# Patient Record
Sex: Male | Born: 1965 | Race: White | Hispanic: Yes | Marital: Married | Smoking: Former smoker
Health system: Southern US, Community
[De-identification: ages and names within clinical notes are randomized; demographics above are authoritative.]

## PROBLEM LIST (undated history)

## (undated) DIAGNOSIS — I2510 Atherosclerotic heart disease of native coronary artery without angina pectoris: Secondary | ICD-10-CM

## (undated) DIAGNOSIS — F3181 Bipolar II disorder: Secondary | ICD-10-CM

## (undated) DIAGNOSIS — F32A Depression, unspecified: Secondary | ICD-10-CM

## (undated) DIAGNOSIS — K760 Fatty (change of) liver, not elsewhere classified: Secondary | ICD-10-CM

## (undated) DIAGNOSIS — J45909 Unspecified asthma, uncomplicated: Secondary | ICD-10-CM

## (undated) DIAGNOSIS — I252 Old myocardial infarction: Secondary | ICD-10-CM

## (undated) DIAGNOSIS — I213 ST elevation (STEMI) myocardial infarction of unspecified site: Secondary | ICD-10-CM

## (undated) DIAGNOSIS — F419 Anxiety disorder, unspecified: Secondary | ICD-10-CM

## (undated) DIAGNOSIS — E119 Type 2 diabetes mellitus without complications: Secondary | ICD-10-CM

## (undated) HISTORY — PX: APPENDECTOMY: SHX54

## (undated) HISTORY — PX: NASAL SINUS SURGERY: SHX719

## (undated) HISTORY — DX: Fatty (change of) liver, not elsewhere classified: K76.0

## (undated) HISTORY — DX: ST elevation (STEMI) myocardial infarction of unspecified site: I21.3

## (undated) HISTORY — DX: Type 2 diabetes mellitus without complications: E11.9

## (undated) HISTORY — DX: Bipolar II disorder: F31.81

## (undated) HISTORY — PX: CORONARY ANGIOPLASTY: SHX604

## (undated) HISTORY — DX: Anxiety disorder, unspecified: F41.9

## (undated) HISTORY — DX: Old myocardial infarction: I25.2

## (undated) HISTORY — PX: FINGER SURGERY: SHX640

## (undated) HISTORY — DX: Atherosclerotic heart disease of native coronary artery without angina pectoris: I25.10

## (undated) HISTORY — DX: Unspecified asthma, uncomplicated: J45.909

---

## 2004-10-17 ENCOUNTER — Ambulatory Visit: Payer: Self-pay | Admitting: Family Medicine

## 2005-06-10 ENCOUNTER — Emergency Department: Payer: Self-pay | Admitting: Emergency Medicine

## 2005-06-16 ENCOUNTER — Ambulatory Visit: Payer: Self-pay

## 2005-08-20 ENCOUNTER — Other Ambulatory Visit: Payer: Self-pay

## 2005-08-20 ENCOUNTER — Emergency Department: Payer: Self-pay | Admitting: General Practice

## 2006-03-07 ENCOUNTER — Ambulatory Visit: Payer: Self-pay | Admitting: Gastroenterology

## 2007-08-06 ENCOUNTER — Other Ambulatory Visit: Payer: Self-pay

## 2007-08-06 ENCOUNTER — Observation Stay: Payer: Self-pay | Admitting: Internal Medicine

## 2007-10-17 ENCOUNTER — Ambulatory Visit: Payer: Self-pay | Admitting: Otolaryngology

## 2008-02-13 ENCOUNTER — Ambulatory Visit: Payer: Self-pay | Admitting: Family Medicine

## 2008-05-28 ENCOUNTER — Emergency Department: Payer: Self-pay | Admitting: Emergency Medicine

## 2008-06-18 DIAGNOSIS — J309 Allergic rhinitis, unspecified: Secondary | ICD-10-CM | POA: Insufficient documentation

## 2009-01-29 ENCOUNTER — Ambulatory Visit: Payer: Self-pay | Admitting: Family Medicine

## 2013-03-14 LAB — LIPID PANEL
Cholesterol: 248 mg/dL — AB (ref 0–200)
HDL: 38 mg/dL (ref 35–70)
LDL Cholesterol: 160 mg/dL
Triglycerides: 249 mg/dL — AB (ref 40–160)

## 2013-03-14 LAB — BASIC METABOLIC PANEL
BUN: 10 mg/dL (ref 4–21)
Creatinine: 0.9 mg/dL (ref 0.6–1.3)
Glucose: 110 mg/dL
Potassium: 4.7 mmol/L (ref 3.4–5.3)
Sodium: 143 mmol/L (ref 137–147)

## 2013-03-14 LAB — CBC AND DIFFERENTIAL
HCT: 49 % (ref 41–53)
Hemoglobin: 16.8 g/dL (ref 13.5–17.5)
Platelets: 269 10*3/uL (ref 150–399)
WBC: 9.1 10^3/mL

## 2013-03-14 LAB — HEPATIC FUNCTION PANEL
ALT: 14 U/L (ref 10–40)
AST: 14 U/L (ref 14–40)

## 2013-03-14 LAB — TSH: TSH: 1.85 u[IU]/mL (ref 0.41–5.90)

## 2013-06-22 DIAGNOSIS — I213 ST elevation (STEMI) myocardial infarction of unspecified site: Secondary | ICD-10-CM | POA: Insufficient documentation

## 2013-06-22 HISTORY — DX: ST elevation (STEMI) myocardial infarction of unspecified site: I21.3

## 2013-06-24 HISTORY — PX: CARDIAC CATHETERIZATION: SHX172

## 2013-07-07 ENCOUNTER — Ambulatory Visit (INDEPENDENT_AMBULATORY_CARE_PROVIDER_SITE_OTHER): Payer: BC Managed Care – PPO | Admitting: Cardiovascular Disease

## 2013-07-07 ENCOUNTER — Encounter (INDEPENDENT_AMBULATORY_CARE_PROVIDER_SITE_OTHER): Payer: Self-pay

## 2013-07-07 ENCOUNTER — Encounter: Payer: Self-pay | Admitting: Cardiovascular Disease

## 2013-07-07 VITALS — BP 115/72 | HR 66 | Ht 68.0 in | Wt 182.5 lb

## 2013-07-07 DIAGNOSIS — I10 Essential (primary) hypertension: Secondary | ICD-10-CM

## 2013-07-07 DIAGNOSIS — I2510 Atherosclerotic heart disease of native coronary artery without angina pectoris: Secondary | ICD-10-CM | POA: Insufficient documentation

## 2013-07-07 NOTE — Assessment & Plan Note (Signed)
Vincent Hall presents for second opinion regarding his coronary artery disease. He has had 2 cardiac catheterizations at [facility]. He has a rare metal stent and his proximal LAD. An attempt to stent his anomalous left complex artery was aborted when it appeared that they could not successfully stent the circumflex artery without jailing the right coronary artery.  It was recommended that he have coronary artery bypass grafting. He presents here for further evaluation and to seek a second opinion. Unfortunately, he did not bring his angiograms so we have no way to look at the films.  In my opinion I think he's doing very good hands. He's had several cardiologist and surgeon evaluate his situation. I think it we group in now with a second opinion would only serve to delay things. I've recommended he proceed with coronary artery bypass grafting. I'll be happy to see him following his surgical procedure.  He does have high cholesterol and will need followup for that.  He also continues to smoke some. I've strongly recommended that he stop smoking

## 2013-07-07 NOTE — Patient Instructions (Signed)
Your physician recommends that you schedule a follow-up appointment in: 2-3 months   Your physician recommends that you continue on your current medications as directed. Please refer to the Current Medication list given to you today.   

## 2013-07-07 NOTE — Progress Notes (Signed)
Perdido. Date of Birth  1966/03/02       [facility] Office [address], [place], [place]  [address]   [place], [place]  [address] [place]   Fax  [phone]     Fax [phone]  Problem List: 1. Coronary artery disease-status post Anterior  wall myocardial infarction  , status post bare-metal stenting of the LAD ,  CABG was recommneded .   2. hyperlipidemia 3. 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 MI on 3/15.   He originally thought that he had GERD.  The pain worsened and EMS took him to [facility].   He was found to have a critically tight LAD and moderate disease of the LCx.   The docors at [facility] had recommended CABG.       He has had some angina since his discharge.    Two days ago, he had recurrent angina, and was taken back to [facility].  Enzymes are negative.  ECG was normal.  He was release yesterday ( July 06, 2013).    He has appointments to see cardiology and also to the see CV surgeons in 2 weeks.    He has not been active.  He does walk on occasion.    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     Current Outpatient Prescriptions on File Prior to Visit  Medication Sig Dispense Refill  . albuterol (PROVENTIL HFA;VENTOLIN HFA) 108 (90 BASE) MCG/ACT inhaler Inhale 2 puffs into the lungs every 6 (six) hours as needed for wheezing or shortness of breath.      Marland Kitchen aspirin 81 MG tablet Take 81 mg by mouth daily.      Marland Kitchen atorvastatin (LIPITOR) 80 MG tablet Take 80 mg by mouth daily.      Marland Kitchen lisinopril (PRINIVIL,ZESTRIL) 2.5 MG tablet Take 2.5 mg by mouth daily.      . metoprolol succinate (TOPROL-XL) 25 MG 24 hr tablet Take 25 mg by mouth daily.      . nitroGLYCERIN 2.5 MG CR capsule Take 2.5 mg by mouth as needed.       Marland Kitchen omeprazole (PRILOSEC) 20  MG capsule Take 20 mg by mouth 2 (two) times daily before a meal.       . prasugrel (EFFIENT) 10 MG TABS tablet Take 10 mg by mouth daily.       No current facility-administered medications on file prior to visit.    No Known Allergies  Past Medical History  Diagnosis Date  . Coronary artery disease   . History of MI (myocardial infarction)   . GERD (gastroesophageal reflux disease)   . Hyperlipidemia     Past Surgical History  Procedure Laterality Date  . Nasal sinus surgery    . Appendectomy    . Cardiac catheterization  06/24/2013  . Coronary angioplasty      stent placement to LAD  . Finger surgery      History  Smoking status  . Current Every Day Smoker -- 0.25 packs/day for 30 years  . Types: Cigarettes  Smokeless tobacco  . Not on file    History  Alcohol Use No    Family History  Problem Relation Age of Onset  . Hyperlipidemia  Mother   . Heart attack Father   . Hyperlipidemia Father     Reviw of Systems:  Reviewed in the HPI.  All other systems are negative.  Physical Exam: Blood pressure 115/72, pulse 66, height 5\' 8"  (1.727 m), weight 182 lb 8 oz (82.781 kg). Wt Readings from Last 3 Encounters:  07/07/13 182 lb 8 oz (82.781 kg)     General: Well developed, well nourished, in no acute distress.  Head: Normocephalic, atraumatic, sclera non-icteric, mucus membranes are moist,   Neck: Supple. Carotids are 2 + without bruits. No JVD   Lungs: Clear   Heart: RR. Normal  s1S2  Abdomen: Soft, non-tender, non-distended with normal bowel sounds.  Msk:  Strength and tone are normal   Extremities: No clubbing or cyanosis. No edema.  Distal pedal pulses are 2+ and equal    Neuro: CN II - XII intact.  Alert and oriented X 3.   Psych:  Normal   ECG: 07/07/2013: Normal sinus rhythm at 66. His T-wave inversions in the inferior and lateral leads.  Assessment / Plan:

## 2013-07-31 HISTORY — PX: CORONARY ARTERY BYPASS GRAFT: SHX141

## 2013-08-22 ENCOUNTER — Ambulatory Visit: Payer: Self-pay | Admitting: Family Medicine

## 2013-09-11 ENCOUNTER — Ambulatory Visit: Payer: Self-pay | Admitting: Gastroenterology

## 2013-09-16 ENCOUNTER — Ambulatory Visit: Payer: BC Managed Care – PPO | Admitting: Cardiovascular Disease

## 2013-09-16 LAB — BASIC METABOLIC PANEL
BUN: 13 mg/dL (ref 4–21)
Creatinine: 0.8 mg/dL (ref 0.6–1.3)
Glucose: 102 mg/dL
Potassium: 4.8 mmol/L (ref 3.4–5.3)
Sodium: 140 mmol/L (ref 137–147)

## 2013-09-16 LAB — HEPATIC FUNCTION PANEL
ALT: 27 U/L (ref 10–40)
AST: 19 U/L (ref 14–40)

## 2013-09-16 LAB — CBC AND DIFFERENTIAL
HCT: 43 % (ref 41–53)
Hemoglobin: 14 g/dL (ref 13.5–17.5)
Platelets: 275 10*3/uL (ref 150–399)
WBC: 9.2 10^3/mL

## 2013-09-16 LAB — LIPID PANEL
Cholesterol: 180 mg/dL (ref 0–200)
HDL: 49 mg/dL (ref 35–70)
LDL Cholesterol: 93 mg/dL
Triglycerides: 191 mg/dL — AB (ref 40–160)

## 2013-09-29 ENCOUNTER — Ambulatory Visit: Payer: Self-pay | Admitting: Urgent Care

## 2014-02-06 ENCOUNTER — Ambulatory Visit: Payer: Self-pay | Admitting: Unknown Physician Specialty

## 2014-02-06 ENCOUNTER — Ambulatory Visit: Payer: Self-pay | Admitting: Urgent Care

## 2014-02-06 LAB — HEPATIC FUNCTION PANEL A (ARMC)
Albumin: 4.1 g/dL (ref 3.4–5.0)
Alkaline Phosphatase: 68 U/L
Bilirubin, Direct: <0.1 mg/dL (ref 0.0–0.2)
Bilirubin,Total: 0.4 mg/dL (ref 0.2–1.0)
SGOT(AST): 19 U/L (ref 15–37)
SGPT (ALT): 42 U/L
Total Protein: 7.2 g/dL (ref 6.4–8.2)

## 2014-02-17 ENCOUNTER — Ambulatory Visit: Payer: Self-pay | Admitting: Gastroenterology

## 2014-02-18 ENCOUNTER — Ambulatory Visit: Payer: Self-pay | Admitting: Family Medicine

## 2014-02-19 ENCOUNTER — Ambulatory Visit: Payer: Self-pay | Admitting: Family Medicine

## 2014-07-02 LAB — LIPID PANEL
Cholesterol: 178 mg/dL (ref 0–200)
HDL: 45 mg/dL (ref 35–70)
LDL Cholesterol: 84 mg/dL
Triglycerides: 244 mg/dL — AB (ref 40–160)

## 2014-07-02 LAB — BASIC METABOLIC PANEL
BUN: 12 mg/dL (ref 4–21)
Creatinine: 0.9 mg/dL (ref 0.6–1.3)
Glucose: 121 mg/dL
Potassium: 4.2 mmol/L (ref 3.4–5.3)
Sodium: 141 mmol/L (ref 137–147)

## 2014-07-02 LAB — CBC AND DIFFERENTIAL
HCT: 44 % (ref 41–53)
Hemoglobin: 14.9 g/dL (ref 13.5–17.5)
Platelets: 203 10*3/uL (ref 150–399)
WBC: 7.9 10^3/mL

## 2014-07-02 LAB — TSH: TSH: 2.31 u[IU]/mL (ref 0.41–5.90)

## 2014-07-02 LAB — HEPATIC FUNCTION PANEL
ALT: 27 U/L (ref 10–40)
AST: 16 U/L (ref 14–40)

## 2014-07-02 LAB — PSA: PSA: 0.2

## 2014-08-18 ENCOUNTER — Ambulatory Visit: Payer: BLUE CROSS/BLUE SHIELD | Attending: Family Medicine

## 2014-09-18 ENCOUNTER — Ambulatory Visit (INDEPENDENT_AMBULATORY_CARE_PROVIDER_SITE_OTHER): Payer: Commercial Managed Care - HMO | Admitting: Urgent Care

## 2014-09-18 ENCOUNTER — Other Ambulatory Visit: Payer: Self-pay | Admitting: Urgent Care

## 2014-09-18 ENCOUNTER — Encounter: Payer: Self-pay | Admitting: Urgent Care

## 2014-09-18 ENCOUNTER — Telehealth: Payer: Self-pay | Admitting: Family Medicine

## 2014-09-18 VITALS — BP 146/70 | HR 85 | Temp 98.1°F | Ht 67.0 in | Wt 197.4 lb

## 2014-09-18 DIAGNOSIS — K224 Dyskinesia of esophagus: Secondary | ICD-10-CM | POA: Insufficient documentation

## 2014-09-18 DIAGNOSIS — Z1211 Encounter for screening for malignant neoplasm of colon: Secondary | ICD-10-CM | POA: Diagnosis not present

## 2014-09-18 DIAGNOSIS — K5904 Chronic idiopathic constipation: Secondary | ICD-10-CM | POA: Insufficient documentation

## 2014-09-18 DIAGNOSIS — K5900 Constipation, unspecified: Secondary | ICD-10-CM | POA: Diagnosis not present

## 2014-09-18 DIAGNOSIS — K5909 Other constipation: Secondary | ICD-10-CM

## 2014-09-18 DIAGNOSIS — K219 Gastro-esophageal reflux disease without esophagitis: Secondary | ICD-10-CM | POA: Insufficient documentation

## 2014-09-18 MED ORDER — OXYCODONE-ACETAMINOPHEN 5-325 MG PO TABS: ORAL_TABLET | ORAL | Status: DC

## 2014-09-18 MED ORDER — DEXILANT 60 MG PO CPDR: 60.0000 mg | DELAYED_RELEASE_CAPSULE | Freq: Every day | ORAL | Status: DC

## 2014-09-18 MED ORDER — NA SULFATE-K SULFATE-MG SULF 17.5-3.13-1.6 GM/177ML PO SOLN: 1.0000 | ORAL | Status: DC

## 2014-09-18 MED ORDER — ALPRAZOLAM 0.5 MG PO TABS: ORAL_TABLET | ORAL | Status: DC

## 2014-09-18 NOTE — Progress Notes (Signed)
Gastroenterology Initial Patient Visit  Referring Provider:     Birdie Sons, MD Primary Care Physician:  [name], MD Primary Gastroenterologist:  Dr. Allen Norris          HPI:   Vincent Hall is a 49 y.o. y/o male established patient with GERD & esophageal spasms.  He has stopped taking his baclofen area he can tell me exactly how long ago he stopped. He is continue to take Decadron 60 mg daily. He is taking occasional oxycodone for pain. He says his swallowing is much better. However when he eats he is having bloating. Denies any severe abdominal pain. Denies any nausea, vomiting, or diarrhea. He is busy had negative gallbladder workup including ultrasound and HIDA scan. He has tried Linzess under 45 mg daily for his constipation and this does seem to help. He's never had a colonoscopy.  Past Medical History  Diagnosis Date  . Coronary artery disease   . History of MI (myocardial infarction)   . GERD (gastroesophageal reflux disease)   . Hyperlipidemia   . Dysphagia   . Epigastric pain   . Insomnia   . Arthralgia   . Fatty liver   . Diffuse esophageal spasm   . Asthma   . Coronary artery disease   . Myocardial infarction   . Anxiety     Past Surgical History  Procedure Laterality Date  . Nasal sinus surgery    . Appendectomy    . Cardiac catheterization  06/24/2013  . Coronary angioplasty      stent placement to LAD  . Finger surgery    . Coronary artery bypass graft  07/31/2013    x 3 vessels    Prior to Admission medications   Medication Sig Start Date End Date Taking? Authorizing Provider  albuterol (PROVENTIL HFA;VENTOLIN HFA) 108 (90 BASE) MCG/ACT inhaler Inhale 2 puffs into the lungs every 6 (six) hours as needed for wheezing or shortness of breath.   Yes Historical Provider, MD  aspirin 81 MG tablet Take 81 mg by mouth daily.   Yes Historical Provider, MD  CRESTOR 20 MG tablet Take 1 tablet by mouth daily. 09/06/14  Yes Historical Provider, MD  DEXILANT 60  MG capsule Take 1 capsule (60 mg total) by mouth daily. 09/18/14  Yes Andria Meuse, NP  lisinopril (PRINIVIL,ZESTRIL) 2.5 MG tablet Take 2.5 mg by mouth daily.   Yes Historical Provider, MD  metoprolol succinate (TOPROL-XL) 25 MG 24 hr tablet Take 25 mg by mouth daily.   Yes Historical Provider, MD  nitroGLYCERIN 2.5 MG CR capsule Take 2.5 mg by mouth as needed.    Yes Historical Provider, MD  oxyCODONE-acetaminophen (ROXICET) 5-325 MG per tablet 1/2 - 1 tablet twice daily as needed 09/18/14  Yes Birdie Sons, MD  Na Sulfate-K Sulfate-Mg Sulf (SUPREP BOWEL PREP) SOLN Take 1 kit by mouth as directed. 09/18/14   Andria Meuse, NP    Family History  Problem Relation Age of Onset  . Hyperlipidemia Mother   . Heart attack Father   . Hyperlipidemia Father      History   Social History Narrative   History  Substance Use Topics  . Smoking status: Current Every Day Smoker -- 0.25 packs/day for 30 years    Types: Cigarettes  . Smokeless tobacco: Not on file  . Alcohol Use: No    Allergies as of 09/18/2014 - Review Complete 09/18/2014  Allergen Reaction Noted  . Singulair [montelukast sodium]  09/18/2014  Review of Systems:    All systems reviewed and negative except where noted in HPI.   Physical Exam:  BP 146/70 mmHg  Pulse 85  Temp(Src) 98.1 F (36.7 C) (Oral)  Ht '5\' 7"'  (1.702 m)  Wt 197 lb 6.4 oz (89.54 kg)  BMI 30.91 kg/m2 No LMP for male patient. General:   Alert,  Well-developed, well-nourished, pleasant and cooperative in NAD Head:  Normocephalic and atraumatic. Eyes:  Sclera clear, no icterus.   Conjunctiva pink. Ears:  Normal auditory acuity. Nose:  No deformity, discharge, or lesions. Mouth:  No deformity or lesions,oropharynx pink & moist. Neck:  Supple; no masses or thyromegaly. Lungs:  Respirations even and unlabored.  Clear throughout to auscultation.   No wheezes, crackles, or rhonchi. No acute distress. Heart:  Regular rate and rhythm; no murmurs,  clicks, rubs, or gallops. Abdomen:  Normal bowel sounds.  No bruits.  Soft, non-tender and non-distended without masses, hepatosplenomegaly or hernias noted.  No guarding or rebound tenderness.  Negative Carnett sign.   Rectal:  Deferred.  Msk:  Symmetrical without gross deformities.  Good, equal movement & strength bilaterally. Pulses:  Normal pulses noted. Extremities:  No clubbing or edema.  No cyanosis. Neurologic:  Alert and oriented x3;  grossly normal neurologically. Skin:  Intact without significant lesions or rashes.  No jaundice. Lymph Nodes:  No significant cervical adenopathy. Psych:  Alert and cooperative. Normal mood and affect.  Imaging Studies: No results found.

## 2014-09-18 NOTE — Telephone Encounter (Signed)
Please refill.

## 2014-09-18 NOTE — Assessment & Plan Note (Signed)
Continue Dexilant 60 mg daily 

## 2014-09-18 NOTE — Telephone Encounter (Signed)
Rx printed

## 2014-09-18 NOTE — Assessment & Plan Note (Addendum)
I have discussed risks & benefits which include, but are not limited to, bleeding, infection, perforation & drug reaction.  The patient agrees with this plan & written consent will be obtained. Begin probiotic of choice VSL#3 twice daily or ALIGN 4mg  daily

## 2014-09-18 NOTE — Telephone Encounter (Signed)
Pt request the refill for Alprazolam .5 mg be sent to [facility] and would like to pick up the RX for Oxycodone 5-325 mg. Thanks TNP

## 2014-09-18 NOTE — Assessment & Plan Note (Signed)
Resume Amitiza 180mcg daily

## 2014-09-18 NOTE — Assessment & Plan Note (Signed)
Resume baclofen as directed

## 2014-09-18 NOTE — Patient Instructions (Signed)
Colonoscopy with Dr Allen Norris Resume Baclofen as directed Begin probiotic of choice VSL#3 twice daily or ALIGN 4mg  daily Continue Dexilant 60mg  daily Esophageal Spasm Esophageal spasm is an uncoordinated contraction of the muscles of the esophagus (the tube which carries food from your mouth to your stomach). Normally, the muscles of the esophagus alternate between contraction and relaxation starting from the top of the esophagus and working down to the bottom. This moves the food from the mouth to the stomach. In esophageal spasm, all the muscles contract at once. This causes pain and fails to move the food along. As a result, you may have trouble swallowing.  Women are more likely than men to have esophageal spasm. The cause of the spasms is not known. Sometimes eating hot or cold foods triggers the condition and this may be due to an overly sensitive esophagus. This is not an infectious disease and cannot be passed to others. SYMPTOMS  Symptoms of esophageal spasm may include: chest pain, burning or pain with swallowing, and difficulty swallowing.  DIAGNOSIS  Esophageal spasm can be diagnosed by a test called manometry (pressure studies of the esophagus). In this test, a special tube is inserted down the esophagus. The tube measures the muscle activity of the esophagus. Abnormal contractions mixed with normal movement helps confirm the diagnosis.  A person with a hypersensitive esophagus may be diagnosed by inflating a long balloon in the person's esophagus. If this causes the same symptoms, preventive methods may work. PREVENTION  Avoid hot or cold foods if that seems to be a trigger. PROGNOSIS  This condition does not go away, nor is treatment entirely satisfactory. Patients need to be careful of what they eat. They need to continue on medication if a useful one is found. Fortunately, the condition does not get progressively worse as time passes. Esophageal spasm does not usually lead to more serious  problems but sometimes the pain can be disabling. If a person becomes afraid to eat they may become malnourished and lose weight.  TREATMENT   A procedure in which instruments of increasing size are inserted through the esophagus to enlarge (dilate) it are used.  Medications that decrease acid-production of the stomach may be used such as proton-pump inhibitors or H2-blockers.  Medications of several types can be used to relax the muscles of the esophagus.  An individual with a hypersensitive esophagus sometimes improves with low doses of medications normally used for depression.  No treatment for esophageal spasm is effective for everyone. Often several approaches will be tried before one works. In many cases, the symptoms will improve, but will not go away completely.  For severe cases, relief is obtained two-thirds of the time by cutting the muscles along the entire length of the esophagus. This is a major surgical procedure.  Your symptoms are usually the best guide to how well the treatment for esophageal spasm works. SIDE EFFECTS OF TREATMENTS  Nitrates can cause headaches and low blood pressure.  Calcium channel blockers can cause:  Feeling sick to your stomach (nausea).  Constipation and other side effects.  Antidepressants can cause side effects that depend on the medication used. HOME CARE INSTRUCTIONS   Let your caregiver know if problems are getting worse, or if you get food stuck in your esophagus for longer than 1 hour or as directed and are unable to swallow liquid.  Take medications as directed and with permission of your caregiver. Ask about what to do if a medication seems to get stuck in  your esophagus. Only take over-the-counter or prescription medicines for pain, discomfort, or fever as directed by your caregiver.  Soft and liquid foods pass more easily than solid pieces. SEEK IMMEDIATE MEDICAL CARE IF:   You develop severe chest pain, especially if the pain  is crushing or pressure-like and spreads to the arms, back, neck, or jaw, or if you have sweating, nausea, or shortness of breath. THIS COULD BE AN EMERGENCY. Do not wait to see if the pain will go away. Get medical help at once. Call 911 or 0 (operator). DO NOT drive yourself to the hospital.  Your chest pain gets worse and does not go away with rest.  You have an attack of chest pain lasting longer than usual despite rest and treatment with the medications your physician has prescribed.  You wake from sleep with chest pain or shortness of breath.  You feel dizzy or faint.  You have chest pain, not typical of your usual pain, caused by your esophagus for which you originally saw your caregiver. MAKE SURE YOU:   Understand these instructions.  Will watch your condition.  Will get help right away if you are not doing well or get worse. Document Released: 06/17/2002 Document Revised: 06/19/2011 Document Reviewed: 06/20/2013 [facility] Patient Information 2015 [place], [place]. This information is not intended to replace advice given to you by your health care provider. Make sure you discuss any questions you have with your health care provider.

## 2014-09-18 NOTE — Progress Notes (Deleted)
Subjective:     Patient ID: Vincent Hall, male   DOB: December 13, 1965, 49 y.o.   MRN: [id number]  HPI   Review of Systems     Objective:   Physical Exam     Assessment:     ***    Plan:     ***

## 2014-09-22 NOTE — Discharge Instructions (Signed)

## 2014-09-23 ENCOUNTER — Ambulatory Visit: Payer: Commercial Managed Care - HMO | Admitting: Anesthesiology

## 2014-09-23 ENCOUNTER — Ambulatory Visit
Admission: RE | Admit: 2014-09-23 | Discharge: 2014-09-23 | Disposition: A | Discharge status: Home / Self Care | Payer: Commercial Managed Care - HMO | Source: Ambulatory Visit | Attending: Gastroenterology | Admitting: Gastroenterology

## 2014-09-23 ENCOUNTER — Encounter
Admission: RE | Disposition: A | Discharge status: Home / Self Care | Payer: Self-pay | Source: Ambulatory Visit | Attending: Gastroenterology

## 2014-09-23 ENCOUNTER — Other Ambulatory Visit: Payer: Self-pay | Admitting: Gastroenterology

## 2014-09-23 DIAGNOSIS — E785 Hyperlipidemia, unspecified: Secondary | ICD-10-CM | POA: Diagnosis not present

## 2014-09-23 DIAGNOSIS — Z8489 Family history of other specified conditions: Secondary | ICD-10-CM | POA: Insufficient documentation

## 2014-09-23 DIAGNOSIS — K760 Fatty (change of) liver, not elsewhere classified: Secondary | ICD-10-CM | POA: Insufficient documentation

## 2014-09-23 DIAGNOSIS — Z79899 Other long term (current) drug therapy: Secondary | ICD-10-CM | POA: Diagnosis not present

## 2014-09-23 DIAGNOSIS — K640 First degree hemorrhoids: Secondary | ICD-10-CM | POA: Diagnosis not present

## 2014-09-23 DIAGNOSIS — J45909 Unspecified asthma, uncomplicated: Secondary | ICD-10-CM | POA: Insufficient documentation

## 2014-09-23 DIAGNOSIS — Z888 Allergy status to other drugs, medicaments and biological substances status: Secondary | ICD-10-CM | POA: Insufficient documentation

## 2014-09-23 DIAGNOSIS — Z8249 Family history of ischemic heart disease and other diseases of the circulatory system: Secondary | ICD-10-CM | POA: Diagnosis not present

## 2014-09-23 DIAGNOSIS — Z87891 Personal history of nicotine dependence: Secondary | ICD-10-CM | POA: Diagnosis not present

## 2014-09-23 DIAGNOSIS — I2510 Atherosclerotic heart disease of native coronary artery without angina pectoris: Secondary | ICD-10-CM | POA: Diagnosis not present

## 2014-09-23 DIAGNOSIS — D123 Benign neoplasm of transverse colon: Secondary | ICD-10-CM | POA: Diagnosis not present

## 2014-09-23 DIAGNOSIS — K219 Gastro-esophageal reflux disease without esophagitis: Secondary | ICD-10-CM | POA: Diagnosis not present

## 2014-09-23 DIAGNOSIS — I252 Old myocardial infarction: Secondary | ICD-10-CM | POA: Insufficient documentation

## 2014-09-23 DIAGNOSIS — F419 Anxiety disorder, unspecified: Secondary | ICD-10-CM | POA: Insufficient documentation

## 2014-09-23 DIAGNOSIS — Z1211 Encounter for screening for malignant neoplasm of colon: Secondary | ICD-10-CM | POA: Diagnosis not present

## 2014-09-23 DIAGNOSIS — D125 Benign neoplasm of sigmoid colon: Secondary | ICD-10-CM | POA: Diagnosis not present

## 2014-09-23 HISTORY — PX: COLONOSCOPY WITH PROPOFOL: SHX5780

## 2014-09-23 HISTORY — PX: POLYPECTOMY: SHX5525

## 2014-09-23 SURGERY — COLONOSCOPY WITH PROPOFOL
Anesthesia: Monitor Anesthesia Care | Wound class: Contaminated

## 2014-09-23 MED ORDER — LACTATED RINGERS IV SOLN
INTRAVENOUS | Status: DC
  Administered 2014-09-23 (×2): via INTRAVENOUS

## 2014-09-23 MED ORDER — PROMETHAZINE HCL 25 MG/ML IJ SOLN: 6.2500 mg | INTRAMUSCULAR | Status: DC | PRN

## 2014-09-23 MED ORDER — LIDOCAINE HCL (CARDIAC) 20 MG/ML IV SOLN
INTRAVENOUS | Status: DC | PRN
  Administered 2014-09-23: 30 mg via INTRAVENOUS

## 2014-09-23 MED ORDER — SODIUM CHLORIDE 0.9 % IV SOLN
INTRAVENOUS | Status: DC
Start: 2014-09-23 — End: 2014-09-23

## 2014-09-23 MED ORDER — ONDANSETRON HCL 4 MG PO TABS
4.0000 mg | ORAL_TABLET | Freq: Once | ORAL | Status: AC
  Administered 2014-09-23: 4 mg via ORAL

## 2014-09-23 MED ORDER — PROPOFOL 10 MG/ML IV BOLUS
INTRAVENOUS | Status: DC | PRN
  Administered 2014-09-23 (×9): 20 mg via INTRAVENOUS

## 2014-09-23 MED ORDER — STERILE WATER FOR IRRIGATION IR SOLN
Status: DC | PRN
  Administered 2014-09-23: 08:00:00

## 2014-09-23 SURGICAL SUPPLY — 28 items

## 2014-09-23 NOTE — Transfer of Care (Signed)
Immediate Anesthesia Transfer of Care Note  Patient: Vincent Hall  Procedure(s) Performed: Procedure(s): COLONOSCOPY WITH PROPOFOL (N/A) POLYPECTOMY  Patient Location: PACU  Anesthesia Type: MAC  Level of Consciousness: awake, alert  and patient cooperative  Airway and Oxygen Therapy: Patient Spontanous Breathing and Patient connected to supplemental oxygen  Post-op Assessment: Post-op Vital signs reviewed, Patient's Cardiovascular Status Stable, Respiratory Function Stable, Patent Airway and No signs of Nausea or vomiting  Post-op Vital Signs: Reviewed and stable  Complications: No apparent anesthesia complications

## 2014-09-23 NOTE — Anesthesia Postprocedure Evaluation (Signed)
  Anesthesia Post-op Note  Patient: Vincent Hall, Vincent Hall  Procedure(s) Performed: Procedure(s): COLONOSCOPY WITH PROPOFOL (N/A) POLYPECTOMY  Anesthesia type:MAC  Patient location: PACU  Post pain: Pain level controlled  Post assessment: Post-op Vital signs reviewed, Patient's Cardiovascular Status Stable, Respiratory Function Stable, Patent Airway and No signs of Nausea or vomiting  Post vital signs: Reviewed and stable  Last Vitals:  Filed Vitals:   09/23/14 0915  BP: 123/83  Pulse: 60  Temp:   Resp:     Level of consciousness: awake, alert  and patient cooperative  Complications: No apparent anesthesia complications

## 2014-09-23 NOTE — H&P (Signed)
[facility]  [address], [place], [place] [address] Phone: [phone] Fax : [phone]  Primary Care Physician:  [name], MD Primary Gastroenterologist:  Dr. Allen Norris  Pre-Procedure History & Physical: HPI:  Vincent Hall is a 49 y.o. male is here for a screening colonoscopy.   Past Medical History  Diagnosis Date  . Coronary artery disease   . History of MI (myocardial infarction)   . GERD (gastroesophageal reflux disease)   . Hyperlipidemia   . Dysphagia   . Epigastric pain   . Insomnia   . Arthralgia   . Fatty liver   . Diffuse esophageal spasm   . Asthma   . Coronary artery disease   . Myocardial infarction   . Anxiety     Past Surgical History  Procedure Laterality Date  . Nasal sinus surgery    . Appendectomy    . Cardiac catheterization  06/24/2013  . Coronary angioplasty      stent placement to LAD  . Finger surgery    . Coronary artery bypass graft  07/31/2013    x 3 vessels    Prior to Admission medications   Medication Sig Start Date End Date Taking? Authorizing Provider  albuterol (PROVENTIL HFA;VENTOLIN HFA) 108 (90 BASE) MCG/ACT inhaler Inhale 2 puffs into the lungs every 6 (six) hours as needed for wheezing or shortness of breath.   Yes Historical Provider, MD  ALPRAZolam Duanne Moron) 0.5 MG tablet Take 0.5 mg by mouth as needed for anxiety.   Yes Historical Provider, MD  CRESTOR 20 MG tablet Take 1 tablet by mouth daily. 09/06/14  Yes Historical Provider, MD  DEXILANT 60 MG capsule Take 1 capsule (60 mg total) by mouth daily. 09/18/14  Yes Andria Meuse, NP  metoprolol succinate (TOPROL-XL) 25 MG 24 hr tablet Take 25 mg by mouth daily.   Yes Historical Provider, MD  Na Sulfate-K Sulfate-Mg Sulf (SUPREP BOWEL PREP) SOLN Take 1 kit by mouth as directed. 09/18/14  Yes Andria Meuse, NP  oxyCODONE-acetaminophen (ROXICET) 5-325 MG per tablet 1/2 - 1 tablet twice daily as needed 09/18/14  Yes Birdie Sons, MD  Probiotic Product  (SOLUBLE FIBER/PROBIOTICS PO) Take by mouth once.   Yes Historical Provider, MD  ranitidine (ZANTAC) 150 MG capsule Take 150 mg by mouth as needed for heartburn.   Yes Historical Provider, MD  aspirin 81 MG tablet Take 81 mg by mouth daily.    Historical Provider, MD  lisinopril (PRINIVIL,ZESTRIL) 2.5 MG tablet Take 2.5 mg by mouth daily.    Historical Provider, MD  nitroGLYCERIN 2.5 MG CR capsule Take 2.5 mg by mouth as needed.     Historical Provider, MD    Allergies as of 09/18/2014 - Review Complete 09/18/2014  Allergen Reaction Noted  . Singulair [montelukast sodium]  09/18/2014    Family History  Problem Relation Age of Onset  . Hyperlipidemia Mother   . Heart attack Father   . Hyperlipidemia Father     History   Social History  . Marital Status: Married    Spouse Name: N/A  . Number of Children: N/A  . Years of Education: N/A   Occupational History  . Not on file.   Social History Main Topics  . Smoking status: Former Smoker -- 0.25 packs/day for 30 years    Types: Cigarettes    Quit date: 08/08/2013  . Smokeless tobacco: Not on file  . Alcohol Use: No  . Drug Use: No  . Sexual Activity:  Not on file   Other Topics Concern  . Not on file   Social History Narrative    Review of Systems: See HPI, otherwise negative ROS  Physical Exam: BP 124/69 mmHg  Pulse 83  Temp(Src) 97.7 F (36.5 C) (Temporal)  Resp 16  Ht '5\' 7"'  (1.702 m)  Wt 193 lb (87.544 kg)  BMI 30.22 kg/m2  SpO2 97% General:   Alert,  pleasant and cooperative in NAD Head:  Normocephalic and atraumatic. Neck:  Supple; no masses or thyromegaly. Lungs:  Clear throughout to auscultation.    Heart:  Regular rate and rhythm. Abdomen:  Soft, nontender and nondistended. Normal bowel sounds, without guarding, and without rebound.   Neurologic:  Alert and  oriented x4;  grossly normal neurologically.  Impression/Plan: Vincent Hall is now here to undergo a screening colonoscopy.  Risks,  benefits, and alternatives regarding colonoscopy have been reviewed with the patient.  Questions have been answered.  All parties agreeable.

## 2014-09-23 NOTE — Anesthesia Preprocedure Evaluation (Signed)
Anesthesia Evaluation    Airway Mallampati: II  TM Distance: >3 FB Neck ROM: Full    Dental no notable dental hx.    Pulmonary asthma , former smoker,  No inhaler use recently breath sounds clear to auscultation  Pulmonary exam normal       Cardiovascular + CAD and + Past MI Normal cardiovascular examRhythm:Regular Rate:Normal  Mets 4+. No recent chest pain   Neuro/Psych Anxiety    GI/Hepatic GERD-  Controlled,  Endo/Other    Renal/GU      Musculoskeletal   Abdominal   Peds  Hematology   Anesthesia Other Findings   Reproductive/Obstetrics                             Anesthesia Physical Anesthesia Plan  ASA: II  Anesthesia Plan: MAC   Post-op Pain Management:    Induction: Intravenous  Airway Management Planned: Natural Airway  Additional Equipment:   Intra-op Plan:   Post-operative Plan: Extubation in OR  Informed Consent: I have reviewed the patients History and Physical, chart, labs and discussed the procedure including the risks, benefits and alternatives for the proposed anesthesia with the patient or authorized representative who has indicated his/her understanding and acceptance.   Dental advisory given  Plan Discussed with: CRNA  Anesthesia Plan Comments:         Anesthesia Quick Evaluation

## 2014-09-23 NOTE — Op Note (Signed)
[facility] Gastroenterology Patient Name: Vincent Hall Procedure Date: 09/23/2014 8:18 AM MRN: [id number] Account #: [id number] Date of Birth: 04/30/1965 Admit Type: Outpatient Age: 49 Room: [facility] OR ROOM 01 Gender: Male Note Status: Finalized Procedure:         Colonoscopy Indications:       Screening for colorectal malignant neoplasm Providers:         Lucilla Lame, MD Medicines:         Propofol per Anesthesia Complications:     No immediate complications. Procedure:         Pre-Anesthesia Assessment:                    - Prior to the procedure, a History and Physical was                     performed, and patient medications and allergies were                     reviewed. The patient's tolerance of previous anesthesia                     was also reviewed. The risks and benefits of the procedure                     and the sedation options and risks were discussed with the                     patient. All questions were answered, and informed consent                     was obtained. Prior Anticoagulants: The patient has taken                     no previous anticoagulant or antiplatelet agents. ASA                     Grade Assessment: II - A patient with mild systemic                     disease. After reviewing the risks and benefits, the                     patient was deemed in satisfactory condition to undergo                     the procedure.                    After obtaining informed consent, the colonoscope was                     passed under direct vision. Throughout the procedure, the                     patient's blood pressure, pulse, and oxygen saturations                     were monitored continuously. The Olympus CF H180AL                     colonoscope (S#: [id number]) was introduced through the anus                     and advanced to the the cecum, identified by  appendiceal                     orifice and ileocecal valve. The  colonoscopy was performed                     without difficulty. The patient tolerated the procedure                     well. The quality of the bowel preparation was excellent. Findings:      The perianal and digital rectal examinations were normal.      Three sessile polyps were found in the sigmoid colon. The polyps were 2       to 3 mm in size. These polyps were removed with a cold biopsy forceps.       Resection and retrieval were complete.      A 6 mm polyp was found in the transverse colon. The polyp was sessile.       The polyp was removed with a cold snare. Resection and retrieval were       complete.      Non-bleeding internal hemorrhoids were found during retroflexion. The       hemorrhoids were Grade I (internal hemorrhoids that do not prolapse). Impression:        - Three 2 to 3 mm polyps in the sigmoid colon. Resected                     and retrieved.                    - One 6 mm polyp in the transverse colon. Resected and                     retrieved.                    - Non-bleeding internal hemorrhoids. Recommendation:    - Await pathology results.                    - Repeat colonoscopy in 5 years if polyp adenoma and 10                     years if hyperplastic Procedure Code(s): --- Professional ---                    [phone], Colonoscopy, flexible; with removal of tumor(s),                     polyp(s), or other lesion(s) by snare technique                    [id number], 31, Colonoscopy, flexible; with biopsy, single or                     multiple Diagnosis Code(s): --- Professional ---                    Z12.11, Encounter for screening for malignant neoplasm of                     colon                    D12.5, Benign neoplasm of sigmoid colon  D12.3, Benign neoplasm of transverse colon CPT copyright 2014 American Medical Association. All rights reserved. The codes documented in this report are preliminary and upon coder review may  be revised  to meet current compliance requirements. Lucilla Lame, MD 09/23/2014 8:46:07 AM This report has been signed electronically. Number of Addenda: 0 Note Initiated On: 09/23/2014 8:18 AM Scope Withdrawal Time: 0 hours 6 minutes 52 seconds  Total Procedure Duration: 0 hours 16 minutes 18 seconds       [facility]

## 2014-09-24 ENCOUNTER — Encounter: Payer: Self-pay | Admitting: Gastroenterology

## 2014-10-06 ENCOUNTER — Encounter: Payer: Self-pay | Admitting: Gastroenterology

## 2014-10-20 ENCOUNTER — Telehealth: Payer: Self-pay | Admitting: Family Medicine

## 2014-10-20 NOTE — Telephone Encounter (Signed)
Pt called for his wife.  He wants her to re-establish care with [place].  He wants her to see you for a physical.  She was in in 2012 and seen Dr. Loletha Grayer.  Thanks, TP   His call back is [phone].

## 2014-10-28 ENCOUNTER — Ambulatory Visit (INDEPENDENT_AMBULATORY_CARE_PROVIDER_SITE_OTHER): Payer: Commercial Managed Care - HMO | Admitting: Family Medicine

## 2014-10-28 ENCOUNTER — Encounter: Payer: Self-pay | Admitting: *Deleted

## 2014-10-28 ENCOUNTER — Encounter: Payer: Self-pay | Admitting: Family Medicine

## 2014-10-28 VITALS — BP 104/64 | HR 60 | Temp 98.3°F | Resp 16 | Wt 197.0 lb

## 2014-10-28 DIAGNOSIS — I252 Old myocardial infarction: Secondary | ICD-10-CM | POA: Insufficient documentation

## 2014-10-28 DIAGNOSIS — F410 Panic disorder [episodic paroxysmal anxiety] without agoraphobia: Secondary | ICD-10-CM | POA: Diagnosis not present

## 2014-10-28 DIAGNOSIS — S29012A Strain of muscle and tendon of back wall of thorax, initial encounter: Secondary | ICD-10-CM | POA: Insufficient documentation

## 2014-10-28 DIAGNOSIS — J309 Allergic rhinitis, unspecified: Secondary | ICD-10-CM

## 2014-10-28 DIAGNOSIS — G4700 Insomnia, unspecified: Secondary | ICD-10-CM

## 2014-10-28 DIAGNOSIS — F419 Anxiety disorder, unspecified: Secondary | ICD-10-CM | POA: Insufficient documentation

## 2014-10-28 DIAGNOSIS — E782 Mixed hyperlipidemia: Secondary | ICD-10-CM | POA: Insufficient documentation

## 2014-10-28 DIAGNOSIS — K219 Gastro-esophageal reflux disease without esophagitis: Secondary | ICD-10-CM | POA: Diagnosis not present

## 2014-10-28 DIAGNOSIS — M79606 Pain in leg, unspecified: Secondary | ICD-10-CM | POA: Insufficient documentation

## 2014-10-28 DIAGNOSIS — R5383 Other fatigue: Secondary | ICD-10-CM | POA: Insufficient documentation

## 2014-10-28 DIAGNOSIS — R0789 Other chest pain: Secondary | ICD-10-CM | POA: Insufficient documentation

## 2014-10-28 DIAGNOSIS — Z792 Long term (current) use of antibiotics: Secondary | ICD-10-CM | POA: Insufficient documentation

## 2014-10-28 DIAGNOSIS — M542 Cervicalgia: Secondary | ICD-10-CM | POA: Insufficient documentation

## 2014-10-28 DIAGNOSIS — R1310 Dysphagia, unspecified: Secondary | ICD-10-CM | POA: Insufficient documentation

## 2014-10-28 DIAGNOSIS — Z87891 Personal history of nicotine dependence: Secondary | ICD-10-CM | POA: Insufficient documentation

## 2014-10-28 DIAGNOSIS — M2550 Pain in unspecified joint: Secondary | ICD-10-CM | POA: Insufficient documentation

## 2014-10-28 MED ORDER — ALPRAZOLAM 0.5 MG PO TABS
0.5000 mg | ORAL_TABLET | Freq: Two times a day (BID) | ORAL | Status: DC | PRN
Start: 2014-10-28 — End: 2015-01-24

## 2014-10-28 MED ORDER — SERTRALINE HCL 50 MG PO TABS: ORAL_TABLET | ORAL | Status: DC

## 2014-10-28 NOTE — Progress Notes (Signed)
Patient: Vincent Hall Male    DOB: 12/24/1965   49 y.o.   MRN: [id number] Visit Date: 10/28/2014  Today's Provider: [name], MD   Chief Complaint  Patient presents with  . Anxiety   Subjective:    Anxiety The problem has been gradually worsening. Symptoms include confusion, depressed mood, dizziness, dry mouth, excessive worry, hyperventilation, insomnia, irritability, malaise, muscle tension, nausea, nervous/anxious behavior, palpitations, panic, restlessness, shortness of breath and suicidal ideas. Patient reports no chest pain, decreased concentration or feeling of choking. Symptoms occur constantly (sometimes occurs up to 3 times daily). The severity of symptoms is severe. The quality of sleep is poor.   Treatments tried: Xanax and  Ambien. The treatment provided mild (Xanax has helped with sleeping) relief. Compliance with prior treatments has been good.   States he was in [place] a few weeks ago and had severe panic attack. Had to go to ER. Has been very anxious and irritable lately.Has been very fatigued during the day, and is not sleeping well at night. Sometimes takes a xanax when he can't sleep.  He had been on sertraline for depression, but stopped a month or so ago since depression was better.     Allergies  Allergen Reactions  . Singulair [Montelukast Sodium] Hives   Previous Medications   ALBUTEROL (PROVENTIL HFA;VENTOLIN HFA) 108 (90 BASE) MCG/ACT INHALER    Inhale 2 puffs into the lungs every 6 (six) hours as needed for wheezing or shortness of breath.   ALPRAZOLAM (XANAX) 0.5 MG TABLET    Take 0.5 mg by mouth as needed for anxiety.   ASPIRIN 81 MG TABLET    Take 81 mg by mouth daily.   AZELASTINE (OPTIVAR) 0.05 % OPHTHALMIC SOLUTION    Apply to eye.   CRESTOR 20 MG TABLET    Take 1 tablet by mouth daily.   DEXILANT 60 MG CAPSULE    Take 1 capsule (60 mg total) by mouth daily.   LISINOPRIL (PRINIVIL,ZESTRIL) 2.5 MG TABLET    Take 2.5 mg by mouth daily.    METOPROLOL SUCCINATE (TOPROL-XL) 25 MG 24 HR TABLET    Take 25 mg by mouth daily.   NITROGLYCERIN (NITROSTAT) 0.4 MG SL TABLET    Place under the tongue.   OXYCODONE-ACETAMINOPHEN (ROXICET) 5-325 MG PER TABLET    1/2 - 1 tablet twice daily as needed   PROBIOTIC PRODUCT (SOLUBLE FIBER/PROBIOTICS PO)    Take by mouth once.   RANITIDINE (ZANTAC) 150 MG CAPSULE    Take 150 mg by mouth as needed for heartburn.   SERTRALINE (ZOLOFT) 50 MG TABLET    Take by mouth.   TIZANIDINE (ZANAFLEX) 4 MG TABLET    Take by mouth.   ZOLPIDEM (AMBIEN) 5 MG TABLET    Take by mouth.    Review of Systems  Constitutional: Positive for irritability.  Respiratory: Positive for shortness of breath.   Cardiovascular: Positive for palpitations. Negative for chest pain.  Gastrointestinal: Positive for nausea.  Neurological: Positive for dizziness.  Psychiatric/Behavioral: Positive for suicidal ideas and confusion. Negative for decreased concentration. The patient is nervous/anxious and has insomnia.     History  Substance Use Topics  . Smoking status: Former Smoker -- 0.25 packs/day for 30 years    Types: Cigarettes    Quit date: 08/08/2013  . Smokeless tobacco: Not on file  . Alcohol Use: No   Objective:   BP 104/64 mmHg  Pulse 60  Temp(Src) 98.3 F (36.8 C) (  Oral)  Resp 16  Wt 197 lb (89.359 kg)  SpO2 98%  Physical Exam   General Appearance:    Alert, cooperative, no distress  Eyes:    PERRL, conjunctiva/corneas clear, EOM's intact       Lungs:     Clear to auscultation bilaterally, respirations unlabored  Heart:    Regular rate and rhythm  Neurologic:   Awake, alert, oriented x 3. No apparent focal neurological           defect.           Assessment & Plan:      1. Panic attacks Has been much worse since stopping sertraline a few months since depression was better. Counseled patient that this is an effective antianxiety medications that should control panic attacks if at higher doses.  Restart sertraline and titrate as below. May take alprazolam prn while titration sertraline.  - sertraline (ZOLOFT) 50 MG tablet; 1/2 tablet a day for 6 days, then 1  tablet a day for 6 days, then 1 1/2 tablet daily for 6 days, then 2 daily.  Dispense: 30 tablet; Refill: 1 - ALPRAZolam (XANAX) 0.5 MG tablet; Take 1 tablet (0.5 mg total) by mouth 2 (two) times daily as needed for anxiety.  Dispense: 60 tablet; Refill: 1  2. Insomnia Has stopped [place] which he did not find to be effective. OK to take an alprazolam hs.  - sertraline (ZOLOFT) 50 MG tablet; 1/2 tablet a day for 6 days, then 1  tablet a day for 6 days, then 1 1/2 tablet daily for 6 days, then 2 daily.  Dispense: 30 tablet; Refill: 1 - ALPRAZolam (XANAX) 0.5 MG tablet; Take 1 tablet (0.5 mg total) by mouth 2 (two) times daily as needed for anxiety.  Dispense: 60 tablet; Refill: 1  3. Allergies He states allergies have been much worse and not controlled with OTC medications. He was previously seen at [facility] and requests referral for re-evaluation.   Return in about 4 weeks (around 11/25/2014).       [name], MD  [facility]

## 2014-11-18 ENCOUNTER — Other Ambulatory Visit: Payer: Self-pay | Admitting: Family Medicine

## 2014-11-18 ENCOUNTER — Ambulatory Visit (INDEPENDENT_AMBULATORY_CARE_PROVIDER_SITE_OTHER): Payer: Commercial Managed Care - HMO | Admitting: Family Medicine

## 2014-11-18 ENCOUNTER — Encounter: Payer: Self-pay | Admitting: Family Medicine

## 2014-11-18 ENCOUNTER — Ambulatory Visit: Payer: Commercial Managed Care - HMO | Admitting: Family Medicine

## 2014-11-18 VITALS — BP 110/60 | HR 59 | Temp 98.4°F | Resp 16 | Wt 198.0 lb

## 2014-11-18 DIAGNOSIS — F410 Panic disorder [episodic paroxysmal anxiety] without agoraphobia: Secondary | ICD-10-CM

## 2014-11-18 DIAGNOSIS — F419 Anxiety disorder, unspecified: Secondary | ICD-10-CM | POA: Diagnosis not present

## 2014-11-18 DIAGNOSIS — G4700 Insomnia, unspecified: Secondary | ICD-10-CM

## 2014-11-18 MED ORDER — SERTRALINE HCL 50 MG PO TABS: ORAL_TABLET | ORAL | Status: DC

## 2014-11-18 NOTE — Progress Notes (Signed)
Patient: Vincent Hall Male    DOB: 1965/10/07   49 y.o.   MRN: [id number] Visit Date: 11/18/2014  Today's Provider: [name], MD   Chief Complaint  Patient presents with  . Panic Attack    follow up  . Insomnia    follow up   Subjective:    HPI  Panic attack Follow up: Patient was last seen 3 weeks ago. Management at that visit includes restarting Sertraline and advising patient to titrate up. Patient was also advised that he could take Alprazolam as needed while titrating Sertraline. Today patient comes in reporting that the Panic attacks have improved. Patient is currently taking Sertraline 50mg  1 tablet daily. Patient states he has not had any episodes of  panic attacks since the last visit. Patient reports good compliance with treatment, good tolerance and good symptom control.    Insomnia Follow up: Last office visit was 3 weeks ago. During that visit patient was advised that he could take Alprazolam at night to help with sleeping. Patient reports good compliance with treatment, good tolerance and good symptom control. Patient states he has been taking 1 pill of Alprazolam 30 minutes before bedtime. Patient states his sleep has improved and he has been able to sleep 7-8 hours per night.     Allergies  Allergen Reactions  . Singulair [Montelukast Sodium] Hives   Previous Medications   ALBUTEROL (PROVENTIL HFA;VENTOLIN HFA) 108 (90 BASE) MCG/ACT INHALER    Inhale 2 puffs into the lungs every 6 (six) hours as needed for wheezing or shortness of breath.   ALPRAZOLAM (XANAX) 0.5 MG TABLET    Take 1 tablet (0.5 mg total) by mouth 2 (two) times daily as needed for anxiety.   ASPIRIN 81 MG TABLET    Take 81 mg by mouth daily.   AZELASTINE (OPTIVAR) 0.05 % OPHTHALMIC SOLUTION    Apply to eye.   CRESTOR 20 MG TABLET    Take 1 tablet by mouth daily.   DEXILANT 60 MG CAPSULE    Take 1 capsule (60 mg total) by mouth daily.   METOPROLOL SUCCINATE (TOPROL-XL) 25 MG 24 HR  TABLET    Take 25 mg by mouth daily.   NITROGLYCERIN (NITROSTAT) 0.4 MG SL TABLET    Place under the tongue.   OXYCODONE-ACETAMINOPHEN (ROXICET) 5-325 MG PER TABLET    1/2 - 1 tablet twice daily as needed   PROBIOTIC PRODUCT (SOLUBLE FIBER/PROBIOTICS PO)    Take by mouth once.   RANITIDINE (ZANTAC) 150 MG CAPSULE    Take 150 mg by mouth as needed for heartburn.   SERTRALINE (ZOLOFT) 50 MG TABLET    1/2 tablet a day for 6 days, then 1  tablet a day for 6 days, then 1 1/2 tablet daily for 6 days, then 2 daily.   TIZANIDINE (ZANAFLEX) 4 MG TABLET    Take by mouth.    Review of Systems  Constitutional: Negative for fever, chills and appetite change.  Respiratory: Negative for chest tightness, shortness of breath and wheezing.   Cardiovascular: Negative for chest pain and palpitations.  Gastrointestinal: Negative for nausea, vomiting and abdominal pain.    Social History  Substance Use Topics  . Smoking status: Former Smoker -- 0.25 packs/day for 30 years    Types: Cigarettes    Quit date: 08/08/2013  . Smokeless tobacco: Not on file  . Alcohol Use: No   Objective:   BP 110/60 mmHg  Pulse 59  Temp(Src) 98.4  F (36.9 C) (Oral)  Resp 16  Wt 198 lb (89.812 kg)  SpO2 96%  Physical Exam  General appearance: alert, well developed, well nourished, cooperative and in no distress Head: Normocephalic, without obvious abnormality, atraumatic Lungs: Respirations even and unlabored Extremities: No gross deformities Skin: Skin color, texture, turgor normal. No rashes seen  Psych: Appropriate mood and affect. Neurologic: Mental status: Alert, oriented to person, place, and time, thought content appropriate.     Assessment & Plan:     1. Anxiety Much better since starting sertraline. Will continue 50mg  for now and is to call if it seems to lose effectiveness.   2. Insomnia Much better, currently taking 0.5mg  alprazolam and advised he should try taking 1/2 tablet or none at all at times,  since his anxiety is much improved.   3. Panic attacks Resolved since starting sertraline. Call if anymore panic attacks. May take alprazolam prn if he does have one.   Return in about 4 months (around 03/20/2015).      [name], MD  [facility]

## 2014-11-19 ENCOUNTER — Other Ambulatory Visit: Payer: Self-pay | Admitting: Family Medicine

## 2014-11-19 DIAGNOSIS — G4700 Insomnia, unspecified: Secondary | ICD-10-CM

## 2014-11-19 DIAGNOSIS — F410 Panic disorder [episodic paroxysmal anxiety] without agoraphobia: Secondary | ICD-10-CM

## 2014-11-19 MED ORDER — SERTRALINE HCL 50 MG PO TABS: ORAL_TABLET | ORAL | Status: DC

## 2014-12-23 ENCOUNTER — Other Ambulatory Visit: Payer: Self-pay | Admitting: Family Medicine

## 2014-12-23 MED ORDER — METOPROLOL SUCCINATE ER 25 MG PO TB24: 12.5000 mg | ORAL_TABLET | Freq: Every day | ORAL | Status: DC

## 2014-12-23 NOTE — Telephone Encounter (Signed)
Pt contacted office for refill request on the following medications:  metoprolol succinate (TOPROL-XL) 25 MG 24 hr tablet.  [name]  [phone]

## 2014-12-23 NOTE — Telephone Encounter (Signed)
Refill request for Metoprolol 25 mg 1/2 tab qd Last filled by MD on- 10/18/2013 #30 x0 refills Last Appt: 11/18/2014 Next Appt: 03/16/2015 Please advise refill?

## 2015-01-24 ENCOUNTER — Other Ambulatory Visit: Payer: Self-pay | Admitting: Family Medicine

## 2015-01-26 ENCOUNTER — Other Ambulatory Visit: Payer: Self-pay | Admitting: Family Medicine

## 2015-01-27 NOTE — Telephone Encounter (Signed)
Rx called in to pharmacy. 

## 2015-01-27 NOTE — Telephone Encounter (Signed)
Please call in alprazolam.  

## 2015-02-22 ENCOUNTER — Ambulatory Visit
Admission: RE | Admit: 2015-02-22 | Discharge: 2015-02-22 | Disposition: A | Discharge status: Home / Self Care | Payer: Commercial Managed Care - HMO | Source: Ambulatory Visit | Attending: Family Medicine | Admitting: Family Medicine

## 2015-02-22 ENCOUNTER — Encounter: Payer: Self-pay | Admitting: Family Medicine

## 2015-02-22 ENCOUNTER — Ambulatory Visit (INDEPENDENT_AMBULATORY_CARE_PROVIDER_SITE_OTHER): Payer: Commercial Managed Care - HMO | Admitting: Family Medicine

## 2015-02-22 VITALS — BP 110/64 | HR 56 | Temp 98.0°F | Resp 16 | Wt 203.0 lb

## 2015-02-22 DIAGNOSIS — M5136 Other intervertebral disc degeneration, lumbar region: Secondary | ICD-10-CM | POA: Diagnosis not present

## 2015-02-22 DIAGNOSIS — M545 Low back pain, unspecified: Secondary | ICD-10-CM

## 2015-02-22 DIAGNOSIS — R072 Precordial pain: Secondary | ICD-10-CM | POA: Diagnosis not present

## 2015-02-22 MED ORDER — OXYCODONE-ACETAMINOPHEN 5-325 MG PO TABS: ORAL_TABLET | ORAL | Status: DC
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
## 2015-02-23 ENCOUNTER — Ambulatory Visit: Payer: Commercial Managed Care - HMO | Admitting: Family Medicine

## 2015-02-25 ENCOUNTER — Other Ambulatory Visit: Payer: Self-pay | Admitting: Family Medicine

## 2015-02-26 NOTE — Telephone Encounter (Signed)
Please call in alprazolam.  

## 2015-02-26 NOTE — Telephone Encounter (Signed)
Done. Prescription called into pharmacy.  

## 2015-03-01 ENCOUNTER — Telehealth: Payer: Self-pay | Admitting: *Deleted

## 2015-03-01 MED ORDER — NAPROXEN 500 MG PO TABS: 500.0000 mg | ORAL_TABLET | Freq: Two times a day (BID) | ORAL | Status: DC

## 2015-03-01 NOTE — Telephone Encounter (Signed)
-----   Message from Birdie Sons, MD sent at 02/23/2015  4:04 PM EST ----- Chest Xray is normal. Xray of spine shows moderate amount or arthritis. Recommend naprosyn 500mg  one tablet twice a day for 14 days, #38, rf x 0.

## 2015-03-01 NOTE — Telephone Encounter (Signed)
Patient notified of results. Patient expressed understanding. Rx sent to pharmacy.  

## 2015-03-16 ENCOUNTER — Ambulatory Visit: Payer: Commercial Managed Care - HMO | Admitting: Family Medicine

## 2015-03-28 ENCOUNTER — Other Ambulatory Visit: Payer: Self-pay | Admitting: Gastroenterology

## 2015-03-29 ENCOUNTER — Other Ambulatory Visit: Payer: Self-pay | Admitting: Family Medicine

## 2015-03-29 DIAGNOSIS — M545 Low back pain, unspecified: Secondary | ICD-10-CM

## 2015-03-29 MED ORDER — OXYCODONE-ACETAMINOPHEN 5-325 MG PO TABS: ORAL_TABLET | ORAL | Status: DC

## 2015-03-29 NOTE — Telephone Encounter (Signed)
Pt contacted office for refill request on the following medications:  oxyCODONE-acetaminophen (ROXICET) 5-325 MG tablet.  [id number]  Pt is leaving to go out of town tomorrow and will need to pick this up today/MJ

## 2015-05-26 ENCOUNTER — Other Ambulatory Visit: Payer: Self-pay | Admitting: Family Medicine

## 2015-05-26 DIAGNOSIS — M545 Low back pain, unspecified: Secondary | ICD-10-CM

## 2015-05-26 DIAGNOSIS — F410 Panic disorder [episodic paroxysmal anxiety] without agoraphobia: Secondary | ICD-10-CM

## 2015-05-26 DIAGNOSIS — G4700 Insomnia, unspecified: Secondary | ICD-10-CM

## 2015-05-26 MED ORDER — SERTRALINE HCL 50 MG PO TABS: ORAL_TABLET | ORAL | Status: DC

## 2015-05-26 MED ORDER — OXYCODONE-ACETAMINOPHEN 5-325 MG PO TABS: ORAL_TABLET | ORAL | Status: DC

## 2015-05-26 NOTE — Telephone Encounter (Signed)
Pt contacted office for refill request on the following medications: 1. oxyCODONE-acetaminophen (ROXICET) 5-325 MG tablet  2. sertraline (ZOLOFT) 50 MG tablet  Pt stated he can pick both RXs up since the pain medication has to be printed anyway. Pt also wanted to let Dr. Caryn Section know that he has an appt with the new Cardiologist 06/03/15. Thanks TNP

## 2015-07-02 ENCOUNTER — Ambulatory Visit (INDEPENDENT_AMBULATORY_CARE_PROVIDER_SITE_OTHER): Payer: Commercial Managed Care - HMO | Admitting: Family Medicine

## 2015-07-02 ENCOUNTER — Encounter: Payer: Self-pay | Admitting: Family Medicine

## 2015-07-02 VITALS — BP 104/62 | HR 72 | Temp 97.6°F | Resp 16 | Wt 188.0 lb

## 2015-07-02 DIAGNOSIS — R208 Other disturbances of skin sensation: Secondary | ICD-10-CM

## 2015-07-02 DIAGNOSIS — R200 Anesthesia of skin: Secondary | ICD-10-CM

## 2015-07-02 DIAGNOSIS — E782 Mixed hyperlipidemia: Secondary | ICD-10-CM

## 2015-07-02 DIAGNOSIS — R079 Chest pain, unspecified: Secondary | ICD-10-CM

## 2015-07-02 DIAGNOSIS — M545 Low back pain, unspecified: Secondary | ICD-10-CM

## 2015-07-02 DIAGNOSIS — R0789 Other chest pain: Secondary | ICD-10-CM

## 2015-07-02 MED ORDER — OXYCODONE-ACETAMINOPHEN 5-325 MG PO TABS: ORAL_TABLET | ORAL | Status: DC

## 2015-07-02 MED ORDER — CRESTOR 20 MG PO TABS: 20.0000 mg | ORAL_TABLET | Freq: Every day | ORAL | Status: DC

## 2015-07-02 MED ORDER — ROSUVASTATIN CALCIUM 20 MG PO TABS: 20.0000 mg | ORAL_TABLET | Freq: Every day | ORAL | Status: DC

## 2015-07-02 NOTE — Patient Instructions (Signed)
Go to the [facility] on [address] for [employer] 
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
## 2015-07-05 ENCOUNTER — Encounter: Payer: Self-pay | Admitting: Family Medicine

## 2015-07-05 ENCOUNTER — Ambulatory Visit
Admission: RE | Admit: 2015-07-05 | Discharge: 2015-07-05 | Disposition: A | Discharge status: Home / Self Care | Payer: Commercial Managed Care - HMO | Source: Ambulatory Visit | Attending: Family Medicine | Admitting: Family Medicine

## 2015-07-05 ENCOUNTER — Ambulatory Visit (INDEPENDENT_AMBULATORY_CARE_PROVIDER_SITE_OTHER): Payer: Commercial Managed Care - HMO | Admitting: Family Medicine

## 2015-07-05 VITALS — BP 110/88 | HR 62 | Temp 97.7°F | Resp 14 | Wt 187.0 lb

## 2015-07-05 DIAGNOSIS — R739 Hyperglycemia, unspecified: Secondary | ICD-10-CM

## 2015-07-05 DIAGNOSIS — R3589 Other polyuria: Secondary | ICD-10-CM

## 2015-07-05 DIAGNOSIS — E119 Type 2 diabetes mellitus without complications: Secondary | ICD-10-CM | POA: Diagnosis not present

## 2015-07-05 DIAGNOSIS — R079 Chest pain, unspecified: Secondary | ICD-10-CM | POA: Diagnosis not present

## 2015-07-05 DIAGNOSIS — R0789 Other chest pain: Secondary | ICD-10-CM

## 2015-07-05 DIAGNOSIS — R358 Other polyuria: Secondary | ICD-10-CM | POA: Diagnosis not present

## 2015-07-05 DIAGNOSIS — Z951 Presence of aortocoronary bypass graft: Secondary | ICD-10-CM | POA: Insufficient documentation

## 2015-07-05 DIAGNOSIS — R7309 Other abnormal glucose: Secondary | ICD-10-CM

## 2015-07-05 DIAGNOSIS — R631 Polydipsia: Secondary | ICD-10-CM

## 2015-07-05 LAB — POCT UA - MICROALBUMIN: Microalbumin Ur, POC: NEGATIVE mg/L

## 2015-07-05 LAB — GLUCOSE, POCT (MANUAL RESULT ENTRY): POC Glucose: 466 mg/dl — AB (ref 70–99)

## 2015-07-05 LAB — POCT GLYCOSYLATED HEMOGLOBIN (HGB A1C): Hemoglobin A1C: >14

## 2015-07-05 MED ORDER — INSULIN GLARGINE 300 UNIT/ML ~~LOC~~ SOPN: 16.0000 [IU] | PEN_INJECTOR | Freq: Every day | SUBCUTANEOUS | Status: DC

## 2015-07-05 NOTE — Patient Instructions (Signed)
Diabetes Mellitus and Food It is important for you to manage your blood sugar (glucose) level. Your blood glucose level can be greatly affected by what you eat. Eating healthier foods in the appropriate amounts throughout the day at about the same time each day will help you control your blood glucose level. It can also help slow or prevent worsening of your diabetes mellitus. Healthy eating may even help you improve the level of your blood pressure and reach or maintain a healthy weight.  General recommendations for healthful eating and cooking habits include:  Eating meals and snacks regularly. Avoid going long periods of time without eating to lose weight.  Eating a diet that consists mainly of plant-based foods, such as fruits, vegetables, nuts, legumes, and whole grains.  Using low-heat cooking methods, such as baking, instead of high-heat cooking methods, such as deep frying. Work with your dietitian to make sure you understand how to use the Nutrition Facts information on food labels. HOW CAN FOOD AFFECT ME? Carbohydrates Carbohydrates affect your blood glucose level more than any other type of food. Your dietitian will help you determine how many carbohydrates to eat at each meal and teach you how to count carbohydrates. Counting carbohydrates is important to keep your blood glucose at a healthy level, especially if you are using insulin or taking certain medicines for diabetes mellitus. Alcohol Alcohol can cause sudden decreases in blood glucose (hypoglycemia), especially if you use insulin or take certain medicines for diabetes mellitus. Hypoglycemia can be a life-threatening condition. Symptoms of hypoglycemia (sleepiness, dizziness, and disorientation) are similar to symptoms of having too much alcohol.  If your health care provider has given you approval to drink alcohol, do so in moderation and use the following guidelines:  Women should not have more than one drink per day, and men  should not have more than two drinks per day. One drink is equal to:  12 oz of beer.  5 oz of wine.  1 oz of hard liquor.  Do not drink on an empty stomach.  Keep yourself hydrated. Have water, diet soda, or unsweetened iced tea.  Regular soda, juice, and other mixers might contain a lot of carbohydrates and should be counted. WHAT FOODS ARE NOT RECOMMENDED? As you make food choices, it is important to remember that all foods are not the same. Some foods have fewer nutrients per serving than other foods, even though they might have the same number of calories or carbohydrates. It is difficult to get your body what it needs when you eat foods with fewer nutrients. Examples of foods that you should avoid that are high in calories and carbohydrates but low in nutrients include:  Trans fats (most processed foods list trans fats on the Nutrition Facts label).  Regular soda.  Juice.  Candy.  Sweets, such as cake, pie, doughnuts, and cookies.  Fried foods. WHAT FOODS CAN I EAT? Eat nutrient-rich foods, which will nourish your body and keep you healthy. The food you should eat also will depend on several factors, including:  The calories you need.  The medicines you take.  Your weight.  Your blood glucose level.  Your blood pressure level.  Your cholesterol level. You should eat a variety of foods, including:  Protein.  Lean cuts of meat.  Proteins low in saturated fats, such as fish, egg whites, and beans. Avoid processed meats.  Fruits and vegetables.  Fruits and vegetables that may help control blood glucose levels, such as apples, mangoes, and   yams.  Dairy products.  Choose fat-free or low-fat dairy products, such as milk, yogurt, and cheese.  Grains, bread, pasta, and rice.  Choose whole grain products, such as multigrain bread, whole oats, and brown rice. These foods may help control blood pressure.  Fats.  Foods containing healthful fats, such as nuts,  avocado, olive oil, canola oil, and fish. DOES EVERYONE WITH DIABETES MELLITUS HAVE THE SAME MEAL PLAN? Because every person with diabetes mellitus is different, there is not one meal plan that works for everyone. It is very important that you meet with a dietitian who will help you create a meal plan that is just right for you.   This information is not intended to replace advice given to you by your health care provider. Make sure you discuss any questions you have with your health care provider.   Document Released: 12/22/2004 Document Revised: 04/17/2014 Document Reviewed: 02/21/2013 Elsevier Interactive Patient Education 2016 Elsevier Inc.  

## 2015-07-05 NOTE — Progress Notes (Signed)
Patient ID: Vincent Hall, Hall   DOB: December 11, 1965, 50 y.o.   MRN: [id number]   Patient: Vincent Hall    DOB: [birth year]   50 y.o.   MRN: [id number] Visit Date: 07/05/2015  Today's Provider: Vernie Murders, PA   Chief Complaint  Patient presents with  . Hyperglycemia   Subjective:    Hyperglycemia This is a new problem. The current episode started yesterday. The problem has been unchanged. Associated symptoms include fatigue. Associated symptoms comments: Polydipsia, polyuria and dizziness. He has tried drinking for the symptoms. The treatment provided no relief.   Patient states she was felling really dizzy last night along with increased thir and urination. Patient checked blood sugar and the glucometer read HI. Patient waited a while and checked again and reading with 396. Patient checked FBS this morning and it was 398. As the morning went on he checked blood sugar again and reading was 425.   Past Medical History  Diagnosis Date  . Coronary artery disease   . History of MI (myocardial infarction)   . Fatty liver   . Asthma    Patient Active Problem List   Diagnosis Date Noted  . Anxiety 10/28/2014  . Arthralgia 10/28/2014  . Chest wall pain 10/28/2014  . Dysphagia 10/28/2014  . Fatigue 10/28/2014  . History of MI (myocardial infarction) 10/28/2014  . History of tobacco use 10/28/2014  . Hyperlipidemia, mixed 10/28/2014  . Insomnia 10/28/2014  . Chronic constipation 09/18/2014  . Esophageal reflux 09/18/2014  . Esophageal spasm 09/18/2014  . Coronary artery disease 07/07/2013  . Allergic rhinitis 06/18/2008   Past Surgical History  Procedure Laterality Date  . Nasal sinus surgery    . Appendectomy    . Cardiac catheterization  06/24/2013  . Coronary angioplasty      stent placement to LAD  . Finger surgery    . Coronary artery bypass graft  07/31/2013    x 3 vessels  . Colonoscopy with propofol N/A 09/23/2014    Procedure: COLONOSCOPY WITH PROPOFOL;   Surgeon: Lucilla Lame, MD;  Location: [facility];  Service: Endoscopy;  Laterality: N/A;  . Polypectomy  09/23/2014    Procedure: POLYPECTOMY;  Surgeon: Lucilla Lame, MD;  Location: [facility];  Service: Endoscopy;;   Family History  Problem Relation Age of Onset  . Hyperlipidemia Mother   . Heart attack Father   . Hyperlipidemia Father   . Diabetes Father   . Heart disease Father    Previous Medications   ALBUTEROL (PROVENTIL HFA;VENTOLIN HFA) 108 (90 BASE) MCG/ACT INHALER    Inhale 2 puffs into the lungs every 6 (six) hours as needed for wheezing or shortness of breath.   ALPRAZOLAM (XANAX) 0.5 MG TABLET    TAKE 1 TABLET BY MOUTH TWICE DAILY AS NEEDED FOR ANXIETY   ASPIRIN 81 MG TABLET    Take 81 mg by mouth daily.   AZELASTINE (OPTIVAR) 0.05 % OPHTHALMIC SOLUTION    Apply to eye.   DEXILANT 60 MG CAPSULE    TAKE ONE CAPSULE BY MOUTH EVERY DAY   NITROGLYCERIN (NITROSTAT) 0.4 MG SL TABLET    Place under the tongue.   OXYCODONE-ACETAMINOPHEN (ROXICET) 5-325 MG TABLET    1/2 - 1 tablet twice daily as needed   PROBIOTIC PRODUCT (SOLUBLE FIBER/PROBIOTICS PO)    Take by mouth once.   RANITIDINE (ZANTAC) 150 MG CAPSULE    Take 150 mg by mouth as needed for heartburn.   ROSUVASTATIN (CRESTOR) 20 MG TABLET  Take 1 tablet (20 mg total) by mouth daily.   SERTRALINE (ZOLOFT) 50 MG TABLET    1 tablet daily   Allergies  Allergen Reactions  . Singulair [Montelukast Sodium] Hives    Review of Systems  Constitutional: Positive for fatigue.  HENT: Negative.   Eyes: Negative.   Respiratory: Negative.   Cardiovascular: Negative.   Gastrointestinal: Negative.   Endocrine: Positive for polydipsia and polyuria.  Musculoskeletal: Negative.   Skin: Negative.   Allergic/Immunologic: Negative.   Neurological: Positive for dizziness.  Hematological: Negative.   Psychiatric/Behavioral: Negative.     Social History  Substance Use Topics  . Smoking status: Former Smoker -- 0.25  packs/day for 30 years    Types: Cigarettes    Quit date: 08/08/2013  . Smokeless tobacco: Not on file  . Alcohol Use: No   Objective:   BP 110/88 mmHg  Pulse 62  Temp(Src) 97.7 F (36.5 C) (Oral)  Resp 14  Wt 187 lb (84.823 kg)  Physical Exam  Constitutional: He is oriented to person, place, and time. He appears well-developed and well-nourished.  HENT:  Head: Normocephalic.  Right Ear: External ear normal.  Left Ear: External ear normal.  Nose: Nose normal.  Mouth/Throat: Oropharynx is clear and moist.  Eyes: Conjunctivae and EOM are normal.  Neck: Normal range of motion. Neck supple.  Cardiovascular: Normal rate, regular rhythm and normal heart sounds.   Pulmonary/Chest: Breath sounds normal.  Abdominal: Soft.  Musculoskeletal: Normal range of motion.  Neurological: He is alert and oriented to person, place, and time.  Normal sensation in feet to test with nylon string.  Skin: No rash noted.  Psychiatric: His behavior is normal. Thought content normal. His mood appears anxious.      Assessment & Plan:     1. Polydipsia Onset over the past several days with blurring of vision and fatigue. Diagnosed with new onset diabetes. Family history positive for father having Type 2 DM and treated with insulin. - POCT HgB A1C - POCT Glucose (CBG) - POCT UA - Microalbumin  2. Polyuria 4+ sugar on dipstick. No ketones. Microalbumin negative. - POCT HgB A1C - POCT Glucose (CBG) - POCT UA - Microalbumin  3. Elevated blood sugar Very high at home the past couple days. Was 425 at home today. Hgb A1C >14%. Will get routine labs and start insulin. Normal sensation in feet to test with nylon string.  - POCT HgB A1C - POCT Glucose (CBG) - POCT UA - Microalbumin  4. New onset type 2 diabetes mellitus ([place]) Will start Toujeo 16 units daily and schedule referral for diabetic education with diet planning at [place]. Check FBS qd and if any symptoms of hypoglycemia. Recheck in  3 days. - CBC with Differential/Platelet - Comprehensive metabolic panel - Lipid panel - Ambulatory referral to diabetic education

## 2015-07-06 ENCOUNTER — Telehealth: Payer: Self-pay

## 2015-07-06 LAB — COMPREHENSIVE METABOLIC PANEL
ALT: 32 IU/L (ref 0–44)
AST: 20 IU/L (ref 0–40)
Albumin/Globulin Ratio: 2 (ref 1.2–2.2)
Albumin: 4.7 g/dL (ref 3.5–5.5)
Alkaline Phosphatase: 107 IU/L (ref 39–117)
BUN/Creatinine Ratio: 10 (ref 9–20)
BUN: 10 mg/dL (ref 6–24)
Bilirubin Total: 0.5 mg/dL (ref 0.0–1.2)
CO2: 24 mmol/L (ref 18–29)
Calcium: 10 mg/dL (ref 8.7–10.2)
Chloride: 89 mmol/L — ABNORMAL LOW (ref 96–106)
Creatinine, Ser: 0.97 mg/dL (ref 0.76–1.27)
GFR calc Af Amer: 104 mL/min/{1.73_m2} (ref >59)
GFR calc non Af Amer: 90 mL/min/{1.73_m2} (ref >59)
Globulin, Total: 2.3 g/dL (ref 1.5–4.5)
Glucose: 412 mg/dL — ABNORMAL HIGH (ref 65–99)
Potassium: 4.4 mmol/L (ref 3.5–5.2)
Sodium: 132 mmol/L — ABNORMAL LOW (ref 134–144)
Total Protein: 7 g/dL (ref 6.0–8.5)

## 2015-07-06 LAB — CBC WITH DIFFERENTIAL/PLATELET
Basophils Absolute: 0 10*3/uL (ref 0.0–0.2)
Basos: 0 %
EOS (ABSOLUTE): 0.2 10*3/uL (ref 0.0–0.4)
Eos: 3 %
Hematocrit: 46.1 % (ref 37.5–51.0)
Hemoglobin: 15.7 g/dL (ref 12.6–17.7)
Immature Grans (Abs): 0 10*3/uL (ref 0.0–0.1)
Immature Granulocytes: 0 %
Lymphocytes Absolute: 3.4 10*3/uL — ABNORMAL HIGH (ref 0.7–3.1)
Lymphs: 40 %
MCH: 28.4 pg (ref 26.6–33.0)
MCHC: 34.1 g/dL (ref 31.5–35.7)
MCV: 83 fL (ref 79–97)
Monocytes Absolute: 0.6 10*3/uL (ref 0.1–0.9)
Monocytes: 7 %
Neutrophils Absolute: 4.3 10*3/uL (ref 1.4–7.0)
Neutrophils: 50 %
Platelets: 222 10*3/uL (ref 150–379)
RBC: 5.53 x10E6/uL (ref 4.14–5.80)
RDW: 12.6 % (ref 12.3–15.4)
WBC: 8.6 10*3/uL (ref 3.4–10.8)

## 2015-07-06 LAB — LIPID PANEL
Chol/HDL Ratio: 3.7 ratio units (ref 0.0–5.0)
Cholesterol, Total: 187 mg/dL (ref 100–199)
HDL: 50 mg/dL (ref >39)
LDL Calculated: 90 mg/dL (ref 0–99)
Triglycerides: 233 mg/dL — ABNORMAL HIGH (ref 0–149)
VLDL Cholesterol Cal: 47 mg/dL — ABNORMAL HIGH (ref 5–40)

## 2015-07-06 NOTE — Telephone Encounter (Signed)
Informed pt as below. Emily Drozdowski, CMA  

## 2015-07-06 NOTE — Telephone Encounter (Signed)
-----   Message from Birdie Sons, MD sent at 07/06/2015  8:09 AM EDT ----- Odette Horns of sternum shows surgical changes, but is otherwise normal. Is likely bruised and may take several months to heal.

## 2015-07-08 ENCOUNTER — Ambulatory Visit (INDEPENDENT_AMBULATORY_CARE_PROVIDER_SITE_OTHER): Payer: Commercial Managed Care - HMO | Admitting: Family Medicine

## 2015-07-08 ENCOUNTER — Encounter: Payer: Self-pay | Admitting: Family Medicine

## 2015-07-08 VITALS — BP 118/76 | HR 68 | Temp 97.7°F | Resp 16 | Wt 187.0 lb

## 2015-07-08 DIAGNOSIS — F419 Anxiety disorder, unspecified: Secondary | ICD-10-CM

## 2015-07-08 DIAGNOSIS — J309 Allergic rhinitis, unspecified: Secondary | ICD-10-CM

## 2015-07-08 DIAGNOSIS — I2510 Atherosclerotic heart disease of native coronary artery without angina pectoris: Secondary | ICD-10-CM | POA: Diagnosis not present

## 2015-07-08 DIAGNOSIS — E119 Type 2 diabetes mellitus without complications: Secondary | ICD-10-CM | POA: Diagnosis not present

## 2015-07-08 LAB — GLUCOSE, POCT (MANUAL RESULT ENTRY): POC Glucose: 305 mg/dl — AB (ref 70–99)

## 2015-07-08 MED ORDER — AZELASTINE HCL 0.1 % NA SOLN: 2.0000 | Freq: Two times a day (BID) | NASAL | Status: DC

## 2015-07-08 MED ORDER — METFORMIN HCL ER 500 MG PO TB24: ORAL_TABLET | ORAL | Status: DC

## 2015-07-08 MED ORDER — AZELASTINE HCL 0.05 % OP SOLN: OPHTHALMIC | Status: DC

## 2015-07-08 MED ORDER — ALPRAZOLAM 0.5 MG PO TABS: 0.5000 mg | ORAL_TABLET | Freq: Two times a day (BID) | ORAL | Status: DC | PRN

## 2015-07-08 MED ORDER — GLUCOSE BLOOD VI DISK: 100.0000 | DISK | Freq: Every day | Status: DC

## 2015-07-08 NOTE — Progress Notes (Signed)
Patient: Vincent Hall    DOB: 1965/12/16   50 y.o.   MRN: [id number] Visit Date: 07/08/2015  Today's Provider: [name], MD   Chief Complaint  Patient presents with  . Diabetes   Subjective:    HPI    Diabetes Mellitus Type II, Initial Visit: Patient here for an initial evaluation of Type 2 diabetes mellitus.  Current symptoms/problems include increase appetite, nausea, polydipsia, polyuria, visual disturbances, weight loss and dizziness and have been worsening.   The patient was initially diagnosed with Type 2 diabetes mellitus based on the following criteria:  A1C was >14 at [place] on 07/05/2015. Pt saw Simona Huh and was put on Toujeo 16 units qd, was referred to lifestyle center, and was advised to check FBS QD and FU in 3 days. His sugars have ranged from low 300s to low 400s the last 3 days. He gave himself additional 10 units Toujeo on the 27th, and additional 6 units on the 28th and 29th. He remains very fatigued, thirsty, and urinating frequently.   CMP Latest Ref Rng 07/05/2015 07/02/2014 02/06/2014  Glucose 65 - 99 mg/dL 412(H) - -  BUN 6 - 24 mg/dL 10 12 -  Creatinine 0.76 - 1.27 mg/dL 0.97 0.9 -  Sodium 134 - 144 mmol/L 132(L) 141 -  Potassium 3.5 - 5.2 mmol/L 4.4 4.2 -  Chloride 96 - 106 mmol/L 89(L) - -  CO2 18 - 29 mmol/L 24 - -  Calcium 8.7 - 10.2 mg/dL 10.0 - -  Total Protein 6.0 - 8.5 g/dL 7.0 - 7.2  Total Bilirubin 0.0 - 1.2 mg/dL 0.5 - 0.4  Alkaline Phos 39 - 117 IU/L 107 - 68  AST 0 - 40 IU/L 20 16 19   ALT 0 - 44 IU/L 32 27 42    He also states his allergies have been terrible. He tried Flonase which caused nosebleed, and OTC antihistamines and Singulair which didn't work very well. He does use Optivar for eyes which he does well with.  He also request refills for alprazolam.    Allergies  Allergen Reactions  . Singulair [Montelukast Sodium] Hives   Previous Medications   ALBUTEROL (PROVENTIL HFA;VENTOLIN HFA) 108 (90 BASE) MCG/ACT  INHALER    Inhale 2 puffs into the lungs every 6 (six) hours as needed for wheezing or shortness of breath.   ALPRAZOLAM (XANAX) 0.5 MG TABLET    TAKE 1 TABLET BY MOUTH TWICE DAILY AS NEEDED FOR ANXIETY   ASPIRIN 81 MG TABLET    Take 81 mg by mouth daily.   AZELASTINE (OPTIVAR) 0.05 % OPHTHALMIC SOLUTION    Apply to eye.   DEXILANT 60 MG CAPSULE    TAKE ONE CAPSULE BY MOUTH EVERY DAY   INSULIN GLARGINE (TOUJEO SOLOSTAR) 300 UNIT/ML SOPN    Inject 16 Units into the skin daily.   NITROGLYCERIN (NITROSTAT) 0.4 MG SL TABLET    Place under the tongue.   OXYCODONE-ACETAMINOPHEN (ROXICET) 5-325 MG TABLET    1/2 - 1 tablet twice daily as needed   PROBIOTIC PRODUCT (SOLUBLE FIBER/PROBIOTICS PO)    Take by mouth once.   RANITIDINE (ZANTAC) 150 MG CAPSULE    Take 150 mg by mouth as needed for heartburn.   ROSUVASTATIN (CRESTOR) 20 MG TABLET    Take 1 tablet (20 mg total) by mouth daily.   SERTRALINE (ZOLOFT) 50 MG TABLET    1 tablet daily    Review of Systems  Constitutional: Positive for appetite  change and unexpected weight change. Negative for chills, diaphoresis and activity change.  Endocrine: Positive for polydipsia, polyphagia and polyuria.  Neurological: Positive for dizziness.    Social History  Substance Use Topics  . Smoking status: Former Smoker -- 0.25 packs/day for 30 years    Types: Cigarettes    Quit date: 08/08/2013  . Smokeless tobacco: Not on file  . Alcohol Use: No   Objective:   BP 118/76 mmHg  Pulse 68  Temp(Src) 97.7 F (36.5 C) (Oral)  Resp 16  Wt 187 lb (84.823 kg)  Physical Exam   General Appearance:    Alert, cooperative, no distress  Eyes:    PERRL, conjunctiva/corneas clear, EOM's intact       Lungs:     Clear to auscultation bilaterally, respirations unlabored  Heart:    Regular rate and rhythm  Neurologic:   Awake, alert, oriented x 3. No apparent focal neurological           defect.           Assessment & Plan:     1. Type 2 diabetes mellitus  without complication, unspecified long term insulin use status (HCC) Continue 16 units Tujeo for the time being. Add metformin and titrate up to 1000 per day. Follow up in 3 weeks. Call if any hypoglycemia or if sugars stay above 400.  - POCT glucose (manual entry) - metFORMIN (GLUCOPHAGE XR) 500 MG 24 hr tablet; One tablet with supper every evening for 7 days, then increase 2 tablets every evening  Dispense: 60 tablet; Refill: 1 - Glucose Blood (BAYER BREEZE 2 TEST) DISK; 100 each by In Vitro route at bedtime.  Dispense: 100 each; Refill: 5  2. Allergic rhinitis, unspecified allergic rhinitis type Add Astelin nasal spray since Flonase caused nosebleeds.  - azelastine (OPTIVAR) 0.05 % ophthalmic solution; drop affect eye twice a day  Dispense: 6 mL; Refill: 3 - azelastine (ASTELIN) 0.1 % nasal spray; Place 2 sprays into both nostrils 2 (two) times daily.  Dispense: 30 mL; Refill: 6  3. Anxiety Refill same dose alprazolam. - ALPRAZolam (XANAX) 0.5 MG tablet; Take 1 tablet (0.5 mg total) by mouth 2 (two) times daily as needed. for anxiety  Dispense: 60 tablet; Refill: 3  4. Coronary artery disease involving native coronary artery of native heart without angina pectoris Asymptomatic. Compliant with medication.  Continue aggressive risk factor modification.         [name], MD  [facility]

## 2015-07-13 ENCOUNTER — Other Ambulatory Visit: Payer: Self-pay | Admitting: Family Medicine

## 2015-07-13 MED ORDER — ONETOUCH ULTRA SYSTEM W/DEVICE KIT: 1.0000 | PACK | Freq: Once | Status: DC

## 2015-07-13 MED ORDER — GLUCOSE BLOOD VI STRP: ORAL_STRIP | Status: DC

## 2015-07-14 ENCOUNTER — Telehealth: Payer: Self-pay | Admitting: Family Medicine

## 2015-07-14 NOTE — Telephone Encounter (Signed)
Pharmacy needs rx for One Touch Meter, strips and lancets.    The "breeze" is not covered by his insurance.

## 2015-07-19 ENCOUNTER — Ambulatory Visit: Payer: Commercial Managed Care - HMO | Admitting: Dietician

## 2015-07-29 ENCOUNTER — Ambulatory Visit (INDEPENDENT_AMBULATORY_CARE_PROVIDER_SITE_OTHER): Payer: Commercial Managed Care - HMO | Admitting: Family Medicine

## 2015-07-29 ENCOUNTER — Encounter: Payer: Self-pay | Admitting: Family Medicine

## 2015-07-29 VITALS — BP 122/78 | HR 68 | Temp 98.0°F | Resp 16 | Wt 189.0 lb

## 2015-07-29 DIAGNOSIS — E119 Type 2 diabetes mellitus without complications: Secondary | ICD-10-CM | POA: Diagnosis not present

## 2015-07-29 MED ORDER — METFORMIN HCL ER 500 MG PO TB24: 1000.0000 mg | ORAL_TABLET | Freq: Two times a day (BID) | ORAL | Status: DC

## 2015-07-29 NOTE — Progress Notes (Signed)
Patient ID: Vincent Hall, male   DOB: [birth year], 50 y.o.   MRN: [id number]       Patient: Vincent Hall Male    DOB: 1965-05-16   50 y.o.   MRN: [id number] Visit Date: 07/29/2015  Today's Provider: [name], MD   Chief Complaint  Patient presents with  . Diabetes   Subjective:    HPI  Diabetes Mellitus Type II, Follow-up:   Lab Results  Component Value Date   HGBA1C >14.0 07/05/2015    Last seen for diabetes 3 weeks ago.  Management since then includes added metformin and tirate up to 100 mg daily. He reports good compliance with treatment. He is not having side effects.  Current symptoms include none and have been unchanged. Home blood sugar records: Usually 160-180. But today it was 126   Episodes of hypoglycemia? no   Current Insulin Regimen: 16 units Tujeo daily Most Recent Eye Exam: 2 years ago Weight trend: stable Current exercise: none  Pertinent Labs:    Component Value Date/Time   CHOL 187 07/05/2015 1150   CHOL 178 07/02/2014   TRIG 233* 07/05/2015 1150   HDL 50 07/05/2015 1150   HDL 45 07/02/2014   LDLCALC 90 07/05/2015 1150   LDLCALC 84 07/02/2014   CREATININE 0.97 07/05/2015 1150   CREATININE 0.9 07/02/2014    Wt Readings from Last 3 Encounters:  07/29/15 189 lb (85.73 kg)  07/08/15 187 lb (84.823 kg)  07/05/15 187 lb (84.823 kg)    ------------------------------------------------------------------------  Pt reports that he is feeling better since his sugar has come down. He had a sinus cold last week but says he is also feeling better from that. He would like more samples of Tujeo as he will be out of it and needles.     Allergies  Allergen Reactions  . Singulair [Montelukast Sodium] Hives   Previous Medications   ALBUTEROL (PROVENTIL HFA;VENTOLIN HFA) 108 (90 BASE) MCG/ACT INHALER    Inhale 2 puffs into the lungs every 6 (six) hours as needed for wheezing or shortness of breath.   ALPRAZOLAM (XANAX) 0.5 MG TABLET    Take  1 tablet (0.5 mg total) by mouth 2 (two) times daily as needed. for anxiety   ASPIRIN 81 MG TABLET    Take 81 mg by mouth daily.   AZELASTINE (ASTELIN) 0.1 % NASAL SPRAY    Place 2 sprays into both nostrils 2 (two) times daily.   AZELASTINE (OPTIVAR) 0.05 % OPHTHALMIC SOLUTION    drop affect eye twice a day   BLOOD GLUCOSE MONITORING SUPPL (ONE TOUCH ULTRA SYSTEM KIT) W/DEVICE KIT    1 kit by Does not apply route once.   DEXILANT 60 MG CAPSULE    TAKE ONE CAPSULE BY MOUTH EVERY DAY   GLUCOSE BLOOD (ONE TOUCH ULTRA TEST) TEST STRIP    Check blood sugar 3 times daily E11.65 (uncontrolled insulin dependent diabetes)   INSULIN GLARGINE (TOUJEO SOLOSTAR) 300 UNIT/ML SOPN    Inject 16 Units into the skin daily.   METFORMIN (GLUCOPHAGE XR) 500 MG 24 HR TABLET    One tablet with supper every evening for 7 days, then increase 2 tablets every evening   NITROGLYCERIN (NITROSTAT) 0.4 MG SL TABLET    Place under the tongue.   OXYCODONE-ACETAMINOPHEN (ROXICET) 5-325 MG TABLET    1/2 - 1 tablet twice daily as needed   PROBIOTIC PRODUCT (SOLUBLE FIBER/PROBIOTICS PO)    Take by mouth once.   RANITIDINE (ZANTAC) 150 MG CAPSULE  Take 150 mg by mouth as needed for heartburn.   ROSUVASTATIN (CRESTOR) 20 MG TABLET    Take 1 tablet (20 mg total) by mouth daily.   SERTRALINE (ZOLOFT) 50 MG TABLET    1 tablet daily    Review of Systems  Constitutional: Positive for chills.  HENT: Positive for congestion, rhinorrhea, sinus pressure and sneezing.   Eyes: Positive for itching.  Respiratory: Negative.   Cardiovascular: Negative.   Endocrine: Negative.   Genitourinary: Negative.   Musculoskeletal: Negative.   Skin: Negative.   Allergic/Immunologic: Negative.   Neurological: Negative.   Hematological: Negative.   Psychiatric/Behavioral: Negative.     Social History  Substance Use Topics  . Smoking status: Former Smoker -- 0.25 packs/day for 30 years    Types: Cigarettes    Quit date: 08/08/2013  . Smokeless  tobacco: Not on file  . Alcohol Use: No   Objective:   BP 122/78 mmHg  Pulse 68  Temp(Src) 98 F (36.7 C) (Oral)  Resp 16  Wt 189 lb (85.73 kg)  SpO2 98%  Physical Exam  General appearance: alert, well developed, well nourished, cooperative and in no distress Head: Normocephalic, without obvious abnormality, atraumatic Lungs: Respirations even and unlabored Extremities: No gross deformities Skin: Skin color, texture, turgor normal. No rashes seen  Psych: Appropriate mood and affect. Neurologic: Mental status: Alert, oriented to person, place, and time, thought content appropriate.     Assessment & Plan:     1. Type 2 diabetes mellitus without complication, unspecified long term insulin use status (HCC) Feeling much better. Will increase metformin and wean Tujeo if fastings get under 120.  - metFORMIN (GLUCOPHAGE XR) 500 MG 24 hr tablet; Take 2 tablets (1,000 mg total) by mouth 2 (two) times daily before a meal.  Dispense: 120 tablet; Refill: 1  Patient Instructions   Increase metformin to 2 tablets TWICE daily   If your fasting sugar is less than 120, reduce dose of Tujeo by 2 units from the day before.    Take 10 units of Novolog insulin ONLY if your blood sugar is >400   Follow up 1 month       [name], MD  [facility]

## 2015-07-29 NOTE — Patient Instructions (Signed)
   Increase metformin to 2 tablets TWICE daily   If your fasting sugar is less than 120, reduce dose of Tujeo by 2 units from the day before.    Take 10 units of Novolog insulin ONLY if your blood sugar is >400

## 2015-08-23 ENCOUNTER — Other Ambulatory Visit: Payer: Self-pay | Admitting: Family Medicine

## 2015-08-23 ENCOUNTER — Telehealth: Payer: Self-pay | Admitting: *Deleted

## 2015-08-23 DIAGNOSIS — M545 Low back pain, unspecified: Secondary | ICD-10-CM

## 2015-08-23 MED ORDER — OXYCODONE-ACETAMINOPHEN 5-325 MG PO TABS: ORAL_TABLET | ORAL | Status: DC

## 2015-08-23 NOTE — Telephone Encounter (Signed)
Pt needs refill on his oxyCODONE-acetaminophen (ROXICET) 5-325 MG tablet Taking 07/02/15 -- Birdie Sons, MD 1/2 - 1 tablet twice daily as needed  He also asked if he could get a sample of the Insulin Glargine (TOUJEO SOLOSTAR) 300 UNIT/ML SOPN   His call back [phone]  Thanks Con Memos

## 2015-08-23 NOTE — Telephone Encounter (Signed)
He also asked if he could get a sample of the Insulin Glargine (TOUJEO SOLOSTAR) 300 UNIT/ML SOPN   His call back [phone]

## 2015-08-30 ENCOUNTER — Encounter: Payer: Self-pay | Admitting: Family Medicine

## 2015-08-30 ENCOUNTER — Ambulatory Visit (INDEPENDENT_AMBULATORY_CARE_PROVIDER_SITE_OTHER): Payer: Commercial Managed Care - HMO | Admitting: Family Medicine

## 2015-08-30 VITALS — BP 118/64 | HR 65 | Temp 98.3°F | Resp 16 | Ht 68.0 in | Wt 196.0 lb

## 2015-08-30 DIAGNOSIS — G4700 Insomnia, unspecified: Secondary | ICD-10-CM | POA: Diagnosis not present

## 2015-08-30 DIAGNOSIS — M549 Dorsalgia, unspecified: Secondary | ICD-10-CM

## 2015-08-30 DIAGNOSIS — I2510 Atherosclerotic heart disease of native coronary artery without angina pectoris: Secondary | ICD-10-CM

## 2015-08-30 DIAGNOSIS — E119 Type 2 diabetes mellitus without complications: Secondary | ICD-10-CM | POA: Diagnosis not present

## 2015-08-30 DIAGNOSIS — F410 Panic disorder [episodic paroxysmal anxiety] without agoraphobia: Secondary | ICD-10-CM | POA: Diagnosis not present

## 2015-08-30 DIAGNOSIS — F419 Anxiety disorder, unspecified: Secondary | ICD-10-CM | POA: Diagnosis not present

## 2015-08-30 DIAGNOSIS — M546 Pain in thoracic spine: Secondary | ICD-10-CM | POA: Diagnosis not present

## 2015-08-30 LAB — POCT GLYCOSYLATED HEMOGLOBIN (HGB A1C)
Est. average glucose Bld gHb Est-mCnc: 263
Hemoglobin A1C: 10.8

## 2015-08-30 MED ORDER — OXYCODONE-ACETAMINOPHEN 5-325 MG PO TABS: ORAL_TABLET | ORAL | Status: DC

## 2015-08-30 MED ORDER — SERTRALINE HCL 50 MG PO TABS: 75.0000 mg | ORAL_TABLET | Freq: Every day | ORAL | Status: DC

## 2015-08-30 NOTE — Progress Notes (Signed)
Patient: Vincent Hall Male    DOB: January 08, 1966   50 y.o.   MRN: [id number] Visit Date: 08/30/2015  Today's Provider: [name], MD   Chief Complaint  Patient presents with  . Follow-up  . Diabetes   Subjective:    HPI   Diabetes Mellitus Type II, Follow-up:   Lab Results  Component Value Date   HGBA1C >14.0 07/05/2015   Last seen for diabetes 1 months ago. Management since then includes; Increase metformin to 2 tablets TWICE daily. If fasting sugar is less than 120, reduce dose of Tujeo by 2 units from the day before. Take 10 units of Novolog insulin ONLY if your blood sugar is >400 He reports good compliance with treatment. He is not having side effects. none Current symptoms include none and have been unchanged. Home blood sugar records: fasting range: 115-120  Int the mid 100s at other times of the day.   Episodes of hypoglycemia? no   Current Insulin Regimen: Toujeo Most Recent Eye Exam: due-2 years ago Weight trend: stable Prior visit with dietician: no Current diet: well balanced Current exercise: walking  ----------------------------------------------------------------------  Having pain in his left upper back over shoulder blace and shoulder every day. Is persistent. Taking oxycodone twice a day every day.    Allergies  Allergen Reactions  . Singulair [Montelukast Sodium] Hives   Previous Medications   ALBUTEROL (PROVENTIL HFA;VENTOLIN HFA) 108 (90 BASE) MCG/ACT INHALER    Inhale 2 puffs into the lungs every 6 (six) hours as needed for wheezing or shortness of breath.   ALPRAZOLAM (XANAX) 0.5 MG TABLET    Take 1 tablet (0.5 mg total) by mouth 2 (two) times daily as needed. for anxiety   ASPIRIN 81 MG TABLET    Take 81 mg by mouth daily.   AZELASTINE (ASTELIN) 0.1 % NASAL SPRAY    Place 2 sprays into both nostrils 2 (two) times daily.   AZELASTINE (OPTIVAR) 0.05 % OPHTHALMIC SOLUTION    drop affect eye twice a day   BLOOD GLUCOSE MONITORING  SUPPL (ONE TOUCH ULTRA SYSTEM KIT) W/DEVICE KIT    1 kit by Does not apply route once.   DEXILANT 60 MG CAPSULE    TAKE ONE CAPSULE BY MOUTH EVERY DAY   GLUCOSE BLOOD (ONE TOUCH ULTRA TEST) TEST STRIP    Check blood sugar 3 times daily E11.65 (uncontrolled insulin dependent diabetes)   INSULIN GLARGINE (TOUJEO SOLOSTAR) 300 UNIT/ML SOPN    Inject 16 Units into the skin daily.   METFORMIN (GLUCOPHAGE XR) 500 MG 24 HR TABLET    Take 2 tablets (1,000 mg total) by mouth 2 (two) times daily before a meal.   NITROGLYCERIN (NITROSTAT) 0.4 MG SL TABLET    Place under the tongue.   OXYCODONE-ACETAMINOPHEN (ROXICET) 5-325 MG TABLET    1/2 - 1 tablet twice daily as needed   PROBIOTIC PRODUCT (SOLUBLE FIBER/PROBIOTICS PO)    Take by mouth once.   RANITIDINE (ZANTAC) 150 MG CAPSULE    Take 150 mg by mouth as needed for heartburn.   ROSUVASTATIN (CRESTOR) 20 MG TABLET    Take 1 tablet (20 mg total) by mouth daily.   SERTRALINE (ZOLOFT) 50 MG TABLET    1 tablet daily    Review of Systems  Constitutional: Negative for fever, chills and appetite change.  Respiratory: Negative for chest tightness, shortness of breath and wheezing.   Cardiovascular: Negative for chest pain and palpitations.  Gastrointestinal: Negative for nausea,  vomiting and abdominal pain.    Social History  Substance Use Topics  . Smoking status: Former Smoker -- 0.25 packs/day for 30 years    Types: Cigarettes    Quit date: 08/08/2013  . Smokeless tobacco: Not on file  . Alcohol Use: No   Objective:   BP 118/64 mmHg  Pulse 65  Temp(Src) 98.3 F (36.8 C) (Oral)  Resp 16  Ht _0  (1.727 m)  Wt 196 lb (88.905 kg)  BMI 29.81 kg/m2  SpO2 98%  Physical Exam   General Appearance:    Alert, cooperative, no distress  Eyes:    PERRL, conjunctiva/corneas clear, EOM's intact       Lungs:     Clear to auscultation bilaterally, respirations unlabored  Heart:    Regular rate and rhythm  Neurologic:   Awake, alert, oriented x 3. No  apparent focal neurological           defect.       Results for orders placed or performed in visit on 08/30/15  POCT glycosylated hemoglobin (Hb A1C)  Result Value Ref Range   Hemoglobin A1C 10.8    Est. average glucose Bld gHb Est-mCnc 263        Assessment & Plan:     1. Type 2 diabetes mellitus without complication, unspecified long term insulin use status (HCC) Improving. Continue current medications and diet.  - POCT glycosylated hemoglobin (Hb A1C)  2. Anxiety Doing better since increasing sertralinet to 1 1/2 tablets daily.  - sertraline (ZOLOFT) 50 MG tablet; Take 1.5 tablets (75 mg total) by mouth daily.  Dispense: 45 tablet; Refill: 5  3. Coronary artery disease involving native coronary artery of native heart without angina pectoris Asymptomatic. Compliant with medication.  Continue aggressive risk factor modification.    4. Upper back pain on left side Requiring 2 oxycodone every day to continue normal daily activities. He would like to see if something else can be done so he does not become dependent on pain pills. Will refer orthopedics for additional evaluation  - AMB referral to orthopedics - oxyCODONE-acetaminophen (ROXICET) 5-325 MG tablet; One tablet one to two times a day as needed  Dispense: 60 tablet; Refill: 0  5. Panic attacks Controlled.  - sertraline (ZOLOFT) 50 MG tablet; Take 1.5 tablets (75 mg total) by mouth daily.  Dispense: 45 tablet; Refill: 5  Return in about 3 months (around 11/30/2015).       [name], MD  [facility]

## 2015-09-08 ENCOUNTER — Other Ambulatory Visit: Payer: Self-pay | Admitting: Orthopedic Surgery

## 2015-09-08 DIAGNOSIS — M546 Pain in thoracic spine: Secondary | ICD-10-CM

## 2015-09-14 ENCOUNTER — Telehealth: Payer: Self-pay | Admitting: Family Medicine

## 2015-09-14 NOTE — Telephone Encounter (Signed)
Please review. Thanks!  

## 2015-09-14 NOTE — Telephone Encounter (Signed)
Vincent Hall from [facility] sent message that pt was a no show for appointment

## 2015-09-14 NOTE — Telephone Encounter (Signed)
Advise patient to reschedule appointment with [facility] for diabetes education for diet, exercise and sugar control.

## 2015-09-15 ENCOUNTER — Ambulatory Visit: Payer: 59

## 2015-09-22 ENCOUNTER — Ambulatory Visit: Payer: 59 | Attending: Orthopedic Surgery

## 2015-10-21 ENCOUNTER — Other Ambulatory Visit: Payer: Self-pay | Admitting: Family Medicine

## 2015-10-21 DIAGNOSIS — M549 Dorsalgia, unspecified: Secondary | ICD-10-CM

## 2015-10-21 NOTE — Telephone Encounter (Signed)
Pt contacted office for refill request on the following medications: 1. oxyCODONE-acetaminophen (ROXICET) 5-325 MG tablet  2. zolpidem (AMBIEN) 5 MG tablet to be sent to [facility]. Pt stated that Dr. Caryn Section wrote the Ambien awhile back and he takes as needed.  Oxycodone: Last written: 08/30/15  Last OV: 08/30/15 Please advise. Thanks TNP

## 2015-10-22 MED ORDER — ZOLPIDEM TARTRATE 5 MG PO TABS: 5.0000 mg | ORAL_TABLET | Freq: Every evening | ORAL | Status: DC | PRN

## 2015-10-22 MED ORDER — OXYCODONE-ACETAMINOPHEN 5-325 MG PO TABS: ORAL_TABLET | ORAL | Status: DC

## 2015-10-22 NOTE — Telephone Encounter (Signed)
Rx called in.   Thanks,   -[name]  

## 2015-10-22 NOTE — Telephone Encounter (Signed)
Patient is also requesting refill on Zolpidem Tartrate (Ambien) 5mg . This medication is not listed in his medication list.

## 2015-10-22 NOTE — Telephone Encounter (Signed)
Please call in zolpidem  

## 2015-10-26 ENCOUNTER — Other Ambulatory Visit: Payer: Self-pay | Admitting: Family Medicine

## 2015-10-26 DIAGNOSIS — M549 Dorsalgia, unspecified: Secondary | ICD-10-CM

## 2015-10-26 MED ORDER — OXYCODONE-ACETAMINOPHEN 5-325 MG PO TABS: ORAL_TABLET | ORAL | Status: DC

## 2015-10-26 NOTE — Telephone Encounter (Signed)
Pt is out of oxyCODONE-acetaminophen (ROXICET) 5-325 MG tablet and need refill.  I see where it was processed however do not see the RX up front. .  Please call when pt can pick up RX.

## 2015-11-16 ENCOUNTER — Other Ambulatory Visit: Payer: Self-pay | Admitting: Family Medicine

## 2015-11-16 DIAGNOSIS — E119 Type 2 diabetes mellitus without complications: Secondary | ICD-10-CM

## 2015-11-16 DIAGNOSIS — M549 Dorsalgia, unspecified: Secondary | ICD-10-CM

## 2015-11-16 MED ORDER — OXYCODONE-ACETAMINOPHEN 5-325 MG PO TABS: ORAL_TABLET | ORAL | 0 refills | Status: DC

## 2015-11-16 NOTE — Telephone Encounter (Signed)
Pt contacted office for refill request on the following medications: oxyCODONE-acetaminophen (ROXICET) 5-325 MG tablet  Pt stated he would be out of the medication in about 4 days. Last written: 10/26/15 Last OV: 08/30/15 Please advise. Thanks TNP

## 2015-11-30 ENCOUNTER — Ambulatory Visit: Payer: Commercial Managed Care - HMO | Admitting: Family Medicine

## 2016-01-06 ENCOUNTER — Other Ambulatory Visit: Payer: Self-pay | Admitting: Family Medicine

## 2016-01-06 DIAGNOSIS — M549 Dorsalgia, unspecified: Secondary | ICD-10-CM

## 2016-01-06 NOTE — Telephone Encounter (Signed)
Pt needs refill on his oxycodone.  Please call when ready to pick up  [phone]  Thanks, teri

## 2016-01-08 MED ORDER — OXYCODONE-ACETAMINOPHEN 5-325 MG PO TABS: ORAL_TABLET | ORAL | 0 refills | Status: DC

## 2016-01-27 ENCOUNTER — Other Ambulatory Visit: Payer: Self-pay | Admitting: Family Medicine

## 2016-01-27 DIAGNOSIS — F419 Anxiety disorder, unspecified: Secondary | ICD-10-CM

## 2016-01-27 NOTE — Telephone Encounter (Signed)
Please call in alprazolam.  

## 2016-01-27 NOTE — Telephone Encounter (Signed)
Rx called in to pharmacy. 

## 2016-02-15 ENCOUNTER — Other Ambulatory Visit: Payer: Self-pay | Admitting: Family Medicine

## 2016-02-15 NOTE — Telephone Encounter (Signed)
Refill request. KW 

## 2016-02-15 NOTE — Telephone Encounter (Signed)
Patient is overdue for office visit to follow up meds and diabetes. Needs to schedule before refill can be approved.

## 2016-02-15 NOTE — Telephone Encounter (Signed)
Pt contacted office for refill request on the following medications: ° °oxyCODONE-acetaminophen (ROXICET) 5-325 MG tablet ° °CB#[phone]/MW °

## 2016-02-15 NOTE — Telephone Encounter (Signed)
Patient has been advised. KW 

## 2016-02-16 ENCOUNTER — Ambulatory Visit (INDEPENDENT_AMBULATORY_CARE_PROVIDER_SITE_OTHER): Payer: 59 | Admitting: Family Medicine

## 2016-02-16 ENCOUNTER — Encounter: Payer: Self-pay | Admitting: Family Medicine

## 2016-02-16 VITALS — BP 126/80 | HR 65 | Temp 98.0°F | Resp 16 | Wt 205.0 lb

## 2016-02-16 DIAGNOSIS — F410 Panic disorder [episodic paroxysmal anxiety] without agoraphobia: Secondary | ICD-10-CM

## 2016-02-16 DIAGNOSIS — Z23 Encounter for immunization: Secondary | ICD-10-CM | POA: Diagnosis not present

## 2016-02-16 DIAGNOSIS — I2510 Atherosclerotic heart disease of native coronary artery without angina pectoris: Secondary | ICD-10-CM

## 2016-02-16 DIAGNOSIS — F32A Depression, unspecified: Secondary | ICD-10-CM

## 2016-02-16 DIAGNOSIS — E1165 Type 2 diabetes mellitus with hyperglycemia: Secondary | ICD-10-CM

## 2016-02-16 DIAGNOSIS — F329 Major depressive disorder, single episode, unspecified: Secondary | ICD-10-CM | POA: Diagnosis not present

## 2016-02-16 DIAGNOSIS — M549 Dorsalgia, unspecified: Secondary | ICD-10-CM

## 2016-02-16 DIAGNOSIS — E119 Type 2 diabetes mellitus without complications: Secondary | ICD-10-CM | POA: Diagnosis not present

## 2016-02-16 DIAGNOSIS — F419 Anxiety disorder, unspecified: Secondary | ICD-10-CM

## 2016-02-16 DIAGNOSIS — IMO0001 Reserved for inherently not codable concepts without codable children: Secondary | ICD-10-CM

## 2016-02-16 LAB — POCT GLYCOSYLATED HEMOGLOBIN (HGB A1C)
Est. average glucose Bld gHb Est-mCnc: 220
Hemoglobin A1C: 9.3

## 2016-02-16 MED ORDER — METFORMIN HCL ER 500 MG PO TB24: 500.0000 mg | ORAL_TABLET | Freq: Every day | ORAL | 1 refills | Status: DC

## 2016-02-16 MED ORDER — DAPAGLIFLOZIN PROPANEDIOL 5 MG PO TABS: 5.0000 mg | ORAL_TABLET | Freq: Every day | ORAL | 0 refills | Status: DC

## 2016-02-16 MED ORDER — INSULIN GLARGINE 300 UNIT/ML ~~LOC~~ SOPN: 16.0000 [IU] | PEN_INJECTOR | Freq: Every day | SUBCUTANEOUS | 0 refills | Status: DC

## 2016-02-16 MED ORDER — SERTRALINE HCL 100 MG PO TABS: 100.0000 mg | ORAL_TABLET | Freq: Every day | ORAL | 5 refills | Status: DC

## 2016-02-16 MED ORDER — OXYCODONE-ACETAMINOPHEN 5-325 MG PO TABS: 1.0000 | ORAL_TABLET | Freq: Three times a day (TID) | ORAL | 0 refills | Status: DC | PRN

## 2016-02-16 NOTE — Patient Instructions (Signed)
Dapagliflozin tablets What is this medicine? DAPAGLIFLOZIN (DAP a gli FLOE zin) helps to treat type 2 diabetes. It helps to control blood sugar. Treatment is combined with diet and exercise. This medicine may be used for other purposes; ask your health care provider or pharmacist if you have questions. What should I tell my health care provider before I take this medicine? They need to know if you have any of these conditions: -bladder cancer -dehydration -diabetic ketoacidosis -diet low in salt -eating less due to illness, surgery, dieting, or any other reason -having surgery -high cholesterol -history of pancreatitis or pancreas problems -history of yeast infection of the penis or vagina -if you often drink alcohol -infections in the bladder, kidneys, or urinary tract -kidney disease -low blood pressure -on hemodialysis -problems urinating -type 1 diabetes -uncircumcised male -an unusual or allergic reaction to dapagliflozin, other medicines, foods, dyes, or preservatives -pregnant or trying to get pregnant -breast-feeding How should I use this medicine? Take this medicine by mouth with a glass of water. Follow the directions on the prescription label. You can take it with or without food. If it upsets your stomach, take it with food. Take this medicine in the morning. Take your dose at the same time each day. Do not take more often than directed. Do not stop taking except on your doctor's advice. A special MedGuide will be given to you by the pharmacist with each prescription and refill. Be sure to read this information carefully each time. Talk to your pediatrician regarding the use of this medicine in children. Special care may be needed. Overdosage: If you think you have taken too much of this medicine contact a poison control center or emergency room at once. NOTE: This medicine is only for you. Do not share this medicine with others. What if I miss a dose? If you miss a dose,  take it as soon as you can. If it is almost time for your next dose, take only that dose. Do not take double or extra doses. What may interact with this medicine? Do not take this medicine with any of the following medications: -gatifloxacinThis medicine may also interact with the following medications: -alcohol -certain medicines for blood pressure, heart disease -diuretics -insulin -nateglinide -pioglitazone -quinolone antibiotics like ciprofloxacin, levofloxacin, ofloxacin -repaglinide -some herbal dietary supplements -steroid medicines like prednisone or cortisone -sulfonylureas like glimepiride, glipizide, glyburide -thyroid medicine This list may not describe all possible interactions. Give your health care provider a list of all the medicines, herbs, non-prescription drugs, or dietary supplements you use. Also tell them if you smoke, drink alcohol, or use illegal drugs. Some items may interact with your medicine. What should I watch for while using this medicine? Visit your doctor or health care professional for regular checks on your progress. This medicine can cause a serious condition in which there is too much acid in the blood. If you develop nausea, vomiting, stomach pain, unusual tiredness, or breathing problems, stop taking this medicine and call your doctor right away. If possible, use a ketone dipstick to check for ketones in your urine. A test called the HbA1C (A1C) will be monitored. This is a simple blood test. It measures your blood sugar control over the last 2 to 3 months. You will receive this test every 3 to 6 months. Learn how to check your blood sugar. Learn the symptoms of low and high blood sugar and how to manage them. Always carry a quick-source of sugar with you in case you   have symptoms of low blood sugar. Examples include hard sugar candy or glucose tablets. Make sure others know that you can choke if you eat or drink when you develop serious symptoms of low  blood sugar, such as seizures or unconsciousness. They must get medical help at once. Tell your doctor or health care professional if you have high blood sugar. You might need to change the dose of your medicine. If you are sick or exercising more than usual, you might need to change the dose of your medicine. Do not skip meals. Ask your doctor or health care professional if you should avoid alcohol. Many nonprescription cough and cold products contain sugar or alcohol. These can affect blood sugar. Wear a medical ID bracelet or chain, and carry a card that describes your disease and details of your medicine and dosage times. What side effects may I notice from receiving this medicine? Side effects that you should report to your doctor or health care professional as soon as possible: -allergic reactions like skin rash, itching or hives, swelling of the face, lips, or tongue -breathing problems -dizziness -feeling faint or lightheaded, falls -muscle weakness -nausea, vomiting, unusual stomach upset or pain -signs and symptoms of low blood sugar such as feeling anxious, confusion, dizziness, increased hunger, unusually weak or tired, sweating, shakiness, cold, irritable, headache, blurred vision, fast heartbeat, loss of consciousness -signs and symptoms of a urinary tract infection, such as fever, chills, a burning feeling when urinating, blood in the urine, back pain -trouble passing urine or change in the amount of urine, including an urgent need to urinate more often, in larger amounts, or at night -penile discharge, itching, or pain in men -unusual tiredness -vaginal discharge, itching, or odor in women Side effects that usually do not require medical attention (Report these to your doctor or health care professional if they continue or are bothersome.): -constipation -mild increase in urination -stuffy or runny nose -sore throat -thirsty This list may not describe all possible side  effects. Call your doctor for medical advice about side effects. You may report side effects to FDA at 1-800-FDA-1088. Where should I keep my medicine? Keep out of the reach of children. Store at room temperature between 15 and 30 degrees C (59 and 86 degrees F). Throw away any unused medicine after the expiration date. NOTE: This sheet is a summary. It may not cover all possible information. If you have questions about this medicine, talk to your doctor, pharmacist, or health care provider.    2016, Elsevier/Gold Standard. (2014-03-17 11:14:42)  

## 2016-02-16 NOTE — Progress Notes (Signed)
Patient: Vincent Hall Male    DOB: 08-07-1965   50 y.o.   MRN: [id number] Visit Date: 02/16/2016  Today's Provider: [name], MD   Chief Complaint  Patient presents with  . Diabetes    follow up  . Panic Attack    follow up  . Anxiety    follow up   Subjective:    HPI  Diabetes Mellitus Type II, Follow-up:   Lab Results  Component Value Date   HGBA1C 10.8 08/30/2015   HGBA1C >14.0 07/05/2015    Last seen for diabetes 6 months ago.  Management since then includes no changes. He reports poor compliance with treatment. Patient has been forgetting to take medications and has been out of Toujeo for 2 months.  He states he often only takes one metformin daily because the second one upsets his stomach.   Home blood sugar records: as high as 400 yesterday after eating  Episodes of hypoglycemia? no   Current Insulin Regimen: none Most Recent Eye Exam: >1 year ago Weight trend: increasing steadily Prior visit with dietician: no Current diet: well balanced Current exercise: walking  Pertinent Labs:    Component Value Date/Time   CHOL 187 07/05/2015 1150   TRIG 233 (H) 07/05/2015 1150   HDL 50 07/05/2015 1150   LDLCALC 90 07/05/2015 1150   CREATININE 0.97 07/05/2015 1150    Wt Readings from Last 3 Encounters:  08/30/15 196 lb (88.9 kg)  07/29/15 189 lb (85.7 kg)  07/08/15 187 lb (84.8 kg)    ------------------------------------------------------------------------ Follow up Panic Attacks: Patient was last seen for this problem 6 months ago and no changes were made. Patient was to continue Zoloft. He is not having panic attacks but having a lot of trouble with his mood as below.    Follow up Anxiety: Patient was last seen for this problem 6 months ago and no changes were made. Patient comes in today reporting good compliance with treatment, good tolerance and poor symptom control. Patient feels that his anxiety has worsened since the last visit. He  states he feels stressed out constantly and often depressed. He has had episodes of road rage that have him concerned. States he has a very short fuse, and cannot sleep well at night. Cannot identify any particular triggers. Admits to feeling desire to hurt others or himself when he gets upset, but not at this time. He would like to see a psychiatrist for further evaluation.  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 He often has to take a third oxycodone during the day due to severe pain.     Allergies  Allergen Reactions  . Singulair [Montelukast Sodium] Hives     Current Outpatient Prescriptions:  .  albuterol (PROVENTIL HFA;VENTOLIN HFA) 108 (90 BASE) MCG/ACT inhaler, Inhale 2 puffs into the lungs every 6 (six) hours as needed for wheezing or shortness of breath., Disp: , Rfl:  .  ALPRAZolam (XANAX) 0.5 MG tablet, TAKE 1 TABLET BY MOUTH TWICE DAILY AS NEEDED FOR ANXIETY, Disp: 60 tablet, Rfl: 5 .  aspirin 81 MG tablet, Take 81 mg by mouth daily., Disp: , Rfl:  .  azelastine (ASTELIN) 0.1 % nasal spray, Place 2 sprays into both nostrils 2 (two) times daily., Disp: 30 mL, Rfl: 6 .  azelastine (OPTIVAR) 0.05 % ophthalmic solution, drop affect eye twice a day, Disp: 6 mL, Rfl: 3 .  Blood Glucose Monitoring Suppl ([place]  KIT) w/Device KIT, 1 kit by Does not apply route once., Disp: 1 each, Rfl: 0 .  DEXILANT 60 MG capsule, TAKE ONE CAPSULE BY MOUTH EVERY DAY, Disp: 30 capsule, Rfl: 11 .  glucose blood (ONE TOUCH ULTRA TEST) test strip, Check blood sugar 3 times daily E11.65 (uncontrolled insulin dependent diabetes), Disp: 100 each, Rfl: 12 .  Insulin Glargine (TOUJEO SOLOSTAR) 300 UNIT/ML SOPN, Inject 16 Units into the skin daily., Disp: 1 pen, Rfl: o .  nitroGLYCERIN (NITROSTAT) 0.4 MG SL tablet, Place under the tongue., Disp: , Rfl:  .  oxyCODONE-acetaminophen (ROXICET) 5-325 MG tablet, One tablet one to two times a day as needed, Disp: 60 tablet, Rfl:  0 .  Probiotic Product (SOLUBLE FIBER/PROBIOTICS PO), Take by mouth once., Disp: , Rfl:  .  ranitidine (ZANTAC) 150 MG capsule, Take 150 mg by mouth as needed for heartburn., Disp: , Rfl:  .  rosuvastatin (CRESTOR) 20 MG tablet, Take 1 tablet (20 mg total) by mouth daily., Disp: 30 tablet, Rfl: 12 .  sertraline (ZOLOFT) 50 MG tablet, Take 1.5 tablets (75 mg total) by mouth daily., Disp: 45 tablet, Rfl: 5 .  zolpidem (AMBIEN) 5 MG tablet, Take 1 tablet (5 mg total) by mouth at bedtime as needed for sleep., Disp: 20 tablet, Rfl: 1  Review of Systems  Constitutional: Negative for appetite change, chills and fever.  Respiratory: Negative for chest tightness, shortness of breath and wheezing.   Cardiovascular: Negative for chest pain and palpitations.  Gastrointestinal: Negative for abdominal pain, nausea and vomiting.  Psychiatric/Behavioral: Positive for agitation, confusion, decreased concentration and sleep disturbance. Negative for hallucinations and suicidal ideas. The patient is nervous/anxious.     Social History  Substance Use Topics  . Smoking status: Former Smoker    Packs/day: 0.25    Years: 30.00    Types: Cigarettes    Quit date: 08/08/2013  . Smokeless tobacco: Never Used  . Alcohol use No   Objective:   BP 126/80 (BP Location: Left Arm, Patient Position: Sitting, Cuff Size: Large)   Pulse 65   Temp 98 F (36.7 C) (Oral)   Resp 16   Wt 205 lb (93 kg)   SpO2 100% Comment: room air  BMI 31.17 kg/m   Physical Exam  General Appearance:    Alert, cooperative, no distress, obese  Eyes:    PERRL, conjunctiva/corneas clear, EOM's intact       Lungs:     Clear to auscultation bilaterally, respirations unlabored  Heart:    Regular rate and rhythm  Neurologic:   Awake, alert, oriented x 3. No apparent focal neurological           defect.        Results for orders placed or performed in visit on 02/16/16  POCT HgB A1C  Result Value Ref Range   Hemoglobin A1C 9.3    Est.  average glucose Bld gHb Est-mCnc 220        Assessment & Plan:     1. Uncontrolled type 2 diabetes mellitus without complication, without long-term current use of insulin ([place]) Get back on Toujeo and given 2 sample pens a day. He only tolerates one metformin per day. Will add Farxga 12m, given 3 weeks samples. To follow up in 3 weeks to check BP and electrolytes.   - POCT HgB A1C - Insulin Glargine (TOUJEO SOLOSTAR) 300 UNIT/ML SOPN; Inject 16 Units into the skin daily.  Dispense: 2 pen; Refill: 0 - dapagliflozin propanediol (FARXIGA) 5  MG TABS tablet; Take 5 mg by mouth daily.  Dispense: 21 tablet; Refill: 0 - metFORMIN (GLUCOPHAGE-XR) 500 MG 24 hr tablet; Take 1 tablet (500 mg total) by mouth daily with breakfast.  Dispense: 1 tablet; Refill: 1  2. Coronary artery disease involving native coronary artery of native heart without angina pectoris Asymptomatic. Compliant with medication.  Continue aggressive risk factor modification.  He is having significant worsening of pain at CABG site. Counseled on risk of dependence on pain medication and only take third oxycodone when pain is severe or unbearable.   3. Anxiety Worsening with no clear triggers. He has recently gone up to 2 x 63m sertraline a day and will refill with 1021mtablets.  - Ambulatory referral to Psychiatry - sertraline (ZOLOFT) 100 MG tablet; Take 1 tablet (100 mg total) by mouth daily.  Dispense: 30 tablet; Refill: 5  4. Panic attacks Sertraline effective, but still with a lot of stress.  - Ambulatory referral to Psychiatry - sertraline (ZOLOFT) 100 MG tablet; Take 1 tablet (100 mg total) by mouth daily.  Dispense: 30 tablet; Refill: 5  5. Upper back pain on left side  - oxyCODONE-acetaminophen (ROXICET) 5-325 MG tablet; Take 1 tablet by mouth every 8 (eight) hours as needed for severe pain.  Dispense: 90 tablet; Refill: 0  6. Depression, unspecified depression type  - Ambulatory referral to Psychiatry  7. Need for  influenza vaccination  - Flu Vaccine QUAD 36+ mos PF IM (Fluarix & Fluzone Quad PF)  8. Type 2 diabetes mellitus without complication, unspecified long term insulin use status (HCC)  - metFORMIN (GLUCOPHAGE-XR) 500 MG 24 hr tablet; Take 1 tablet (500 mg total) by mouth daily with breakfast.  Dispense: 1 tablet; Refill: 1  Return in about 3 weeks (around 03/08/2016).       DoLelon HuhMD  [facility]

## 2016-03-08 ENCOUNTER — Telehealth: Payer: Self-pay | Admitting: Family Medicine

## 2016-03-08 ENCOUNTER — Ambulatory Visit (INDEPENDENT_AMBULATORY_CARE_PROVIDER_SITE_OTHER): Payer: 59 | Admitting: Family Medicine

## 2016-03-08 ENCOUNTER — Encounter: Payer: Self-pay | Admitting: Family Medicine

## 2016-03-08 VITALS — BP 120/80 | HR 79 | Temp 98.3°F | Resp 16 | Wt 199.0 lb

## 2016-03-08 DIAGNOSIS — J301 Allergic rhinitis due to pollen: Secondary | ICD-10-CM

## 2016-03-08 DIAGNOSIS — IMO0001 Reserved for inherently not codable concepts without codable children: Secondary | ICD-10-CM

## 2016-03-08 DIAGNOSIS — E1165 Type 2 diabetes mellitus with hyperglycemia: Secondary | ICD-10-CM

## 2016-03-08 DIAGNOSIS — L03211 Cellulitis of face: Secondary | ICD-10-CM | POA: Diagnosis not present

## 2016-03-08 MED ORDER — CEPHALEXIN 500 MG PO CAPS: 500.0000 mg | ORAL_CAPSULE | Freq: Four times a day (QID) | ORAL | 0 refills | Status: AC

## 2016-03-08 NOTE — Telephone Encounter (Signed)
Please review., KW 

## 2016-03-08 NOTE — Progress Notes (Signed)
Patient: Vincent Hall Male    DOB: Mar 02, 1966   50 y.o.   MRN: [id number] Visit Date: 03/09/2016  Today's Provider: [name], MD   Chief Complaint  Patient presents with  . Follow-up  . Diabetes  . Anxiety   Subjective:    HPI  Anxiety From 02/16/2016- referred to Psychiatry. He is feeling better, although is still waiting to here from office for appointment.     Diabetes Mellitus Type II, Follow-up:   Lab Results  Component Value Date   HGBA1C 9.3 02/16/2016   HGBA1C 10.8 08/30/2015   HGBA1C >14.0 07/05/2015    Last seen for diabetes 3 weeks ago.  Management since then includes; Get back on Toujeo and given 2 sample pens a day. He only tolerates one metformin per day. Will add Farxga 33m, given 3 weeks samples. To follow up in 3 weeks to check BP and electrolytes.  He reports good compliance with treatment. He is not having side effects. none Current symptoms include none and have been unchanged. Home blood sugar records: fasting range: 211  Episodes of hypoglycemia? no   Current Insulin Regimen: 20 units qd Most Recent Eye Exam: due Weight trend: stable Prior visit with dietician: no Current diet: well balanced Current exercise: walking  Pertinent Labs:    Component Value Date/Time   CHOL 187 07/05/2015 1150   TRIG 233 (H) 07/05/2015 1150   HDL 50 07/05/2015 1150   LDLCALC 90 07/05/2015 1150   CREATININE 0.97 07/05/2015 1150    Wt Readings from Last 3 Encounters:  03/08/16 199 lb (90.3 kg)  02/16/16 205 lb (93 kg)  08/30/15 196 lb (88.9 kg)    ------------------------------------------------------------------    Allergies  Allergen Reactions  . Singulair [Montelukast Sodium] Hives     Current Outpatient Prescriptions:  .  albuterol (PROVENTIL HFA;VENTOLIN HFA) 108 (90 BASE) MCG/ACT inhaler, Inhale 2 puffs into the lungs every 6 (six) hours as needed for wheezing or shortness of breath., Disp: , Rfl:  .  ALPRAZolam (XANAX)  0.5 MG tablet, TAKE 1 TABLET BY MOUTH TWICE DAILY AS NEEDED FOR ANXIETY, Disp: 60 tablet, Rfl: 5 .  aspirin 81 MG tablet, Take 81 mg by mouth daily., Disp: , Rfl:  .  azelastine (ASTELIN) 0.1 % nasal spray, Place 2 sprays into both nostrils 2 (two) times daily., Disp: 30 mL, Rfl: 6 .  azelastine (OPTIVAR) 0.05 % ophthalmic solution, drop affect eye twice a day, Disp: 6 mL, Rfl: 3 .  Blood Glucose Monitoring Suppl (ONE TOUCH ULTRA SYSTEM KIT) w/Device KIT, 1 kit by Does not apply route once., Disp: 1 each, Rfl: 0 .  dapagliflozin propanediol (FARXIGA) 5 MG TABS tablet, Take 5 mg by mouth daily., Disp: 21 tablet, Rfl: 0 .  DEXILANT 60 MG capsule, TAKE ONE CAPSULE BY MOUTH EVERY DAY, Disp: 30 capsule, Rfl: 11 .  glucose blood (ONE TOUCH ULTRA TEST) test strip, Check blood sugar 3 times daily E11.65 (uncontrolled insulin dependent diabetes), Disp: 100 each, Rfl: 12 .  Insulin Glargine (TOUJEO SOLOSTAR) 300 UNIT/ML SOPN, Inject 16 Units into the skin daily., Disp: 1 pen, Rfl: 0 .  metFORMIN (GLUCOPHAGE-XR) 500 MG 24 hr tablet, Take 1 tablet (500 mg total) by mouth daily with breakfast., Disp: 1 tablet, Rfl: 1 .  nitroGLYCERIN (NITROSTAT) 0.4 MG SL tablet, Place under the tongue., Disp: , Rfl:  .  oxyCODONE-acetaminophen (ROXICET) 5-325 MG tablet, Take 1 tablet by mouth every 8 (eight) hours as needed for  severe pain., Disp: 90 tablet, Rfl: 0 .  Probiotic Product (SOLUBLE FIBER/PROBIOTICS PO), Take by mouth once., Disp: , Rfl:  .  ranitidine (ZANTAC) 150 MG capsule, Take 150 mg by mouth as needed for heartburn., Disp: , Rfl:  .  rosuvastatin (CRESTOR) 20 MG tablet, Take 1 tablet (20 mg total) by mouth daily., Disp: 30 tablet, Rfl: 12 .  sertraline (ZOLOFT) 100 MG tablet, Take 1 tablet (100 mg total) by mouth daily., Disp: 30 tablet, Rfl: 5 .  cephALEXin (KEFLEX) 500 MG capsule, Take 1 capsule (500 mg total) by mouth 4 (four) times daily., Disp: 28 capsule, Rfl: 0 .  zolpidem (AMBIEN) 5 MG tablet, Take 1  tablet (5 mg total) by mouth at bedtime as needed for sleep., Disp: 20 tablet, Rfl: 1  Review of Systems  Constitutional: Negative for appetite change, chills and fever.  Respiratory: Negative for chest tightness, shortness of breath and wheezing.   Cardiovascular: Negative for chest pain and palpitations.  Gastrointestinal: Negative for abdominal pain, nausea and vomiting.    Social History  Substance Use Topics  . Smoking status: Former Smoker    Packs/day: 0.25    Years: 30.00    Types: Cigarettes    Quit date: 08/08/2013  . Smokeless tobacco: Never Used  . Alcohol use No   Objective:   BP 120/80 (BP Location: Right Arm, Patient Position: Sitting, Cuff Size: Large)   Pulse 79   Temp 98.3 F (36.8 C) (Oral)   Resp 16   Wt 199 lb (90.3 kg)   SpO2 97%   BMI 30.26 kg/m   Physical Exam   General Appearance:    Alert, cooperative, no distress  Eyes:    PERRL, conjunctiva/corneas clear, EOM's intact       Lungs:     Clear to auscultation bilaterally, respirations unlabored  Heart:    Regular rate and rhythm  Neurologic:   Awake, alert, oriented x 3. No apparent focal neurological           defect.   Skin:   Red tender slightly swollen left mid cheek.        Assessment & Plan:     1. Cellulitis of face  - cephALEXin (KEFLEX) 500 MG capsule; Take 1 capsule (500 mg total) by mouth 4 (four) times daily.  Dispense: 28 capsule; Refill: 0  2. Uncontrolled type 2 diabetes mellitus without complication, without long-term current use of insulin ([place]) Doing well with Jardiance.  - Renal function panel       [name], MD  [facility]

## 2016-03-08 NOTE — Telephone Encounter (Signed)
Pt would like referral to see Dr Donneta Romberg for allergies

## 2016-03-11 LAB — RENAL FUNCTION PANEL
Albumin: 4.5 g/dL (ref 3.5–5.5)
BUN/Creatinine Ratio: 11 (ref 9–20)
BUN: 12 mg/dL (ref 6–24)
CO2: 26 mmol/L (ref 18–29)
Calcium: 9.5 mg/dL (ref 8.7–10.2)
Chloride: 98 mmol/L (ref 96–106)
Creatinine, Ser: 1.07 mg/dL (ref 0.76–1.27)
GFR calc Af Amer: 92 mL/min/{1.73_m2} (ref >59)
GFR calc non Af Amer: 79 mL/min/{1.73_m2} (ref >59)
Glucose: 212 mg/dL — ABNORMAL HIGH (ref 65–99)
Phosphorus: 3 mg/dL (ref 2.5–4.5)
Potassium: 4.7 mmol/L (ref 3.5–5.2)
Sodium: 140 mmol/L (ref 134–144)

## 2016-03-13 ENCOUNTER — Telehealth: Payer: Self-pay | Admitting: *Deleted

## 2016-03-13 MED ORDER — DAPAGLIFLOZIN PRO-METFORMIN ER 10-500 MG PO TB24: 1.0000 | ORAL_TABLET | Freq: Every day | ORAL | 3 refills | Status: DC

## 2016-03-13 NOTE — Telephone Encounter (Signed)
Patient was notified of results. Expressed understanding. rx sent to pharmacy.

## 2016-03-13 NOTE — Telephone Encounter (Signed)
-----   Message from Birdie Sons, MD sent at 03/13/2016  8:17 AM EST ----- Correction. We were going to have him change to XigduoXR 10/500 one tablet daily, #30, rf x 3. This will replace metformin and [place].

## 2016-03-22 ENCOUNTER — Other Ambulatory Visit: Payer: Self-pay | Admitting: Family Medicine

## 2016-03-22 DIAGNOSIS — M549 Dorsalgia, unspecified: Secondary | ICD-10-CM

## 2016-03-22 NOTE — Telephone Encounter (Signed)
Please review. Thanks!  

## 2016-03-22 NOTE — Telephone Encounter (Signed)
Pt contacted office for refill request on the following medications: oxyCODONE-acetaminophen (ROXICET) 5-325 MG tablet Last Rx: 02/16/16 Last OV: 03/08/16 Please advise. Thanks TNP

## 2016-03-23 MED ORDER — OXYCODONE-ACETAMINOPHEN 5-325 MG PO TABS: 1.0000 | ORAL_TABLET | Freq: Three times a day (TID) | ORAL | 0 refills | Status: DC | PRN

## 2016-04-01 ENCOUNTER — Other Ambulatory Visit: Payer: Self-pay | Admitting: Gastroenterology

## 2016-04-04 ENCOUNTER — Other Ambulatory Visit: Payer: Self-pay

## 2016-04-04 MED ORDER — DEXLANSOPRAZOLE 60 MG PO CPDR: 1.0000 | DELAYED_RELEASE_CAPSULE | Freq: Every day | ORAL | 12 refills | Status: DC

## 2016-04-04 NOTE — Telephone Encounter (Signed)
Patient requesting refill on Dexilant-aa

## 2016-04-05 ENCOUNTER — Other Ambulatory Visit: Payer: Self-pay | Admitting: Family Medicine

## 2016-04-05 MED ORDER — CANAGLIFLOZIN-METFORMIN HCL 150-500 MG PO TABS: 1.0000 | ORAL_TABLET | Freq: Two times a day (BID) | ORAL | 3 refills | Status: DC

## 2016-04-05 NOTE — Progress Notes (Signed)
Change xigduo to 3 month trial of invokamet per formulary.

## 2016-05-09 ENCOUNTER — Other Ambulatory Visit: Payer: Self-pay | Admitting: *Deleted

## 2016-05-09 DIAGNOSIS — M549 Dorsalgia, unspecified: Secondary | ICD-10-CM

## 2016-05-09 MED ORDER — OXYCODONE-ACETAMINOPHEN 5-325 MG PO TABS: 1.0000 | ORAL_TABLET | Freq: Three times a day (TID) | ORAL | 0 refills | Status: DC | PRN

## 2016-06-06 LAB — HM DIABETES EYE EXAM

## 2016-06-08 ENCOUNTER — Other Ambulatory Visit: Payer: Self-pay | Admitting: Family Medicine

## 2016-06-08 DIAGNOSIS — F419 Anxiety disorder, unspecified: Secondary | ICD-10-CM

## 2016-06-08 DIAGNOSIS — M549 Dorsalgia, unspecified: Secondary | ICD-10-CM

## 2016-06-08 MED ORDER — ALPRAZOLAM 0.5 MG PO TABS: 0.5000 mg | ORAL_TABLET | Freq: Two times a day (BID) | ORAL | 5 refills | Status: DC | PRN

## 2016-06-08 MED ORDER — OXYCODONE-ACETAMINOPHEN 5-325 MG PO TABS: 1.0000 | ORAL_TABLET | Freq: Three times a day (TID) | ORAL | 0 refills | Status: DC | PRN

## 2016-06-08 NOTE — Telephone Encounter (Signed)
Pt needs refill on his   zolpidem (AMBIEN) 5 MG tablet (Expired)   10/22/15 11/21/15 Birdie Sons, MD    Take 1 tablet (5 mg total) by mouth at       oxyCODONE-acetaminophen (ROXICET) 5-325 MG tablet  Thank sTeri

## 2016-06-08 NOTE — Telephone Encounter (Signed)
OK to call in both. Thanks.

## 2016-06-08 NOTE — Telephone Encounter (Signed)
Please review. Thanks!  

## 2016-06-08 NOTE — Telephone Encounter (Signed)
What would like me to do about the Zolpidem? ED

## 2016-06-08 NOTE — Telephone Encounter (Signed)
Done  ED 

## 2016-06-08 NOTE — Telephone Encounter (Signed)
Please call in alprazolam.  

## 2016-06-13 ENCOUNTER — Encounter: Payer: Self-pay | Admitting: Family Medicine

## 2016-06-13 ENCOUNTER — Ambulatory Visit (INDEPENDENT_AMBULATORY_CARE_PROVIDER_SITE_OTHER): Payer: 59 | Admitting: Family Medicine

## 2016-06-13 ENCOUNTER — Other Ambulatory Visit: Payer: Self-pay | Admitting: Family Medicine

## 2016-06-13 VITALS — BP 122/78 | HR 71 | Temp 98.0°F | Resp 16 | Wt 194.0 lb

## 2016-06-13 DIAGNOSIS — E1165 Type 2 diabetes mellitus with hyperglycemia: Secondary | ICD-10-CM

## 2016-06-13 DIAGNOSIS — E782 Mixed hyperlipidemia: Secondary | ICD-10-CM

## 2016-06-13 DIAGNOSIS — M2550 Pain in unspecified joint: Secondary | ICD-10-CM | POA: Diagnosis not present

## 2016-06-13 DIAGNOSIS — I2510 Atherosclerotic heart disease of native coronary artery without angina pectoris: Secondary | ICD-10-CM | POA: Diagnosis not present

## 2016-06-13 DIAGNOSIS — IMO0001 Reserved for inherently not codable concepts without codable children: Secondary | ICD-10-CM

## 2016-06-13 LAB — POCT GLYCOSYLATED HEMOGLOBIN (HGB A1C)
Est. average glucose Bld gHb Est-mCnc: 263
Hemoglobin A1C: 10.8

## 2016-06-13 MED ORDER — ALBUTEROL SULFATE HFA 108 (90 BASE) MCG/ACT IN AERS: 2.0000 | INHALATION_SPRAY | Freq: Four times a day (QID) | RESPIRATORY_TRACT | 3 refills | Status: DC | PRN

## 2016-06-13 MED ORDER — GLIPIZIDE ER 5 MG PO TB24: 5.0000 mg | ORAL_TABLET | Freq: Every day | ORAL | 3 refills | Status: DC

## 2016-06-13 NOTE — Progress Notes (Signed)
Patient: Vincent Hall Male    DOB: [birth year]   51 y.o.   MRN: [id number] Visit Date: 06/13/2016  Today's Provider: [name], MD   Chief Complaint  Patient presents with  . Diabetes    follow up  . Anxiety    follow up   Subjective:    HPI  Diabetes Mellitus Type II, Follow-up:   Lab Results  Component Value Date   HGBA1C 9.3 02/16/2016   HGBA1C 10.8 08/30/2015   HGBA1C >14.0 07/05/2015    Last seen for diabetes 3 months ago.  Management since then includes starting Xigduoxr 10/510m daily and stopping metformin which was upsetting his stomach. Has also stopped basal insulin.  He reports good compliance with treatment. He is not having side effects.  Current symptoms include visual disturbances and have been stable. Home blood sugar records: fasting range: 150's  Episodes of hypoglycemia? no   Current Insulin Regimen: Tujeou 16 units daily Most Recent Eye Exam: 05/2016 Weight trend: decreasing steadily Prior visit with dietician: no Current diet: well balanced Current exercise: walking  Pertinent Labs:    Component Value Date/Time   CHOL 187 07/05/2015 1150   TRIG 233 (H) 07/05/2015 1150   HDL 50 07/05/2015 1150   LDLCALC 90 07/05/2015 1150   CREATININE 1.07 03/10/2016 1446    Wt Readings from Last 3 Encounters:  03/08/16 199 lb (90.3 kg)  02/16/16 205 lb (93 kg)  08/30/15 196 lb (88.9 kg)    ------------------------------------------------------------------------ Follow up Anxiety:  Patient was last seen for this problem 3 months ago and no changes were made. During that visit Zoloft was refilled and patient was referred to Psychiatry. Patient comes in today reporting good compliance with treatment, good tolerance and fair symptom control. Patient states he has not been seen by the Psychiatrist since the last office visit.   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 No adverse effects from medication.      Allergies  Allergen Reactions  . Singulair [Montelukast Sodium] Hives     Current Outpatient Prescriptions:  .  albuterol (PROVENTIL HFA;VENTOLIN HFA) 108 (90 BASE) MCG/ACT inhaler, Inhale 2 puffs into the lungs every 6 (six) hours as needed for wheezing or shortness of breath., Disp: , Rfl:  .  ALPRAZolam (XANAX) 0.5 MG tablet, Take 1 tablet (0.5 mg total) by mouth 2 (two) times daily as needed. for anxiety, Disp: 60 tablet, Rfl: 5 .  aspirin 81 MG tablet, Take 81 mg by mouth daily., Disp: , Rfl:  .  azelastine (ASTELIN) 0.1 % nasal spray, Place 2 sprays into both nostrils 2 (two) times daily., Disp: 30 mL, Rfl: 6 .  azelastine (OPTIVAR) 0.05 % ophthalmic solution, drop affect eye twice a day, Disp: 6 mL, Rfl: 3 .  Blood Glucose Monitoring Suppl (ONE TOUCH ULTRA SYSTEM KIT) w/Device KIT, 1 kit by Does not apply route once., Disp: 1 each, Rfl: 0 .  Canagliflozin-Metformin HCl 150-500 MG TABS, Take 1 tablet by mouth 2 (two) times daily., Disp: 60 tablet, Rfl: 3 .  dexlansoprazole (DEXILANT) 60 MG capsule, Take 1 capsule (60 mg total) by mouth daily., Disp: 30 capsule, Rfl: 12 .  glucose blood (ONE TOUCH ULTRA TEST) test strip, Check blood sugar 3 times daily E11.65 (uncontrolled insulin dependent diabetes), Disp: 100 each, Rfl: 12 .  nitroGLYCERIN (NITROSTAT) 0.4 MG SL tablet, Place under the tongue., Disp: ,  Rfl:  .  oxyCODONE-acetaminophen (ROXICET) 5-325 MG tablet, Take 1 tablet by mouth every 8 (eight) hours as needed for severe pain., Disp: 90 tablet, Rfl: 0 .  Probiotic Product (SOLUBLE FIBER/PROBIOTICS PO), Take by mouth once., Disp: , Rfl:  .  ranitidine (ZANTAC) 150 MG capsule, Take 150 mg by mouth as needed for heartburn., Disp: , Rfl:  .  rosuvastatin (CRESTOR) 20 MG tablet, Take 1 tablet (20 mg total) by mouth daily., Disp: 30 tablet, Rfl: 12 .  sertraline (ZOLOFT) 100 MG tablet, Take 1 tablet (100 mg total) by mouth  daily., Disp: 30 tablet, Rfl: 5 .  zolpidem (AMBIEN) 5 MG tablet, TAKE 1 TABLET BY MOUTH EVERY NIGHT AT BEDTIME AS NEEDED FOR SLEEP, Disp: 20 tablet, Rfl: 0  Review of Systems  Constitutional: Negative for appetite change, chills and fever.  Eyes: Positive for visual disturbance.  Respiratory: Negative for chest tightness, shortness of breath and wheezing.   Cardiovascular: Negative for chest pain and palpitations.  Gastrointestinal: Negative for abdominal pain, nausea and vomiting.    Social History  Substance Use Topics  . Smoking status: Former Smoker    Packs/day: 0.25    Years: 30.00    Types: Cigarettes    Quit date: 08/08/2013  . Smokeless tobacco: Never Used  . Alcohol use No   Objective:   BP 122/78 (BP Location: Left Arm, Patient Position: Sitting, Cuff Size: Large)   Pulse 71   Temp 98 F (36.7 C) (Oral)   Resp 16   Wt 194 lb (88 kg)   SpO2 97% Comment: room air  BMI 29.50 kg/m     Physical Exam   General Appearance:    Alert, cooperative, no distress  Eyes:    PERRL, conjunctiva/corneas clear, EOM's intact       Lungs:     Clear to auscultation bilaterally, respirations unlabored  Heart:    Regular rate and rhythm  Neurologic:   Awake, alert, oriented x 3. No apparent focal neurological           defect.       Results for orders placed or performed in visit on 06/13/16  POCT HgB A1C  Result Value Ref Range   Hemoglobin A1C 10.8    Est. average glucose Bld gHb Est-mCnc 263        Assessment & Plan:     1. Uncontrolled diabetes mellitus type 2 without complications, unspecified long term insulin use status ([place]) He had trouble with higher doses of metformin. A1c also up due to being out of medication for several days last week. For now will add glipizide and recheck in 3 months.  - POCT HgB A1C - glipiZIDE (GLUCOTROL XL) 5 MG 24 hr tablet; Take 1 tablet (5 mg total) by mouth daily with breakfast.  Dispense: 30 tablet; Refill: 3  2. Coronary artery  disease involving native coronary artery of native heart without angina pectoris Asymptomatic. Compliant with medication.  Continue aggressive risk factor modification.  Continue routine follow up [facility] cardiology.  - Comprehensive metabolic panel  3. Hyperlipidemia, mixed He is tolerating rosuvastatin well with no adverse effects.   - Comprehensive metabolic panel - Lipid panel  4. Arthralgia, unspecified joint Continue prn pain medications.   Return in about 3 months (around 09/13/2016).        [name], MD  [facility]

## 2016-06-14 LAB — LIPID PANEL
Chol/HDL Ratio: 4.2 ratio units (ref 0.0–5.0)
Cholesterol, Total: 202 mg/dL — ABNORMAL HIGH (ref 100–199)
HDL: 48 mg/dL (ref >39)
LDL Calculated: 113 mg/dL — ABNORMAL HIGH (ref 0–99)
Triglycerides: 203 mg/dL — ABNORMAL HIGH (ref 0–149)
VLDL Cholesterol Cal: 41 mg/dL — ABNORMAL HIGH (ref 5–40)

## 2016-06-14 LAB — COMPREHENSIVE METABOLIC PANEL
ALT: 36 IU/L (ref 0–44)
AST: 19 IU/L (ref 0–40)
Albumin/Globulin Ratio: 2 (ref 1.2–2.2)
Albumin: 4.9 g/dL (ref 3.5–5.5)
Alkaline Phosphatase: 61 IU/L (ref 39–117)
BUN/Creatinine Ratio: 11 (ref 9–20)
BUN: 11 mg/dL (ref 6–24)
Bilirubin Total: 0.4 mg/dL (ref 0.0–1.2)
CO2: 22 mmol/L (ref 18–29)
Calcium: 9.8 mg/dL (ref 8.7–10.2)
Chloride: 96 mmol/L (ref 96–106)
Creatinine, Ser: 0.99 mg/dL (ref 0.76–1.27)
GFR calc Af Amer: 101 mL/min/{1.73_m2} (ref >59)
GFR calc non Af Amer: 87 mL/min/{1.73_m2} (ref >59)
Globulin, Total: 2.4 g/dL (ref 1.5–4.5)
Glucose: 215 mg/dL — ABNORMAL HIGH (ref 65–99)
Potassium: 4.1 mmol/L (ref 3.5–5.2)
Sodium: 136 mmol/L (ref 134–144)
Total Protein: 7.3 g/dL (ref 6.0–8.5)

## 2016-07-17 ENCOUNTER — Other Ambulatory Visit: Payer: Self-pay | Admitting: Family Medicine

## 2016-07-17 DIAGNOSIS — M549 Dorsalgia, unspecified: Secondary | ICD-10-CM

## 2016-07-17 NOTE — Telephone Encounter (Signed)
Pt contacted office for refill request on the following medications:  CB#[id number]/MW  oxyCODONE-acetaminophen (ROXICET) 5-325 MG tablet  zolpidem (AMBIEN) 5 MG tablet

## 2016-07-18 MED ORDER — ZOLPIDEM TARTRATE 5 MG PO TABS: 5.0000 mg | ORAL_TABLET | Freq: Every evening | ORAL | 0 refills | Status: DC | PRN

## 2016-07-18 MED ORDER — OXYCODONE-ACETAMINOPHEN 5-325 MG PO TABS: 1.0000 | ORAL_TABLET | Freq: Three times a day (TID) | ORAL | 0 refills | Status: DC | PRN

## 2016-07-18 NOTE — Telephone Encounter (Signed)
Rx called in to pharmacy. 

## 2016-07-18 NOTE — Telephone Encounter (Signed)
Please call in zolpidem  

## 2016-07-21 ENCOUNTER — Other Ambulatory Visit: Payer: Self-pay | Admitting: Family Medicine

## 2016-08-21 ENCOUNTER — Other Ambulatory Visit: Payer: Self-pay | Admitting: Family Medicine

## 2016-08-21 DIAGNOSIS — M549 Dorsalgia, unspecified: Secondary | ICD-10-CM

## 2016-08-21 MED ORDER — OXYCODONE-ACETAMINOPHEN 5-325 MG PO TABS: 1.0000 | ORAL_TABLET | Freq: Three times a day (TID) | ORAL | 0 refills | Status: DC | PRN

## 2016-08-21 NOTE — Telephone Encounter (Signed)
Pt contacted office for refill request on the following medications: ° °oxyCODONE-acetaminophen (ROXICET) 5-325 MG tablet ° °CB#[phone]/MW °

## 2016-09-14 DIAGNOSIS — R21 Rash and other nonspecific skin eruption: Secondary | ICD-10-CM | POA: Diagnosis not present

## 2016-09-14 DIAGNOSIS — J3089 Other allergic rhinitis: Secondary | ICD-10-CM | POA: Diagnosis not present

## 2016-09-14 DIAGNOSIS — J4530 Mild persistent asthma, uncomplicated: Secondary | ICD-10-CM | POA: Diagnosis not present

## 2016-09-14 DIAGNOSIS — H1045 Other chronic allergic conjunctivitis: Secondary | ICD-10-CM | POA: Diagnosis not present

## 2016-09-25 ENCOUNTER — Encounter: Payer: Self-pay | Admitting: Family Medicine

## 2016-09-25 ENCOUNTER — Ambulatory Visit (INDEPENDENT_AMBULATORY_CARE_PROVIDER_SITE_OTHER): Payer: BLUE CROSS/BLUE SHIELD | Admitting: Family Medicine

## 2016-09-25 VITALS — BP 130/80 | HR 72 | Temp 98.2°F | Resp 16 | Ht 68.0 in | Wt 199.0 lb

## 2016-09-25 DIAGNOSIS — I2510 Atherosclerotic heart disease of native coronary artery without angina pectoris: Secondary | ICD-10-CM

## 2016-09-25 DIAGNOSIS — E119 Type 2 diabetes mellitus without complications: Secondary | ICD-10-CM

## 2016-09-25 DIAGNOSIS — R0789 Other chest pain: Secondary | ICD-10-CM | POA: Diagnosis not present

## 2016-09-25 DIAGNOSIS — M549 Dorsalgia, unspecified: Secondary | ICD-10-CM | POA: Diagnosis not present

## 2016-09-25 LAB — POCT GLYCOSYLATED HEMOGLOBIN (HGB A1C)
Est. average glucose Bld gHb Est-mCnc: 177
Hemoglobin A1C: 7.8

## 2016-09-25 MED ORDER — OXYCODONE-ACETAMINOPHEN 5-325 MG PO TABS: 1.0000 | ORAL_TABLET | Freq: Three times a day (TID) | ORAL | 0 refills | Status: DC | PRN

## 2016-09-25 NOTE — Progress Notes (Signed)
     Patient: Vincent Hall Male    DOB: 12/16/1965   50 y.o.   MRN: [id number] Visit Date: 09/25/2016  Today's Provider: Donald Fisher, MD   Chief Complaint  Patient presents with  . Follow-up  . Diabetes  . Hyperlipidemia   Subjective:    HPI   Diabetes Mellitus Type II, Follow-up:   Lab Results  Component Value Date   HGBA1C 7.8 09/25/2016   HGBA1C 10.8 06/13/2016   HGBA1C 9.3 02/16/2016   Last seen for diabetes 3 months ago.  Management since then includes; added glipizide. Advised to recheck in 3 months. He reports good compliance with treatment. He is not having side effects. none Current symptoms include none and have been improving. Home blood sugar records: fasting range: 150  Episodes of hypoglycemia? no   Current Insulin Regimen: n/a Most Recent Eye Exam: 06/06/2016 Weight trend: stable Prior visit with dietician: no Current diet: in general, a "healthy" diet   Current exercise: coaching (football)  ----------------------------------------------------------------  He continues to take oxycodone/apap three a day for chronic post CABG chest wall pain. He stlll has pain every day, but is manageable if he takes medications. Is tolerating well without adverse effect.  --------------------------------------------------------------   Coronary artery disease involving native coronary artery of native heart without angina pectoris From 06/13/2016-Asymptomatic. Compliant with medication.  Continue aggressive risk factor modification.  Continue routine follow up [facility] cardiology.     Allergies  Allergen Reactions  . Singulair [Montelukast Sodium] Hives     Current Outpatient Prescriptions:  .  albuterol (PROVENTIL HFA;VENTOLIN HFA) 108 (90 Base) MCG/ACT inhaler, Inhale 2 puffs into the lungs every 6 (six) hours as needed for wheezing or shortness of breath., Disp: 1 Inhaler, Rfl: 3 .  ALPRAZolam (XANAX) 0.5 MG tablet, Take 1 tablet (0.5 mg total) by mouth  2 (two) times daily as needed. for anxiety, Disp: 60 tablet, Rfl: 5 .  aspirin 81 MG tablet, Take 81 mg by mouth daily., Disp: , Rfl:  .  azelastine (ASTELIN) 0.1 % nasal spray, Place 2 sprays into both nostrils 2 (two) times daily., Disp: 30 mL, Rfl: 6 .  azelastine (OPTIVAR) 0.05 % ophthalmic solution, drop affect eye twice a day, Disp: 6 mL, Rfl: 3 .  Blood Glucose Monitoring Suppl (ONE TOUCH ULTRA SYSTEM KIT) w/Device KIT, 1 kit by Does not apply route once., Disp: 1 each, Rfl: 0 .  BREO ELLIPTA 200-25 MCG/INH AEPB, Take 1 puff by mouth daily., Disp: , Rfl: 6 .  Canagliflozin-Metformin HCl 150-500 MG TABS, Take 1 tablet by mouth 2 (two) times daily., Disp: 60 tablet, Rfl: 3 .  dexlansoprazole (DEXILANT) 60 MG capsule, Take 1 capsule (60 mg total) by mouth daily., Disp: 30 capsule, Rfl: 12 .  glipiZIDE (GLUCOTROL XL) 5 MG 24 hr tablet, Take 1 tablet (5 mg total) by mouth daily with breakfast., Disp: 30 tablet, Rfl: 3 .  glucose blood (ONE TOUCH ULTRA TEST) test strip, Check blood sugar 3 times daily E11.65 (uncontrolled insulin dependent diabetes), Disp: 100 each, Rfl: 12 .  nitroGLYCERIN (NITROSTAT) 0.4 MG SL tablet, Place under the tongue., Disp: , Rfl:  .  oxyCODONE-acetaminophen (ROXICET) 5-325 MG tablet, Take 1 tablet by mouth every 8 (eight) hours as needed for severe pain., Disp: 90 tablet, Rfl: 0 .  Probiotic Product (SOLUBLE FIBER/PROBIOTICS PO), Take by mouth once., Disp: , Rfl:  .  ranitidine (ZANTAC) 150 MG capsule, Take 150 mg by mouth as needed for heartburn., Disp: , Rfl:  .    rosuvastatin (CRESTOR) 20 MG tablet, TAKE 1 TABLET(20 MG) BY MOUTH DAILY, Disp: 30 tablet, Rfl: 11 .  sertraline (ZOLOFT) 100 MG tablet, Take 1 tablet (100 mg total) by mouth daily., Disp: 30 tablet, Rfl: 5 .  zolpidem (AMBIEN) 5 MG tablet, Take 1 tablet (5 mg total) by mouth at bedtime as needed. for sleep, Disp: 20 tablet, Rfl: 0  Review of Systems  Constitutional: Negative for appetite change, chills and  fever.  Respiratory: Negative for chest tightness, shortness of breath and wheezing.   Cardiovascular: Negative for chest pain and palpitations.  Gastrointestinal: Negative for abdominal pain, nausea and vomiting.    Social History  Substance Use Topics  . Smoking status: Former Smoker    Packs/day: 0.25    Years: 30.00    Types: Cigarettes    Quit date: 08/08/2013  . Smokeless tobacco: Never Used  . Alcohol use No   Objective:   BP 130/80 (BP Location: Right Arm, Patient Position: Sitting, Cuff Size: Normal)   Pulse 72   Temp 98.2 F (36.8 C) (Oral)   Resp 16   Ht 5' 8" (1.727 m)   Wt 199 lb (90.3 kg)   SpO2 96%   BMI 30.26 kg/m  Vitals:   09/25/16 1624  BP: 130/80  Pulse: 72  Resp: 16  Temp: 98.2 F (36.8 C)  TempSrc: Oral  SpO2: 96%  Weight: 199 lb (90.3 kg)  Height: 5' 8" (1.727 m)     Physical Exam   General Appearance:    Alert, cooperative, no distress  Eyes:    PERRL, conjunctiva/corneas clear, EOM's intact       Lungs:     Clear to auscultation bilaterally, respirations unlabored  Heart:    Regular rate and rhythm  Neurologic:   Awake, alert, oriented x 3. No apparent focal neurological           defect.       Results for orders placed or performed in visit on 09/25/16  POCT glycosylated hemoglobin (Hb A1C)  Result Value Ref Range   Hemoglobin A1C 7.8    Est. average glucose Bld gHb Est-mCnc 177        Assessment & Plan:     1. Diabetes mellitus without complication (HCC) Improving on glipizide. He states he has cut his sugar intake in half. Recommend he cut it in half again. Continue current medications.  Consider adding SGLT2 inhibitor.  - POCT glycosylated hemoglobin (Hb A1C)  2. Upper back pain on left side Chronic, stable,  Counseled to work on reducing daily dose of oxycodone/apap  - oxyCODONE-acetaminophen (ROXICET) 5-325 MG tablet; Take 1 tablet by mouth every 8 (eight) hours as needed for severe pain.  Dispense: 90 tablet; Refill:  0  3. Coronary artery disease involving native coronary artery of native heart without angina pectoris Asymptomatic. Compliant with medication.  Continue aggressive risk factor modification.    4. Chest wall pain  - oxyCODONE-acetaminophen (ROXICET) 5-325 MG tablet; Take 1 tablet by mouth every 8 (eight) hours as needed for severe pain.  Dispense: 90 tablet; Refill: 0      Return in about 3 months (around 12/26/2016).   [name], MD  [facility]

## 2016-09-26 ENCOUNTER — Telehealth: Payer: Self-pay | Admitting: Family Medicine

## 2016-09-26 NOTE — Telephone Encounter (Signed)
noted 

## 2016-09-26 NOTE — Telephone Encounter (Signed)
Pt called saying [facility] in [place] will be sending a PA over for his pain medication for insurance.  teri

## 2016-10-14 ENCOUNTER — Other Ambulatory Visit: Payer: Self-pay | Admitting: Family Medicine

## 2016-10-14 DIAGNOSIS — IMO0001 Reserved for inherently not codable concepts without codable children: Secondary | ICD-10-CM

## 2016-10-14 DIAGNOSIS — E1165 Type 2 diabetes mellitus with hyperglycemia: Principal | ICD-10-CM

## 2016-10-24 ENCOUNTER — Other Ambulatory Visit: Payer: Self-pay | Admitting: Family Medicine

## 2016-10-24 DIAGNOSIS — M549 Dorsalgia, unspecified: Secondary | ICD-10-CM

## 2016-10-24 DIAGNOSIS — R0789 Other chest pain: Secondary | ICD-10-CM

## 2016-10-24 NOTE — Telephone Encounter (Signed)
Pt needs refills on his oxycodone   Please call when ready to be picked up  Thanks Con Memos

## 2016-10-25 MED ORDER — OXYCODONE-ACETAMINOPHEN 5-325 MG PO TABS: 1.0000 | ORAL_TABLET | Freq: Three times a day (TID) | ORAL | 0 refills | Status: DC | PRN

## 2016-11-29 ENCOUNTER — Other Ambulatory Visit: Payer: Self-pay

## 2016-11-29 DIAGNOSIS — R0789 Other chest pain: Secondary | ICD-10-CM

## 2016-11-29 DIAGNOSIS — M549 Dorsalgia, unspecified: Secondary | ICD-10-CM

## 2016-11-29 NOTE — Telephone Encounter (Signed)
Last OV 09/25/16 Last RF 10/25/16

## 2016-11-29 NOTE — Telephone Encounter (Signed)
Patient called office requesting refill on prescription  Oxycodone-acetaminophen 5-325mg . Patient would like a call back when available for pick up. KW

## 2016-11-30 MED ORDER — OXYCODONE-ACETAMINOPHEN 5-325 MG PO TABS: 1.0000 | ORAL_TABLET | Freq: Three times a day (TID) | ORAL | 0 refills | Status: DC | PRN

## 2016-11-30 NOTE — Telephone Encounter (Signed)
Patient advised RX is ready for pick up.  

## 2016-12-21 ENCOUNTER — Other Ambulatory Visit: Payer: Self-pay | Admitting: Family Medicine

## 2016-12-21 DIAGNOSIS — F419 Anxiety disorder, unspecified: Secondary | ICD-10-CM

## 2016-12-21 NOTE — Telephone Encounter (Signed)
Pharmacy requesting refills. Thanks!  

## 2016-12-24 NOTE — Telephone Encounter (Signed)
Please call in alprazolam.  

## 2016-12-25 NOTE — Telephone Encounter (Signed)
Rx called in to pharmacy. 

## 2016-12-26 ENCOUNTER — Ambulatory Visit: Payer: BLUE CROSS/BLUE SHIELD | Admitting: Family Medicine

## 2016-12-26 NOTE — Progress Notes (Deleted)
Patient: Vincent Hall Male    DOB: Mar 07, 1966   51 y.o.   MRN: [id number] Visit Date: 12/26/2016  Today's Provider: [name], MD   No chief complaint on file.  Subjective:    HPI   Diabetes Mellitus Type II, Follow-up:   Lab Results  Component Value Date   HGBA1C 7.8 09/25/2016   HGBA1C 10.8 06/13/2016   HGBA1C 9.3 02/16/2016   Last seen for diabetes 3 months ago.  Management since then includes; advised to decrease sugar intake. He reports {excellent/good/fair/poor:19665} compliance with treatment. He {ACTION; IS/IS YBW:[id number]} having side effects. *** Current symptoms include {Symptoms; diabetes:14075} and have been {Desc; course:15616}. Home blood sugar records: {diabetes glucometry results:16657}  Episodes of hypoglycemia? {yes***/no:17258}   Current Insulin Regimen: *** Most Recent Eye Exam: *** Weight trend: {trend:16658} Prior visit with dietician: {yes/no:17258} Current diet: {diet habits:16563} Current exercise: {exercise types:16438}  ------------------------------------------------------------------------  Upper back pain on left side From 09/25/2016-Counseled to work on reducing daily dose of oxycodone/apap   Coronary artery disease involving native coronary artery of native heart without angina pectoris From 09/25/2016-no changes.    Chest wall pain From 09/25/2016-refilled oxyCODONE-acetaminophen (ROXICET) 5-325 MG tablet.    Allergies  Allergen Reactions  . Singulair [Montelukast Sodium] Hives     Current Outpatient Prescriptions:  .  albuterol (PROVENTIL HFA;VENTOLIN HFA) 108 (90 Base) MCG/ACT inhaler, Inhale 2 puffs into the lungs every 6 (six) hours as needed for wheezing or shortness of breath., Disp: 1 Inhaler, Rfl: 3 .  ALPRAZolam (XANAX) 0.5 MG tablet, TAKE 1 TABLET BY MOUTH TWICE DAILY AS NEEDED FOR ANXIETY, Disp: 60 tablet, Rfl: 5 .  aspirin 81 MG tablet, Take 81 mg by mouth daily., Disp: , Rfl:  .  azelastine  (ASTELIN) 0.1 % nasal spray, Place 2 sprays into both nostrils 2 (two) times daily., Disp: 30 mL, Rfl: 6 .  azelastine (OPTIVAR) 0.05 % ophthalmic solution, drop affect eye twice a day, Disp: 6 mL, Rfl: 3 .  Blood Glucose Monitoring Suppl (ONE TOUCH ULTRA SYSTEM KIT) w/Device KIT, 1 kit by Does not apply route once., Disp: 1 each, Rfl: 0 .  BREO ELLIPTA 200-25 MCG/INH AEPB, Take 1 puff by mouth daily., Disp: , Rfl: 6 .  dexlansoprazole (DEXILANT) 60 MG capsule, Take 1 capsule (60 mg total) by mouth daily., Disp: 30 capsule, Rfl: 12 .  glipiZIDE (GLUCOTROL XL) 5 MG 24 hr tablet, TAKE 1 TABLET(5 MG) BY MOUTH DAILY WITH BREAKFAST, Disp: 30 tablet, Rfl: 5 .  glucose blood (ONE TOUCH ULTRA TEST) test strip, Check blood sugar 3 times daily E11.65 (uncontrolled insulin dependent diabetes), Disp: 100 each, Rfl: 12 .  INVOKAMET 150-500 MG TABS, TAKE 1 TABLET BY MOUTH TWICE DAILY, Disp: 60 tablet, Rfl: 5 .  nitroGLYCERIN (NITROSTAT) 0.4 MG SL tablet, Place under the tongue., Disp: , Rfl:  .  oxyCODONE-acetaminophen (ROXICET) 5-325 MG tablet, Take 1 tablet by mouth every 8 (eight) hours as needed for severe pain., Disp: 90 tablet, Rfl: 0 .  Probiotic Product (SOLUBLE FIBER/PROBIOTICS PO), Take by mouth once., Disp: , Rfl:  .  ranitidine (ZANTAC) 150 MG capsule, Take 150 mg by mouth as needed for heartburn., Disp: , Rfl:  .  rosuvastatin (CRESTOR) 20 MG tablet, TAKE 1 TABLET(20 MG) BY MOUTH DAILY, Disp: 30 tablet, Rfl: 11 .  sertraline (ZOLOFT) 100 MG tablet, Take 1 tablet (100 mg total) by mouth daily., Disp: 30 tablet, Rfl: 5 .  zolpidem (AMBIEN) 5 MG tablet, Take  1 tablet (5 mg total) by mouth at bedtime as needed. for sleep, Disp: 20 tablet, Rfl: 0  Review of Systems  Constitutional: Negative for appetite change, chills and fever.  Respiratory: Negative for chest tightness, shortness of breath and wheezing.   Cardiovascular: Negative for chest pain and palpitations.  Gastrointestinal: Negative for  abdominal pain, nausea and vomiting.    Social History  Substance Use Topics  . Smoking status: Former Smoker    Packs/day: 0.25    Years: 30.00    Types: Cigarettes    Quit date: 08/08/2013  . Smokeless tobacco: Never Used  . Alcohol use No   Objective:   There were no vitals taken for this visit. There were no vitals filed for this visit.   Physical Exam      Assessment & Plan:           [name], MD  [facility]

## 2017-01-01 ENCOUNTER — Other Ambulatory Visit: Payer: Self-pay | Admitting: Family Medicine

## 2017-01-01 DIAGNOSIS — R0789 Other chest pain: Secondary | ICD-10-CM

## 2017-01-01 DIAGNOSIS — M549 Dorsalgia, unspecified: Secondary | ICD-10-CM

## 2017-01-01 NOTE — Telephone Encounter (Signed)
Pt contacted office for refill request on the following medications:  oxyCODONE-acetaminophen (ROXICET) 5-325 MG tablet  JO#[phone]/[id number]

## 2017-01-01 NOTE — Telephone Encounter (Signed)
Please review. Thanks!  

## 2017-01-02 MED ORDER — OXYCODONE-ACETAMINOPHEN 5-325 MG PO TABS: 1.0000 | ORAL_TABLET | Freq: Three times a day (TID) | ORAL | 0 refills | Status: DC | PRN

## 2017-01-31 ENCOUNTER — Encounter: Payer: Self-pay | Admitting: Family Medicine

## 2017-01-31 ENCOUNTER — Ambulatory Visit (INDEPENDENT_AMBULATORY_CARE_PROVIDER_SITE_OTHER): Payer: BLUE CROSS/BLUE SHIELD | Admitting: Family Medicine

## 2017-01-31 VITALS — BP 110/74 | HR 63 | Temp 98.3°F | Resp 16 | Wt 202.0 lb

## 2017-01-31 DIAGNOSIS — R5383 Other fatigue: Secondary | ICD-10-CM

## 2017-01-31 DIAGNOSIS — R0789 Other chest pain: Secondary | ICD-10-CM

## 2017-01-31 DIAGNOSIS — E119 Type 2 diabetes mellitus without complications: Secondary | ICD-10-CM

## 2017-01-31 DIAGNOSIS — Z23 Encounter for immunization: Secondary | ICD-10-CM | POA: Diagnosis not present

## 2017-01-31 DIAGNOSIS — M549 Dorsalgia, unspecified: Secondary | ICD-10-CM | POA: Diagnosis not present

## 2017-01-31 DIAGNOSIS — I2510 Atherosclerotic heart disease of native coronary artery without angina pectoris: Secondary | ICD-10-CM

## 2017-01-31 DIAGNOSIS — R0609 Other forms of dyspnea: Secondary | ICD-10-CM | POA: Diagnosis not present

## 2017-01-31 DIAGNOSIS — R0600 Dyspnea, unspecified: Secondary | ICD-10-CM

## 2017-01-31 LAB — POCT GLYCOSYLATED HEMOGLOBIN (HGB A1C)
Est. average glucose Bld gHb Est-mCnc: 214
Hemoglobin A1C: 9.1

## 2017-01-31 MED ORDER — OXYCODONE-ACETAMINOPHEN 5-325 MG PO TABS: 1.0000 | ORAL_TABLET | Freq: Three times a day (TID) | ORAL | 0 refills | Status: DC | PRN

## 2017-01-31 NOTE — Progress Notes (Signed)
Patient: Vincent Hall Male    DOB: 1965/04/23   51 y.o.   MRN: [id number] Visit Date: 01/31/2017  Today's Provider: [name], MD   Chief Complaint  Patient presents with  . Follow-up  . Diabetes   Subjective:    HPI   Diabetes Mellitus Type II, Follow-up:   Lab Results  Component Value Date   HGBA1C 7.8 09/25/2016   HGBA1C 10.8 06/13/2016   HGBA1C 9.3 02/16/2016   Last seen for diabetes 4 months ago.  Management since then includes; advised to decreased sugar intake. He reports good compliance with treatment. He is not having side effects. none Current symptoms include none and have been unchanged. Home blood sugar records: fasting range: 150-200  Episodes of hypoglycemia? no   Current Insulin Regimen: n/a Most Recent Eye Exam: 6 months ago at [facility] Weight trend: stable Prior visit with dietician: no Current diet: well balanced Current exercise: none  ----------------------------------------------------------------    Upper back pain on left side   Coronary artery disease involving native coronary artery of native heart without angina pectoris From 09/25/2016-no changes.   Has follow up cardiologist on November second. Has been having more dyspnea on exertion for 2-3 months. Episode yesterday of getting light headed and nearly falling after getting up out of chair.   Wt Readings from Last 3 Encounters:  01/31/17 202 lb (91.6 kg)  09/25/16 199 lb (90.3 kg)  06/13/16 194 lb (88 kg)    Chest wall pain From 09/25/2016-refilled oxyCODONE-acetaminophen (ROXICET) 5-325 MG tablet which remains effective, but pain persists and has not been able to substantially reduce dose.    Allergies  Allergen Reactions  . Singulair [Montelukast Sodium] Hives     Current Outpatient Prescriptions:  .  albuterol (PROVENTIL HFA;VENTOLIN HFA) 108 (90 Base) MCG/ACT inhaler, Inhale 2 puffs into the lungs every 6 (six) hours as needed for wheezing or  shortness of breath., Disp: 1 Inhaler, Rfl: 3 .  ALPRAZolam (XANAX) 0.5 MG tablet, TAKE 1 TABLET BY MOUTH TWICE DAILY AS NEEDED FOR ANXIETY, Disp: 60 tablet, Rfl: 5 .  aspirin 81 MG tablet, Take 81 mg by mouth daily., Disp: , Rfl:  .  azelastine (ASTELIN) 0.1 % nasal spray, Place 2 sprays into both nostrils 2 (two) times daily., Disp: 30 mL, Rfl: 6 .  azelastine (OPTIVAR) 0.05 % ophthalmic solution, drop affect eye twice a day, Disp: 6 mL, Rfl: 3 .  Blood Glucose Monitoring Suppl (ONE TOUCH ULTRA SYSTEM KIT) w/Device KIT, 1 kit by Does not apply route once., Disp: 1 each, Rfl: 0 .  BREO ELLIPTA 200-25 MCG/INH AEPB, Take 1 puff by mouth daily., Disp: , Rfl: 6 .  dexlansoprazole (DEXILANT) 60 MG capsule, Take 1 capsule (60 mg total) by mouth daily., Disp: 30 capsule, Rfl: 12 .  glipiZIDE (GLUCOTROL XL) 5 MG 24 hr tablet, TAKE 1 TABLET(5 MG) BY MOUTH DAILY WITH BREAKFAST, Disp: 30 tablet, Rfl: 5 .  glucose blood (ONE TOUCH ULTRA TEST) test strip, Check blood sugar 3 times daily E11.65 (uncontrolled insulin dependent diabetes), Disp: 100 each, Rfl: 12 .  INVOKAMET 150-500 MG TABS, TAKE 1 TABLET BY MOUTH TWICE DAILY, Disp: 60 tablet, Rfl: 5 .  nitroGLYCERIN (NITROSTAT) 0.4 MG SL tablet, Place under the tongue., Disp: , Rfl:  .  oxyCODONE-acetaminophen (ROXICET) 5-325 MG tablet, Take 1 tablet by mouth every 8 (eight) hours as needed for severe pain., Disp: 90 tablet, Rfl: 0 .  Probiotic Product (SOLUBLE FIBER/PROBIOTICS PO),  Take by mouth once., Disp: , Rfl:  .  ranitidine (ZANTAC) 150 MG capsule, Take 150 mg by mouth as needed for heartburn., Disp: , Rfl:  .  rosuvastatin (CRESTOR) 20 MG tablet, TAKE 1 TABLET(20 MG) BY MOUTH DAILY, Disp: 30 tablet, Rfl: 11 .  sertraline (ZOLOFT) 100 MG tablet, Take 1 tablet (100 mg total) by mouth daily., Disp: 30 tablet, Rfl: 5 .  zolpidem (AMBIEN) 5 MG tablet, Take 1 tablet (5 mg total) by mouth at bedtime as needed. for sleep, Disp: 20 tablet, Rfl: 0  Review of  Systems  Constitutional: Negative for appetite change, chills and fever.  Respiratory: Negative for chest tightness, shortness of breath and wheezing.   Cardiovascular: Negative for chest pain and palpitations.  Gastrointestinal: Negative for abdominal pain, nausea and vomiting.    Social History  Substance Use Topics  . Smoking status: Former Smoker    Packs/day: 0.25    Years: 30.00    Types: Cigarettes    Quit date: 08/08/2013  . Smokeless tobacco: Never Used  . Alcohol use No   Objective:   BP 110/74 (BP Location: Right Arm, Patient Position: Sitting, Cuff Size: Large)   Pulse 63   Temp 98.3 F (36.8 C) (Oral)   Resp 16   Wt 202 lb (91.6 kg)   SpO2 97%   BMI 30.71 kg/m  Vitals:   01/31/17 1126  BP: 110/74  Pulse: 63  Resp: 16  Temp: 98.3 F (36.8 C)  TempSrc: Oral  SpO2: 97%  Weight: 202 lb (91.6 kg)     Physical Exam   General Appearance:    Alert, cooperative, no distress  Eyes:    PERRL, conjunctiva/corneas clear, EOM's intact       Lungs:     Clear to auscultation bilaterally, respirations unlabored  Heart:    Regular rate and rhythm  Neurologic:   Awake, alert, oriented x 3. No apparent focal neurological           defect.         Results for orders placed or performed in visit on 01/31/17  POCT glycosylated hemoglobin (Hb A1C)  Result Value Ref Range   Hemoglobin A1C 9.1    Est. average glucose Bld gHb Est-mCnc 214        Assessment & Plan:     1. Diabetes mellitus without complication ([place]) He reports good compliance with medications yet A1c is worsening. I suspect worsening sugars due to inability to be physically active due to fatigue and dyspnea as below. Continue current medications for now.  - POCT glycosylated hemoglobin (Hb A1C)  2. Coronary artery disease involving native coronary artery of native heart without angina pectoris Follow up cardiology in November as scheduled.  - Lipid panel  3. Dyspnea on exertion  - Brain  natriuretic peptide  4. Need for influenza vaccination  - Flu Vaccine QUAD 36+ mos IM  5. Upper back pain on left side  - oxyCODONE-acetaminophen (ROXICET) 5-325 MG tablet; Take 1 tablet by mouth every 8 (eight) hours as needed for severe pain.  Dispense: 90 tablet; Refill: 0  6. Chest wall pain  - oxyCODONE-acetaminophen (ROXICET) 5-325 MG tablet; Take 1 tablet by mouth every 8 (eight) hours as needed for severe pain.  Dispense: 90 tablet; Refill: 0  7. Other fatigue  - COMPLETE METABOLIC PANEL WITH GFR - CBC - TSH          [name], MD  [facility]

## 2017-02-01 ENCOUNTER — Telehealth: Payer: Self-pay | Admitting: *Deleted

## 2017-02-01 DIAGNOSIS — R0602 Shortness of breath: Secondary | ICD-10-CM

## 2017-02-01 LAB — TSH: TSH: 1.9 mIU/L (ref 0.40–4.50)

## 2017-02-01 LAB — LIPID PANEL
Cholesterol: 212 mg/dL — ABNORMAL HIGH (ref <200)
HDL: 56 mg/dL (ref >40)
LDL Cholesterol (Calc): 124 mg/dL (calc) — ABNORMAL HIGH
Non-HDL Cholesterol (Calc): 156 mg/dL (calc) — ABNORMAL HIGH (ref <130)
Total CHOL/HDL Ratio: 3.8 (calc) (ref <5.0)
Triglycerides: 196 mg/dL — ABNORMAL HIGH (ref <150)

## 2017-02-01 LAB — COMPLETE METABOLIC PANEL WITH GFR
AG Ratio: 1.8 (calc) (ref 1.0–2.5)
ALT: 40 U/L (ref 9–46)
AST: 21 U/L (ref 10–35)
Albumin: 4.8 g/dL (ref 3.6–5.1)
Alkaline phosphatase (APISO): 59 U/L (ref 40–115)
BUN: 14 mg/dL (ref 7–25)
CO2: 28 mmol/L (ref 20–32)
Calcium: 10.2 mg/dL (ref 8.6–10.3)
Chloride: 100 mmol/L (ref 98–110)
Creat: 0.93 mg/dL (ref 0.70–1.33)
GFR, Est African American: 109 mL/min/{1.73_m2} (ref >=60)
GFR, Est Non African American: 94 mL/min/{1.73_m2} (ref >=60)
Globulin: 2.6 g/dL (calc) (ref 1.9–3.7)
Glucose, Bld: 167 mg/dL — ABNORMAL HIGH (ref 65–139)
Potassium: 4.4 mmol/L (ref 3.5–5.3)
Sodium: 135 mmol/L (ref 135–146)
Total Bilirubin: 0.4 mg/dL (ref 0.2–1.2)
Total Protein: 7.4 g/dL (ref 6.1–8.1)

## 2017-02-01 LAB — CBC
HCT: 47.4 % (ref 38.5–50.0)
Hemoglobin: 15.8 g/dL (ref 13.2–17.1)
MCH: 27.7 pg (ref 27.0–33.0)
MCHC: 33.3 g/dL (ref 32.0–36.0)
MCV: 83.2 fL (ref 80.0–100.0)
MPV: 11.1 fL (ref 7.5–12.5)
Platelets: 268 10*3/uL (ref 140–400)
RBC: 5.7 10*6/uL (ref 4.20–5.80)
RDW: 12.6 % (ref 11.0–15.0)
WBC: 8.5 10*3/uL (ref 3.8–10.8)

## 2017-02-01 LAB — BRAIN NATRIURETIC PEPTIDE: Brain Natriuretic Peptide: 5 pg/mL (ref <100)

## 2017-02-01 MED ORDER — ROSUVASTATIN CALCIUM 40 MG PO TABS: 40.0000 mg | ORAL_TABLET | Freq: Every day | ORAL | 4 refills | Status: DC

## 2017-02-01 NOTE — Telephone Encounter (Signed)
-----   Message from Birdie Sons, MD sent at 02/01/2017  1:02 PM EDT ----- Labs are all normal except for high blood sugar and cholesterol. Cardiac functions are normal. Recommend chest XR for shortness of breath. Need to increase rosuvastatin tom 40 mg once a day, #30, rf x 4.schedule follow up for diabetes and cholesterol in 3 months.

## 2017-02-01 NOTE — Telephone Encounter (Signed)
Patient was notified of results. Rx sent to pharmacy. X-ray ordered.

## 2017-02-27 ENCOUNTER — Telehealth: Payer: Self-pay | Admitting: Family Medicine

## 2017-02-27 ENCOUNTER — Ambulatory Visit
Admission: RE | Admit: 2017-02-27 | Discharge: 2017-02-27 | Disposition: A | Discharge status: Home / Self Care | Payer: BLUE CROSS/BLUE SHIELD | Source: Ambulatory Visit | Attending: Family Medicine | Admitting: Family Medicine

## 2017-02-27 DIAGNOSIS — I517 Cardiomegaly: Secondary | ICD-10-CM | POA: Insufficient documentation

## 2017-02-27 DIAGNOSIS — R0602 Shortness of breath: Secondary | ICD-10-CM | POA: Diagnosis not present

## 2017-02-27 DIAGNOSIS — M549 Dorsalgia, unspecified: Secondary | ICD-10-CM

## 2017-02-27 DIAGNOSIS — R0789 Other chest pain: Secondary | ICD-10-CM

## 2017-02-27 MED ORDER — OXYCODONE-ACETAMINOPHEN 5-325 MG PO TABS: 1.0000 | ORAL_TABLET | Freq: Three times a day (TID) | ORAL | 0 refills | Status: DC | PRN

## 2017-02-27 NOTE — Telephone Encounter (Signed)
Please advise sample? Also patient is requesting a refill for oxycodone-ace 5-325 mg. [facility].

## 2017-02-27 NOTE — Telephone Encounter (Signed)
He can have a Humalog U-100 pen

## 2017-02-27 NOTE — Telephone Encounter (Signed)
Please advise patient that oxycodone/apap  prescription has been sent electronically to [place], [place] -  He is on insulin at this time.

## 2017-02-27 NOTE — Telephone Encounter (Signed)
Left message on pt's vm, notifying pt sample is ready to pick-up.

## 2017-02-27 NOTE — Telephone Encounter (Signed)
Patient is here with his wife who is seeing Tawanna Sat right now.    He wants another sample of the "quick acting insulin pen" you gave him as a sample.   He does not know the name of pen and Ive looked to find it in his chart but do not see it listed.   He will be back to get this after his chest xray across the street.

## 2017-02-27 NOTE — Telephone Encounter (Signed)
Patient stated that he has been using the insulin when his sugar gets up close to 300. Patient said that he only has had to use insulin every few weeks. He's had the same pin for months.

## 2017-03-05 ENCOUNTER — Telehealth: Payer: Self-pay | Admitting: Family Medicine

## 2017-03-05 NOTE — Telephone Encounter (Signed)
Pt is requesting x-ray results.  JY#[phone]/[id number]

## 2017-03-05 NOTE — Telephone Encounter (Signed)
Busy/Will try again later

## 2017-03-07 ENCOUNTER — Telehealth: Payer: Self-pay

## 2017-03-07 NOTE — Telephone Encounter (Signed)
Patient was advised. KW 

## 2017-03-07 NOTE — Telephone Encounter (Signed)
-----   Message from Birdie Sons, MD sent at 02/28/2017  9:24 AM EST ----- Xray of heart and lungs is completely normal. If he is still having any shortness of breath then need to check D-dimer to make sure there are not blood clots in lungs.

## 2017-03-07 NOTE — Telephone Encounter (Signed)
Patient advised.KW 

## 2017-03-09 ENCOUNTER — Other Ambulatory Visit: Payer: Self-pay | Admitting: Family Medicine

## 2017-03-09 MED ORDER — RABEPRAZOLE SODIUM 20 MG PO TBEC: 20.0000 mg | DELAYED_RELEASE_TABLET | Freq: Every day | ORAL | 12 refills | Status: DC

## 2017-03-09 NOTE — Progress Notes (Signed)
Change Dexilant to rabeprazole 20mg  daily due to PA

## 2017-03-09 NOTE — Telephone Encounter (Signed)
Pt needs refill on his dexilant 60mg   [facility] uses [facility] [place]  pts call back is [phone]  Thanks  teri

## 2017-03-23 DIAGNOSIS — I25810 Atherosclerosis of coronary artery bypass graft(s) without angina pectoris: Secondary | ICD-10-CM | POA: Diagnosis not present

## 2017-03-23 DIAGNOSIS — E782 Mixed hyperlipidemia: Secondary | ICD-10-CM | POA: Diagnosis not present

## 2017-03-23 DIAGNOSIS — I10 Essential (primary) hypertension: Secondary | ICD-10-CM | POA: Diagnosis not present

## 2017-03-23 DIAGNOSIS — Z6829 Body mass index (BMI) 29.0-29.9, adult: Secondary | ICD-10-CM | POA: Diagnosis not present

## 2017-04-05 ENCOUNTER — Other Ambulatory Visit: Payer: Self-pay | Admitting: Family Medicine

## 2017-04-05 DIAGNOSIS — M549 Dorsalgia, unspecified: Secondary | ICD-10-CM

## 2017-04-05 DIAGNOSIS — R0789 Other chest pain: Secondary | ICD-10-CM

## 2017-04-05 MED ORDER — OXYCODONE-ACETAMINOPHEN 5-325 MG PO TABS: 1.0000 | ORAL_TABLET | Freq: Three times a day (TID) | ORAL | 0 refills | Status: DC | PRN

## 2017-04-05 NOTE — Telephone Encounter (Signed)
pt needs refill on his oxycodone 5-325  He uses [facility]

## 2017-04-24 ENCOUNTER — Telehealth: Payer: Self-pay | Admitting: Family Medicine

## 2017-04-24 NOTE — Telephone Encounter (Signed)
Patient states that he was on dexilant and the insurance stopped covering it.  You changed him to RABEprazole (ACIPHEX) 20 MG tablet.  He states that it is not working for him at all.  He states that he called his insurance and they told him for you to call them to request for  him to get back on dexilant.  The number is [phone] to [facility]

## 2017-04-25 MED ORDER — DEXLANSOPRAZOLE 60 MG PO CPDR: 60.0000 mg | DELAYED_RELEASE_CAPSULE | Freq: Every day | ORAL | 12 refills | Status: DC

## 2017-04-25 NOTE — Telephone Encounter (Signed)
[place] for patient to start back on dexilant? Please advise. Thanks!

## 2017-04-27 ENCOUNTER — Other Ambulatory Visit: Payer: Self-pay | Admitting: Family Medicine

## 2017-04-30 ENCOUNTER — Other Ambulatory Visit: Payer: Self-pay | Admitting: Family Medicine

## 2017-04-30 DIAGNOSIS — M549 Dorsalgia, unspecified: Secondary | ICD-10-CM

## 2017-04-30 DIAGNOSIS — R0789 Other chest pain: Secondary | ICD-10-CM

## 2017-04-30 MED ORDER — OXYCODONE-ACETAMINOPHEN 5-325 MG PO TABS: 1.0000 | ORAL_TABLET | Freq: Three times a day (TID) | ORAL | 0 refills | Status: DC | PRN

## 2017-04-30 NOTE — Telephone Encounter (Signed)
Patient needs refill on Oxycodone 5/325 and Ambien 5 mg. To [facility] in [place]

## 2017-05-01 ENCOUNTER — Telehealth: Payer: Self-pay | Admitting: Family Medicine

## 2017-05-01 DIAGNOSIS — R0789 Other chest pain: Secondary | ICD-10-CM

## 2017-05-01 DIAGNOSIS — M549 Dorsalgia, unspecified: Secondary | ICD-10-CM

## 2017-05-01 MED ORDER — OXYCODONE-ACETAMINOPHEN 5-325 MG PO TABS: 1.0000 | ORAL_TABLET | Freq: Four times a day (QID) | ORAL | 0 refills | Status: DC | PRN

## 2017-05-01 NOTE — Telephone Encounter (Signed)
Pt stated that we sent an Rx for oxyCODONE-acetaminophen (ROXICET) 5-325 MG tablet [name] on 04/30/17. Pt stated [facility] advised pt their policy for this schedule medication is to only allow getting the medication 2 days earlier than it is due. Pt stated he got his last medication on 04/05/17 and December has 31 days in the month. Pt stated he is out of the medication and is requesting that the Rx at [facility] be canceled and a new Rx be sent to [facility].   Pt stated there are times that he has to take more than 3 pills in one day. Pt stated that he contacted his insurance and they advised that they will cover the medication if it was picked up today. Please advise. Thanks TNP

## 2017-05-01 NOTE — Telephone Encounter (Signed)
Patient advised. Rx canceled at [facility].

## 2017-05-01 NOTE — Telephone Encounter (Signed)
[facility] Please call [facility] and cancel prescription.

## 2017-05-01 NOTE — Telephone Encounter (Signed)
Please advise 

## 2017-05-04 ENCOUNTER — Ambulatory Visit (INDEPENDENT_AMBULATORY_CARE_PROVIDER_SITE_OTHER): Payer: BLUE CROSS/BLUE SHIELD | Admitting: Family Medicine

## 2017-05-04 ENCOUNTER — Encounter: Payer: Self-pay | Admitting: Family Medicine

## 2017-05-04 VITALS — BP 126/78 | HR 69 | Temp 98.8°F | Resp 16 | Ht 68.0 in | Wt 206.0 lb

## 2017-05-04 DIAGNOSIS — R0789 Other chest pain: Secondary | ICD-10-CM | POA: Diagnosis not present

## 2017-05-04 DIAGNOSIS — M549 Dorsalgia, unspecified: Secondary | ICD-10-CM

## 2017-05-04 DIAGNOSIS — E1165 Type 2 diabetes mellitus with hyperglycemia: Secondary | ICD-10-CM

## 2017-05-04 DIAGNOSIS — G4700 Insomnia, unspecified: Secondary | ICD-10-CM

## 2017-05-04 DIAGNOSIS — I2510 Atherosclerotic heart disease of native coronary artery without angina pectoris: Secondary | ICD-10-CM

## 2017-05-04 DIAGNOSIS — M6208 Separation of muscle (nontraumatic), other site: Secondary | ICD-10-CM

## 2017-05-04 LAB — POCT GLYCOSYLATED HEMOGLOBIN (HGB A1C)
Est. average glucose Bld gHb Est-mCnc: 237
Hemoglobin A1C: 9.9

## 2017-05-04 MED ORDER — OXYCODONE-ACETAMINOPHEN 7.5-325 MG PO TABS: 1.0000 | ORAL_TABLET | Freq: Four times a day (QID) | ORAL | 0 refills | Status: DC | PRN

## 2017-05-04 NOTE — Patient Instructions (Signed)
It is recommended to engage in 150 minutes of moderate exercise every week.   Avoid sweets and reduce portion sizes of meals to lose 1-2 pound a week until your weight is below 185.

## 2017-05-04 NOTE — Progress Notes (Signed)
Patient: Vincent Hall Male    DOB: 11-Feb-1966   52 y.o.   MRN: [id number] Visit Date: 05/04/2017  Today's Provider: [name], MD   Chief Complaint  Patient presents with  . Coronary Artery Disease    follow up  . Hyperlipidemia    follow up  . Diabetes    follow up   Subjective:    HPI Follow up of CAD:  Patient was last seen for this problem 3 months ago and no changes were made. Patient was advised to follow up with Cardiology as scheduled. Patient reports that he was seen by his Cardiologist (Dr. Annamaria Boots) 1 month ago.   Diabetes Mellitus Type II, Follow-up:   Lab Results  Component Value Date   HGBA1C 9.1 01/31/2017   HGBA1C 7.8 09/25/2016   HGBA1C 10.8 06/13/2016    Last seen for diabetes 3 months ago.  Management since then includes no changes. He reports good compliance with treatment. He is not having side effects.  Current symptoms include none and have been stable. Home blood sugar records: 150-200 random glucose  Episodes of hypoglycemia? no   Current Insulin Regimen: none Most Recent Eye Exam: <1 year ago Weight trend: fluctuating a bit Prior visit with dietician: no Current diet: well balanced Current exercise: none  Pertinent Labs:    Component Value Date/Time   CHOL 212 (H) 01/31/2017 1230   CHOL 202 (H) 06/13/2016 0917   TRIG 196 (H) 01/31/2017 1230   HDL 56 01/31/2017 1230   HDL 48 06/13/2016 0917   LDLCALC 113 (H) 06/13/2016 0917   CREATININE 0.93 01/31/2017 1230    Wt Readings from Last 3 Encounters:  01/31/17 202 lb (91.6 kg)  09/25/16 199 lb (90.3 kg)  06/13/16 194 lb (88 kg)    ------------------------------------------------------------------------  Lipid/Cholesterol, Follow-up:   Last seen for this 3 months ago.  Management changes since that visit include increasing Rosuvastatin to 40m daily. . Last Lipid Panel:    Component Value Date/Time   CHOL 212 (H) 01/31/2017 1230   CHOL 202 (H) 06/13/2016 0917     TRIG 196 (H) 01/31/2017 1230   HDL 56 01/31/2017 1230   HDL 48 06/13/2016 0917   CHOLHDL 3.8 01/31/2017 1230   LDLCALC 113 (H) 06/13/2016 0917    Risk factors for vascular disease include diabetes mellitus and hypercholesterolemia  He reports good compliance with treatment. He is not having side effects.  Current symptoms include none and have been stable. Weight trend: fluctuating a bit Prior visit with dietician: no Current diet: well balanced Current exercise: none  Wt Readings from Last 3 Encounters:  01/31/17 202 lb (91.6 kg)  09/25/16 199 lb (90.3 kg)  06/13/16 194 lb (88 kg)    -------------------------------------------------------------------  Follow up chronic chest wall pain.  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 He continues on oxycodone/apap which he reports he has to take four times just about every day to keep manageable. Is tolerating well without adverse effects.   He is also concerned he may have hernia since he noticed a bulge mid abdomen when he sits up.     Allergies  Allergen Reactions  . Singulair [Montelukast Sodium] Hives     Current Outpatient Medications:  .  albuterol (PROVENTIL HFA;VENTOLIN HFA) 108 (90 Base) MCG/ACT inhaler, Inhale 2 puffs into the lungs every 6 (six) hours as needed for wheezing or shortness of breath., Disp: 1 Inhaler, Rfl: 3 .  ALPRAZolam (Duanne Moron  0.5 MG tablet, TAKE 1 TABLET BY MOUTH TWICE DAILY AS NEEDED FOR ANXIETY, Disp: 60 tablet, Rfl: 5 .  aspirin 81 MG tablet, Take 81 mg by mouth daily., Disp: , Rfl:  .  azelastine (ASTELIN) 0.1 % nasal spray, Place 2 sprays into both nostrils 2 (two) times daily., Disp: 30 mL, Rfl: 6 .  azelastine (OPTIVAR) 0.05 % ophthalmic solution, drop affect eye twice a day, Disp: 6 mL, Rfl: 3 .  Blood Glucose Monitoring Suppl (ONE TOUCH ULTRA SYSTEM KIT) w/Device KIT, 1 kit by Does not apply route once., Disp: 1 each, Rfl: 0 .  BREO ELLIPTA 200-25 MCG/INH AEPB, Take 1 puff  by mouth daily., Disp: , Rfl: 6 .  dexlansoprazole (DEXILANT) 60 MG capsule, Take 1 capsule (60 mg total) by mouth daily., Disp: 30 capsule, Rfl: 12 .  glipiZIDE (GLUCOTROL XL) 5 MG 24 hr tablet, TAKE 1 TABLET(5 MG) BY MOUTH DAILY WITH BREAKFAST, Disp: 30 tablet, Rfl: 5 .  glucose blood (ONE TOUCH ULTRA TEST) test strip, Check blood sugar 3 times daily E11.65 (uncontrolled insulin dependent diabetes), Disp: 100 each, Rfl: 12 .  INVOKAMET 150-500 MG TABS, TAKE 1 TABLET BY MOUTH TWICE DAILY, Disp: 60 tablet, Rfl: 5 .  nitroGLYCERIN (NITROSTAT) 0.4 MG SL tablet, Place under the tongue., Disp: , Rfl:  .  oxyCODONE-acetaminophen (ROXICET) 5-325 MG tablet, Take 1 tablet by mouth every 6 (six) hours as needed for severe pain., Disp: 100 tablet, Rfl: 0 .  Probiotic Product (SOLUBLE FIBER/PROBIOTICS PO), Take by mouth once., Disp: , Rfl:  .  ranitidine (ZANTAC) 150 MG capsule, Take 150 mg by mouth as needed for heartburn., Disp: , Rfl:  .  rosuvastatin (CRESTOR) 40 MG tablet, Take 1 tablet (40 mg total) by mouth daily., Disp: 30 tablet, Rfl: 4 .  sertraline (ZOLOFT) 100 MG tablet, Take 1 tablet (100 mg total) by mouth daily., Disp: 30 tablet, Rfl: 5 .  zolpidem (AMBIEN) 5 MG tablet, Take 1 tablet (5 mg total) by mouth at bedtime as needed. for sleep, Disp: 20 tablet, Rfl: 0  Review of Systems  Constitutional: Positive for fatigue. Negative for appetite change, chills and fever.  Respiratory: Positive for shortness of breath. Negative for chest tightness and wheezing.   Cardiovascular: Negative for chest pain, palpitations and leg swelling.  Gastrointestinal: Negative for abdominal pain, nausea and vomiting.       Bulge of abdomen  Neurological: Positive for dizziness (occasionally).    Social History   Tobacco Use  . Smoking status: Former Smoker    Packs/day: 0.25    Years: 30.00    Pack years: 7.50    Types: Cigarettes    Last attempt to quit: 08/08/2013    Years since quitting: 3.7  .  Smokeless tobacco: Never Used  Substance Use Topics  . Alcohol use: No   Objective:   BP 126/78 (BP Location: Left Arm, Patient Position: Sitting, Cuff Size: Large)   Pulse 69   Temp 98.8 F (37.1 C) (Oral)   Resp 16   Ht '5\' 8"'  (1.727 m)   Wt 206 lb (93.4 kg)   SpO2 96% Comment: room air  BMI 31.32 kg/m     Physical Exam   General Appearance:    Alert, cooperative, no distress  Eyes:    PERRL, conjunctiva/corneas clear, EOM's intact       Lungs:     Clear to auscultation bilaterally, respirations unlabored  Heart:    Regular rate and rhythm  Neurologic:  Awake, alert, oriented x 3. No apparent focal neurological           defect.   Abd:  Prominent non-tender diastasis recti.    Results for orders placed or performed in visit on 05/04/17  POCT HgB A1C  Result Value Ref Range   Hemoglobin A1C 9.9    Est. average glucose Bld gHb Est-mCnc 237        Assessment & Plan:     1. Uncontrolled type 2 diabetes mellitus with hyperglycemia ([place]) Discussed possible changing SGLT-2 inhibitors versus starting another medication such as Victoza. He states he has not been following diet consistently and can do much better. He would like to work on diet before deciding whether to add another medication - POCT HgB A1C  2. Diastasis of rectus abdominis Reassured of benign nature of condition and does not require surgery correction.   3. Upper back pain on left side Likely muscle spasm. Call if not improving over the next few weeks.   4. Chest wall pain Fairly well controlled. refille - oxyCODONE-acetaminophen (PERCOCET) 7.5-325 MG tablet; Take 1 tablet by mouth every 6 (six) hours as needed for severe pain.  Dispense: 100 tablet; Refill: 0  5. Coronary artery disease involving native coronary artery of native heart without angina pectoris Asymptomatic. Compliant with medication.  Continue aggressive risk factor modification.  Continue regular follow up with cardiology.   6.  Insomnia, unspecified type Doing well with zolpidem. He is not sure if he needs refill, will call back  Call if symptoms change or if not rapidly improving.     Return in about 4 months (around 09/01/2017).        [name], MD  [facility]

## 2017-05-11 ENCOUNTER — Other Ambulatory Visit: Payer: Self-pay | Admitting: Family Medicine

## 2017-05-11 DIAGNOSIS — I25810 Atherosclerosis of coronary artery bypass graft(s) without angina pectoris: Secondary | ICD-10-CM | POA: Diagnosis not present

## 2017-05-11 DIAGNOSIS — E782 Mixed hyperlipidemia: Secondary | ICD-10-CM | POA: Diagnosis not present

## 2017-05-11 DIAGNOSIS — I10 Essential (primary) hypertension: Secondary | ICD-10-CM | POA: Diagnosis not present

## 2017-05-11 NOTE — Telephone Encounter (Signed)
Pharmacy requesting refills. Thanks!  

## 2017-06-04 DIAGNOSIS — I25810 Atherosclerosis of coronary artery bypass graft(s) without angina pectoris: Secondary | ICD-10-CM | POA: Diagnosis not present

## 2017-06-05 DIAGNOSIS — I25810 Atherosclerosis of coronary artery bypass graft(s) without angina pectoris: Secondary | ICD-10-CM | POA: Diagnosis not present

## 2017-06-05 DIAGNOSIS — E782 Mixed hyperlipidemia: Secondary | ICD-10-CM | POA: Diagnosis not present

## 2017-06-05 DIAGNOSIS — I10 Essential (primary) hypertension: Secondary | ICD-10-CM | POA: Diagnosis not present

## 2017-06-08 ENCOUNTER — Other Ambulatory Visit: Payer: Self-pay

## 2017-06-08 ENCOUNTER — Encounter: Payer: Self-pay | Admitting: Emergency Medicine

## 2017-06-08 ENCOUNTER — Emergency Department
Admission: EM | Admit: 2017-06-08 | Discharge: 2017-06-08 | Disposition: A | Discharge status: Home / Self Care | Payer: Worker's Compensation | Attending: Emergency Medicine | Admitting: Emergency Medicine

## 2017-06-08 DIAGNOSIS — I252 Old myocardial infarction: Secondary | ICD-10-CM | POA: Insufficient documentation

## 2017-06-08 DIAGNOSIS — Z951 Presence of aortocoronary bypass graft: Secondary | ICD-10-CM | POA: Insufficient documentation

## 2017-06-08 DIAGNOSIS — F410 Panic disorder [episodic paroxysmal anxiety] without agoraphobia: Secondary | ICD-10-CM | POA: Insufficient documentation

## 2017-06-08 DIAGNOSIS — Z7982 Long term (current) use of aspirin: Secondary | ICD-10-CM | POA: Diagnosis not present

## 2017-06-08 DIAGNOSIS — F430 Acute stress reaction: Secondary | ICD-10-CM | POA: Diagnosis not present

## 2017-06-08 DIAGNOSIS — J45909 Unspecified asthma, uncomplicated: Secondary | ICD-10-CM | POA: Insufficient documentation

## 2017-06-08 DIAGNOSIS — Z79899 Other long term (current) drug therapy: Secondary | ICD-10-CM | POA: Insufficient documentation

## 2017-06-08 DIAGNOSIS — Z87891 Personal history of nicotine dependence: Secondary | ICD-10-CM | POA: Diagnosis not present

## 2017-06-08 DIAGNOSIS — I2510 Atherosclerotic heart disease of native coronary artery without angina pectoris: Secondary | ICD-10-CM | POA: Insufficient documentation

## 2017-06-08 DIAGNOSIS — F419 Anxiety disorder, unspecified: Secondary | ICD-10-CM | POA: Diagnosis not present

## 2017-06-08 DIAGNOSIS — R079 Chest pain, unspecified: Secondary | ICD-10-CM | POA: Diagnosis not present
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
## 2017-06-14 ENCOUNTER — Other Ambulatory Visit: Payer: Self-pay | Admitting: Family Medicine

## 2017-06-14 DIAGNOSIS — F419 Anxiety disorder, unspecified: Secondary | ICD-10-CM

## 2017-06-14 DIAGNOSIS — F410 Panic disorder [episodic paroxysmal anxiety] without agoraphobia: Secondary | ICD-10-CM

## 2017-06-14 DIAGNOSIS — R0789 Other chest pain: Secondary | ICD-10-CM

## 2017-06-14 MED ORDER — OXYCODONE-ACETAMINOPHEN 7.5-325 MG PO TABS: 1.0000 | ORAL_TABLET | Freq: Four times a day (QID) | ORAL | 0 refills | Status: DC | PRN

## 2017-07-06 ENCOUNTER — Other Ambulatory Visit: Payer: Self-pay | Admitting: Family Medicine

## 2017-07-06 DIAGNOSIS — F419 Anxiety disorder, unspecified: Secondary | ICD-10-CM

## 2017-07-06 DIAGNOSIS — E1165 Type 2 diabetes mellitus with hyperglycemia: Principal | ICD-10-CM

## 2017-07-06 DIAGNOSIS — IMO0001 Reserved for inherently not codable concepts without codable children: Secondary | ICD-10-CM

## 2017-07-06 DIAGNOSIS — R0789 Other chest pain: Secondary | ICD-10-CM

## 2017-07-06 MED ORDER — OXYCODONE-ACETAMINOPHEN 7.5-325 MG PO TABS: 1.0000 | ORAL_TABLET | Freq: Four times a day (QID) | ORAL | 0 refills | Status: DC | PRN

## 2017-07-06 NOTE — Telephone Encounter (Signed)
Pt requesting refill of Oxycodone 7.5-325 sent into [facility]

## 2017-07-08 ENCOUNTER — Other Ambulatory Visit: Payer: Self-pay | Admitting: Family Medicine

## 2017-07-20 ENCOUNTER — Other Ambulatory Visit: Payer: Self-pay | Admitting: Family Medicine

## 2017-07-20 DIAGNOSIS — F419 Anxiety disorder, unspecified: Secondary | ICD-10-CM

## 2017-07-20 DIAGNOSIS — F410 Panic disorder [episodic paroxysmal anxiety] without agoraphobia: Secondary | ICD-10-CM

## 2017-07-25 ENCOUNTER — Telehealth: Payer: Self-pay

## 2017-07-25 NOTE — Telephone Encounter (Signed)
Patient called to report ongoing episodes of dizziness that comes and goes. Patient reports he has been dizzy since last night. He checked his blood sugar last night it was 256. He took 16 units of insulin (Humalog). Blood sugar was 246 this morning. He took 10 units of Humalog. He checked sugar again at 11 am and it was 201, so he took 10 more units of Humalog. While speaking with patient he check sugar and blood pressure. Sugar was 202 and BP was 147/85. Patient reports taking Glipizide, Invokamet, and Humalog. He is not taking Victoza. Patient reports when he has the dizzy spells he feels like "blood is rushing to his head"  Patient had a cardiology workup on 05/11/17 and per patient he had a Holter monitor and everything was fine.   Patient denies visual disturbance. Discussed with Adriana and Dr. Caryn Section. Patient scheduled for 07/26/17. Patient advised to go tot the ER if he develops any visual changes.Patient also advised to continue monitoring blood sugars and BP. Patient verbalized understanding.

## 2017-07-26 ENCOUNTER — Ambulatory Visit (INDEPENDENT_AMBULATORY_CARE_PROVIDER_SITE_OTHER): Payer: BLUE CROSS/BLUE SHIELD | Admitting: Family Medicine

## 2017-07-26 ENCOUNTER — Encounter: Payer: Self-pay | Admitting: Family Medicine

## 2017-07-26 VITALS — BP 120/68 | HR 66 | Temp 97.9°F | Resp 16 | Wt 200.0 lb

## 2017-07-26 DIAGNOSIS — F321 Major depressive disorder, single episode, moderate: Secondary | ICD-10-CM | POA: Diagnosis not present

## 2017-07-26 DIAGNOSIS — F419 Anxiety disorder, unspecified: Secondary | ICD-10-CM | POA: Diagnosis not present

## 2017-07-26 DIAGNOSIS — R42 Dizziness and giddiness: Secondary | ICD-10-CM | POA: Diagnosis not present

## 2017-07-26 DIAGNOSIS — F410 Panic disorder [episodic paroxysmal anxiety] without agoraphobia: Secondary | ICD-10-CM

## 2017-07-26 DIAGNOSIS — E1165 Type 2 diabetes mellitus with hyperglycemia: Secondary | ICD-10-CM

## 2017-07-26 DIAGNOSIS — I2510 Atherosclerotic heart disease of native coronary artery without angina pectoris: Secondary | ICD-10-CM

## 2017-07-26 MED ORDER — SERTRALINE HCL 100 MG PO TABS: ORAL_TABLET | ORAL | 0 refills | Status: DC

## 2017-07-26 MED ORDER — OXYCODONE-ACETAMINOPHEN 5-325 MG PO TABS: 1.0000 | ORAL_TABLET | Freq: Four times a day (QID) | ORAL | 0 refills | Status: DC | PRN

## 2017-07-26 NOTE — Progress Notes (Signed)
Patient: Vincent Hall Male    DOB: 1965/12/12   52 y.o.   MRN: [id number] Visit Date: 07/26/2017  Today's Provider: [name], MD   Chief Complaint  Patient presents with  . Dizziness   Subjective:    Patient states he has been having episodes of dizziness and a tingling sensation in his head, intermittently. Patient he has also been having shortness of breath. Patient states he has been checking his blood sugar, 246. Blood pressures running 147/85. Patient stated he was so dizzy yesterday afternoon, he finally had to lay down for the rest of the evening.    He had Holter ordered by his cardiologist at [facility] in February which was remarkable only for one short run of VT, a few short runs of SVT and rare SVE and VE.  He states his sugars have been running consistently in the 200s and has been taking up to 17 units humalog as needed. He is still taking Invokamet and glipizide consistently.   He states his mood has been getting more labile. Usually feels depressed and gets upset and angry very easily. He doesn't know why he is feeling this way. Denies any new situations that should make him feels stressed or depressed. He states his wife suggested he see a psychologist. He is taking sertraline consistently every day.     Allergies  Allergen Reactions  . Singulair [Montelukast Sodium] Hives     Current Outpatient Medications:  .  albuterol (PROVENTIL HFA;VENTOLIN HFA) 108 (90 Base) MCG/ACT inhaler, Inhale 2 puffs into the lungs every 6 (six) hours as needed for wheezing or shortness of breath., Disp: 1 Inhaler, Rfl: 3 .  ALPRAZolam (XANAX) 0.5 MG tablet, TAKE 1 TABLET BY MOUTH TWICE DAILY AS NEEDED FOR ANXIETY, Disp: 60 tablet, Rfl: 5 .  aspirin 81 MG tablet, Take 81 mg by mouth daily., Disp: , Rfl:  .  azelastine (ASTELIN) 0.1 % nasal spray, Place 2 sprays into both nostrils 2 (two) times daily., Disp: 30 mL, Rfl: 6 .  azelastine (OPTIVAR) 0.05 % ophthalmic solution, drop  affect eye twice a day, Disp: 6 mL, Rfl: 3 .  Blood Glucose Monitoring Suppl (ONE TOUCH ULTRA SYSTEM KIT) w/Device KIT, 1 kit by Does not apply route once., Disp: 1 each, Rfl: 0 .  BREO ELLIPTA 200-25 MCG/INH AEPB, Take 1 puff by mouth daily., Disp: , Rfl: 6 .  dexlansoprazole (DEXILANT) 60 MG capsule, Take 1 capsule (60 mg total) by mouth daily., Disp: 30 capsule, Rfl: 12 .  glipiZIDE (GLUCOTROL XL) 5 MG 24 hr tablet, TAKE 1 TABLET(5 MG) BY MOUTH DAILY WITH BREAKFAST, Disp: 30 tablet, Rfl: 5 .  glucose blood (ONE TOUCH ULTRA TEST) test strip, Check blood sugar 3 times daily E11.65 (uncontrolled insulin dependent diabetes), Disp: 100 each, Rfl: 12 .  INVOKAMET 150-500 MG TABS, TAKE 1 TABLET BY MOUTH TWICE DAILY, Disp: 60 tablet, Rfl: 5 .  nitroGLYCERIN (NITROSTAT) 0.4 MG SL tablet, Place under the tongue., Disp: , Rfl:  .  oxyCODONE-acetaminophen (PERCOCET) 7.5-325 MG tablet, Take 1 tablet by mouth every 6 (six) hours as needed for severe pain., Disp: 100 tablet, Rfl: 0 .  Probiotic Product (SOLUBLE FIBER/PROBIOTICS PO), Take by mouth once., Disp: , Rfl:  .  ranitidine (ZANTAC) 150 MG capsule, Take 150 mg by mouth as needed for heartburn., Disp: , Rfl:  .  rosuvastatin (CRESTOR) 40 MG tablet, TAKE 1 TABLET(40 MG) BY MOUTH DAILY, Disp: 30 tablet, Rfl: 11 .  sertraline (ZOLOFT) 100 MG tablet, TAKE 1 TABLET(100 MG) BY MOUTH DAILY, Disp: 30 tablet, Rfl: 11 .  zolpidem (AMBIEN) 5 MG tablet, TAKE 1 TABLET BY MOUTH AT BEDTIME AS NEEDED FOR SLEEP, Disp: 20 tablet, Rfl: 5  Review of Systems  Constitutional: Negative for appetite change, chills and fever.  Respiratory: Negative for chest tightness, shortness of breath and wheezing.   Cardiovascular: Negative for chest pain and palpitations.  Gastrointestinal: Negative for abdominal pain, nausea and vomiting.  Neurological: Positive for dizziness.    Social History   Tobacco Use  . Smoking status: Former Smoker    Packs/day: 0.25    Years: 30.00     Pack years: 7.50    Types: Cigarettes    Last attempt to quit: 08/08/2013    Years since quitting: 3.9  . Smokeless tobacco: Never Used  Substance Use Topics  . Alcohol use: No   Objective:   BP 120/68 (BP Location: Right Arm, Patient Position: Sitting, Cuff Size: Large)   Pulse 66   Temp 97.9 F (36.6 C) (Oral)   Resp 16   Wt 200 lb (90.7 kg)   SpO2 98%   BMI 30.41 kg/m  Vitals:   07/26/17 1045  BP: 120/68  Pulse: 66  Resp: 16  Temp: 97.9 F (36.6 C)  TempSrc: Oral  SpO2: 98%  Weight: 200 lb (90.7 kg)     Physical Exam   General Appearance:    Alert, cooperative, depressed affect, tearing up off and on.   Eyes:    PERRL, conjunctiva/corneas clear, EOM's intact       Lungs:     Clear to auscultation bilaterally, respirations unlabored  Heart:    Regular rate and rhythm  Neurologic:   Awake, alert, oriented x 3. No apparent focal neurological           defect.           Assessment & Plan:     1. Panic attacks Titrated sertraline as follows.  - sertraline (ZOLOFT) 100 MG tablet; Take 1 1/2 tablets daily for the next 6 days, then increase 2 tablet daily  Dispense: 1 tablet; Refill: 0 There is prescription for 30 tablets ready to pick up at pharmacy. Will need new prescription sent in about a week at higher dose.   2. Anxiety increase - sertraline (ZOLOFT) 100 MG tablet; Take 1 1/2 tablets daily for the next 6 days, then increase 2 tablet daily  Dispense: 1 tablet; Refill: 0  3. Dizziness Recent 48 Holter was relative normal. Check labs. Likely secondary to anxiety.  - CBC - Comprehensive metabolic panel - Hemoglobin A1c  4. Coronary artery disease involving native coronary artery of native heart without angina pectoris  - Lipid Profile  5. Current moderate episode of major depressive disorder without prior episode [facility])  - Ambulatory referral to Psychiatry  6. Diabetes mellitis Compliant with current medications. Anticipated increasing glipizide if a1c  is still elevated.        [name], MD  [facility]

## 2017-07-26 NOTE — Patient Instructions (Signed)
   Increase sertraline to 1 1/2 tablets every day for 6 days, then increase to 2 tablets daily

## 2017-07-27 LAB — COMPREHENSIVE METABOLIC PANEL
ALT: 30 IU/L (ref 0–44)
AST: 23 IU/L (ref 0–40)
Albumin/Globulin Ratio: 1.9 (ref 1.2–2.2)
Albumin: 4.5 g/dL (ref 3.5–5.5)
Alkaline Phosphatase: 56 IU/L (ref 39–117)
BUN/Creatinine Ratio: 17 (ref 9–20)
BUN: 14 mg/dL (ref 6–24)
Bilirubin Total: 0.3 mg/dL (ref 0.0–1.2)
CO2: 24 mmol/L (ref 20–29)
Calcium: 9.8 mg/dL (ref 8.7–10.2)
Chloride: 96 mmol/L (ref 96–106)
Creatinine, Ser: 0.83 mg/dL (ref 0.76–1.27)
GFR calc Af Amer: 116 mL/min/{1.73_m2} (ref >59)
GFR calc non Af Amer: 100 mL/min/{1.73_m2} (ref >59)
Globulin, Total: 2.4 g/dL (ref 1.5–4.5)
Glucose: 204 mg/dL — ABNORMAL HIGH (ref 65–99)
Potassium: 4.4 mmol/L (ref 3.5–5.2)
Sodium: 138 mmol/L (ref 134–144)
Total Protein: 6.9 g/dL (ref 6.0–8.5)

## 2017-07-27 LAB — LIPID PANEL
Chol/HDL Ratio: 3.7 ratio (ref 0.0–5.0)
Cholesterol, Total: 167 mg/dL (ref 100–199)
HDL: 45 mg/dL (ref >39)
LDL Calculated: 79 mg/dL (ref 0–99)
Triglycerides: 216 mg/dL — ABNORMAL HIGH (ref 0–149)
VLDL Cholesterol Cal: 43 mg/dL — ABNORMAL HIGH (ref 5–40)

## 2017-07-27 LAB — HEMOGLOBIN A1C
Est. average glucose Bld gHb Est-mCnc: 246 mg/dL
Hgb A1c MFr Bld: 10.2 % — ABNORMAL HIGH (ref 4.8–5.6)

## 2017-07-27 LAB — CBC
Hematocrit: 45.2 % (ref 37.5–51.0)
Hemoglobin: 14.9 g/dL (ref 13.0–17.7)
MCH: 27.4 pg (ref 26.6–33.0)
MCHC: 33 g/dL (ref 31.5–35.7)
MCV: 83 fL (ref 79–97)
Platelets: 221 10*3/uL (ref 150–379)
RBC: 5.43 x10E6/uL (ref 4.14–5.80)
RDW: 14.1 % (ref 12.3–15.4)
WBC: 6.6 10*3/uL (ref 3.4–10.8)

## 2017-07-30 ENCOUNTER — Telehealth: Payer: Self-pay

## 2017-07-30 DIAGNOSIS — E1165 Type 2 diabetes mellitus with hyperglycemia: Secondary | ICD-10-CM

## 2017-07-30 MED ORDER — GLIPIZIDE ER 10 MG PO TB24: 10.0000 mg | ORAL_TABLET | Freq: Every day | ORAL | 3 refills | Status: DC

## 2017-07-30 NOTE — Telephone Encounter (Signed)
Patient advised and agrees with treatment plan. Prescription sent into the pharmacy.  

## 2017-07-30 NOTE — Telephone Encounter (Signed)
-----   Message from Birdie Sons, MD sent at 07/27/2017  8:37 AM EDT ----- a1c is 10.2, otherwise labs are good, need to increase glipizide ER to 10mg  once a day, #30, rf x 3. Follow up in May as scheduled.

## 2017-08-01 ENCOUNTER — Telehealth: Payer: Self-pay | Admitting: Family Medicine

## 2017-08-01 DIAGNOSIS — F410 Panic disorder [episodic paroxysmal anxiety] without agoraphobia: Secondary | ICD-10-CM

## 2017-08-01 DIAGNOSIS — F419 Anxiety disorder, unspecified: Secondary | ICD-10-CM

## 2017-08-01 MED ORDER — SERTRALINE HCL 100 MG PO TABS: 200.0000 mg | ORAL_TABLET | Freq: Every day | ORAL | 3 refills | Status: DC

## 2017-08-01 NOTE — Telephone Encounter (Signed)
Refill sertraline at 2 x 100mg  daily

## 2017-08-24 ENCOUNTER — Other Ambulatory Visit: Payer: Self-pay | Admitting: Family Medicine

## 2017-08-24 MED ORDER — OXYCODONE-ACETAMINOPHEN 5-325 MG PO TABS: 1.0000 | ORAL_TABLET | Freq: Four times a day (QID) | ORAL | 0 refills | Status: DC | PRN

## 2017-08-24 NOTE — Telephone Encounter (Signed)
Needs refill on his oxycodone 5/325  He uses [facility]

## 2017-09-04 ENCOUNTER — Ambulatory Visit (INDEPENDENT_AMBULATORY_CARE_PROVIDER_SITE_OTHER): Payer: BLUE CROSS/BLUE SHIELD | Admitting: Family Medicine

## 2017-09-04 ENCOUNTER — Encounter: Payer: Self-pay | Admitting: Family Medicine

## 2017-09-04 VITALS — BP 104/64 | HR 72 | Temp 98.0°F | Resp 16 | Ht 68.0 in | Wt 201.0 lb

## 2017-09-04 DIAGNOSIS — R0789 Other chest pain: Secondary | ICD-10-CM | POA: Diagnosis not present

## 2017-09-04 DIAGNOSIS — F419 Anxiety disorder, unspecified: Secondary | ICD-10-CM

## 2017-09-04 DIAGNOSIS — F410 Panic disorder [episodic paroxysmal anxiety] without agoraphobia: Secondary | ICD-10-CM | POA: Diagnosis not present

## 2017-09-04 DIAGNOSIS — E1165 Type 2 diabetes mellitus with hyperglycemia: Secondary | ICD-10-CM

## 2017-09-04 DIAGNOSIS — F321 Major depressive disorder, single episode, moderate: Secondary | ICD-10-CM

## 2017-09-04 LAB — POCT GLYCOSYLATED HEMOGLOBIN (HGB A1C)
Est. average glucose Bld gHb Est-mCnc: 232
Hemoglobin A1C: 9.7 % — AB (ref 4.0–5.6)

## 2017-09-04 MED ORDER — OXYCODONE-ACETAMINOPHEN 5-325 MG PO TABS: 1.0000 | ORAL_TABLET | Freq: Four times a day (QID) | ORAL | 0 refills | Status: DC | PRN

## 2017-09-04 MED ORDER — PIOGLITAZONE HCL 15 MG PO TABS: 15.0000 mg | ORAL_TABLET | Freq: Every day | ORAL | 4 refills | Status: DC

## 2017-09-04 NOTE — Progress Notes (Signed)
Patient: Vincent Hall Male    DOB: 08/26/1965   52 y.o.   MRN: [id number] Visit Date: 09/04/2017  Today's Provider: [name], MD   Chief Complaint  Patient presents with  . Follow-up  . Diabetes  . Anxiety   Subjective:    HPI   Diabetes Mellitus Type II, Follow-up:   Lab Results  Component Value Date   HGBA1C 9.7 (A) 09/04/2017   HGBA1C 10.2 (H) 07/26/2017   HGBA1C 9.9 05/04/2017   Last seen for diabetes 6 weeks ago.  Management since then includes; labs checked. Increased glipizide ER to 10 mg qd. He reports good compliance with treatment. He is not having side effects. none Current symptoms include none and have been unchanged. Home blood sugar records: fasting range: >200  Episodes of hypoglycemia? no   Current Insulin Regimen: Humalog   Most Recent Eye Exam: 06/06/2016 Weight trend: stable Prior visit with dietician: no Current diet: well balanced Current exercise: none  -----------------------------------------------------------------  Panic attacks/Anxiety From 07/26/2017-increased sertraline to 266m daily and states depression is considerably better, but still having a lot of anxiety. States he takes alprazolam 1-2 every evening and as needed for anxiety attacks which is very effective. He states he does have appointment with psychiatrist tomorrow.    Allergies  Allergen Reactions  . Singulair [Montelukast Sodium] Hives     Current Outpatient Medications:  .  albuterol (PROVENTIL HFA;VENTOLIN HFA) 108 (90 Base) MCG/ACT inhaler, Inhale 2 puffs into the lungs every 6 (six) hours as needed for wheezing or shortness of breath., Disp: 1 Inhaler, Rfl: 3 .  ALPRAZolam (XANAX) 0.5 MG tablet, TAKE 1 TABLET BY MOUTH TWICE DAILY AS NEEDED FOR ANXIETY, Disp: 60 tablet, Rfl: 5 .  aspirin 81 MG tablet, Take 81 mg by mouth daily., Disp: , Rfl:  .  azelastine (ASTELIN) 0.1 % nasal spray, Place 2 sprays into both nostrils 2 (two) times daily., Disp: 30  mL, Rfl: 6 .  azelastine (OPTIVAR) 0.05 % ophthalmic solution, drop affect eye twice a day, Disp: 6 mL, Rfl: 3 .  Blood Glucose Monitoring Suppl (ONE TOUCH ULTRA SYSTEM KIT) w/Device KIT, 1 kit by Does not apply route once., Disp: 1 each, Rfl: 0 .  BREO ELLIPTA 200-25 MCG/INH AEPB, Take 1 puff by mouth daily., Disp: , Rfl: 6 .  dexlansoprazole (DEXILANT) 60 MG capsule, Take 1 capsule (60 mg total) by mouth daily., Disp: 30 capsule, Rfl: 12 .  glipiZIDE (GLUCOTROL XL) 10 MG 24 hr tablet, Take 1 tablet (10 mg total) by mouth daily., Disp: 30 tablet, Rfl: 3 .  glucose blood (ONE TOUCH ULTRA TEST) test strip, Check blood sugar 3 times daily E11.65 (uncontrolled insulin dependent diabetes), Disp: 100 each, Rfl: 12 .  insulin lispro (HUMALOG KWIKPEN) 100 UNIT/ML KiwkPen, Inject into the skin., Disp: , Rfl:  .  INVOKAMET 150-500 MG TABS, TAKE 1 TABLET BY MOUTH TWICE DAILY, Disp: 60 tablet, Rfl: 5 .  nitroGLYCERIN (NITROSTAT) 0.4 MG SL tablet, Place under the tongue., Disp: , Rfl:  .  oxyCODONE-acetaminophen (PERCOCET/ROXICET) 5-325 MG tablet, Take 1 tablet by mouth every 6 (six) hours as needed for severe pain., Disp: 110 tablet, Rfl: 0 .  Probiotic Product (SOLUBLE FIBER/PROBIOTICS PO), Take by mouth once., Disp: , Rfl:  .  ranitidine (ZANTAC) 150 MG capsule, Take 150 mg by mouth as needed for heartburn., Disp: , Rfl:  .  rosuvastatin (CRESTOR) 40 MG tablet, TAKE 1 TABLET(40 MG) BY MOUTH DAILY, Disp: 30  tablet, Rfl: 11 .  sertraline (ZOLOFT) 100 MG tablet, Take 2 tablets (200 mg total) by mouth daily., Disp: 60 tablet, Rfl: 3 .  zolpidem (AMBIEN) 5 MG tablet, TAKE 1 TABLET BY MOUTH AT BEDTIME AS NEEDED FOR SLEEP, Disp: 20 tablet, Rfl: 5  Review of Systems  Constitutional: Negative for appetite change, chills and fever.  Respiratory: Negative for chest tightness, shortness of breath and wheezing.   Cardiovascular: Negative for chest pain and palpitations.  Gastrointestinal: Negative for abdominal pain,  nausea and vomiting.    Social History   Tobacco Use  . Smoking status: Former Smoker    Packs/day: 0.25    Years: 30.00    Pack years: 7.50    Types: Cigarettes    Last attempt to quit: 08/08/2013    Years since quitting: 4.0  . Smokeless tobacco: Never Used  Substance Use Topics  . Alcohol use: No   Objective:   BP 104/64 (BP Location: Right Arm, Patient Position: Sitting, Cuff Size: Large)   Pulse 72   Temp 98 F (36.7 C) (Oral)   Resp 16   Ht '5\' 8"'  (1.727 m)   Wt 201 lb (91.2 kg)   SpO2 94%   BMI 30.56 kg/m  Vitals:   09/04/17 1526  BP: 104/64  Pulse: 72  Resp: 16  Temp: 98 F (36.7 C)  TempSrc: Oral  SpO2: 94%  Weight: 201 lb (91.2 kg)  Height: '5\' 8"'  (1.727 m)     Physical Exam  General appearance: alert, well developed, well nourished, cooperative and in no distress Head: Normocephalic, without obvious abnormality, atraumatic Respiratory: Respirations even and unlabored, normal respiratory rate Extremities: No gross deformities Skin: Skin color, texture, turgor normal. No rashes seen  Psych: Appropriate mood and affect. Neurologic: Mental status: Alert, oriented to person, place, and time, thought content appropriate.  Results for orders placed or performed in visit on 09/04/17  POCT glycosylated hemoglobin (Hb A1C)  Result Value Ref Range   Hemoglobin A1C 9.7 (A) 4.0 - 5.6 %   Est. average glucose Bld gHb Est-mCnc 232        Assessment & Plan:     1. Uncontrolled type 2 diabetes mellitus with hyperglycemia (HCC) Minimal improvement - POCT glycosylated hemoglobin (Hb A1C) - pioglitazone (ACTOS) 15 MG tablet; Take 1 tablet (15 mg total) by mouth daily.  Dispense: 30 tablet; Refill: 4  2. Chest wall pain States that 7.69m tablets are too expensive, but that 529mare not very effective. Advised he can take 1 1/2 tablet no more than every six hours.  - oxyCODONE-acetaminophen (PERCOCET/ROXICET) 5-325 MG tablet; Take 1-2 tablets by mouth every 6  (six) hours as needed for severe pain.  Dispense: 150 tablet; Refill: 0  3. Current moderate episode of major depressive disorder without prior episode ([place] better with increased dose of sertraline.   4. Panic attacks Fairly well controlled on SSRI with prn alprazolam.   5. Anxiety Not much better even on higher dose of sertraline. He does have follow up scheduled with psychiatry tomorrow and will discuss further with them.        DoLelon HuhMD  [facility]

## 2017-09-05 ENCOUNTER — Encounter: Payer: Self-pay | Admitting: Psychiatry

## 2017-09-05 ENCOUNTER — Other Ambulatory Visit: Payer: Self-pay

## 2017-09-05 ENCOUNTER — Ambulatory Visit (INDEPENDENT_AMBULATORY_CARE_PROVIDER_SITE_OTHER): Payer: BLUE CROSS/BLUE SHIELD | Admitting: Psychiatry

## 2017-09-05 VITALS — BP 142/85 | HR 76 | Temp 98.3°F | Wt 200.0 lb

## 2017-09-05 DIAGNOSIS — F331 Major depressive disorder, recurrent, moderate: Secondary | ICD-10-CM

## 2017-09-05 DIAGNOSIS — F5105 Insomnia due to other mental disorder: Secondary | ICD-10-CM

## 2017-09-05 DIAGNOSIS — F410 Panic disorder [episodic paroxysmal anxiety] without agoraphobia: Secondary | ICD-10-CM

## 2017-09-05 DIAGNOSIS — F411 Generalized anxiety disorder: Secondary | ICD-10-CM

## 2017-09-05 MED ORDER — TRAZODONE HCL 100 MG PO TABS: 50.0000 mg | ORAL_TABLET | Freq: Every evening | ORAL | 1 refills | Status: DC | PRN

## 2017-09-05 MED ORDER — DIVALPROEX SODIUM 250 MG PO DR TAB: 250.0000 mg | DELAYED_RELEASE_TABLET | Freq: Three times a day (TID) | ORAL | 1 refills | Status: DC

## 2017-09-05 NOTE — Progress Notes (Signed)
Psychiatric Initial Adult Assessment   Patient Identification: Vincent Hall MRN:  [id number] Date of Evaluation:  09/05/2017 Referral Source: [name] MD Chief Complaint:  ' I am anxious.' Chief Complaint    Establish Care; Anxiety; Agitation; Panic Attack     Visit Diagnosis:    ICD-10-CM   1. MDD (major depressive disorder), recurrent episode, moderate (HCC) F33.1   2. GAD (generalized anxiety disorder) F41.1   3. Insomnia due to mental condition F51.05   4. Panic attacks F41.0     History of Present Illness:  Vincent Hall is a 52 y old hispanic male , [place] in [place] , employed as a Pharmacist, hospital, married, has a history of depression, anxiety, sleep problems, coronary artery disease, status post bypass graft, hypertension, hyperlipidemia, diabetes, presented to the clinic today to establish care.  Patient reports he has been struggling with depression since the past 4 years.  Patient reports his depressive symptoms started after he had his triple bypass surgery in 2015.  Patient reports his symptoms as sadness, feeling depressed, feeling tired, feeling bad for himself, trouble concentrating and so on.  Patient also reports that he sometimes struggle with some suicidal thoughts however he always copes with it by thinking about his wife, his parents, his grandparents as well as his children.  He reports he will never act on anything like that.  Patient also reports having sexual dysfunction after his cardiac surgery.  He reports he is unable to have a sexual relationship with his wife which kind of affect him emotionally.  He reports he had a discussion with his cardiologist about it and they recommended medications like Cialis.  Patient reports he does not want to take those kind of medications.  Patient also reports anxiety symptoms.  He reports he worries about everything to the extreme.  He reports feeling nervous, unable to stop worrying, trouble relaxing, being restless, being  easily irritable and annoyed and so on.  He reports he has been struggling with anxiety since the past 3-4 years.  He also reports panic symptoms.  He reports certain events makes him going to a panic mode.  He reports he witnessed his grandparents go through medical issues.  He reports one of them had cancer and he saw them suffer.  He reports they had severe problems around the neck.  He reports he also went through several investigative procedures because he had several gastric issues.  He also underwent dental procedures.  He reports that whenever he has to undergo anything around his mouth or his neck he goes into a panic mode.  He reports it may be because of what he witnessed happen to his grandparents who struggled with cancer.  Because their problems where around their neck.  He however denies any intrusive memories, flashbacks or nightmares about that.  He however has panic attacks often when he has racing heart rate, chest pain, shortness of breath and has this feeling of impending doom.  He reports he takes Xanax 0.5 mg prescribed by Dr. Caryn Section for his panic symptoms.  He reports he takes up to 2 a day and has been on it since the past several years.  Patient reports sleep as restless.  He reports he has tried Ambien however it did not help.  He reports he is unable to fall asleep and also maintain sleep.  He reports mood lability, irritability and anger issues.  He reports there are times when he gets so irritable and angry that he wants  to hurt the person who provoked it.  He reports he obsesses sometimes about hurting that person.  However he reports he is always to cope with the stress by calling Jesus because that is what his grandmother taught him to do.  He denies getting into trouble at work or at home.  He denies any legal issues.  He denies any history of violence.  He reports he has a good relationship with his wife with whom he has been married since the past 30 years or so.  He  denies any perceptual disturbances.  He denies abusing any drugs or alcohol.  Associated Signs/Symptoms: Depression Symptoms:  depressed mood, insomnia, psychomotor agitation, psychomotor retardation, feelings of worthlessness/guilt, difficulty concentrating, anxiety, panic attacks, (Hypo) Manic Symptoms:  Distractibility, Impulsivity, Irritable Mood, Labiality of Mood, Anxiety Symptoms:  Excessive Worry, Psychotic Symptoms:  denies PTSD Symptoms: Had a traumatic exposure:  as noted above  Past Psychiatric History:Pt reports a history of depression and anxiety.  Patient was being treated by his primary medical doctor.  Patient reports a history of suicide attempts in the past.  Patient reports this happened when he was 52 years old and was going through some struggles.  Patient reports he had attempted suicide twice during that time.  Previous Psychotropic Medications: Yes  Zoloft,xanax,ambien  Substance Abuse History in the last 12 months:  No.  Consequences of Substance Abuse: Negative  Past Medical History:  Past Medical History:  Diagnosis Date  . Asthma   . Coronary artery disease   . Fatty liver   . History of MI (myocardial infarction)     Past Surgical History:  Procedure Laterality Date  . APPENDECTOMY    . CARDIAC CATHETERIZATION  06/24/2013  . COLONOSCOPY WITH PROPOFOL N/A 09/23/2014   Procedure: COLONOSCOPY WITH PROPOFOL;  Surgeon: Lucilla Lame, MD;  Location: [facility];  Service: Endoscopy;  Laterality: N/A;  . CORONARY ANGIOPLASTY     stent placement to LAD  . CORONARY ARTERY BYPASS GRAFT  07/31/2013   x 3 vessels  . FINGER SURGERY    . NASAL SINUS SURGERY    . POLYPECTOMY  09/23/2014   Procedure: POLYPECTOMY;  Surgeon: Lucilla Lame, MD;  Location: [facility];  Service: Endoscopy;;    Family Psychiatric History: Denies  Family History:  Family History  Problem Relation Age of Onset  . Hyperlipidemia Mother   . Heart attack  Father   . Hyperlipidemia Father   . Diabetes Father   . Heart disease Father     Social History:   Social History   Socioeconomic History  . Marital status: Married    Spouse name: clementin  . Number of children: 2  . Years of education: HS Grad  . Highest education level: Associate degree: occupational, Hotel manager, or vocational program  Occupational History  . Occupation: [employer]  Social Needs  . Financial resource strain: Somewhat hard  . Food insecurity:    Worry: Never true    Inability: Never true  . Transportation needs:    Medical: No    Non-medical: No  Tobacco Use  . Smoking status: Former Smoker    Packs/day: 0.25    Years: 30.00    Pack years: 7.50    Types: Cigarettes    Last attempt to quit: 08/08/2013    Years since quitting: 4.0  . Smokeless tobacco: Never Used  Substance and Sexual Activity  . Alcohol use: No  . Drug use: No  . Sexual activity: Yes  Lifestyle  .  Physical activity:    Days per week: 5 days    Minutes per session: 60 min  . Stress: Very much  Relationships  . Social connections:    Talks on phone: Not on file    Gets together: Not on file    Attends religious service: Never    Active member of club or organization: No    Attends meetings of clubs or organizations: Never    Relationship status: Married  Other Topics Concern  . Not on file  Social History Narrative  . Not on file    Additional Social History: Pt is married . Pt lives at [facility] with wife. 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  Patient has been married since the past 30 years.  Patient has 2 adult children a daughter who is 6 years old and a son who is 61 years old.  His daughter works as a Marine scientist and his son works as a Quarry manager.  Patient reports he has a good relationship with his wife and his children.  Allergies:   Allergies  Allergen Reactions  . Montelukast Hives    Singular  . Singulair [Montelukast Sodium] Hives  . Atorvastatin Other  (See Comments)    Muscle pain, neck stiffness    Metabolic Disorder Labs: Lab Results  Component Value Date   HGBA1C 9.7 (A) 09/04/2017   No results found for: PROLACTIN Lab Results  Component Value Date   CHOL 167 07/26/2017   TRIG 216 (H) 07/26/2017   HDL 45 07/26/2017   CHOLHDL 3.7 07/26/2017   LDLCALC 79 07/26/2017   LDLCALC 124 (H) 01/31/2017     Current Medications: Current Outpatient Medications  Medication Sig Dispense Refill  . albuterol (PROVENTIL HFA;VENTOLIN HFA) 108 (90 Base) MCG/ACT inhaler Inhale 2 puffs into the lungs every 6 (six) hours as needed for wheezing or shortness of breath. 1 Inhaler 3  . ALPRAZolam (XANAX) 0.5 MG tablet TAKE 1 TABLET BY MOUTH TWICE DAILY AS NEEDED FOR ANXIETY 60 tablet 5  . aspirin 81 MG tablet Take 81 mg by mouth daily.    Marland Kitchen azelastine (ASTELIN) 0.1 % nasal spray Place 2 sprays into both nostrils 2 (two) times daily. 30 mL 6  . azelastine (OPTIVAR) 0.05 % ophthalmic solution drop affect eye twice a day 6 mL 3  . Blood Glucose Monitoring Suppl (ONE TOUCH ULTRA SYSTEM KIT) w/Device KIT 1 kit by Does not apply route once. 1 each 0  . dexlansoprazole (DEXILANT) 60 MG capsule Take 1 capsule (60 mg total) by mouth daily. 30 capsule 12  . divalproex (DEPAKOTE) 250 MG DR tablet Take 1 tablet (250 mg total) by mouth 3 (three) times daily. 90 tablet 1  . glipiZIDE (GLUCOTROL XL) 10 MG 24 hr tablet Take 1 tablet (10 mg total) by mouth daily. 30 tablet 3  . glucose blood (ONE TOUCH ULTRA TEST) test strip Check blood sugar 3 times daily E11.65 (uncontrolled insulin dependent diabetes) 100 each 12  . insulin lispro (HUMALOG KWIKPEN) 100 UNIT/ML KiwkPen Inject into the skin.    . INVOKAMET 150-500 MG TABS TAKE 1 TABLET BY MOUTH TWICE DAILY 60 tablet 5  . nitroGLYCERIN (NITROSTAT) 0.4 MG SL tablet Place under the tongue.    Marland Kitchen oxyCODONE-acetaminophen (PERCOCET/ROXICET) 5-325 MG tablet Take 1-2 tablets by mouth every 6 (six) hours as needed for severe  pain. 150 tablet 0  . pioglitazone (ACTOS) 15 MG tablet Take 1 tablet (15 mg total) by mouth daily. Arley  tablet 4  . Probiotic Product (SOLUBLE FIBER/PROBIOTICS PO) Take by mouth once.    . ranitidine (ZANTAC) 150 MG capsule Take 150 mg by mouth as needed for heartburn.    . rosuvastatin (CRESTOR) 40 MG tablet TAKE 1 TABLET(40 MG) BY MOUTH DAILY 30 tablet 11  . sertraline (ZOLOFT) 100 MG tablet Take 2 tablets (200 mg total) by mouth daily. 60 tablet 3  . traZODone (DESYREL) 100 MG tablet Take 0.5-1.5 tablets (50-150 mg total) by mouth at bedtime as needed for sleep. 45 tablet 1   No current facility-administered medications for this visit.     Neurologic: Headache: No Seizure: No Paresthesias:No  Musculoskeletal: Strength & Muscle Tone: within normal limits Gait & Station: normal Patient leans: N/A  Psychiatric Specialty Exam: Review of Systems  Psychiatric/Behavioral: Positive for depression. The patient is nervous/anxious and has insomnia.   All other systems reviewed and are negative.   Blood pressure (!) 142/85, pulse 76, temperature 98.3 F (36.8 C), temperature source Oral, weight 200 lb (90.7 kg).Body mass index is 30.41 kg/m.  General Appearance: Casual  Eye Contact:  Fair  Speech:  Clear and Coherent  Volume:  Normal  Mood:  Anxious  Affect:  Congruent  Thought Process:  Goal Directed and Descriptions of Associations: Intact  Orientation:  Full (Time, Place, and Person)  Thought Content:  Logical  Suicidal Thoughts:  No  Homicidal Thoughts:  No  Memory:  Immediate;   Fair Recent;   Fair Remote;   Fair  Judgement:  Fair  Insight:  Fair  Psychomotor Activity:  Normal  Concentration:  Concentration: Fair and Attention Span: Fair  Recall:  [employer] of Knowledge:Fair  Language: Fair  Akathisia:  No  Handed:  Right  AIMS (if indicated):  na  Assets:  Communication Skills Desire for Improvement Housing Social Support  ADL's:  Intact  Cognition: WNL  Sleep:   poor    Treatment Plan Summary:Trayvion is a 52 yr old Hispanic male who has a history of depression, anxiety, coronary artery disease status post bypass graft of heart, hypertension, hyperlipidemia, diabetes mellitus, presented to the clinic today to establish care.  Patient struggles with depressive symptoms as well as generalized anxiety and panic attacks.  Patient has several psychosocial stressors given his history of multiple medical problems including cardiac surgery.  Patient does struggle with some suicidal and aggressive thoughts however reports he has been able to cope with it and reports his wife and his children has positive factors in his life.  Patient also denies any legal issues and reports he loves his work.  Patient denies any substance abuse problems.  Patient reports he is motivated to start treatment as well as pursue psychotherapy.  Plan as noted below.  Plan MDD Patient is currently on Zoloft, discussed with him to take it as a one-time dose in the morning.  He was taking it as a divided dose.  Continue Zoloft 200 mg p.o. Daily. Add Depakote DR 250 mg po tid. PHQ 9 equals 19  For GAD Continue Zoloft 200 mg p.o. Daily. Discussed with patient to taper of Xanax.  He reports he takes at 0.5 mg twice a day.  Discussed with him to cut down to 0.5 mg several times a week.  Provided him education about risk of being on benzodiazepine therapy.  For panic attacks Continue Xanax as needed for now.  Discussed with him about limiting use. We will refer patient for psychotherapy with Ms. Miguel Dibble.  For  insomnia Provided sleep hygiene techniques.  Provided him handouts. Start trazodone 50-150 mg p.o. nightly as needed.   Will get the following labs-Depakote level in 5 days, CMP, TSH.  I have reviewed his cardiology notes per Dr.Yeung.  Patient filled out a mood disorder questionnaire since he reported several irritability and anger issues.  However it came back negative.   It is likely that his anger issues are more due to his depression, anxiety as well as his sexual dysfunction and so on.  Hence patient will benefit from psychotherapy.   Follow up in clinic in 3 weeks or sooner if needed.  More than 50 % of the time was spent for psychoeducation and supportive psychotherapy and care coordination.  This note was generated in part or whole with voice recognition software. Voice recognition is usually quite accurate but there are transcription errors that can and very often do occur. I apologize for any typographical errors that were not detected and corrected.       Ursula Alert, MD 5/30/20199:54 AM

## 2017-09-05 NOTE — Patient Instructions (Addendum)
Please start taking zoloft as a one time dose in the AM  Please limit use of xanax, you are using it twice a day now, try to use it once a day several days a week.      Trazodone tablets What is this medicine? TRAZODONE (TRAZ oh done) is used to treat depression. This medicine may be used for other purposes; ask your health care provider or pharmacist if you have questions. COMMON BRAND NAME(S): Desyrel What should I tell my health care provider before I take this medicine? They need to know if you have any of these conditions: -attempted suicide or thinking about it -bipolar disorder -bleeding problems -glaucoma -heart disease, or previous heart attack -irregular heart beat -kidney or liver disease -low levels of sodium in the blood -an unusual or allergic reaction to trazodone, other medicines, foods, dyes or preservatives -pregnant or trying to get pregnant -breast-feeding How should I use this medicine? Take this medicine by mouth with a glass of water. Follow the directions on the prescription label. Take this medicine shortly after a meal or a light snack. Take your medicine at regular intervals. Do not take your medicine more often than directed. Do not stop taking this medicine suddenly except upon the advice of your doctor. Stopping this medicine too quickly may cause serious side effects or your condition may worsen. A special MedGuide will be given to you by the pharmacist with each prescription and refill. Be sure to read this information carefully each time. Talk to your pediatrician regarding the use of this medicine in children. Special care may be needed. Overdosage: If you think you have taken too much of this medicine contact a poison control center or emergency room at once. NOTE: This medicine is only for you. Do not share this medicine with others. What if I miss a dose? If you miss a dose, take it as soon as you can. If it is almost time for your next dose, take  only that dose. Do not take double or extra doses. What may interact with this medicine? Do not take this medicine with any of the following medications: -certain medicines for fungal infections like fluconazole, itraconazole, ketoconazole, posaconazole, voriconazole -cisapride -dofetilide -dronedarone -linezolid -MAOIs like Carbex, Eldepryl, Marplan, Nardil, and Parnate -mesoridazine -methylene blue (injected into a vein) -pimozide -saquinavir -thioridazine -ziprasidone This medicine may also interact with the following medications: -alcohol -antiviral medicines for HIV or AIDS -aspirin and aspirin-like medicines -barbiturates like phenobarbital -certain medicines for blood pressure, heart disease, irregular heart beat -certain medicines for depression, anxiety, or psychotic disturbances -certain medicines for migraine headache like almotriptan, eletriptan, frovatriptan, naratriptan, rizatriptan, sumatriptan, zolmitriptan -certain medicines for seizures like carbamazepine and phenytoin -certain medicines for sleep -certain medicines that treat or prevent blood clots like dalteparin, enoxaparin, warfarin -digoxin -fentanyl -lithium -NSAIDS, medicines for pain and inflammation, like ibuprofen or naproxen -other medicines that prolong the QT interval (cause an abnormal heart rhythm) -rasagiline -supplements like St. John's wort, kava kava, valerian -tramadol -tryptophan This list may not describe all possible interactions. Give your health care provider a list of all the medicines, herbs, non-prescription drugs, or dietary supplements you use. Also tell them if you smoke, drink alcohol, or use illegal drugs. Some items may interact with your medicine. What should I watch for while using this medicine? Tell your doctor if your symptoms do not get better or if they get worse. Visit your doctor or health care professional for regular checks on your progress. Because it  may take  several weeks to see the full effects of this medicine, it is important to continue your treatment as prescribed by your doctor. Patients and their families should watch out for new or worsening thoughts of suicide or depression. Also watch out for sudden changes in feelings such as feeling anxious, agitated, panicky, irritable, hostile, aggressive, impulsive, severely restless, overly excited and hyperactive, or not being able to sleep. If this happens, especially at the beginning of treatment or after a change in dose, call your health care professional. Dennis Bast may get drowsy or dizzy. Do not drive, use machinery, or do anything that needs mental alertness until you know how this medicine affects you. Do not stand or sit up quickly, especially if you are an older patient. This reduces the risk of dizzy or fainting spells. Alcohol may interfere with the effect of this medicine. Avoid alcoholic drinks. This medicine may cause dry eyes and blurred vision. If you wear contact lenses you may feel some discomfort. Lubricating drops may help. See your eye doctor if the problem does not go away or is severe. Your mouth may get dry. Chewing sugarless gum, sucking hard candy and drinking plenty of water may help. Contact your doctor if the problem does not go away or is severe. What side effects may I notice from receiving this medicine? Side effects that you should report to your doctor or health care professional as soon as possible: -allergic reactions like skin rash, itching or hives, swelling of the face, lips, or tongue -elevated mood, decreased need for sleep, racing thoughts, impulsive behavior -confusion -fast, irregular heartbeat -feeling faint or lightheaded, falls -feeling agitated, angry, or irritable -loss of balance or coordination -painful or prolonged erections -restlessness, pacing, inability to keep still -suicidal thoughts or other mood changes -tremors -trouble  sleeping -seizures -unusual bleeding or bruising Side effects that usually do not require medical attention (report to your doctor or health care professional if they continue or are bothersome): -change in sex drive or performance -change in appetite or weight -constipation -headache -muscle aches or pains -nausea This list may not describe all possible side effects. Call your doctor for medical advice about side effects. You may report side effects to FDA at 1-800-FDA-1088. Where should I keep my medicine? Keep out of the reach of children. Store at room temperature between 15 and 30 degrees C (59 to 86 degrees F). Protect from light. Keep container tightly closed. Throw away any unused medicine after the expiration date. NOTE: This sheet is a summary. It may not cover all possible information. If you have questions about this medicine, talk to your doctor, pharmacist, or health care provider.  2018 Elsevier/Gold Standard (2015-08-26 16:57:05)  Valproic Acid, Divalproex Sodium delayed or extended-release tablets What is this medicine? DIVALPROEX SODIUM (dye VAL pro ex SO dee um) is used to prevent seizures caused by some forms of epilepsy. It is also used to treat bipolar mania and to prevent migraine headaches. This medicine may be used for other purposes; ask your health care provider or pharmacist if you have questions. COMMON BRAND NAME(S): Depakote, Depakote ER What should I tell my health care provider before I take this medicine? They need to know if you have any of these conditions: -if you often drink alcohol -kidney disease -liver disease -low platelet counts -mitochondrial disease -suicidal thoughts, plans, or attempt; a previous suicide attempt by you or a family member -urea cycle disorder (UCD) -an unusual or allergic reaction to divalproex sodium, sodium  valproate, valproic acid, other medicines, foods, dyes, or preservatives -pregnant or trying to get  pregnant -breast-feeding How should I use this medicine? Take this medicine by mouth with a drink of water. Follow the directions on the prescription label. Do not cut, crush or chew this medicine. You can take it with or without food. If it upsets your stomach, take it with food. Take your medicine at regular intervals. Do not take it more often than directed. Do not stop taking except on your doctor's advice. A special MedGuide will be given to you by the pharmacist with each prescription and refill. Be sure to read this information carefully each time. Talk to your pediatrician regarding the use of this medicine in children. While this drug may be prescribed for children as young as 10 years for selected conditions, precautions do apply. Overdosage: If you think you have taken too much of this medicine contact a poison control center or emergency room at once. NOTE: This medicine is only for you. Do not share this medicine with others. What if I miss a dose? If you miss a dose, take it as soon as you can. If it is almost time for your next dose, take only that dose. Do not take double or extra doses. What may interact with this medicine? Do not take this medicine with any of the following medications: -sodium phenylbutyrate This medicine may also interact with the following medications: -aspirin -certain antibiotics like ertapenem, imipenem, meropenem -certain medicines for depression, anxiety, or psychotic disturbances -certain medicines for seizures like carbamazepine, clonazepam, diazepam, ethosuximide, felbamate, lamotrigine, phenobarbital, phenytoin, primidone, rufinamide, topiramate -certain medicines that treat or prevent blood clots like warfarin -cholestyramine -male hormones, like estrogens and birth control pills, patches, or rings -propofol -rifampin -ritonavir -tolbutamide -zidovudine This list may not describe all possible interactions. Give your health care provider a  list of all the medicines, herbs, non-prescription drugs, or dietary supplements you use. Also tell them if you smoke, drink alcohol, or use illegal drugs. Some items may interact with your medicine. What should I watch for while using this medicine? Tell your doctor or healthcare professional if your symptoms do not get better or they start to get worse. Wear a medical ID bracelet or chain, and carry a card that describes your disease and details of your medicine and dosage times. You may get drowsy, dizzy, or have blurred vision. Do not drive, use machinery, or do anything that needs mental alertness until you know how this medicine affects you. To reduce dizzy or fainting spells, do not sit or stand up quickly, especially if you are an older patient. Alcohol can increase drowsiness and dizziness. Avoid alcoholic drinks. This medicine can make you more sensitive to the sun. Keep out of the sun. If you cannot avoid being in the sun, wear protective clothing and use sunscreen. Do not use sun lamps or tanning beds/booths. Patients and their families should watch out for new or worsening depression or thoughts of suicide. Also watch out for sudden changes in feelings such as feeling anxious, agitated, panicky, irritable, hostile, aggressive, impulsive, severely restless, overly excited and hyperactive, or not being able to sleep. If this happens, especially at the beginning of treatment or after a change in dose, call your health care professional. Women should inform their doctor if they wish to become pregnant or think they might be pregnant. There is a potential for serious side effects to an unborn child. Talk to your health care professional or pharmacist  for more information. Women who become pregnant while using this medicine may enroll in the [place] Pregnancy Registry by calling [phone]. This registry collects information about the safety of antiepileptic drug use  during pregnancy. What side effects may I notice from receiving this medicine? Side effects that you should report to your doctor or health care professional as soon as possible: -allergic reactions like skin rash, itching or hives, swelling of the face, lips, or tongue -changes in vision -redness, blistering, peeling or loosening of the skin, including inside the mouth -signs and symptoms of liver injury like dark yellow or brown urine; general ill feeling or flu-like symptoms; light-colored stools; loss of appetite; nausea; right upper belly pain; unusually weak or tired; yellowing of the eyes or skin -suicidal thoughts or other mood changes -unusual bleeding or bruising Side effects that usually do not require medical attention (report to your doctor or health care professional if they continue or are bothersome): -constipation -diarrhea -dizziness -hair loss -headache -loss of appetite -weight gain This list may not describe all possible side effects. Call your doctor for medical advice about side effects. You may report side effects to FDA at 1-800-FDA-1088. Where should I keep my medicine? Keep out of reach of children. Store at room temperature between 15 and 30 degrees C (59 and 86 degrees F). Keep container tightly closed. Throw away any unused medicine after the expiration date. NOTE: This sheet is a summary. It may not cover all possible information. If you have questions about this medicine, talk to your doctor, pharmacist, or health care provider.  2018 Elsevier/Gold Standard (2015-07-01 07:11:40)

## 2017-09-06 ENCOUNTER — Encounter: Payer: Self-pay | Admitting: Psychiatry

## 2017-09-07 ENCOUNTER — Other Ambulatory Visit: Payer: Self-pay | Admitting: Family Medicine

## 2017-09-20 ENCOUNTER — Encounter: Payer: Self-pay | Admitting: Psychiatry

## 2017-09-20 ENCOUNTER — Ambulatory Visit (INDEPENDENT_AMBULATORY_CARE_PROVIDER_SITE_OTHER): Payer: Self-pay | Admitting: Psychiatry

## 2017-09-20 ENCOUNTER — Other Ambulatory Visit: Payer: Self-pay

## 2017-09-20 VITALS — BP 133/77 | HR 79 | Wt 201.4 lb

## 2017-09-20 DIAGNOSIS — F5105 Insomnia due to other mental disorder: Secondary | ICD-10-CM

## 2017-09-20 DIAGNOSIS — F331 Major depressive disorder, recurrent, moderate: Secondary | ICD-10-CM

## 2017-09-20 DIAGNOSIS — F411 Generalized anxiety disorder: Secondary | ICD-10-CM

## 2017-09-20 MED ORDER — LITHIUM CARBONATE 150 MG PO CAPS: 150.0000 mg | ORAL_CAPSULE | Freq: Every day | ORAL | 0 refills | Status: DC

## 2017-09-20 NOTE — Progress Notes (Signed)
[facility] OP Progress Note  09/20/2017 2:47 PM Vincent Hall  MRN:  [id number]  Chief Complaint: ' I am here for follow up.' Chief Complaint    Follow-up; Medication Problem     HPI: Vincent Hall is a 52 year old Hispanic male, lives in [place], employed as a Pharmacist, hospital, married, has a history of depression, anxiety, sleep problems, coronary artery disease, status post bypass graft, hypertension, hyperlipidemia, diabetes, presented to the clinic today for a follow-up visit.  Patient today presented to the clinic reporting side effects to his Depakote.  Patient reports he started having some blurry vision and unsteady gait after he started the medication.   Continues to have some anxiety symptoms mood lability and irritability.  Patient continues to deal with some anger issues.  He denies any suicidality.  He denies any homicidality.  He denies any perceptual disturbances.  He reports he completely stopped taking his Xanax.  He denies any withdrawal symptoms.  He does report a history of sleep problems however reports trazodone is helpful.  Discussed adding lithium.  Patient agrees with plan.  Provided medication education.  Provided him lab slip to get his lithium level. Visit Diagnosis:    ICD-10-CM   1. MDD (major depressive disorder), recurrent episode, moderate (HCC) F33.1   2. GAD (generalized anxiety disorder) F41.1   3. Insomnia due to mental condition F51.05     Past Psychiatric History: I have reviewed past psychiatric history from my progress note on 09/05/2017.  Past trials of Zoloft, Xanax, Ambien, Depakote.  Past Medical History:  Past Medical History:  Diagnosis Date  . Asthma   . Coronary artery disease   . Fatty liver   . History of MI (myocardial infarction)     Past Surgical History:  Procedure Laterality Date  . APPENDECTOMY    . CARDIAC CATHETERIZATION  06/24/2013  . COLONOSCOPY WITH PROPOFOL N/A 09/23/2014   Procedure: COLONOSCOPY WITH PROPOFOL;  Surgeon: Lucilla Lame, MD;  Location: [facility];  Service: Endoscopy;  Laterality: N/A;  . CORONARY ANGIOPLASTY     stent placement to LAD  . CORONARY ARTERY BYPASS GRAFT  07/31/2013   x 3 vessels  . FINGER SURGERY    . NASAL SINUS SURGERY    . POLYPECTOMY  09/23/2014   Procedure: POLYPECTOMY;  Surgeon: Lucilla Lame, MD;  Location: [facility];  Service: Endoscopy;;    Family Psychiatric History: Denies.  Family History:  Family History  Problem Relation Age of Onset  . Hyperlipidemia Mother   . Heart attack Father   . Hyperlipidemia Father   . Diabetes Father   . Heart disease Father    Substance abuse history: Denies  Social History: Patient is married.  Patient lives at [facility] with wife.  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  Patient has been married since the past 30 years.  Patient has 2 adult children, a daughter who is 59 years old and a son who is 49 years old.  His daughter works as a Marine scientist and his son works as a Quarry manager.  Patient reports he has a good relationship with his wife and his children. Social History   Socioeconomic History  . Marital status: Married    Spouse name: clementin  . Number of children: 2  . Years of education: HS Grad  . Highest education level: Associate degree: occupational, Hotel manager, or vocational program  Occupational History  . Occupation: [employer]  Social Needs  . Financial resource strain: Somewhat  hard  . Food insecurity:    Worry: Never true    Inability: Never true  . Transportation needs:    Medical: No    Non-medical: No  Tobacco Use  . Smoking status: Former Smoker    Packs/day: 0.25    Years: 30.00    Pack years: 7.50    Types: Cigarettes    Last attempt to quit: 08/08/2013    Years since quitting: 4.1  . Smokeless tobacco: Never Used  Substance and Sexual Activity  . Alcohol use: No  . Drug use: No  . Sexual activity: Yes  Lifestyle  . Physical activity:    Days per week: 5 days     Minutes per session: 60 min  . Stress: Very much  Relationships  . Social connections:    Talks on phone: Not on file    Gets together: Not on file    Attends religious service: Never    Active member of club or organization: No    Attends meetings of clubs or organizations: Never    Relationship status: Married  Other Topics Concern  . Not on file  Social History Narrative  . Not on file    Allergies:  Allergies  Allergen Reactions  . Montelukast Hives    Singular  . Singulair [Montelukast Sodium] Hives  . Atorvastatin Other (See Comments)    Muscle pain, neck stiffness    Metabolic Disorder Labs: Lab Results  Component Value Date   HGBA1C 9.7 (A) 09/04/2017   No results found for: PROLACTIN Lab Results  Component Value Date   CHOL 167 07/26/2017   TRIG 216 (H) 07/26/2017   HDL 45 07/26/2017   CHOLHDL 3.7 07/26/2017   LDLCALC 79 07/26/2017   LDLCALC 124 (H) 01/31/2017   Lab Results  Component Value Date   TSH 1.90 01/31/2017   TSH 2.31 07/02/2014    Therapeutic Level Labs: No results found for: LITHIUM No results found for: VALPROATE No components found for:  CBMZ  Current Medications: Current Outpatient Medications  Medication Sig Dispense Refill  . albuterol (PROVENTIL HFA;VENTOLIN HFA) 108 (90 Base) MCG/ACT inhaler Inhale 2 puffs into the lungs every 6 (six) hours as needed for wheezing or shortness of breath. 1 Inhaler 3  . ALPRAZolam (XANAX) 0.5 MG tablet TAKE 1 TABLET BY MOUTH TWICE DAILY AS NEEDED FOR ANXIETY 60 tablet 5  . aspirin 81 MG tablet Take 81 mg by mouth daily.    Marland Kitchen azelastine (ASTELIN) 0.1 % nasal spray Place 2 sprays into both nostrils 2 (two) times daily. 30 mL 6  . azelastine (OPTIVAR) 0.05 % ophthalmic solution drop affect eye twice a day 6 mL 3  . Blood Glucose Monitoring Suppl (ONE TOUCH ULTRA SYSTEM KIT) w/Device KIT 1 kit by Does not apply route once. 1 each 0  . dexlansoprazole (DEXILANT) 60 MG capsule Take 1 capsule (60 mg  total) by mouth daily. 30 capsule 12  . glipiZIDE (GLUCOTROL XL) 10 MG 24 hr tablet Take 1 tablet (10 mg total) by mouth daily. 30 tablet 3  . glucose blood (ONE TOUCH ULTRA TEST) test strip Check blood sugar 3 times daily E11.65 (uncontrolled insulin dependent diabetes) 100 each 12  . insulin lispro (HUMALOG KWIKPEN) 100 UNIT/ML KiwkPen Inject into the skin.    . INVOKAMET 150-500 MG TABS TAKE 1 TABLET BY MOUTH TWICE DAILY 60 tablet 5  . nitroGLYCERIN (NITROSTAT) 0.4 MG SL tablet Place under the tongue.    Marland Kitchen oxyCODONE-acetaminophen (PERCOCET/ROXICET) 5-325 MG  tablet Take 1-2 tablets by mouth every 6 (six) hours as needed for severe pain. 150 tablet 0  . pioglitazone (ACTOS) 15 MG tablet Take 1 tablet (15 mg total) by mouth daily. 30 tablet 4  . Probiotic Product (SOLUBLE FIBER/PROBIOTICS PO) Take by mouth once.    . ranitidine (ZANTAC) 150 MG capsule Take 150 mg by mouth as needed for heartburn.    . rosuvastatin (CRESTOR) 40 MG tablet TAKE 1 TABLET(40 MG) BY MOUTH DAILY 30 tablet 11  . rosuvastatin (CRESTOR) 40 MG tablet TAKE 1 TABLET(40 MG) BY MOUTH DAILY 30 tablet 12  . sertraline (ZOLOFT) 100 MG tablet Take 2 tablets (200 mg total) by mouth daily. 60 tablet 3  . traZODone (DESYREL) 100 MG tablet Take 0.5-1.5 tablets (50-150 mg total) by mouth at bedtime as needed for sleep. 45 tablet 1  . lithium carbonate 150 MG capsule Take 1 capsule (150 mg total) by mouth daily with supper. 15 capsule 0   No current facility-administered medications for this visit.      Musculoskeletal: Strength & Muscle Tone: within normal limits Gait & Station: normal Patient leans: N/A  Psychiatric Specialty Exam: Review of Systems  Psychiatric/Behavioral: The patient is nervous/anxious.   All other systems reviewed and are negative.   Blood pressure 133/77, pulse 79, weight 201 lb 6.4 oz (91.4 kg).Body mass index is 30.62 kg/m.  General Appearance: Casual  Eye Contact:  Fair  Speech:  Clear and Coherent   Volume:  Normal  Mood:  Depressed  Affect:  Appropriate  Thought Process:  Goal Directed and Descriptions of Associations: Intact  Orientation:  Full (Time, Place, and Person)  Thought Content: Logical   Suicidal Thoughts:  No  Homicidal Thoughts:  No  Memory:  Immediate;   Fair Recent;   Fair Remote;   Fair  Judgement:  Fair  Insight:  Fair  Psychomotor Activity:  Normal  Concentration:  Concentration: Fair and Attention Span: Fair  Recall:  [employer] of Knowledge: Fair  Language: Fair  Akathisia:  No  Handed:  Right  AIMS (if indicated): na  Assets:  Communication Skills Desire for Improvement Social Support  ADL's:  Intact  Cognition: WNL  Sleep:  improving   Screenings: PHQ2-9     Office Visit from 09/04/2017 in [facility] Office Visit from 06/13/2016 in [facility]  PHQ-2 Total Score  3  1  PHQ-9 Total Score  17  11       Assessment and Plan: Tyhir is a 52 year old Hispanic male who has a history of depression, anxiety, coronary artery disease status post bypass graft of heart, hypertension, hyperlipidemia, diabetes mellitus, presented to the clinic today to establish care.  Patient struggles with mood lability, anxiety and panic attacks.  Patient does have several psychosocial stresses including multiple medical problems, cardiac surgery.  Patient continues to have good support system from his wife and his children are a positive factor in his life.  He denies any substance abuse problems.  And he loves his work.  He is motivated to stay on treatment as well as pursue psychotherapy.  Plan as noted below.  Plan MDD Continue Zoloft 200 mg p.o. daily. Discontinue Depakote for side effects Start lithium 150 mg p.o. with supper.  Provided him lab slip to get lithium level in 5 days.  Provided him with medication education.   GAD Continue Zoloft 200 mg p.o. daily Patient has stopped taking the Xanax, but denies withdrawal  symptoms.  For  panic attacks Patient was referred for psychotherapy with Ms. Miguel Dibble.  For insomnia Provided sleep hygiene techniques.  Provided him handouts. Continue trazodone 50-150 mg p.o. nightly as needed.  We will get the following labs-lithium, CMP, TSH.  Follow up in clinic in 2 weeks.  More than 50 % of the time was spent for psychoeducation and supportive psychotherapy and care coordination. This note was generated in part or whole with voice recognition software. Voice recognition is usually quite accurate but there are transcription errors that can and very often do occur. I apologize for any typographical errors that were not detected and corrected.            Ursula Alert, MD 09/21/2017, 8:35 AM

## 2017-09-20 NOTE — Patient Instructions (Signed)
Lithium tablets or capsules What is this medicine? LITHIUM (LITH ee um) is used to prevent and treat the manic episodes caused by manic-depressive illness. This medicine may be used for other purposes; ask your health care provider or pharmacist if you have questions. COMMON BRAND NAME(S): Eskalith What should I tell my health care provider before I take this medicine? They need to know if you have any of these conditions: -active infection -Brugada Syndrome -dehydration (diarrhea or sweating) -diet low in salt -heart disease -high levels of calcium in the blood -history of irregular heartbeat -kidney disease -low level of potassium or sodium in the blood -parathyroid disease -problems urinating -thyroid disease -an unusual or allergic reaction to lithium, other medicines, foods, dyes, or preservatives -pregnant or trying to get pregnant -breast-feeding How should I use this medicine? Take this medicine by mouth with a glass of water. Follow the directions on the prescription label. Take after a meal or snack to avoid stomach upset. Take your doses at regular intervals. Do not take your medicine more often than directed. The amount of this medicine you take is very important. Taking more than the prescribed dose can cause serious side effects. Do not stop taking except on the advice of your doctor or health care professional. Talk to your pediatrician regarding the use of this medicine in children. Special care may be needed. While this drug may be prescribed for children as young as 12 years for selected conditions, precautions do apply. Overdosage: If you think you have taken too much of this medicine contact a poison control center or emergency room at once. NOTE: This medicine is only for you. Do not share this medicine with others. What if I miss a dose? If you miss a dose, take it as soon as you can. If it is almost time for your next dose, take only that dose. Do not take double or  extra doses. What may interact with this medicine? Do not take this medicine with any of the following medications: -cisapride -dofetilide -dronedarone -pimozide -stimulant medicines used to treat ADHD or narcolepsy -thioridazine -ziprasidone This medicine may also interact with the following medications: -buspirone -caffeine -calcium iodide -carbamazepine -certain medicines for depression, anxiety, or psychotic disturbances -certain medicines for migraine headache like almotriptan, eletriptan, frovatriptan, naratriptan, rizatriptan, sumatriptan, zolmitriptan -diuretics -fentanyl -linezolid -MAOIs like Carbex, Eldepryl, Marplan, Nardil, and Parnate -medicines for high blood pressure -medicines that relax muscles for surgery -metronidazole -NSAIDs, medicines for pain and inflammation, like ibuprofen or naproxen -other medicines that prolong the QT interval (cause an abnormal heart rhythm) -phenytoin -potassium iodide, KI -sodium bicarbonate -sodium chloride -St. John's Wort -tramadol -tryptophan -urea This list may not describe all possible interactions. Give your health care provider a list of all the medicines, herbs, non-prescription drugs, or dietary supplements you use. Also tell them if you smoke, drink alcohol, or use illegal drugs. Some items may interact with your medicine. What should I watch for while using this medicine? Visit your doctor or health care professional for regular checks on your progress. It can take several weeks of treatment before you start to get better. The amount of salt (sodium) in your body influences the effects of this medicine, and this medicine can increase salt loss from the body. Eat a normal diet that includes salt. Do not change to salt substitutes. Avoid changes involving diet, or medications that include large amounts of sodium like sodium bicarbonate. Ask your doctor or health care professional for advice if you are not   sure. Drink  plenty of fluids while you are taking this medicine. Avoid drinks that contain caffeine, such as coffee, tea and colas. You will need extra fluids if you have diarrhea or sweat a lot. This will help prevent toxic effects from this medicine. Be careful not to get overheated during exercise, saunas, hot baths, and hot weather. Consult your doctor or health care professional if you have a high fever or persistent diarrhea. You may get drowsy or dizzy. Do not drive, use machinery, or do anything that needs mental alertness until you know how this medicine affects you. Do not stand or sit up quickly, especially if you are an older patient. This reduces the risk of dizzy or fainting spells. What side effects may I notice from receiving this medicine? Side effects that you should report to your doctor or health care professional as soon as possible: -allergic reactions like skin rash, itching or hives, swelling of the face, lips, or tongue -blurred vision -breathing problems -clumsiness or loss of balance -confusion -difficulty speaking or swallowing -dizziness -feeling faint or lightheaded, falls -increased thirst -increased urination -loss of appetite -muscle weakness -nausea, vomiting -pain, coldness, or blue coloration of fingers or toes -sensitivity to cold -seizures -slow, fast, or irregular heartbeat (palpitations) -slurred speech -swelling in the neck -unusually weak or tired Side effects that usually do not require medical attention (report to your doctor or health care professional if they continue or are bothersome): -acne -diarrhea -mild tremor -stomach pain -weight gain This list may not describe all possible side effects. Call your doctor for medical advice about side effects. You may report side effects to FDA at 1-800-FDA-1088. Where should I keep my medicine? Keep out of the reach of children. Store at room temperature between 15 and 30 degrees C (59 and 86 degrees F).  Throw away any unused medicine after the expiration date. NOTE: This sheet is a summary. It may not cover all possible information. If you have questions about this medicine, talk to your doctor, pharmacist, or health care provider.  2018 [employer]vier/Gold Standard (2015-03-26 17:31:38)  

## 2017-09-21 ENCOUNTER — Encounter: Payer: Self-pay | Admitting: Psychiatry

## 2017-09-26 ENCOUNTER — Ambulatory Visit: Payer: Self-pay | Admitting: Licensed Clinical Social Worker

## 2017-09-26 ENCOUNTER — Ambulatory Visit: Payer: BLUE CROSS/BLUE SHIELD | Admitting: Psychiatry

## 2017-10-03 ENCOUNTER — Other Ambulatory Visit: Payer: Self-pay

## 2017-10-03 ENCOUNTER — Ambulatory Visit: Payer: BLUE CROSS/BLUE SHIELD | Admitting: Psychiatry

## 2017-10-03 DIAGNOSIS — R0789 Other chest pain: Secondary | ICD-10-CM

## 2017-10-03 MED ORDER — OXYCODONE-ACETAMINOPHEN 5-325 MG PO TABS: 1.0000 | ORAL_TABLET | Freq: Four times a day (QID) | ORAL | 0 refills | Status: DC | PRN

## 2017-10-03 NOTE — Telephone Encounter (Signed)
Patient called requesting refill

## 2017-10-07 ENCOUNTER — Other Ambulatory Visit: Payer: Self-pay | Admitting: Family Medicine

## 2017-11-02 ENCOUNTER — Other Ambulatory Visit: Payer: Self-pay

## 2017-11-02 DIAGNOSIS — R0789 Other chest pain: Secondary | ICD-10-CM

## 2017-11-02 MED ORDER — OXYCODONE-ACETAMINOPHEN 5-325 MG PO TABS: 1.0000 | ORAL_TABLET | Freq: Four times a day (QID) | ORAL | 0 refills | Status: DC | PRN

## 2017-11-02 NOTE — Telephone Encounter (Signed)
Patient called requesting refills. Thanks!  

## 2017-11-11 ENCOUNTER — Other Ambulatory Visit: Payer: Self-pay | Admitting: Psychiatry

## 2017-11-16 ENCOUNTER — Encounter: Payer: Self-pay | Admitting: Family Medicine

## 2017-11-16 ENCOUNTER — Ambulatory Visit (INDEPENDENT_AMBULATORY_CARE_PROVIDER_SITE_OTHER): Payer: Self-pay | Admitting: Family Medicine

## 2017-11-16 VITALS — BP 120/84 | HR 78 | Temp 98.6°F | Resp 16 | Wt 195.8 lb

## 2017-11-16 DIAGNOSIS — L568 Other specified acute skin changes due to ultraviolet radiation: Secondary | ICD-10-CM

## 2017-11-16 DIAGNOSIS — B370 Candidal stomatitis: Secondary | ICD-10-CM

## 2017-11-16 DIAGNOSIS — J069 Acute upper respiratory infection, unspecified: Secondary | ICD-10-CM

## 2017-11-16 MED ORDER — MOMETASONE FUROATE 0.1 % EX CREA: TOPICAL_CREAM | CUTANEOUS | 1 refills | Status: DC

## 2017-11-16 NOTE — Progress Notes (Signed)
  Subjective:     Patient ID: Vincent Hall, male   DOB: 03-28-1966, 52 y.o.   MRN: [id number] Chief Complaint  Patient presents with  . Sore Throat    Patient comes in office today with complaints of sore throat, cough and fever for the past 4 days. Patient states that he was tearing out old carpet in a house when he started to get sick with symptoms. Patient has been taking otc Benadry and Tylenol for relief.    HPI He reports feeling feverish with chills and hoarseness but no significant sinus congestion. He has developed a sore mouth with a white tongue coating. He has dentures. Also wishes refill on mometasone for photodermatitis.  Review of Systems     Objective:   Physical Exam  Constitutional: He appears well-developed and well-nourished. No distress.  Ears: T.M's intact without inflammation Throat: Inner lips, bilateral buccal areas, and posterior pharynx are erythematous with small amounts of white plaque  noted  Ttongue is white coated but does not rub off. Neck: no cervical adenopathy Lungs: clear     Assessment:    1. Thrush, oral: written rx for nystatin/viscous lidocaine mouthwash 200 ml. Swish and spit 5 ml 3-4 x day.  2. Photodermatitis due to sun: rx for mometasone cream 45 gram with RF  3. Viral upper respiratory tract infection    Plan:    Continue sx treatment with Nyquil and Mucinex D for congestion.

## 2017-11-16 NOTE — Patient Instructions (Signed)
Continue Nyquil to help sleep. May use Mucinex D for sinus congestion.

## 2017-11-30 ENCOUNTER — Encounter: Payer: Self-pay | Admitting: Family Medicine

## 2017-11-30 ENCOUNTER — Ambulatory Visit (INDEPENDENT_AMBULATORY_CARE_PROVIDER_SITE_OTHER): Payer: Self-pay | Admitting: Family Medicine

## 2017-11-30 VITALS — BP 113/69 | HR 63 | Temp 98.3°F | Resp 16 | Ht 68.0 in | Wt 200.0 lb

## 2017-11-30 DIAGNOSIS — J069 Acute upper respiratory infection, unspecified: Secondary | ICD-10-CM

## 2017-11-30 DIAGNOSIS — B370 Candidal stomatitis: Secondary | ICD-10-CM

## 2017-11-30 DIAGNOSIS — R0789 Other chest pain: Secondary | ICD-10-CM

## 2017-11-30 DIAGNOSIS — E1165 Type 2 diabetes mellitus with hyperglycemia: Secondary | ICD-10-CM

## 2017-11-30 LAB — POCT GLYCOSYLATED HEMOGLOBIN (HGB A1C)
Est. average glucose Bld gHb Est-mCnc: 192
Hemoglobin A1C: 8.3 % — AB (ref 4.0–5.6)

## 2017-11-30 MED ORDER — AMOXICILLIN 500 MG PO CAPS: 500.0000 mg | ORAL_CAPSULE | Freq: Three times a day (TID) | ORAL | 0 refills | Status: AC

## 2017-11-30 MED ORDER — FLUCONAZOLE 100 MG PO TABS: 100.0000 mg | ORAL_TABLET | Freq: Every day | ORAL | 0 refills | Status: AC

## 2017-11-30 MED ORDER — OXYCODONE-ACETAMINOPHEN 5-325 MG PO TABS: 1.0000 | ORAL_TABLET | Freq: Four times a day (QID) | ORAL | 0 refills | Status: DC | PRN

## 2017-11-30 NOTE — Progress Notes (Signed)
Patient: Vincent Hall Male    DOB: Dec 08, 1965   52 y.o.   MRN: [id number] Visit Date: 11/30/2017  Today's Provider: [name], MD   Chief Complaint  Patient presents with  . Follow-up  . Diabetes   Subjective:    HPI   Diabetes Mellitus Type II, Follow-up:   Lab Results  Component Value Date   HGBA1C 9.7 (A) 09/04/2017   HGBA1C 10.2 (H) 07/26/2017   HGBA1C 9.9 05/04/2017   Last seen for diabetes 3 months ago.  Management since then includes; added Actos 15 mg qd. He reports good compliance with treatment. He is not having side effects. none Current symptoms include none and have been unchanged. Home blood sugar records: fasting range: 100-130  Episodes of hypoglycemia? no   Current Insulin Regimen: n/a Most Recent Eye Exam: 06/06/2016 Weight trend: stable Prior visit with dietician: no Current diet: well balanced  Current exercise: none  ---------------------------------------------------------------   Current moderate episode of major depressive disorder without prior episode ([place]) From 09/04/2017-no changes made at that time as he was doing much better with increased dose of sertraline. He states he continues to do well with medication and will continue.   Panic attacks From 09/04/2017-no changes were made. He had been taking alprazolam prn, and much less frequently since being on sertraline.    Patient has saw Donald Prose for Thrush and URI on 11/16/2017. Was prescribed oral nystatin with lidocaine mouth rinse, but states thrush has  not gotten any better.    He also reports today his medical problems are interfering with his job. 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 He is anticipating having to apply for disability due to increasing difficulty doing his job due these medical conditions.    Allergies  Allergen Reactions  . Montelukast Hives    Singular  . Singulair [Montelukast Sodium] Hives  . Atorvastatin Other (See Comments)    Muscle pain, neck stiffness     Current Outpatient Medications:  .  albuterol (PROVENTIL HFA;VENTOLIN HFA) 108 (90 Base) MCG/ACT inhaler, Inhale 2 puffs into the lungs every 6 (six) hours as needed for wheezing or shortness of breath., Disp: 1 Inhaler, Rfl: 3 .  ALPRAZolam (XANAX) 0.5 MG tablet, TAKE 1 TABLET BY MOUTH TWICE DAILY AS NEEDED FOR ANXIETY, Disp: 60 tablet, Rfl: 5 .  aspirin 81 MG tablet, Take 81 mg by mouth daily., Disp: , Rfl:  .  azelastine (ASTELIN) 0.1 % nasal spray, Place 2 sprays into both nostrils 2 (two) times daily., Disp: 30 mL, Rfl: 6 .  azelastine (OPTIVAR) 0.05 % ophthalmic solution, drop affect eye twice a day, Disp: 6 mL, Rfl: 3 .  Blood Glucose Monitoring Suppl (ONE TOUCH ULTRA SYSTEM KIT) w/Device KIT, 1 kit by Does not apply route once., Disp: 1 each, Rfl: 0 .  dexlansoprazole (DEXILANT) 60 MG capsule, Take 1 capsule (60 mg total) by mouth daily., Disp: 30 capsule, Rfl: 12 .  glipiZIDE (GLUCOTROL XL) 10 MG 24 hr tablet, Take 1 tablet (10 mg total) by mouth daily., Disp: 30 tablet, Rfl: 3 .  glucose blood (ONE TOUCH ULTRA TEST) test strip, Check blood sugar 3 times daily E11.65 (uncontrolled insulin dependent diabetes), Disp: 100 each, Rfl: 12 .  insulin lispro (HUMALOG KWIKPEN) 100 UNIT/ML KiwkPen, Inject into the skin., Disp: , Rfl:  .  INVOKAMET 150-500 MG TABS, TAKE 1 TABLET BY MOUTH TWICE DAILY, Disp: 60 tablet, Rfl: 5 .  mometasone (ELOCON) 0.1 % cream, One application daily as needed for rash, Disp: 45 g, Rfl: 1 .  nitroGLYCERIN (NITROSTAT) 0.4 MG SL tablet, Place under the tongue., Disp: , Rfl:  .  oxyCODONE-acetaminophen (PERCOCET/ROXICET) 5-325 MG tablet, Take 1-2 tablets by mouth every 6 (six) hours as needed for severe pain., Disp: 150 tablet, Rfl: 0 .  pioglitazone (ACTOS) 15 MG tablet, Take 1 tablet (15  mg total) by mouth daily., Disp: 30 tablet, Rfl: 4 .  ranitidine (ZANTAC) 150 MG capsule, Take 150 mg by mouth as needed for heartburn., Disp: , Rfl:  .  rosuvastatin (CRESTOR) 40 MG tablet, TAKE 1 TABLET(40 MG) BY MOUTH DAILY, Disp: 30 tablet, Rfl: 11 .  sertraline (ZOLOFT) 100 MG tablet, Take 2 tablets (200 mg total) by mouth daily., Disp: 60 tablet, Rfl: 3 .  traZODone (DESYREL) 100 MG tablet, TAKE 1/2 TO 1 AND 1/2 TABLETS(50 TO 150 MG) BY MOUTH AT BEDTIME AS NEEDED FOR SLEEP, Disp: 45 tablet, Rfl: 0 .  lithium carbonate 150 MG capsule, Take 1 capsule (150 mg total) by mouth daily with supper. (Patient not taking: Reported on 11/30/2017), Disp: 15 capsule, Rfl: 0 .  Probiotic Product (SOLUBLE FIBER/PROBIOTICS PO), Take by mouth once., Disp: , Rfl:   Review of Systems  Constitutional: Negative for appetite change, chills and fever.  Respiratory: Negative for chest tightness, shortness of breath and wheezing.   Cardiovascular: Negative for chest pain and palpitations.  Gastrointestinal: Negative for abdominal pain, nausea and vomiting.    Social History   Tobacco Use  . Smoking status: Former Smoker    Packs/day: 0.25    Years: 30.00    Pack years: 7.50    Types: Cigarettes    Last attempt to quit: 08/08/2013    Years since quitting: 4.3  . Smokeless tobacco: Never Used  Substance Use Topics  . Alcohol use: No   Objective:   BP 113/69 (BP Location: Right Arm, Patient Position: Sitting, Cuff Size: Large)   Pulse 63   Temp 98.3 F (36.8 C) (Oral)   Resp 16   Ht _0  (1.727 m)   Wt 200 lb (90.7 kg)   SpO2 96%   BMI 30.41 kg/m  Vitals:   11/30/17 1043  BP: 113/69  Pulse: 63  Resp: 16  Temp: 98.3 F (36.8 C)  TempSrc: Oral  SpO2: 96%  Weight: 200 lb (90.7 kg)  Height: _1  (1.727 m)     Physical Exam   General Appearance:    Alert, cooperative, no distress  HEENT:  Mild nasal congestion with yellow discharge.   Eyes:    PERRL, conjunctiva/corneas clear, EOM's  intact       Lungs:     Clear to auscultation bilaterally, respirations unlabored  Heart:    Regular rate and rhythm  Neurologic:   Awake, alert, oriented x 3. No apparent focal neurological           defect.       Results for orders placed or performed in visit on 11/30/17  POCT glycosylated hemoglobin (Hb A1C)  Result Value Ref Range   Hemoglobin A1C 8.3 (A) 4.0 - 5.6 %   HbA1c POC (<> result, manual entry)     HbA1c, POC (prediabetic range)     HbA1c, POC (controlled diabetic range)     Est. average glucose Bld gHb Est-mCnc 192  Assessment & Plan:    . 1. Uncontrolled type 2 diabetes mellitus with hyperglycemia ([place]) Doing better with reducing candies in diet and addition of pioglitazone. Continue current medications.  Follow up 3=4 months.  - POCT glycosylated hemoglobin (Hb A1C)  2. 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  - oxyCODONE-acetaminophen (PERCOCET/ROXICET) 5-325 MG tablet; Take 1-2 tablets by mouth every 6 (six) hours as needed for severe pain.  Dispense: 150 tablet; Refill: 0  3. Upper respiratory tract infection, unspecified type - amoxicillin (AMOXIL) 500 MG capsule; Take 1 capsule (500 mg total) by mouth 3 (three) times daily for 10 days.  Dispense: 30 capsule; Refill: 0  4. Thrush, oral  - fluconazole (DIFLUCAN) 100 MG tablet; Take 1 tablet (100 mg total) by mouth daily for 10 days.  Dispense: 10 tablet; Refill: 0 Call if symptoms change or if not rapidly improving.           [name], MD  [facility]

## 2017-12-03 DIAGNOSIS — B370 Candidal stomatitis: Secondary | ICD-10-CM | POA: Insufficient documentation

## 2017-12-12 ENCOUNTER — Ambulatory Visit (INDEPENDENT_AMBULATORY_CARE_PROVIDER_SITE_OTHER): Payer: Self-pay | Admitting: Licensed Clinical Social Worker

## 2017-12-12 ENCOUNTER — Other Ambulatory Visit: Payer: Self-pay

## 2017-12-12 ENCOUNTER — Ambulatory Visit (INDEPENDENT_AMBULATORY_CARE_PROVIDER_SITE_OTHER): Payer: Self-pay | Admitting: Psychiatry

## 2017-12-12 ENCOUNTER — Encounter: Payer: Self-pay | Admitting: Psychiatry

## 2017-12-12 VITALS — BP 143/77 | HR 63 | Temp 97.7°F | Wt 198.8 lb

## 2017-12-12 DIAGNOSIS — F5105 Insomnia due to other mental disorder: Secondary | ICD-10-CM

## 2017-12-12 DIAGNOSIS — F411 Generalized anxiety disorder: Secondary | ICD-10-CM

## 2017-12-12 DIAGNOSIS — F331 Major depressive disorder, recurrent, moderate: Secondary | ICD-10-CM

## 2017-12-12 DIAGNOSIS — F410 Panic disorder [episodic paroxysmal anxiety] without agoraphobia: Secondary | ICD-10-CM

## 2017-12-12 MED ORDER — TRAZODONE HCL 100 MG PO TABS: 200.0000 mg | ORAL_TABLET | Freq: Every day | ORAL | 1 refills | Status: DC

## 2017-12-12 MED ORDER — LAMOTRIGINE 25 MG PO TABS: 25.0000 mg | ORAL_TABLET | Freq: Every day | ORAL | 0 refills | Status: DC
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
## 2017-12-12 NOTE — Progress Notes (Signed)
[facility] MD OP Progress Note  12/12/2017 3:28 PM Vincent Hall  MRN:  [id number]  Chief Complaint: ' I am here for follow up.' Chief Complaint    Follow-up; Medication Refill     HPI: Vincent Hall is a 52 year old Hispanic male, lives in [place], unemployed, married, has a history of depression, anxiety, sleep problems, coronary artery disease, status post bypass graft, hypertension, hyperlipidemia, diabetes, presented to the clinic today for a follow-up visit.  New  Patient today continues to report depressive symptoms.  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  Patient reports he was hoping that someone will call him back however they have not.  Patient reports he did feel suicidal the past week however he was able to cope with it since he thought about his family and did not want to hurt them.  He reports that he will not act on his suicidal thoughts and wants to make changes with his medications today.  Pt continues to struggle with some anger issues.  He has started seeing our therapist Merleen Nicely for psychotherapy sessions.  He can continue to see his  therapist on a more frequent basis to manage his mood lability and anger issues.  Patient reports sleep is fair.  Patient denies any perceptual disturbances.  Patient denies any homicidality.  Patient reports he did not take the lithium.  Discussed another mood stabilizer to augment the Zoloft.  He agrees to start Lamictal.  Collateral  information was obtained from patient's wife-Vincent Hall at [phone].  Discussed patient's care as well as his recent suicidal ideation with wife.  Patient was also included in the conversation.  Crisis plan discussed with patient as well as wife.  Discussed medication changes with wife.  Wife reports that she will make sure that he gets help if he is in a crisis again.  She will also make sure that he takes his medications  this time.   Visit Diagnosis:    ICD-10-CM   1. MDD (major depressive disorder), recurrent episode, moderate ([facility]) F33.1   2. GAD (generalized anxiety disorder) F41.1   3. Insomnia due to mental condition F51.05   4. Panic attacks F41.0     Past Psychiatric History: Reviewed past psychiatric history from my progress note on 09/05/2017.  Past trials of Zoloft, Xanax, Ambien, Depakote  Past Medical History:  Past Medical History:  Diagnosis Date  . Asthma   . Coronary artery disease   . Fatty liver   . History of MI (myocardial infarction)     Past Surgical History:  Procedure Laterality Date  . APPENDECTOMY    . CARDIAC CATHETERIZATION  06/24/2013  . COLONOSCOPY WITH PROPOFOL N/A 09/23/2014   Procedure: COLONOSCOPY WITH PROPOFOL;  Surgeon: Lucilla Lame, MD;  Location: [facility];  Service: Endoscopy;  Laterality: N/A;  . CORONARY ANGIOPLASTY     stent placement to LAD  . CORONARY ARTERY BYPASS GRAFT  07/31/2013   x 3 vessels  . FINGER SURGERY    . NASAL SINUS SURGERY    . POLYPECTOMY  09/23/2014   Procedure: POLYPECTOMY;  Surgeon: Lucilla Lame, MD;  Location: [facility];  Service: Endoscopy;;    Family Psychiatric History: Denies  Family History:  Family History  Problem Relation Age of Onset  . Hyperlipidemia Mother   . Heart attack Father   . Hyperlipidemia Father   . Diabetes Father   .  Heart disease Father     Social History: I have reviewed social history from my progress note on 09/20/2017. Social History   Socioeconomic History  . Marital status: Married    Spouse name: clementin  . Number of children: 2  . Years of education: HS Grad  . Highest education level: Associate degree: occupational, Hotel manager, or vocational program  Occupational History  . Occupation: [employer]  Social Needs  . Financial resource strain: Somewhat hard  . Food insecurity:    Worry: Never true    Inability: Never true  . Transportation needs:    Medical: No     Non-medical: No  Tobacco Use  . Smoking status: Former Smoker    Packs/day: 0.25    Years: 30.00    Pack years: 7.50    Types: Cigarettes    Last attempt to quit: 08/08/2013    Years since quitting: 4.3  . Smokeless tobacco: Never Used  Substance and Sexual Activity  . Alcohol use: No  . Drug use: No  . Sexual activity: Yes  Lifestyle  . Physical activity:    Days per week: 5 days    Minutes per session: 60 min  . Stress: Very much  Relationships  . Social connections:    Talks on phone: Not on file    Gets together: Not on file    Attends religious service: Never    Active member of club or organization: No    Attends meetings of clubs or organizations: Never    Relationship status: Married  Other Topics Concern  . Not on file  Social History Narrative  . Not on file    Allergies:  Allergies  Allergen Reactions  . Montelukast Hives    Singular  . Singulair [Montelukast Sodium] Hives  . Atorvastatin Other (See Comments)    Muscle pain, neck stiffness    Metabolic Disorder Labs: Lab Results  Component Value Date   HGBA1C 8.3 (A) 11/30/2017   No results found for: PROLACTIN Lab Results  Component Value Date   CHOL 167 07/26/2017   TRIG 216 (H) 07/26/2017   HDL 45 07/26/2017   CHOLHDL 3.7 07/26/2017   LDLCALC 79 07/26/2017   LDLCALC 124 (H) 01/31/2017   Lab Results  Component Value Date   TSH 1.90 01/31/2017   TSH 2.31 07/02/2014    Therapeutic Level Labs: No results found for: LITHIUM No results found for: VALPROATE No components found for:  CBMZ  Current Medications: Current Outpatient Medications  Medication Sig Dispense Refill  . albuterol (PROVENTIL HFA;VENTOLIN HFA) 108 (90 Base) MCG/ACT inhaler Inhale 2 puffs into the lungs every 6 (six) hours as needed for wheezing or shortness of breath. 1 Inhaler 3  . ALPRAZolam (XANAX) 0.5 MG tablet TAKE 1 TABLET BY MOUTH TWICE DAILY AS NEEDED FOR ANXIETY 60 tablet 5  . aspirin 81 MG tablet Take 81  mg by mouth daily.    Marland Kitchen azelastine (ASTELIN) 0.1 % nasal spray Place 2 sprays into both nostrils 2 (two) times daily. 30 mL 6  . azelastine (OPTIVAR) 0.05 % ophthalmic solution drop affect eye twice a day 6 mL 3  . Blood Glucose Monitoring Suppl (ONE TOUCH ULTRA SYSTEM KIT) w/Device KIT 1 kit by Does not apply route once. 1 each 0  . dexlansoprazole (DEXILANT) 60 MG capsule Take 1 capsule (60 mg total) by mouth daily. 30 capsule 12  . glipiZIDE (GLUCOTROL XL) 10 MG 24 hr tablet Take 1 tablet (10 mg total) by mouth daily.  30 tablet 3  . glucose blood (ONE TOUCH ULTRA TEST) test strip Check blood sugar 3 times daily E11.65 (uncontrolled insulin dependent diabetes) 100 each 12  . insulin lispro (HUMALOG KWIKPEN) 100 UNIT/ML KiwkPen Inject into the skin.    . INVOKAMET 150-500 MG TABS TAKE 1 TABLET BY MOUTH TWICE DAILY 60 tablet 5  . mometasone (ELOCON) 0.1 % cream One application daily as needed for rash 45 g 1  . nitroGLYCERIN (NITROSTAT) 0.4 MG SL tablet Place under the tongue.    Marland Kitchen oxyCODONE-acetaminophen (PERCOCET/ROXICET) 5-325 MG tablet Take 1-2 tablets by mouth every 6 (six) hours as needed for severe pain. 150 tablet 0  . pioglitazone (ACTOS) 15 MG tablet Take 1 tablet (15 mg total) by mouth daily. 30 tablet 4  . ranitidine (ZANTAC) 150 MG capsule Take 150 mg by mouth as needed for heartburn.    . rosuvastatin (CRESTOR) 40 MG tablet TAKE 1 TABLET(40 MG) BY MOUTH DAILY 30 tablet 11  . sertraline (ZOLOFT) 100 MG tablet Take 2 tablets (200 mg total) by mouth daily. 60 tablet 3  . traZODone (DESYREL) 100 MG tablet Take 2 tablets (200 mg total) by mouth at bedtime. 60 tablet 1  . lamoTRIgine (LAMICTAL) 25 MG tablet Take 1 tablet (25 mg total) by mouth daily. 30 tablet 0   No current facility-administered medications for this visit.      Musculoskeletal: Strength & Muscle Tone: within normal limits Gait & Station: normal Patient leans: N/A  Psychiatric Specialty Exam: Review of Systems   Psychiatric/Behavioral: Positive for depression.  All other systems reviewed and are negative.   Blood pressure (!) 143/77, pulse 63, temperature 97.7 F (36.5 C), temperature source Oral, weight 198 lb 12.8 oz (90.2 kg).Body mass index is 30.23 kg/m.  General Appearance: Casual  Eye Contact:  Fair  Speech:  Clear and Coherent  Volume:  Normal  Mood:  Dysphoric  Affect:  Tearful  Thought Process:  Goal Directed and Descriptions of Associations: Intact  Orientation:  Full (Time, Place, and Person)  Thought Content: Logical   Suicidal Thoughts:  No  Reports suicidal thoughts past week , but reports he loves his family too much and will not act on the thoughts  Homicidal Thoughts:  No  Memory:  Immediate;   Fair Recent;   Fair Remote;   Fair  Judgement:  Fair  Insight:  Fair  Psychomotor Activity:  Normal  Concentration:  Concentration: Fair and Attention Span: Fair  Recall:  [employer] of Knowledge: Fair  Language: Fair  Akathisia:  No  Handed:  Right  AIMS (if indicated): NA  Assets:  Communication Skills Desire for Improvement Housing Intimacy  ADL's:  Intact  Cognition: WNL  Sleep:  Fair   Screenings: PHQ2-9     Office Visit from 09/04/2017 in [facility] Visit from 06/13/2016 in [facility]  PHQ-2 Total Score  3  1  PHQ-9 Total Score  17  11       Assessment and Plan: Nestor is a 52 year old Hispanic male who has a history of depression, anxiety, coronary artery disease status post bypass graft of heart, hypertension, hyperlipidemia, diabetes mellitus, presented to the clinic today for a follow-up visit.  Patient continues to struggle with anxiety, depression and anger issues.  Patient has psychosocial stressors including multiple medical problems, inability to find employment and so on.  Patient did have some suicidal thoughts the past week but currently denies it.  Collateral information was obtained from  wife and wife agrees to  monitor patient closely and get him help if he is in a crisis.  Discussed medication changes as noted below. Acute risk for suicide is low given that he denies it now, denies past hx of attempts, denies family hx, denies substance abuse, denies hx of trauma. Has good social support , is motivated to start medications as well as psychotherapy. Chronic risk is moderate .  Plan MDD Continue Zoloft 200 mg p.o. daily Discontinue lithium for noncompliance Start Lamictal 25 mg p.o. Daily.  For GAD Continue Zoloft 200 mg p.o. daily Continue psychotherapy with Ms. Alden Hipp.  For insomnia Provided sleep hygiene techniques. Increase trazodone to 200 mg p.o. nightly  Crisis plan discussed with patient.  Also discussed patient care with his wife at [phone].   Follow-up in clinic in 2 weeks or sooner if needed.  More than 50 % of the time was spent for psychoeducation and supportive psychotherapy and care coordination.  This note was generated in part or whole with voice recognition software. Voice recognition is usually quite accurate but there are transcription errors that can and very often do occur. I apologize for any typographical errors that were not detected and corrected.         Ursula Alert, MD 12/12/2017, 3:28 PM

## 2017-12-12 NOTE — Patient Instructions (Signed)
Lamotrigine tablets What is this medicine? LAMOTRIGINE (la MOE tri jeen) is used to control seizures in adults and children with epilepsy and Lennox-Gastaut syndrome. It is also used in adults to treat bipolar disorder. This medicine may be used for other purposes; ask your health care provider or pharmacist if you have questions. COMMON BRAND NAME(S): Lamictal What should I tell my health care provider before I take this medicine? They need to know if you have any of these conditions: -a history of depression or bipolar disorder -aseptic meningitis during prior use of lamotrigine -folate deficiency -kidney disease -liver disease -suicidal thoughts, plans, or attempt; a previous suicide attempt by you or a family member -an unusual or allergic reaction to lamotrigine or other seizure medications, other medicines, foods, dyes, or preservatives -pregnant or trying to get pregnant -breast-feeding How should I use this medicine? Take this medicine by mouth with a glass of water. Follow the directions on the prescription label. Do not chew these tablets. If this medicine upsets your stomach, take it with food or milk. Take your doses at regular intervals. Do not take your medicine more often than directed. A special MedGuide will be given to you by the pharmacist with each new prescription and refill. Be sure to read this information carefully each time. Talk to your pediatrician regarding the use of this medicine in children. While this drug may be prescribed for children as young as 2 years for selected conditions, precautions do apply. Overdosage: If you think you have taken too much of this medicine contact a poison control center or emergency room at once. NOTE: This medicine is only for you. Do not share this medicine with others. What if I miss a dose? If you miss a dose, take it as soon as you can. If it is almost time for your next dose, take only that dose. Do not take double or extra  doses. What may interact with this medicine? -carbamazepine -male hormones, including contraceptive or birth control pills -methotrexate -phenobarbital -phenytoin -primidone -pyrimethamine -rifampin -trimethoprim -valproic acid This list may not describe all possible interactions. Give your health care provider a list of all the medicines, herbs, non-prescription drugs, or dietary supplements you use. Also tell them if you smoke, drink alcohol, or use illegal drugs. Some items may interact with your medicine. What should I watch for while using this medicine? Visit your doctor or health care professional for regular checks on your progress. If you take this medicine for seizures, wear a Medic Alert bracelet or necklace. Carry an identification card with information about your condition, medicines, and doctor or health care professional. It is important to take this medicine exactly as directed. When first starting treatment, your dose will need to be adjusted slowly. It may take weeks or months before your dose is stable. You should contact your doctor or health care professional if your seizures get worse or if you have any new types of seizures. Do not stop taking this medicine unless instructed by your doctor or health care professional. Stopping your medicine suddenly can increase your seizures or their severity. Contact your doctor or health care professional right away if you develop a rash while taking this medicine. Rashes may be very severe and sometimes require treatment in the hospital. Deaths from rashes have occurred. Serious rashes occur more often in children than adults taking this medicine. It is more common for these serious rashes to occur during the first 2 months of treatment, but a rash can   occur at any time. You may get drowsy, dizzy, or have blurred vision. Do not drive, use machinery, or do anything that needs mental alertness until you know how this medicine affects you.  To reduce dizzy or fainting spells, do not sit or stand up quickly, especially if you are an older patient. Alcohol can increase drowsiness and dizziness. Avoid alcoholic drinks. If you are taking this medicine for bipolar disorder, it is important to report any changes in your mood to your doctor or health care professional. If your condition gets worse, you get mentally depressed, feel very hyperactive or manic, have difficulty sleeping, or have thoughts of hurting yourself or committing suicide, you need to get help from your health care professional right away. If you are a caregiver for someone taking this medicine for bipolar disorder, you should also report these behavioral changes right away. The use of this medicine may increase the chance of suicidal thoughts or actions. Pay special attention to how you are responding while on this medicine. Your mouth may get dry. Chewing sugarless gum or sucking hard candy, and drinking plenty of water may help. Contact your doctor if the problem does not go away or is severe. Women who become pregnant while using this medicine may enroll in the [place] Pregnancy Registry by calling [phone]. This registry collects information about the safety of antiepileptic drug use during pregnancy. What side effects may I notice from receiving this medicine? Side effects that you should report to your doctor or health care professional as soon as possible: -allergic reactions like skin rash, itching or hives, swelling of the face, lips, or tongue -blurred or double vision -difficulty walking or controlling muscle movements -fever -headache, stiff neck, and sensitivity to light -painful sores in the mouth, eyes, or nose -redness, blistering, peeling or loosening of the skin, including inside the mouth -severe muscle pain -swollen lymph glands -uncontrollable eye movements -unusual bruising or bleeding -unusually weak or  tired -vomiting -worsening of mood, thoughts or actions of suicide or dying -yellowing of the eyes or skin Side effects that usually do not require medical attention (report to your doctor or health care professional if they continue or are bothersome): -diarrhea or constipation -difficulty sleeping -nausea -tremors This list may not describe all possible side effects. Call your doctor for medical advice about side effects. You may report side effects to FDA at 1-800-FDA-1088. Where should I keep my medicine? Keep out of reach of children. Store at room temperature between 15 and 30 degrees C (59 and 86 degrees F). Throw away any unused medicine after the expiration date. NOTE: This sheet is a summary. It may not cover all possible information. If you have questions about this medicine, talk to your doctor, pharmacist, or health care provider.  2018 Elsevier/Gold Standard (2015-04-29 09:29:40)    Crisis Hotline [employer] Numbers  [place] - [place] or 1-800-SUICIDE  Crisis Text Line - Text HOME to [phone]  Crisis chat - UVOZDGUY QIHK   [phone]

## 2017-12-19 ENCOUNTER — Other Ambulatory Visit: Payer: Self-pay | Admitting: Family Medicine

## 2017-12-19 DIAGNOSIS — R0789 Other chest pain: Secondary | ICD-10-CM

## 2017-12-19 MED ORDER — OXYCODONE-ACETAMINOPHEN 10-325 MG PO TABS: 1.0000 | ORAL_TABLET | Freq: Four times a day (QID) | ORAL | 0 refills | Status: DC | PRN

## 2017-12-19 NOTE — Telephone Encounter (Signed)
Pt is requesting refill of Oxycodone however he states he is taking two 5MG  tablets and is requesting his RX be changed to one 10 MG tablet instead.   He is also requesting refill of Linzess states Dr. Allen Norris usually gives it to him however he is not able to find Dr. Allen Norris since he has moved and wouldl ike for Dr. Caryn Section to manage.

## 2017-12-21 ENCOUNTER — Other Ambulatory Visit: Payer: Self-pay | Admitting: Family Medicine

## 2017-12-21 DIAGNOSIS — E1165 Type 2 diabetes mellitus with hyperglycemia: Secondary | ICD-10-CM

## 2018-01-07 ENCOUNTER — Telehealth: Payer: Self-pay

## 2018-01-07 ENCOUNTER — Telehealth: Payer: Self-pay | Admitting: Family Medicine

## 2018-01-07 DIAGNOSIS — F419 Anxiety disorder, unspecified: Secondary | ICD-10-CM

## 2018-01-07 DIAGNOSIS — F410 Panic disorder [episodic paroxysmal anxiety] without agoraphobia: Secondary | ICD-10-CM

## 2018-01-07 MED ORDER — NYSTATIN 100000 UNIT/ML MT SUSP: 5.0000 mL | Freq: Four times a day (QID) | OROMUCOSAL | 0 refills | Status: DC

## 2018-01-07 MED ORDER — OXYCODONE-ACETAMINOPHEN 10-325 MG PO TABS: 1.0000 | ORAL_TABLET | Freq: Four times a day (QID) | ORAL | 0 refills | Status: DC | PRN

## 2018-01-07 MED ORDER — SERTRALINE HCL 100 MG PO TABS: 200.0000 mg | ORAL_TABLET | Freq: Every day | ORAL | 1 refills | Status: DC

## 2018-01-07 NOTE — Telephone Encounter (Signed)
Pt called for a status update on his refill request for oxyCODONE-acetaminophen (PERCOCET) 10-325 MG tablet. Pt request it be sent to [facility]. Pt stated he is completely out of the medication.   Late Rx: 12/19/17 Please advise. Thanks TNP

## 2018-01-07 NOTE — Telephone Encounter (Signed)
Please advise 

## 2018-01-07 NOTE — Telephone Encounter (Signed)
I have sent his Zoloft to his pharmacy. Will not send Xanax since based on my records he stopped taking it .

## 2018-01-07 NOTE — Telephone Encounter (Signed)
1 - Patient came by stating he received #90 pills of  Oxycodone 10-325 needed #120 to last the entire month.   He takes them 1 every 6 hours.   He is out and needs refill  2 - has thrush coming back in mouth and wants refill on Nystatin/Viscous Lidocaine ... Uses [facility] in [place].

## 2018-01-07 NOTE — Telephone Encounter (Signed)
pt stopped by office stated he needs enough medication to get to his appt on  01-11-18  sertraline (ZOLOFT) 100 MG tablet  Medication  Date: 08/01/2017 Department: [facility] Ordering/Authorizing: Birdie Sons, MD  Order Providers   Prescribing Provider Encounter Provider  Fisher, Kirstie Peri, MD Birdie Sons, MD  Outpatient Medication Detail    Disp Refills Start End   sertraline (ZOLOFT) 100 MG tablet 60 tablet 3 08/01/2017    Sig - Route: Take 2 tablets (200 mg total) by mouth daily. - Oral   Sent to pharmacy as: sertraline (ZOLOFT) 100 MG tablet   E-Prescribing Status: Receipt confirmed by pharmacy (08/01/2017 11:43 AM EDT)    ALPRAZolam Duanne Moron) 0.5 MG tablet  Medication  Date: 07/06/2017 Department: [facility] Ordering/Authorizing: Birdie Sons, MD  Order Providers   Prescribing Provider Encounter Provider  Fisher, Kirstie Peri, MD Birdie Sons, MD  Outpatient Medication Detail    Disp Refills Start End   ALPRAZolam Duanne Moron) 0.5 MG tablet 60 tablet 5 07/06/2017    Sig: TAKE 1 TABLET BY MOUTH TWICE DAILY AS NEEDED FOR ANXIETY   Sent to pharmacy as: ALPRAZolam Duanne Moron) 0.5 MG tablet   E-Prescribing Status: Receipt confirmed by pharmacy (07/06/2017 4:57 PM EDT)

## 2018-01-08 ENCOUNTER — Inpatient Hospital Stay
Admission: AD | Admit: 2018-01-08 | Discharge: 2018-01-10 | DRG: 885 | Disposition: A | Discharge status: Home / Self Care | Payer: BLUE CROSS/BLUE SHIELD | Source: Intra-hospital | Attending: Psychiatry | Admitting: Psychiatry

## 2018-01-08 ENCOUNTER — Encounter: Payer: Self-pay | Admitting: Emergency Medicine

## 2018-01-08 ENCOUNTER — Encounter: Payer: Self-pay | Admitting: Family Medicine

## 2018-01-08 ENCOUNTER — Other Ambulatory Visit: Payer: Self-pay

## 2018-01-08 ENCOUNTER — Ambulatory Visit (INDEPENDENT_AMBULATORY_CARE_PROVIDER_SITE_OTHER): Payer: BLUE CROSS/BLUE SHIELD | Admitting: Family Medicine

## 2018-01-08 ENCOUNTER — Encounter: Payer: Self-pay | Admitting: Psychiatry

## 2018-01-08 ENCOUNTER — Emergency Department (EMERGENCY_DEPARTMENT_HOSPITAL)
Admission: EM | Admit: 2018-01-08 | Discharge: 2018-01-08 | Disposition: A | Discharge status: Other Acute Inpatient Hospital | Payer: BLUE CROSS/BLUE SHIELD | Source: Home / Self Care | Attending: Emergency Medicine | Admitting: Emergency Medicine

## 2018-01-08 ENCOUNTER — Other Ambulatory Visit: Payer: Self-pay | Admitting: Psychiatry

## 2018-01-08 VITALS — BP 140/80 | HR 61 | Temp 97.5°F | Resp 16 | Wt 196.0 lb

## 2018-01-08 DIAGNOSIS — I252 Old myocardial infarction: Secondary | ICD-10-CM | POA: Diagnosis not present

## 2018-01-08 DIAGNOSIS — Z794 Long term (current) use of insulin: Secondary | ICD-10-CM

## 2018-01-08 DIAGNOSIS — G8929 Other chronic pain: Secondary | ICD-10-CM

## 2018-01-08 DIAGNOSIS — F329 Major depressive disorder, single episode, unspecified: Secondary | ICD-10-CM | POA: Insufficient documentation

## 2018-01-08 DIAGNOSIS — I2510 Atherosclerotic heart disease of native coronary artery without angina pectoris: Secondary | ICD-10-CM | POA: Diagnosis present

## 2018-01-08 DIAGNOSIS — I25119 Atherosclerotic heart disease of native coronary artery with unspecified angina pectoris: Secondary | ICD-10-CM | POA: Diagnosis present

## 2018-01-08 DIAGNOSIS — K219 Gastro-esophageal reflux disease without esophagitis: Secondary | ICD-10-CM | POA: Diagnosis present

## 2018-01-08 DIAGNOSIS — F332 Major depressive disorder, recurrent severe without psychotic features: Principal | ICD-10-CM | POA: Diagnosis present

## 2018-01-08 DIAGNOSIS — Z951 Presence of aortocoronary bypass graft: Secondary | ICD-10-CM

## 2018-01-08 DIAGNOSIS — Z888 Allergy status to other drugs, medicaments and biological substances status: Secondary | ICD-10-CM | POA: Diagnosis not present

## 2018-01-08 DIAGNOSIS — Z833 Family history of diabetes mellitus: Secondary | ICD-10-CM | POA: Diagnosis not present

## 2018-01-08 DIAGNOSIS — T1491XA Suicide attempt, initial encounter: Secondary | ICD-10-CM

## 2018-01-08 DIAGNOSIS — Z87891 Personal history of nicotine dependence: Secondary | ICD-10-CM

## 2018-01-08 DIAGNOSIS — Z7982 Long term (current) use of aspirin: Secondary | ICD-10-CM

## 2018-01-08 DIAGNOSIS — Z79899 Other long term (current) drug therapy: Secondary | ICD-10-CM

## 2018-01-08 DIAGNOSIS — T424X1A Poisoning by benzodiazepines, accidental (unintentional), initial encounter: Secondary | ICD-10-CM

## 2018-01-08 DIAGNOSIS — R0789 Other chest pain: Secondary | ICD-10-CM | POA: Diagnosis not present

## 2018-01-08 DIAGNOSIS — R45851 Suicidal ideations: Secondary | ICD-10-CM | POA: Diagnosis not present

## 2018-01-08 DIAGNOSIS — G4700 Insomnia, unspecified: Secondary | ICD-10-CM | POA: Diagnosis present

## 2018-01-08 DIAGNOSIS — Z9861 Coronary angioplasty status: Secondary | ICD-10-CM

## 2018-01-08 DIAGNOSIS — Z7984 Long term (current) use of oral hypoglycemic drugs: Secondary | ICD-10-CM | POA: Diagnosis not present

## 2018-01-08 DIAGNOSIS — R4587 Impulsiveness: Secondary | ICD-10-CM | POA: Diagnosis present

## 2018-01-08 DIAGNOSIS — Z955 Presence of coronary angioplasty implant and graft: Secondary | ICD-10-CM

## 2018-01-08 DIAGNOSIS — Z8349 Family history of other endocrine, nutritional and metabolic diseases: Secondary | ICD-10-CM | POA: Diagnosis not present

## 2018-01-08 DIAGNOSIS — K224 Dyskinesia of esophagus: Secondary | ICD-10-CM | POA: Diagnosis present

## 2018-01-08 DIAGNOSIS — Z79891 Long term (current) use of opiate analgesic: Secondary | ICD-10-CM | POA: Diagnosis not present

## 2018-01-08 DIAGNOSIS — J45909 Unspecified asthma, uncomplicated: Secondary | ICD-10-CM | POA: Insufficient documentation

## 2018-01-08 DIAGNOSIS — Z8249 Family history of ischemic heart disease and other diseases of the circulatory system: Secondary | ICD-10-CM

## 2018-01-08 LAB — COMPREHENSIVE METABOLIC PANEL
ALT: 21 U/L (ref 0–44)
AST: 21 U/L (ref 15–41)
Albumin: 4.7 g/dL (ref 3.5–5.0)
Alkaline Phosphatase: 41 U/L (ref 38–126)
Anion gap: 10 (ref 5–15)
BUN: 12 mg/dL (ref 6–20)
CO2: 24 mmol/L (ref 22–32)
Calcium: 9.4 mg/dL (ref 8.9–10.3)
Chloride: 101 mmol/L (ref 98–111)
Creatinine, Ser: 0.82 mg/dL (ref 0.61–1.24)
GFR calc Af Amer: >60 mL/min (ref >60)
GFR calc non Af Amer: >60 mL/min (ref >60)
Glucose, Bld: 152 mg/dL — ABNORMAL HIGH (ref 70–99)
Potassium: 4.6 mmol/L (ref 3.5–5.1)
Sodium: 135 mmol/L (ref 135–145)
Total Bilirubin: 0.8 mg/dL (ref 0.3–1.2)
Total Protein: 7.4 g/dL (ref 6.5–8.1)

## 2018-01-08 LAB — URINE DRUG SCREEN, QUALITATIVE (ARMC ONLY)
Amphetamines, Ur Screen: NOT DETECTED
Barbiturates, Ur Screen: NOT DETECTED
Benzodiazepine, Ur Scrn: POSITIVE — AB
Cannabinoid 50 Ng, Ur ~~LOC~~: NOT DETECTED
Cocaine Metabolite,Ur ~~LOC~~: NOT DETECTED
MDMA (Ecstasy)Ur Screen: NOT DETECTED
Methadone Scn, Ur: NOT DETECTED
Opiate, Ur Screen: NOT DETECTED
Phencyclidine (PCP) Ur S: NOT DETECTED
Tricyclic, Ur Screen: NOT DETECTED

## 2018-01-08 LAB — SALICYLATE LEVEL: Salicylate Lvl: <7 mg/dL (ref 2.8–30.0)

## 2018-01-08 LAB — CBC
HCT: 44.2 % (ref 40.0–52.0)
Hemoglobin: 15 g/dL (ref 13.0–18.0)
MCH: 28.2 pg (ref 26.0–34.0)
MCHC: 33.9 g/dL (ref 32.0–36.0)
MCV: 83 fL (ref 80.0–100.0)
Platelets: 216 10*3/uL (ref 150–440)
RBC: 5.33 MIL/uL (ref 4.40–5.90)
RDW: 14 % (ref 11.5–14.5)
WBC: 8.2 10*3/uL (ref 3.8–10.6)

## 2018-01-08 LAB — ACETAMINOPHEN LEVEL: Acetaminophen (Tylenol), Serum: <10 ug/mL — ABNORMAL LOW (ref 10–30)

## 2018-01-08 LAB — ETHANOL: Alcohol, Ethyl (B): <10 mg/dL (ref <10)

## 2018-01-08 MED ORDER — OXYCODONE-ACETAMINOPHEN 5-325 MG PO TABS
1.0000 | ORAL_TABLET | Freq: Four times a day (QID) | ORAL | Status: DC | PRN
  Administered 2018-01-09: 2 via ORAL
  Administered 2018-01-09: 1 via ORAL
  Administered 2018-01-09 – 2018-01-10 (×3): 2 via ORAL
  Filled 2018-01-08 (×2): qty 2
  Filled 2018-01-08: qty 1
  Filled 2018-01-08 (×2): qty 2
  Filled 2018-01-08 (×2): qty 1

## 2018-01-08 MED ORDER — OXYCODONE-ACETAMINOPHEN 5-325 MG PO TABS
1.0000 | ORAL_TABLET | Freq: Four times a day (QID) | ORAL | Status: DC | PRN
  Administered 2018-01-08: 2 via ORAL
  Filled 2018-01-08: qty 2

## 2018-01-08 MED ORDER — GLIPIZIDE ER 5 MG PO TB24
5.0000 mg | ORAL_TABLET | Freq: Every day | ORAL | Status: DC
  Administered 2018-01-09 – 2018-01-10 (×2): 5 mg via ORAL
  Filled 2018-01-08 (×3): qty 1

## 2018-01-08 MED ORDER — MAGNESIUM HYDROXIDE 400 MG/5ML PO SUSP: 30.0000 mL | Freq: Every day | ORAL | Status: DC | PRN

## 2018-01-08 MED ORDER — ROSUVASTATIN CALCIUM 20 MG PO TABS
40.0000 mg | ORAL_TABLET | Freq: Every day | ORAL | Status: DC
  Administered 2018-01-09: 40 mg via ORAL
  Filled 2018-01-08: qty 2

## 2018-01-08 MED ORDER — AZELASTINE HCL 0.1 % NA SOLN
2.0000 | Freq: Two times a day (BID) | NASAL | Status: DC
  Administered 2018-01-09 – 2018-01-10 (×2): 2 via NASAL
  Filled 2018-01-08: qty 30

## 2018-01-08 MED ORDER — ALBUTEROL SULFATE HFA 108 (90 BASE) MCG/ACT IN AERS
2.0000 | INHALATION_SPRAY | Freq: Four times a day (QID) | RESPIRATORY_TRACT | Status: DC | PRN
  Filled 2018-01-08: qty 6.7

## 2018-01-08 MED ORDER — HYDROXYZINE HCL 50 MG PO TABS
50.0000 mg | ORAL_TABLET | Freq: Three times a day (TID) | ORAL | Status: DC | PRN
  Administered 2018-01-09: 50 mg via ORAL
  Filled 2018-01-08: qty 1

## 2018-01-08 MED ORDER — TRAZODONE HCL 100 MG PO TABS
100.0000 mg | ORAL_TABLET | Freq: Every day | ORAL | Status: DC
  Administered 2018-01-08: 100 mg via ORAL
  Filled 2018-01-08: qty 1

## 2018-01-08 MED ORDER — FAMOTIDINE 20 MG PO TABS
40.0000 mg | ORAL_TABLET | Freq: Once | ORAL | Status: AC
  Administered 2018-01-08: 40 mg via ORAL
  Filled 2018-01-08: qty 2

## 2018-01-08 MED ORDER — ALPRAZOLAM 0.5 MG PO TABS
0.5000 mg | ORAL_TABLET | Freq: Two times a day (BID) | ORAL | Status: DC
  Filled 2018-01-08: qty 1

## 2018-01-08 MED ORDER — ALUM & MAG HYDROXIDE-SIMETH 200-200-20 MG/5ML PO SUSP
30.0000 mL | ORAL | Status: DC | PRN
  Administered 2018-01-09 – 2018-01-10 (×2): 30 mL via ORAL
  Filled 2018-01-08 (×2): qty 30

## 2018-01-08 MED ORDER — ASPIRIN EC 81 MG PO TBEC
81.0000 mg | DELAYED_RELEASE_TABLET | Freq: Every day | ORAL | Status: DC
  Filled 2018-01-08: qty 1

## 2018-01-08 MED ORDER — ACETAMINOPHEN 325 MG PO TABS
650.0000 mg | ORAL_TABLET | Freq: Four times a day (QID) | ORAL | Status: DC | PRN
  Administered 2018-01-09 (×2): 650 mg via ORAL
  Filled 2018-01-08 (×3): qty 2

## 2018-01-08 MED ORDER — TRAZODONE HCL 100 MG PO TABS
100.0000 mg | ORAL_TABLET | Freq: Every day | ORAL | Status: DC
  Administered 2018-01-09: 100 mg via ORAL
  Filled 2018-01-08: qty 1

## 2018-01-08 MED ORDER — ROSUVASTATIN CALCIUM 20 MG PO TABS
40.0000 mg | ORAL_TABLET | Freq: Every day | ORAL | Status: DC
  Filled 2018-01-08: qty 2

## 2018-01-08 MED ORDER — PIOGLITAZONE HCL 15 MG PO TABS
15.0000 mg | ORAL_TABLET | Freq: Every day | ORAL | Status: DC
  Filled 2018-01-08 (×2): qty 1

## 2018-01-08 MED ORDER — SERTRALINE HCL 100 MG PO TABS
100.0000 mg | ORAL_TABLET | Freq: Every day | ORAL | Status: DC
  Filled 2018-01-08: qty 1

## 2018-01-08 MED ORDER — LORAZEPAM 2 MG/ML IJ SOLN
2.0000 mg | Freq: Once | INTRAMUSCULAR | Status: AC
  Administered 2018-01-08: 2 mg via INTRAMUSCULAR
  Filled 2018-01-08: qty 1

## 2018-01-08 MED ORDER — ASPIRIN EC 81 MG PO TBEC
81.0000 mg | DELAYED_RELEASE_TABLET | Freq: Every day | ORAL | Status: DC
  Administered 2018-01-09 – 2018-01-10 (×2): 81 mg via ORAL
  Filled 2018-01-08 (×2): qty 1

## 2018-01-08 MED ORDER — GLIPIZIDE ER 5 MG PO TB24
5.0000 mg | ORAL_TABLET | Freq: Every day | ORAL | Status: DC
  Filled 2018-01-08: qty 1

## 2018-01-08 MED ORDER — SERTRALINE HCL 100 MG PO TABS
100.0000 mg | ORAL_TABLET | Freq: Every day | ORAL | Status: DC
  Administered 2018-01-09: 100 mg via ORAL
  Filled 2018-01-08: qty 1

## 2018-01-08 MED ORDER — AZELASTINE HCL 0.1 % NA SOLN
2.0000 | Freq: Two times a day (BID) | NASAL | Status: DC
  Administered 2018-01-08: 2 via NASAL
  Filled 2018-01-08: qty 30

## 2018-01-08 MED ORDER — ALPRAZOLAM 0.5 MG PO TABS
0.5000 mg | ORAL_TABLET | Freq: Two times a day (BID) | ORAL | Status: DC
  Administered 2018-01-08: 0.5 mg via ORAL
  Filled 2018-01-08: qty 1

## 2018-01-08 MED ORDER — HALOPERIDOL LACTATE 5 MG/ML IJ SOLN
5.0000 mg | Freq: Once | INTRAMUSCULAR | Status: AC
  Administered 2018-01-08: 5 mg via INTRAMUSCULAR
  Filled 2018-01-08: qty 1

## 2018-01-08 MED ORDER — PIOGLITAZONE HCL 15 MG PO TABS
15.0000 mg | ORAL_TABLET | Freq: Every day | ORAL | Status: DC
  Administered 2018-01-09 – 2018-01-10 (×2): 15 mg via ORAL
  Filled 2018-01-08 (×2): qty 1

## 2018-01-08 NOTE — ED Provider Notes (Addendum)
[facility] Emergency Department Provider Note  ____________________________________________   I have reviewed the triage vital signs and the nursing notes. Where available I have reviewed prior notes and, if possible and indicated, outside hospital notes.    HISTORY  Chief Complaint Suicidal    HPI Joaquim Tolen is a 52 y.o. male  With a history of CAD and asthma, chronic narcotic use chronic benzodiazepine use, states he has been off his benzodiazepines for several weeks, but now they are trying to taper him off his narcotics and is very angry about this he wants narcotics he states he will comes up he does not get narcotics.  He states that he has a very firm commitment to killing himself and if he gives his word that he will kill himself, that means he will kill himself.  He is very adamant on this point.  There is not really flexibility he states.  He is also threatening to kill staff if we detain him.  Is upset because he wants narcotics and they are not giving them to him.  He would also like me to give him narcotics for his chronic back pain.  Patient did apparently take some extra Xanax last night but did not swallow them all. No other ingestion.  He has not taken an overdose today.    Past Medical History:  Diagnosis Date  . Asthma   . Coronary artery disease   . Fatty liver   . History of MI (myocardial infarction)     Patient Active Problem List   Diagnosis Date Noted  . Thrush, oral 12/03/2017  . Current moderate episode of major depressive disorder without prior episode ([place]) 07/26/2017  . Anxiety 10/28/2014  . Arthralgia 10/28/2014  . Chest wall pain 10/28/2014  . Dysphagia 10/28/2014  . Fatigue 10/28/2014  . History of tobacco use 10/28/2014  . Hyperlipidemia, mixed 10/28/2014  . Insomnia 10/28/2014  . Chronic constipation 09/18/2014  . Esophageal reflux 09/18/2014  . Esophageal spasm 09/18/2014  . Coronary artery disease 07/07/2013   . STEMI (ST elevation myocardial infarction) ([place]) 06/22/2013  . Allergic rhinitis 06/18/2008    Past Surgical History:  Procedure Laterality Date  . APPENDECTOMY    . CARDIAC CATHETERIZATION  06/24/2013  . COLONOSCOPY WITH PROPOFOL N/A 09/23/2014   Procedure: COLONOSCOPY WITH PROPOFOL;  Surgeon: Lucilla Lame, MD;  Location: [place];  Service: Endoscopy;  Laterality: N/A;  . CORONARY ANGIOPLASTY     stent placement to LAD  . CORONARY ARTERY BYPASS GRAFT  07/31/2013   x 3 vessels  . FINGER SURGERY    . NASAL SINUS SURGERY    . POLYPECTOMY  09/23/2014   Procedure: POLYPECTOMY;  Surgeon: Lucilla Lame, MD;  Location: [facility];  Service: Endoscopy;;    Prior to Admission medications   Medication Sig Start Date End Date Taking? Authorizing Provider  albuterol (PROVENTIL HFA;VENTOLIN HFA) 108 (90 Base) MCG/ACT inhaler Inhale 2 puffs into the lungs every 6 (six) hours as needed for wheezing or shortness of breath. 06/13/16   Birdie Sons, MD  ALPRAZolam Duanne Moron) 0.5 MG tablet TAKE 1 TABLET BY MOUTH TWICE DAILY AS NEEDED FOR ANXIETY 07/06/17   Birdie Sons, MD  aspirin 81 MG tablet Take 81 mg by mouth daily.    [provider]  azelastine (ASTELIN) 0.1 % nasal spray Place 2 sprays into both nostrils 2 (two) times daily. 07/08/15   Birdie Sons, MD  azelastine (OPTIVAR) 0.05 % ophthalmic solution drop  affect eye twice a day 07/08/15   Birdie Sons, MD  Blood Glucose Monitoring Suppl (ONE TOUCH ULTRA SYSTEM KIT) w/Device KIT 1 kit by Does not apply route once. 07/13/15   Birdie Sons, MD  dexlansoprazole (DEXILANT) 60 MG capsule Take 1 capsule (60 mg total) by mouth daily. 04/25/17   Birdie Sons, MD  glipiZIDE (GLUCOTROL XL) 10 MG 24 hr tablet TAKE 1 TABLET(10 MG) BY MOUTH DAILY 12/21/17   Birdie Sons, MD  glucose blood (ONE TOUCH ULTRA TEST) test strip Check blood sugar 3 times daily E11.65 (uncontrolled insulin dependent diabetes) 07/13/15   Birdie Sons, MD  insulin lispro (HUMALOG KWIKPEN) 100 UNIT/ML KiwkPen Inject into the skin.    [provider]  INVOKAMET 150-500 MG TABS TAKE 1 TABLET BY MOUTH TWICE DAILY 07/06/17   Birdie Sons, MD  lamoTRIgine (LAMICTAL) 25 MG tablet TAKE 1 TABLET(25 MG) BY MOUTH DAILY 01/08/18   Ursula Alert, MD  mometasone (ELOCON) 0.1 % cream One application daily as needed for rash 11/16/17   Carmon Ginsberg, PA  nitroGLYCERIN (NITROSTAT) 0.4 MG SL tablet Place under the tongue.    [provider]  nystatin (MYCOSTATIN) 100000 UNIT/ML suspension Take 5 mLs (500,000 Units total) by mouth 4 (four) times daily. 01/07/18   Birdie Sons, MD  oxyCODONE-acetaminophen (PERCOCET) 10-325 MG tablet Take 1 tablet by mouth every 6 (six) hours as needed for pain. 01/07/18   Birdie Sons, MD  pioglitazone (ACTOS) 15 MG tablet Take 1 tablet (15 mg total) by mouth daily. 09/04/17   Birdie Sons, MD  ranitidine (ZANTAC) 150 MG capsule Take 150 mg by mouth as needed for heartburn.    [provider]  rosuvastatin (CRESTOR) 40 MG tablet TAKE 1 TABLET(40 MG) BY MOUTH DAILY 07/09/17   Birdie Sons, MD  sertraline (ZOLOFT) 100 MG tablet Take 2 tablets (200 mg total) by mouth daily. 01/07/18   Ursula Alert, MD  traZODone (DESYREL) 100 MG tablet Take 2 tablets (200 mg total) by mouth at bedtime. 12/12/17   Ursula Alert, MD    Allergies Montelukast; Singulair [montelukast sodium]; and Atorvastatin  Family History  Problem Relation Age of Onset  . Hyperlipidemia Mother   . Heart attack Father   . Hyperlipidemia Father   . Diabetes Father   . Heart disease Father     Social History Social History   Tobacco Use  . Smoking status: Former Smoker    Packs/day: 0.25    Years: 30.00    Pack years: 7.50    Types: Cigarettes    Last attempt to quit: 08/08/2013    Years since quitting: 4.4  . Smokeless tobacco: Never Used  Substance Use Topics  . Alcohol use: No  . Drug use: No     Review of Systems Constitutional: No fever/chills Eyes: No visual changes. ENT: No sore throat. No stiff neck no neck pain Cardiovascular: Denies chest pain. Respiratory: Denies shortness of breath. Gastrointestinal:   no vomiting.  No diarrhea.  No constipation. Genitourinary: Negative for dysuria. Musculoskeletal: Negative lower extremity swelling Skin: Negative for rash. Neurological: Negative for severe headaches, focal weakness or numbness.   ____________________________________________   PHYSICAL EXAM:  VITAL SIGNS: ED Triage Vitals  Enc Vitals Group     BP 01/08/18 1158 125/76     Pulse Rate 01/08/18 1158 66     Resp 01/08/18 1158 16     Temp 01/08/18 1158 97.9 F (36.6 C)  Temp Source 01/08/18 1158 Oral     SpO2 01/08/18 1158 99 %     Weight 01/08/18 1148 195 lb (88.5 kg)     Height 01/08/18 1148 '5\' 8"'  (1.727 m)     Head Circumference --      Peak Flow --      Pain Score 01/08/18 1148 10     Pain Loc --      Pain Edu? --      Excl. in [place]? --     Constitutional: Alert and oriented. Well appearing and in no acute distress. Eyes: Conjunctivae are normal Head: Atraumatic HEENT: No congestion/rhinnorhea. Mucous membranes are moist.  Oropharynx non-erythematous Neck:   Nontender with no meningismus, no masses, no stridor Cardiovascular: Normal rate, regular rhythm. Grossly normal heart sounds.  Good peripheral circulation. Respiratory: Normal respiratory effort.  No retractions. Lungs CTAB. Abdominal: Soft and nontender. No distention. No guarding no rebound Back:  There is no focal tenderness or step off.  there is no midline tenderness there are no lesions noted. there is no CVA tenderness Musculoskeletal: No lower extremity tenderness, no upper extremity tenderness. No joint effusions, no DVT signs strong distal pulses no edema Neurologic:  Normal speech and language. No gross focal neurologic deficits are appreciated.  Skin:  Skin is warm, dry and  intact. No rash noted. Psychiatric: Mood and affect are volatile and sometimes angry. ____________________________________________   LABS (all labs ordered are listed, but only abnormal results are displayed)  Labs Reviewed  COMPREHENSIVE METABOLIC PANEL - Abnormal; Notable for the following components:      Result Value   Glucose, Bld 152 (*)    All other components within normal limits  ACETAMINOPHEN LEVEL - Abnormal; Notable for the following components:   Acetaminophen (Tylenol), Serum <10 (*)    All other components within normal limits  ETHANOL  SALICYLATE LEVEL  CBC  URINE DRUG SCREEN, QUALITATIVE ([facility] ONLY)    Pertinent labs  results that were available during my care of the patient were reviewed by me and considered in my medical decision making (see chart for details). ____________________________________________  EKG  I personally interpreted any EKGs ordered by me or triage  ____________________________________________  RADIOLOGY  Pertinent labs & imaging results that were available during my care of the patient were reviewed by me and considered in my medical decision making (see chart for details). If possible, patient and/or family made aware of any abnormal findings.  No results found. ____________________________________________    PROCEDURES  Procedure(s) performed: None  Procedures  Critical Care performed: None  ____________________________________________   INITIAL IMPRESSION / ASSESSMENT AND PLAN / ED COURSE  Pertinent labs & imaging results that were available during my care of the patient were reviewed by me and considered in my medical decision making (see chart for details).  Patient here with an attempt to kill himself, a firm commitment to continuing to self harm and when we encouraged him to stay in this department he told us he was going to "kill you mother fuckers" and sue [place].  Patient is in no acute medical distress, he does have  multiple medical problems, he is agitated and upset.  We are giving him some mild sedating medication so he does not hurt himself, [place] or the 5 police officers that are congregated currently around his doors as he yells threats and curse words at them.  ----------------------------------------- 3:17 PM on 01/08/2018 -----------------------------------------  Patient is much more compliant at this time, he has  talked to the psychiatrist, we have asked the pharmacist to update his med list so we can order his home meds.  Signed out at this time to Dr. Alfred Levins   ____________________________________________   FINAL CLINICAL IMPRESSION(S) / ED DIAGNOSES  Final diagnoses:  None      This chart was dictated using voice recognition software.  Despite best efforts to proofread,  errors can occur which can change meaning.       Schuyler Amor, MD 01/08/18 1325    Schuyler Amor, MD 01/08/18 1326    Schuyler Amor, MD 01/08/18 1517    Schuyler Amor, MD 01/09/18 1414

## 2018-01-08 NOTE — Tx Team (Signed)
Initial Treatment Plan 01/08/2018 10:47 PM Woodfin Ganja NHA:[id number]    PATIENT STRESSORS: Financial difficulties Health problems Medication change or noncompliance Substance abuse   PATIENT STRENGTHS: Capable of independent living Communication skills Supportive family/friends   PATIENT IDENTIFIED PROBLEMS: Substance Abuse 01/08/18  Depression 01/08/18  Suicidal thoughts 01/08/18                 DISCHARGE CRITERIA:  Improved stabilization in mood, thinking, and/or behavior Medical problems require only outpatient monitoring Motivation to continue treatment in a less acute level of care Need for constant or close observation no longer present Verbal commitment to aftercare and medication compliance  PRELIMINARY DISCHARGE PLAN: Outpatient therapy Participate in family therapy Return to previous living arrangement  PATIENT/FAMILY INVOLVEMENT: This treatment plan has been presented to and reviewed with the patient, Vincent Hall.  The patient has been given the opportunity to ask questions and make suggestions.  Reyes Ivan, RN 01/08/2018, 10:47 PM

## 2018-01-08 NOTE — ED Notes (Signed)
BEHAVIORAL HEALTH ROUNDING Patient sleeping: No. Patient alert and oriented: yes Behavior appropriate: Yes.  ; If no, describe:  Nutrition and fluids offered: yes Toileting and hygiene offered: Yes  Sitter present: q15 minute observations and security monitoring Law enforcement present: Yes  ODS  Pt standing in the corner of his room - shirt ripped open  - angry  "go get the doctor  - NOW - I am ready to leave - go get me my pain medication - NOW"

## 2018-01-08 NOTE — Progress Notes (Signed)
Patient: Vincent Hall Male    DOB: 04/08/1966   52 y.o.   MRN: [id number] Visit Date: 01/08/2018  Today's Provider: [name], MD   Chief Complaint  Patient presents with  . Pain   Subjective:    HPI Pain: Patient comes in today with his daughter who reports that patient has been without his pain medication for the past 24 hours, and is experiencing withdrawal effects. He has had GI upset, diarrhea, and suicidal ideation.   He had been taking 1-2 Q6 of oxycodone/apap 5/325, but was changed to 10/325 to take 1 QID on 9/11 since he was consisently taking 2 tablets with each dose. He was prescribed a 22 day supply which should have lasted until October 5th, but ran out a few days ago. When asked why he ran out early, be started crying and talking about how meaningless his life is and that everyone would be better off if he was dead.   He is brought in by his daughter today who states he has been taking more alprazolam since running out of oxycodone and that he took a large amount last night. He was extremely lethargic, but remained awake and refused to go to ER for evaluation. He states today that there is no point  In living and that he has made up his made what he is going to do. When asked what he means by that he only responded "You know what I mean"   He was last seen by his psychiatrist Dr. Shea Evans on 12/12/2017 at which time lithium was discontinued and trazodone was increased to 29m at bedtime.        Allergies  Allergen Reactions  . Montelukast Hives    Singular  . Singulair [Montelukast Sodium] Hives  . Atorvastatin Other (See Comments)    Muscle pain, neck stiffness     Current Outpatient Medications:  .  albuterol (PROVENTIL HFA;VENTOLIN HFA) 108 (90 Base) MCG/ACT inhaler, Inhale 2 puffs into the lungs every 6 (six) hours as needed for wheezing or shortness of breath., Disp: 1 Inhaler, Rfl: 3 .  ALPRAZolam (XANAX) 0.5 MG tablet, TAKE 1 TABLET BY MOUTH  TWICE DAILY AS NEEDED FOR ANXIETY, Disp: 60 tablet, Rfl: 5 .  aspirin 81 MG tablet, Take 81 mg by mouth daily., Disp: , Rfl:  .  azelastine (ASTELIN) 0.1 % nasal spray, Place 2 sprays into both nostrils 2 (two) times daily., Disp: 30 mL, Rfl: 6 .  azelastine (OPTIVAR) 0.05 % ophthalmic solution, drop affect eye twice a day, Disp: 6 mL, Rfl: 3 .  Blood Glucose Monitoring Suppl (ONE TOUCH ULTRA SYSTEM KIT) w/Device KIT, 1 kit by Does not apply route once., Disp: 1 each, Rfl: 0 .  dexlansoprazole (DEXILANT) 60 MG capsule, Take 1 capsule (60 mg total) by mouth daily., Disp: 30 capsule, Rfl: 12 .  glipiZIDE (GLUCOTROL XL) 10 MG 24 hr tablet, TAKE 1 TABLET(10 MG) BY MOUTH DAILY, Disp: 30 tablet, Rfl: 3 .  glucose blood (ONE TOUCH ULTRA TEST) test strip, Check blood sugar 3 times daily E11.65 (uncontrolled insulin dependent diabetes), Disp: 100 each, Rfl: 12 .  insulin lispro (HUMALOG KWIKPEN) 100 UNIT/ML KiwkPen, Inject into the skin., Disp: , Rfl:  .  INVOKAMET 150-500 MG TABS, TAKE 1 TABLET BY MOUTH TWICE DAILY, Disp: 60 tablet, Rfl: 5 .  lamoTRIgine (LAMICTAL) 25 MG tablet, TAKE 1 TABLET(25 MG) BY MOUTH DAILY, Disp: 30 tablet, Rfl: 0 .  mometasone (ELOCON) 0.1 % cream,  One application daily as needed for rash, Disp: 45 g, Rfl: 1 .  nitroGLYCERIN (NITROSTAT) 0.4 MG SL tablet, Place under the tongue., Disp: , Rfl:  .  nystatin (MYCOSTATIN) 100000 UNIT/ML suspension, Take 5 mLs (500,000 Units total) by mouth 4 (four) times daily., Disp: 60 mL, Rfl: 0 .  oxyCODONE-acetaminophen (PERCOCET) 10-325 MG tablet, Take 1 tablet by mouth every 6 (six) hours as needed for pain., Disp: 120 tablet, Rfl: 0 .  pioglitazone (ACTOS) 15 MG tablet, Take 1 tablet (15 mg total) by mouth daily., Disp: 30 tablet, Rfl: 4 .  ranitidine (ZANTAC) 150 MG capsule, Take 150 mg by mouth as needed for heartburn., Disp: , Rfl:  .  rosuvastatin (CRESTOR) 40 MG tablet, TAKE 1 TABLET(40 MG) BY MOUTH DAILY, Disp: 30 tablet, Rfl: 11 .   sertraline (ZOLOFT) 100 MG tablet, Take 2 tablets (200 mg total) by mouth daily., Disp: 60 tablet, Rfl: 1 .  traZODone (DESYREL) 100 MG tablet, Take 2 tablets (200 mg total) by mouth at bedtime., Disp: 60 tablet, Rfl: 1  Review of Systems  Constitutional: Negative for appetite change, chills and fever.  Respiratory: Negative for chest tightness, shortness of breath and wheezing.   Cardiovascular: Negative for chest pain and palpitations.  Gastrointestinal: Positive for abdominal pain and diarrhea. Negative for nausea and vomiting.  Musculoskeletal: Positive for arthralgias.  Psychiatric/Behavioral: Positive for suicidal ideas.    Social History   Tobacco Use  . Smoking status: Former Smoker    Packs/day: 0.25    Years: 30.00    Pack years: 7.50    Types: Cigarettes    Last attempt to quit: 08/08/2013    Years since quitting: 4.4  . Smokeless tobacco: Never Used  Substance Use Topics  . Alcohol use: No   Objective:   BP 140/80 (BP Location: Left Arm, Patient Position: Sitting, Cuff Size: Large)   Pulse 61   Temp (!) 97.5 F (36.4 C) (Oral)   Resp 16   Wt 196 lb (88.9 kg)   SpO2 98% Comment: room air  BMI 29.80 kg/m  Vitals:   01/08/18 1014  BP: 140/80  Pulse: 61  Resp: 16  Temp: (!) 97.5 F (36.4 C)  TempSrc: Oral  SpO2: 98%  Weight: 196 lb (88.9 kg)     Physical Exam  General appearance: Awake, very depressed affect, staring at wall. Very slow to respond to any questions, but quickly breaks down in tears when attempting to express him self.  Head: Normocephalic, without obvious abnormality, atraumatic Respiratory: Respirations even and unlabored, normal respiratory rate      Assessment & Plan:     1. Suicidal behavior with attempted self-injury [facility]) Patient repeated states he has made up his made what he is going to do and nobody can stop him from doing it, alluding to attempting to kill himself. He admits that he took what was left in his bottle of  alprazolam last night which was attempt to end his life. Initially his daughter stated that he refused to go ER for psychiatric evaluation. Patient also states he would not let her take him, but would go if someone took him. EMS was called and sent [employer]. Patient then said he would allow daughter to take him to ER immediately.   2. 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 Admits to taking inconsistently as a pain is very dependent on activity levels, but took 90 tablets of oxycodone/apap 10/325 prescribed on 12/19/2017 in about 18 days. (  5-6 per day). Counseled that he needs to take no more than prescribed on the bottle and to notify or follow up with me if that is not controlling pain, however pain is not really willing to talk about anything today except his emotional dispair and desire to end his life.        [name], MD  [facility]

## 2018-01-08 NOTE — ED Notes (Signed)
Report to include Situation, Background, Assessment, and Recommendations received from Jadeka RN. Patient alert and oriented, warm and dry, in no acute distress. Patient denies SI, HI, AVH and pain. Patient made aware of Q15 minute rounds and security cameras for their safety. Patient instructed to come to me with needs or concerns. 

## 2018-01-08 NOTE — ED Notes (Signed)
IVC PENDING  CONSULT ?

## 2018-01-08 NOTE — BH Assessment (Signed)
Assessment Note  Vincent Hall is an 52 y.o. male who presents to the ED from PCP office with active SI. Per Triage RN "Pt reports SI starting last pm. Pt reports took xanax last night 0.5mg  tables approximately 20 of them. Pt daughter reports having these thoughts because he has not had his pain meds in over 24 hours." When talking to this writer pt denies SI and is very remorseful about his actions. Pt states "I was really upset because no understand the pain I have is so bad in my back. I will never do this to my family or come here again. I am really sorry I did this." Pt denies SI/HI A/V H/D at this time.   Diagnosis: Depression  Past Medical History:  Past Medical History:  Diagnosis Date  . Asthma   . Coronary artery disease   . Fatty liver   . History of MI (myocardial infarction)     Past Surgical History:  Procedure Laterality Date  . APPENDECTOMY    . CARDIAC CATHETERIZATION  06/24/2013  . COLONOSCOPY WITH PROPOFOL N/A 09/23/2014   Procedure: COLONOSCOPY WITH PROPOFOL;  Surgeon: Lucilla Lame, MD;  Location: [place];  Service: Endoscopy;  Laterality: N/A;  . CORONARY ANGIOPLASTY     stent placement to LAD  . CORONARY ARTERY BYPASS GRAFT  07/31/2013   x 3 vessels  . FINGER SURGERY    . NASAL SINUS SURGERY    . POLYPECTOMY  09/23/2014   Procedure: POLYPECTOMY;  Surgeon: Lucilla Lame, MD;  Location: [place];  Service: Endoscopy;;    Family History:  Family History  Problem Relation Age of Onset  . Hyperlipidemia Mother   . Heart attack Father   . Hyperlipidemia Father   . Diabetes Father   . Heart disease Father     Social History:  reports that he quit smoking about 4 years ago. His smoking use included cigarettes. He has a 7.50 pack-year smoking history. He has never used smokeless tobacco. He reports that he does not drink alcohol or use drugs.  Additional Social History:  Alcohol / Drug Use Pain Medications: SEE MAR Prescriptions: SEE  MAR Over the Counter: SEE MAR History of alcohol / drug use?: No history of alcohol / drug abuse  CIWA: CIWA-Ar BP: 125/76 Pulse Rate: 66 COWS:    Allergies:  Allergies  Allergen Reactions  . Montelukast Hives    Singular  . Singulair [Montelukast Sodium] Hives  . Atorvastatin Other (See Comments)    Muscle pain, neck stiffness    Home Medications:  (Not in a hospital admission)  OB/GYN Status:  No LMP for male patient.  General Assessment Data Location of Assessment: [facility] ED TTS Assessment: In system Is this a Tele or Face-to-Face Assessment?: Face-to-Face Is this an Initial Assessment or a Re-assessment for this encounter?: Initial Assessment Patient Accompanied by:: N/A Language Other than English: No Living Arrangements: (Home with Family) What gender do you identify as?: Male Marital status: Married Pregnancy Status: No Living Arrangements: Spouse/significant other, Children Can pt return to current living arrangement?: Yes Admission Status: Involuntary Petitioner: Family member Is patient capable of signing voluntary admission?: No Referral Source: Self/Family/Friend Insurance type: Insurance risk surveyor Exam ([place]) Medical Exam completed: Yes  Crisis Care Plan Living Arrangements: Spouse/significant other, Children Legal Guardian: Other:(sELF) Name of Therapist: (Pt doesn't remember Dr's name)  Education Status Is patient currently in school?: No Is the patient employed, unemployed or receiving disability?: Unemployed  Risk to  self with the past 6 months Suicidal Ideation: No Has patient been a risk to self within the past 6 months prior to admission? : No Suicidal Intent: No Has patient had any suicidal intent within the past 6 months prior to admission? : No Is patient at risk for suicide?: No Suicidal Plan?: No Has patient had any suicidal plan within the past 6 months prior to admission? : No Access to Means: No What has been  your use of drugs/alcohol within the last 12 months?: Pt denies use Previous Attempts/Gestures: No How many times?: 0 Other Self Harm Risks: n/a Triggers for Past Attempts: None known Intentional Self Injurious Behavior: None Family Suicide History: No Recent stressful life event(s): Conflict (Comment), Recent negative physical changes(Chronic back pain) Persecutory voices/beliefs?: No Depression: No Depression Symptoms: (denies) Substance abuse history and/or treatment for substance abuse?: No Suicide prevention information given to non-admitted patients: Not applicable  Risk to Others within the past 6 months Homicidal Ideation: No Does patient have any lifetime risk of violence toward others beyond the six months prior to admission? : No Thoughts of Harm to Others: No Current Homicidal Intent: No Current Homicidal Plan: No Access to Homicidal Means: No Identified Victim: n/a History of harm to others?: No Assessment of Violence: None Noted Does patient have access to weapons?: Yes (Comment) Criminal Charges Pending?: No Does patient have a court date: No Is patient on probation?: No  Psychosis Hallucinations: None noted Delusions: None noted  Mental Status Report Appearance/Hygiene: Unremarkable, In scrubs Eye Contact: Fair Motor Activity: Freedom of movement Speech: Logical/coherent Level of Consciousness: Alert Mood: Pleasant Affect: Appropriate to circumstance Anxiety Level: Moderate Thought Processes: Coherent, Relevant Judgement: Unimpaired Orientation: Person, Place, Situation, Time, Appropriate for developmental age Obsessive Compulsive Thoughts/Behaviors: None  Cognitive Functioning Concentration: Good Memory: Recent Intact, Remote Intact Is patient IDD: No Insight: Fair Impulse Control: Poor Appetite: Fair Have you had any weight changes? : No Change Sleep: Decreased Total Hours of Sleep: 5 Vegetative Symptoms: None  ADLScreening [facility] Assessment  Services) Patient's cognitive ability adequate to safely complete daily activities?: Yes Patient able to express need for assistance with ADLs?: Yes Independently performs ADLs?: Yes (appropriate for developmental age)  Prior Inpatient Therapy Prior Inpatient Therapy: No  Prior Outpatient Therapy Prior Outpatient Therapy: Yes Prior Therapy Dates: current Prior Therapy Facilty/Provider(s): [facility] Does patient have an ACCT team?: No Does patient have Intensive In-House Services?  : No Does patient have [facility] services? : No Does patient have P4CC services?: No  ADL Screening (condition at time of admission) Patient's cognitive ability adequate to safely complete daily activities?: Yes Is the patient deaf or have difficulty hearing?: No Does the patient have difficulty seeing, even when wearing glasses/contacts?: No Does the patient have difficulty concentrating, remembering, or making decisions?: No Patient able to express need for assistance with ADLs?: Yes Does the patient have difficulty dressing or bathing?: No Independently performs ADLs?: Yes (appropriate for developmental age) Does the patient have difficulty walking or climbing stairs?: No Weakness of Legs: None Weakness of Arms/Hands: None  Home Assistive Devices/Equipment Home Assistive Devices/Equipment: None  Therapy Consults (therapy consults require a physician order) PT Evaluation Needed: No OT Evalulation Needed: No SLP Evaluation Needed: No Abuse/Neglect Assessment (Assessment to be complete while patient is alone) Abuse/Neglect Assessment Can Be Completed: Yes Physical Abuse: Denies Verbal Abuse: Denies Sexual Abuse: Denies Exploitation of patient/patient's resources: Denies Self-Neglect: Denies Values / Beliefs Cultural Requests During Hospitalization: None Spiritual Requests During Hospitalization: None Consults  Spiritual Care Consult Needed: No Social Work Consult Needed: No             Disposition:  Disposition Initial Assessment Completed for this Encounter: Yes Disposition of Patient: Admit Type of inpatient treatment program: Adult Patient refused recommended treatment: No Mode of transportation if patient is discharged?: Car Patient referred to: Other (Comment)([facility])  On Site Evaluation by:   Reviewed with Physician:    Rance Smithson D Zoella Roberti 01/08/2018 9:30 PM

## 2018-01-08 NOTE — ED Notes (Signed)
Hourly rounding reveals patient sleeping in room. No complaints, stable, in no acute distress. Q15 minute rounds and monitoring via Security Cameras to continue. 

## 2018-01-08 NOTE — ED Notes (Signed)
Pt in the hallway - walking away - encouraged by myself, EDP and security to stay here in the ED  IVC papers pending   Pt verbalizing threats towards police officers   Continue to monitor

## 2018-01-08 NOTE — ED Notes (Signed)
MD Clapacs is currently consulting with him   NAD observed

## 2018-01-08 NOTE — BH Assessment (Addendum)
Patient is to be admitted to [facility] by Dr. Weber Cooks.  Attending Physician will be Dr. Wonda Olds.   Patient has been assigned to room 303, by [facility] Charge Nurse T'Yawn.   ER staff is aware of the admission:  Glenda, ER Secretary    Dr. Gennaro Africa, ER MD   Donneta Romberg, Patient's Nurse   [place], Patient Access.

## 2018-01-08 NOTE — ED Notes (Signed)

## 2018-01-08 NOTE — ED Triage Notes (Signed)
Pt reports SI starting last pm. Pt reports took xanax last night 0.5mg  tables approximately 20 of them. Pt daughter reports having these thoughts because he has not had his pain meds in over 24 hours.

## 2018-01-08 NOTE — Consult Note (Signed)
[facility] Consult   Reason for Consult: Consult for this 52 year old man with multiple medical problems or came into the hospital after overdosing on Xanax Referring Physician: Alfred Hall Patient Identification: Vincent Hall MRN:  [id number] Principal Diagnosis: Severe recurrent major depression without psychotic features [facility]) Diagnosis:   Patient Active Problem List   Diagnosis Date Noted  . Suicide attempt ([place]) [T14.91XA] 01/08/2018  . Benzodiazepine overdose [T42.4X1A] 01/08/2018  . Severe recurrent major depression without psychotic features ([place]) [F33.2] 01/08/2018  . Chronic pain [G89.29] 01/08/2018  . Thrush, oral [B37.0] 12/03/2017  . Current moderate episode of major depressive disorder without prior episode ([place]) [F32.1] 07/26/2017  . Anxiety [F41.9] 10/28/2014  . Arthralgia [M25.50] 10/28/2014  . Chest wall pain [R07.89] 10/28/2014  . Dysphagia [R13.10] 10/28/2014  . Fatigue [R53.83] 10/28/2014  . History of tobacco use [Z87.891] 10/28/2014  . Hyperlipidemia, mixed [E78.2] 10/28/2014  . Insomnia [G47.00] 10/28/2014  . Chronic constipation [K59.09] 09/18/2014  . Esophageal reflux [K21.9] 09/18/2014  . Esophageal spasm [K22.4] 09/18/2014  . Coronary artery disease [I25.10] 07/07/2013  . STEMI (ST elevation myocardial infarction) ([place]) [I21.3] 06/22/2013  . Allergic rhinitis [J30.9] 06/18/2008    Total Time spent with patient: 1 hour  Subjective:   Vincent Hall is a 52 y.o. male patient admitted with "I will tell you what I cannot live like this".  HPI: Patient interviewed chart reviewed.  52 year old man with chronic pain and multiple medical problems.  Came into the hospital after taking an overdose of his alprazolam.  Patient is a little bit hard to follow but evidently he had gone through his pain medication more rapidly than he should have and wound up running out a few days early.  Pharmacy would not refill the prescription early.  So the  patient took 20 Xanax his last night and stated that he wanted to die.  He has been agitated and irritable here in the emergency room.  Shows poor insight and judgment.  Has continued to make suicidal statements here.  Does not appear to be psychotic or responding to internal stimuli.  Patient is tangential and overly focused on his medical problems and it is hard to get him to describe his depression although he does say that his mood is bad.  Patient has an outpatient psychiatrist he sees who has been prescribing medicine for depression and anxiety for him.  Social history: Married.  Apparently does have some support at home.  Not clear to me whether he is still working right now although he seems to suggest that he might be.  Medical history: History of coronary artery disease status post bypass graft.  He complains of chronic pain in his sternum as a result of that.  Also chronic back pain chronic esophageal reflux with gastric reflux symptoms hyperlipidemia  Substance abuse history: Alcohol abuse not listed as being a problem.  Patient says that he has never abused his pain medicine or anxiety medicine in his past.  No history of prior substance abuse documented that I can find.  Past Psychiatric History: Patient evidently has been treated for depression and mood symptoms for years and has had more than one prior suicide attempt and hospitalization.  Has only been seeing Dr. even fairly recently.  In that time has been tried on a couple of mood stabilizers with poor tolerance and is still on an antidepressant and antianxiety medicine.  Risk to Self:   Risk to Others:   Prior Inpatient Therapy:   Prior Outpatient Therapy:  Past Medical History:  Past Medical History:  Diagnosis Date  . Asthma   . Coronary artery disease   . Fatty liver   . History of MI (myocardial infarction)     Past Surgical History:  Procedure Laterality Date  . APPENDECTOMY    . CARDIAC CATHETERIZATION   06/24/2013  . COLONOSCOPY WITH PROPOFOL N/A 09/23/2014   Procedure: COLONOSCOPY WITH PROPOFOL;  Surgeon: Vincent Lame, MD;  Location: [place];  Service: Endoscopy;  Laterality: N/A;  . CORONARY ANGIOPLASTY     stent placement to LAD  . CORONARY ARTERY BYPASS GRAFT  07/31/2013   x 3 vessels  . FINGER SURGERY    . NASAL SINUS SURGERY    . POLYPECTOMY  09/23/2014   Procedure: POLYPECTOMY;  Surgeon: Vincent Lame, MD;  Location: [place];  Service: Endoscopy;;   Family History:  Family History  Problem Relation Age of Onset  . Hyperlipidemia Mother   . Heart attack Father   . Hyperlipidemia Father   . Diabetes Father   . Heart disease Father    Family Psychiatric  History: Family history is noted in the chart as having been positive Social History:  Social History   Substance and Sexual Activity  Alcohol Use No     Social History   Substance and Sexual Activity  Drug Use No    Social History   Socioeconomic History  . Marital status: Married    Spouse name: Vincent Hall  . Number of children: 2  . Years of education: HS Grad  . Highest education level: Associate degree: occupational, Hotel manager, or vocational program  Occupational History  . Occupation: [employer]  Social Needs  . Financial resource strain: Somewhat hard  . Food insecurity:    Worry: Never true    Inability: Never true  . Transportation needs:    Medical: No    Non-medical: No  Tobacco Use  . Smoking status: Former Smoker    Packs/day: 0.25    Years: 30.00    Pack years: 7.50    Types: Cigarettes    Last attempt to quit: 08/08/2013    Years since quitting: 4.4  . Smokeless tobacco: Never Used  Substance and Sexual Activity  . Alcohol use: No  . Drug use: No  . Sexual activity: Yes  Lifestyle  . Physical activity:    Days per week: 5 days    Minutes per session: 60 min  . Stress: Very much  Relationships  . Social connections:    Talks on phone: Not on file    Gets  together: Not on file    Attends religious service: Never    Active member of club or organization: No    Attends meetings of clubs or organizations: Never    Relationship status: Married  Other Topics Concern  . Not on file  Social History Narrative  . Not on file   Additional Social History:    Allergies:   Allergies  Allergen Reactions  . Montelukast Hives    Singular  . Singulair [Montelukast Sodium] Hives  . Atorvastatin Other (See Comments)    Muscle pain, neck stiffness    Labs:  Results for orders placed or performed during the hospital encounter of 01/08/18 (from the past 48 hour(s))  Comprehensive metabolic panel     Status: Abnormal   Collection Time: 01/08/18 11:59 AM  Result Value Ref Range   Sodium 135 135 - 145 mmol/L   Potassium 4.6 3.5 - 5.1 mmol/L  Chloride 101 98 - 111 mmol/L   CO2 24 22 - 32 mmol/L   Glucose, Bld 152 (H) 70 - 99 mg/dL   BUN 12 6 - 20 mg/dL   Creatinine, Ser 0.82 0.61 - 1.24 mg/dL   Calcium 9.4 8.9 - 10.3 mg/dL   Total Protein 7.4 6.5 - 8.1 g/dL   Albumin 4.7 3.5 - 5.0 g/dL   AST 21 15 - 41 U/L   ALT 21 0 - 44 U/L   Alkaline Phosphatase 41 38 - 126 U/L   Total Bilirubin 0.8 0.3 - 1.2 mg/dL   GFR calc non Af Amer >60 >60 mL/min   GFR calc Af Amer >60 >60 mL/min    Comment: (NOTE) The eGFR has been calculated using the CKD EPI equation. This calculation has not been validated in all clinical situations. eGFR's persistently <60 mL/min signify possible Chronic Kidney Disease.    Anion gap 10 5 - 15    Comment: Performed at [facility], [address] [place], [place] [address]  Ethanol     Status: None   Collection Time: 01/08/18 11:59 AM  Result Value Ref Range   Alcohol, Ethyl (B) <10 <10 mg/dL    Comment: (NOTE) Lowest detectable limit for serum alcohol is 10 mg/dL. For medical purposes only. Performed at [facility], [address] [place], [place] [address]   Salicylate level     Status: None    Collection Time: 01/08/18 11:59 AM  Result Value Ref Range   Salicylate Lvl <7.2 2.8 - 30.0 mg/dL    Comment: Performed at [facility], [address] [place], [place] [address]  Acetaminophen level     Status: Abnormal   Collection Time: 01/08/18 11:59 AM  Result Value Ref Range   Acetaminophen (Tylenol), Serum <10 (L) 10 - 30 ug/mL    Comment: (NOTE) Therapeutic concentrations vary significantly. A range of 10-30 ug/mL  may be an effective concentration for many patients. However, some  are best treated at concentrations outside of this range. Acetaminophen concentrations >150 ug/mL at 4 hours after ingestion  and >50 ug/mL at 12 hours after ingestion are often associated with  toxic reactions. Performed at [facility], [address] [place], [place] [address]   cbc     Status: None   Collection Time: 01/08/18 11:59 AM  Result Value Ref Range   WBC 8.2 3.8 - 10.6 K/uL   RBC 5.33 4.40 - 5.90 MIL/uL   Hemoglobin 15.0 13.0 - 18.0 g/dL   HCT 44.2 40.0 - 52.0 %   MCV 83.0 80.0 - 100.0 fL   MCH 28.2 26.0 - 34.0 pg   MCHC 33.9 32.0 - 36.0 g/dL   RDW 14.0 11.5 - 14.5 %   Platelets 216 150 - 440 K/uL    Comment: Performed at [facility], [address] [place],  [address]  Urine Drug Screen, Qualitative     Status: Abnormal   Collection Time: 01/08/18 11:59 AM  Result Value Ref Range   Tricyclic, Ur Screen NONE DETECTED NONE DETECTED   Amphetamines, Ur Screen NONE DETECTED NONE DETECTED   MDMA (Ecstasy)Ur Screen NONE DETECTED NONE DETECTED   Cocaine Metabolite,Ur [place] NONE DETECTED NONE DETECTED   Opiate, Ur Screen NONE DETECTED NONE DETECTED   Phencyclidine (PCP) Ur S NONE DETECTED NONE DETECTED   Cannabinoid 50 Ng, Ur [place] NONE DETECTED NONE DETECTED   Barbiturates, Ur Screen NONE DETECTED NONE DETECTED   Benzodiazepine, Ur Scrn  POSITIVE (A) NONE DETECTED   Methadone Scn, Ur NONE DETECTED NONE DETECTED    Comment: (NOTE) Tricyclics +  metabolites, urine    Cutoff 1000 ng/mL Amphetamines + metabolites, urine  Cutoff 1000 ng/mL MDMA (Ecstasy), urine              Cutoff 500 ng/mL Cocaine Metabolite, urine          Cutoff 300 ng/mL Opiate + metabolites, urine        Cutoff 300 ng/mL Phencyclidine (PCP), urine         Cutoff 25 ng/mL Cannabinoid, urine                 Cutoff 50 ng/mL Barbiturates + metabolites, urine  Cutoff 200 ng/mL Benzodiazepine, urine              Cutoff 200 ng/mL Methadone, urine                   Cutoff 300 ng/mL The urine drug screen provides only a preliminary, unconfirmed analytical test result and should not be used for non-medical purposes. Clinical consideration and professional judgment should be applied to any positive drug screen result due to possible interfering substances. A more specific alternate chemical method must be used in order to obtain a confirmed analytical result. Gas chromatography / mass spectrometry (GC/MS) is the preferred confirmat ory method. Performed at [facility], [address] [place], [place] [address]     Current Facility-Administered Medications  Medication Dose Route Frequency Provider Last Rate Last Dose  . famotidine (PEPCID) tablet 40 mg  40 mg Oral Once , Madie Reno, MD       Current Outpatient Medications  Medication Sig Dispense Refill  . albuterol (PROVENTIL HFA;VENTOLIN HFA) 108 (90 Base) MCG/ACT inhaler Inhale 2 puffs into the lungs every 6 (six) hours as needed for wheezing or shortness of breath. 1 Inhaler 3  . ALPRAZolam (XANAX) 0.5 MG tablet TAKE 1 TABLET BY MOUTH TWICE DAILY AS NEEDED FOR ANXIETY 60 tablet 5  . aspirin 81 MG tablet Take 81 mg by mouth daily.    Marland Kitchen azelastine (ASTELIN) 0.1 % nasal spray Place 2 sprays into both nostrils 2 (two) times daily. 30 mL 6  . azelastine (OPTIVAR) 0.05 % ophthalmic solution drop affect eye twice a day 6 mL 3  . Blood Glucose Monitoring Suppl (ONE TOUCH ULTRA SYSTEM KIT) w/Device KIT 1  kit by Does not apply route once. 1 each 0  . dexlansoprazole (DEXILANT) 60 MG capsule Take 1 capsule (60 mg total) by mouth daily. 30 capsule 12  . divalproex (DEPAKOTE) 250 MG DR tablet Take 250 mg by mouth 3 (three) times daily.    Marland Kitchen glipiZIDE (GLUCOTROL XL) 5 MG 24 hr tablet Take 5 mg by mouth daily with breakfast.    . glucose blood (ONE TOUCH ULTRA TEST) test strip Check blood sugar 3 times daily E11.65 (uncontrolled insulin dependent diabetes) 100 each 12  . INVOKAMET 150-500 MG TABS TAKE 1 TABLET BY MOUTH TWICE DAILY 60 tablet 5  . lamoTRIgine (LAMICTAL) 25 MG tablet TAKE 1 TABLET(25 MG) BY MOUTH DAILY 30 tablet 0  . mometasone (ELOCON) 0.1 % cream One application daily as needed for rash 45 g 1  . nitroGLYCERIN (NITROSTAT) 0.4 MG SL tablet Place 0.4 mg under the tongue every 5 (five) minutes as needed for chest pain.     Marland Kitchen nystatin (MYCOSTATIN) 100000 UNIT/ML suspension Take 5 mLs (500,000 Units total) by mouth 4 (  four) times daily. 60 mL 0  . oxyCODONE-acetaminophen (PERCOCET) 10-325 MG tablet Take 1 tablet by mouth every 6 (six) hours as needed for pain. 120 tablet 0  . pioglitazone (ACTOS) 15 MG tablet Take 1 tablet (15 mg total) by mouth daily. 30 tablet 4  . RABEprazole (ACIPHEX) 20 MG tablet Take 20 mg by mouth daily.    . ranitidine (ZANTAC) 150 MG capsule Take 150 mg by mouth as needed for heartburn.    . rosuvastatin (CRESTOR) 40 MG tablet TAKE 1 TABLET(40 MG) BY MOUTH DAILY 30 tablet 11  . sertraline (ZOLOFT) 100 MG tablet Take 2 tablets (200 mg total) by mouth daily. 60 tablet 1  . traZODone (DESYREL) 100 MG tablet Take 2 tablets (200 mg total) by mouth at bedtime. 60 tablet 1  . glipiZIDE (GLUCOTROL XL) 10 MG 24 hr tablet TAKE 1 TABLET(10 MG) BY MOUTH DAILY (Patient not taking: Reported on 01/08/2018) 30 tablet 3    Musculoskeletal: Strength & Muscle Tone: within normal limits Gait & Station: normal Patient leans: N/A  Psychiatric Specialty Exam: Physical Exam  Nursing  note and vitals reviewed. Constitutional: He appears well-developed and well-nourished.  HENT:  Head: Normocephalic and atraumatic.  Eyes: Pupils are equal, round, and reactive to light. Conjunctivae are normal.  Neck: Normal range of motion.  Cardiovascular: Regular rhythm and normal heart sounds.  Respiratory: Effort normal. No respiratory distress.  GI: Soft.  Musculoskeletal: Normal range of motion.  Neurological: He is alert.  Skin: Skin is warm and dry.  Psychiatric: His mood appears anxious. His affect is labile. His speech is tangential. He is agitated and slowed. He expresses impulsivity. He exhibits a depressed mood. He expresses suicidal ideation. He expresses suicidal plans. He exhibits abnormal recent memory.    Review of Systems  Constitutional: Negative.   HENT: Negative.   Eyes: Negative.   Respiratory: Negative.   Cardiovascular: Negative.   Gastrointestinal: Positive for heartburn and nausea.  Musculoskeletal: Positive for back pain, joint pain and myalgias.  Skin: Negative.   Neurological: Negative.   Psychiatric/Behavioral: Positive for depression, memory loss, substance abuse and suicidal ideas. Negative for hallucinations. The patient is nervous/anxious and has insomnia.     Blood pressure 125/76, pulse 66, temperature 97.9 F (36.6 C), temperature source Oral, resp. rate 16, height 5' 8" (1.727 m), weight 88.5 kg, SpO2 99 %.Body mass index is 29.65 kg/m.  General Appearance: Disheveled  Eye Contact:  Fair  Speech:  Garbled  Volume:  Decreased  Mood:  Depressed, Dysphoric and Irritable  Affect:  Congruent  Thought Process:  Disorganized  Orientation:  Full (Time, Place, and Person)  Thought Content:  Illogical, Rumination and Tangential  Suicidal Thoughts:  Yes.  with intent/plan  Homicidal Thoughts:  No  Memory:  Immediate;   Fair Recent;   Poor Remote;   Fair  Judgement:  Intact  Insight:  Shallow  Psychomotor Activity:  Decreased  Concentration:   Concentration: Poor  Recall:  Poor  Fund of Knowledge:  Fair  Language:  Fair  Akathisia:  No  Handed:  Right  AIMS (if indicated):     Assets:  Desire for Improvement Housing  ADL's:  Impaired  Cognition:  Impaired,  Mild  Sleep:        Treatment Plan Summary: Daily contact with patient to assess and evaluate symptoms and progress in treatment, Medication management and Plan Patient remains to labile agitated and continues to endorse suicidal ideation.  Cannot be considered for discharge at  this time.  Plan will be to admit to the psychiatric unit because of suicide attempt and ongoing symptoms of severe depression.  I will try to put him back on his medicine as he usually takes it to prevent any withdrawal.  15-minute checks in place labs all reviewed.  Disposition: Recommend psychiatric Inpatient admission when medically cleared. Supportive therapy provided about ongoing stressors.  Alethia Berthold, MD 01/08/2018 4:12 PM

## 2018-01-08 NOTE — ED Triage Notes (Signed)
Daughter with patient, reports that she as able to get most of the xanax out of his mouth but not all of it. Pt daughter reports that his MD changed the dose of his oxycodone before the month was up so he only got 22 tablets and when he went to the pharmacy they would not fill it because it was a day early. Pt states " if they are not going to give me any fucking pain medication I am just going to leave".

## 2018-01-08 NOTE — BH Assessment (Signed)
Writer is unable to complete TTS Consult at this time due to patient, is too agitated.

## 2018-01-08 NOTE — ED Notes (Signed)
IVC/ Consult completed/ Plan to Admit to BMU  

## 2018-01-08 NOTE — ED Notes (Addendum)
Sandals, underwear, jean shorts, t-shirt, wallet  placed in belongings bag and given to daughter per family request to take home

## 2018-01-08 NOTE — ED Notes (Signed)
In the course of reconciling patient's PTA medications, I contacted pharmacy to verify last dispenses on a few medications. Pharmacy staff reported that patient had recently received a 22 day supply (90 tablets) of his OXYCODONE/APAP on 12/21/2017 and had brought another prescription for same to pharmacy on 01/07/2018 claiming he was out of medication. Pharmacy declined to fill the prescription. Patient, reportedly, became argumentative and confrontational with store management. Pharmacy staff reports "propencity" for narcotics.   ** The above is intended solely for informational and/or communicative purposes. It should in no way be considered an endorsement of any specific treatment, therapy or action. **

## 2018-01-08 NOTE — ED Notes (Signed)
TTS in to see patient.

## 2018-01-08 NOTE — Progress Notes (Signed)
Precautionary checks every 15 minutes for safety maintained, room free of safety hazards, patient sustains no injury or falls during this shift. Will endorse care to next shift. Patient pain being managed with provider orders. Pt. Reported some indigestion, prn medications given with good effect.

## 2018-01-08 NOTE — Progress Notes (Signed)
D: Received patient from [facility] Emergency Department. Patient skin assessment completed with Cristie Hem, RN, skin is intact, no contraband found, but did have mid-sternal surgical scar and two laparoscopic small incisional scars. Pt. Was admitted under the services of, Dr. Wonda Olds. Pt. Upon arrival to the unit very drowsy from medications given in the emergency room, but was able to be complaint with the admissions process. Pt. During our interviewing when discussing why he is here and gathering more information severely minimizes why he is here. Pt. Denies ever trying to kill himself and denies any suicide attempt in his history at all. Pt. When asked about taking several xanax in an attempt to kill himself or reporting wanted to die, denies all of these reports. Pt. Only reports that his doctor gave him the wrong number of pills to take from the pharmacy and so by doing the math he ran out of medication early like he states he expected, because he did not receive the correct amount when taking the prescribed amount by his doctor reportedly. When he went to the pharmacy to pick up his medications early, because he ran out, the pharmacy refused to give him his medications early. Pt. Does report vaguely taking "some" medications to help with his pain after pharmacy refused him, but no additional details. Pt. When asked why he is here states, "I'm here voluntarily, because my family cares about me and wanted me to get help". Pt. Does not further elaborate or give significant information besides described above about why he is here. Pt. Insight and judgement poor. Pt. Reportedly anxious about being here. Currently denies SI/HI/AVH, contracts for safety.   A: Patient after admissions is oriented to unit/room/call light and goes to bed. Patient was encourage to participate in unit activities and continue with plan of care. Q x 15 minute observation checks were initiated for safety. Pt. Given something  to drink and offered meal, but declined meal.  R: Patient is mostly receptive to treatment plan we created with his input, but minimizes much of the outlined problems.

## 2018-01-08 NOTE — ED Notes (Signed)
Patient complained of 9/10 lower back and sternum pain. Gave patient 2 oxycodone-acet tablets 5-325mg , explained to patient how often he can get the medication (q6hrs) and if he receives one or two tablets its at the discretion of the nurse and MD. Patient said ok

## 2018-01-08 NOTE — Plan of Care (Signed)
Patient recently admitted this evening and has not had an adequate amount of time to be evaluated for progressions.    Problem: Education: Goal: Knowledge of [place] [facility] Education information/materials will improve Outcome: Not Progressing Goal: Emotional status will improve Outcome: Not Progressing Goal: Mental status will improve Outcome: Not Progressing Goal: Verbalization of understanding the information provided will improve Outcome: Not Progressing   Problem: Activity: Goal: Interest or engagement in activities will improve Outcome: Not Progressing Goal: Sleeping patterns will improve Outcome: Not Progressing   Problem: Coping: Goal: Ability to verbalize frustrations and anger appropriately will improve Outcome: Not Progressing Goal: Ability to demonstrate self-control will improve Outcome: Not Progressing   Problem: Health Behavior/Discharge Planning: Goal: Identification of resources available to assist in meeting health care needs will improve Outcome: Not Progressing Goal: Compliance with treatment plan for underlying cause of condition will improve Outcome: Not Progressing   Problem: Physical Regulation: Goal: Ability to maintain clinical measurements within normal limits will improve Outcome: Not Progressing   Problem: Safety: Goal: Periods of time without injury will increase Outcome: Not Progressing   Problem: Education: Goal: Utilization of techniques to improve thought processes will improve Outcome: Not Progressing Goal: Knowledge of the prescribed therapeutic regimen will improve Outcome: Not Progressing   Problem: Activity: Goal: Interest or engagement in leisure activities will improve Outcome: Not Progressing Goal: Imbalance in normal sleep/wake cycle will improve Outcome: Not Progressing   Problem: Coping: Goal: Coping ability will improve Outcome: Not Progressing Goal: Will verbalize feelings Outcome: Not Progressing   Problem:  Health Behavior/Discharge Planning: Goal: Ability to make decisions will improve Outcome: Not Progressing Goal: Compliance with therapeutic regimen will improve Outcome: Not Progressing   Problem: Safety: Goal: Ability to disclose and discuss suicidal ideas will improve Outcome: Not Progressing Goal: Ability to identify and utilize support systems that promote safety will improve Outcome: Not Progressing

## 2018-01-08 NOTE — ED Notes (Signed)
Hourly rounding reveals patient in room. No complaints, stable, in no acute distress. Q15 minute rounds and monitoring via Security Cameras to continue. 

## 2018-01-08 NOTE — ED Notes (Signed)
Snack and beverage given. 

## 2018-01-08 NOTE — ED Notes (Signed)
BEHAVIORAL HEALTH ROUNDING Patient sleeping: Yes.   Patient alert and oriented: eyes closed  Appears to be asleep Behavior appropriate: Yes.  ; If no, describe:  Nutrition and fluids offered: Yes  Toileting and hygiene offered: sleeping Sitter present: q 15 minute observations and security monitoring Law enforcement present: yes  ODS 

## 2018-01-08 NOTE — ED Notes (Signed)
Pt reports  "I was having trouble getting my pain medicine refilled yesterday - I called the pharmacy and that [place] woman would not fill my prescription because she said the insurance would not pay- I called the doctor and they said that they [facility] - I had to call the general manager of that [facility] store so that they would fill my medicine - and then the manager called me back and apologized and told me to come pick up my medicine - I went and they still did fill my script  - I went home and took a whole bunch of xanax pills  - I will not stop until I hurt no more - I took all those pills and I will do it again - especially if I don't get my pain pills - I will not stop - I don't care if I go to hell"   Pt reports that he took 5 xanax this am along with his regular daily meds   Continue to monitor

## 2018-01-09 DIAGNOSIS — F332 Major depressive disorder, recurrent severe without psychotic features: Principal | ICD-10-CM

## 2018-01-09 LAB — TSH: TSH: 0.58 u[IU]/mL (ref 0.350–4.500)

## 2018-01-09 LAB — LIPID PANEL
Cholesterol: 132 mg/dL (ref 0–200)
HDL: 49 mg/dL (ref >40)
LDL Cholesterol: 61 mg/dL (ref 0–99)
Total CHOL/HDL Ratio: 2.7 RATIO
Triglycerides: 111 mg/dL (ref <150)
VLDL: 22 mg/dL (ref 0–40)

## 2018-01-09 MED ORDER — LAMOTRIGINE 25 MG PO TABS
25.0000 mg | ORAL_TABLET | Freq: Every day | ORAL | Status: DC
  Administered 2018-01-09 – 2018-01-10 (×2): 25 mg via ORAL
  Filled 2018-01-09 (×2): qty 1

## 2018-01-09 MED ORDER — GABAPENTIN 600 MG PO TABS: 600.0000 mg | ORAL_TABLET | Freq: Three times a day (TID) | ORAL | Status: DC

## 2018-01-09 MED ORDER — SERTRALINE HCL 100 MG PO TABS
200.0000 mg | ORAL_TABLET | Freq: Every day | ORAL | Status: DC
  Administered 2018-01-10: 200 mg via ORAL
  Filled 2018-01-09: qty 2

## 2018-01-09 MED ORDER — SERTRALINE HCL 100 MG PO TABS
100.0000 mg | ORAL_TABLET | Freq: Once | ORAL | Status: AC
  Administered 2018-01-09: 100 mg via ORAL
  Filled 2018-01-09: qty 1

## 2018-01-09 NOTE — H&P (Signed)
Psychiatric Admission Assessment Adult  Patient Identification: Triston Skare MRN:  [id number] Date of Evaluation:  01/09/2018 Chief Complaint:  Overdose on Xanax Principal Diagnosis: Severe recurrent major depression without psychotic features [place]) Diagnosis:   Patient Active Problem List   Diagnosis Date Noted  . Severe recurrent major depression without psychotic features ([place]) [F33.2] 01/08/2018    Priority: High  . Suicide attempt ([place]) [T14.91XA] 01/08/2018  . Benzodiazepine overdose [T42.4X1A] 01/08/2018  . Chronic pain [G89.29] 01/08/2018  . Thrush, oral [B37.0] 12/03/2017  . Current moderate episode of major depressive disorder without prior episode ([place]) [F32.1] 07/26/2017  . Anxiety [F41.9] 10/28/2014  . Arthralgia [M25.50] 10/28/2014  . Chest wall pain [R07.89] 10/28/2014  . Dysphagia [R13.10] 10/28/2014  . Fatigue [R53.83] 10/28/2014  . History of tobacco use [Z87.891] 10/28/2014  . Hyperlipidemia, mixed [E78.2] 10/28/2014  . Insomnia [G47.00] 10/28/2014  . Chronic constipation [K59.09] 09/18/2014  . Esophageal reflux [K21.9] 09/18/2014  . Esophageal spasm [K22.4] 09/18/2014  . Coronary artery disease [I25.10] 07/07/2013  . STEMI (ST elevation myocardial infarction) ([place]) [I21.3] 06/22/2013  . Allergic rhinitis [J30.9] 06/18/2008   History of Present Illness: 52 yo male admitted after an attempted overdose on Xanax as a suicide attempt. Pt was upset about not getting his pain medication filled from the pharmacy. He went home and took a handful of Xanax because of this. He refused to go to the hospital at this time. The next day, He saw his PCP, Dr. Caryn Section yesterday. He voiced thoughts of suicide and started crying. His PCP had him come to the ED for evaluation. He was very demanding in the ED demanding narcotics.   Upon evaluation today, he is much calmer, very nice and pleasant in demeanor. He started crying stating that he feels very guilty for what had happened  yesterday and states that her overreacted. He states that he has chronic pain from a CABG in the past. He states that his PCP only sent 90 pills when he was supposed to have 120 pills so he ran out early. He worked it out with the pharmacy and his insurance and thought he would be able to get a refill. When he went to the pharmacy, they refused to fill it. HE states, "I got really upset." He states that he went home and did "something stupid on my part." He states that he put at least 15 tablets of Xanax in his mouth "to get rid of the pain." He states that he is not sure if he was trying to kill himself at that moment. He states that he was able to spit most of them out. He states that he ended up swallowing probably 2-3 pills. His daughter was present when this happened. He then fell asleep and saw his PCP the next day.  He adamantly denies SI or thoughts of self harm today. He is tearful and states, "I have beautiful mom and dad, a wife and kids." he states they are really what he lives for and is very proud of them. He states that he also reads the bible and does bible study at home. He states, "if you kill yourself then you go to [place]. I don't want that." He states that this attempt was completely impulsive because he was in physical pain. He states that he sees Dr. Shea Evans for medications and last saw her on 9/4. He has been compliant with his medications. He states that he has been off Xanax for several months but had bottle left over.  He also started seeing Alden Hipp for therapy and had one session with her. He states that he has appointments with both Dr. Shea Evans and Merleen Nicely on Friday.  Pt states that he is very glad to be alive and regrets everything that happened.   He reports history of depression and anger. He states that he feels guilty that he has not been able to work due to pain and his wife has to bring in all the income. He denies consistently feeling depressed. HE is hoping to get on disability  which will help the fiances. Pt brightens up significant when talking about his children.   Associated Signs/Symptoms: Depression Symptoms:  depressed mood, (Hypo) Manic Symptoms:  Irritable Mood, Anxiety Symptoms:  Excessive Worry, Psychotic Symptoms:  Denies PTSD Symptoms: Negative Total Time spent with patient: 1 hour  Past Psychiatric History: History of MDD and GAD. He sees Dr. Shea Evans for medication management and last saw her on 9/4. He started seeing Alden Hipp with [facility] for therapy. Past medication trials include Xanax, Zoloft, Ambien and Depakote. He denies past inpatient admissions. Denies past suicide attempts.   Is the patient at risk to self? No.  Has the patient been a risk to self in the past 6 months? Yes.    Has the patient been a risk to self within the distant past? No.  Is the patient a risk to others? No.  Has the patient been a risk to others in the past 6 months? No.  Has the patient been a risk to others within the distant past? No.   Alcohol Screening: 1. How often do you have a drink containing alcohol?: Never 2. How many drinks containing alcohol do you have on a typical day when you are drinking?: 1 or 2 3. How often do you have six or more drinks on one occasion?: Never AUDIT-C Score: 0 4. How often during the last year have you found that you were not able to stop drinking once you had started?: Never 5. How often during the last year have you failed to do what was normally expected from you becasue of drinking?: Never 6. How often during the last year have you needed a first drink in the morning to get yourself going after a heavy drinking session?: Never 7. How often during the last year have you had a feeling of guilt of remorse after drinking?: Never 8. How often during the last year have you been unable to remember what happened the night before because you had been drinking?: Never 9. Have you or someone else been injured as a result of your  drinking?: No 10. Has a relative or friend or a doctor or another health worker been concerned about your drinking or suggested you cut down?: No Alcohol Use Disorder Identification Test Final Score (AUDIT): 0 Intervention/Follow-up: AUDIT Score <7 follow-up not indicated Substance Abuse History in the last 12 months:  No., possibly taking more opiates than prescribed Consequences of Substance Abuse: Negative Previous Psychotropic Medications: Yes  Psychological Evaluations: Yes  Past Medical History:  Past Medical History:  Diagnosis Date  . Asthma   . Coronary artery disease   . Fatty liver   . History of MI (myocardial infarction)     Past Surgical History:  Procedure Laterality Date  . APPENDECTOMY    . CARDIAC CATHETERIZATION  06/24/2013  . COLONOSCOPY WITH PROPOFOL N/A 09/23/2014   Procedure: COLONOSCOPY WITH PROPOFOL;  Surgeon: Lucilla Lame, MD;  Location: [facility];  Service:  Endoscopy;  Laterality: N/A;  . CORONARY ANGIOPLASTY     stent placement to LAD  . CORONARY ARTERY BYPASS GRAFT  07/31/2013   x 3 vessels  . FINGER SURGERY    . NASAL SINUS SURGERY    . POLYPECTOMY  09/23/2014   Procedure: POLYPECTOMY;  Surgeon: Lucilla Lame, MD;  Location: [facility];  Service: Endoscopy;;   Family History:  Family History  Problem Relation Age of Onset  . Hyperlipidemia Mother   . Heart attack Father   . Hyperlipidemia Father   . Diabetes Father   . Heart disease Father    Family Psychiatric  History: Denies Tobacco Screening: Have you used any form of tobacco in the last 30 days? (Cigarettes, Smokeless Tobacco, Cigars, and/or Pipes): No Social History: He was born in [place]. He moved to [place] when he was 11. He met his wife in 54 in [place]. He did not graduate high school but states that him and his wife worked really hard to get his diploma later. 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 He is not working currently due to recent CABG. He worked  hard labor in the past. He is working on getting disability. He currently lives in [place] with his wife. They have 2 children age 46 and 85. His son is a Engineer, structural and his daughter is a Marine scientist.   Additional Social History: Number of Years Married: 40 What types of issues is patient dealing with in the relationship?: "My life is extremely supportive, so that makes me feel bad. And, I can't perform."  Additional relationship information: N/A Are you sexually active?: Yes What is your sexual orientation?: Heterosexual Has your sexual activity been affected by drugs, alcohol, medication, or emotional stress?: "I can't perform."  Does patient have children?: Yes How many children?: 2 How is patient's relationship with their children?: "Great."                          Allergies:   Allergies  Allergen Reactions  . Montelukast Hives    Singular  . Singulair [Montelukast Sodium] Hives  . Atorvastatin Other (See Comments)    Muscle pain, neck stiffness   Lab Results:  Results for orders placed or performed during the hospital encounter of 01/08/18 (from the past 48 hour(s))  Lipid panel     Status: None   Collection Time: 01/08/18 11:59 AM  Result Value Ref Range   Cholesterol 132 0 - 200 mg/dL   Triglycerides 111 <150 mg/dL   HDL 49 >40 mg/dL   Total CHOL/HDL Ratio 2.7 RATIO   VLDL 22 0 - 40 mg/dL   LDL Cholesterol 61 0 - 99 mg/dL    Comment:        Total Cholesterol/HDL:CHD Risk Coronary Heart Disease Risk Table                     Men   Women  1/2 Average Risk   3.4   3.3  Average Risk       5.0   4.4  2 X Average Risk   9.6   7.1  3 X Average Risk  23.4   11.0        Use the calculated Patient Ratio above and the CHD Risk Table to determine the patient's CHD Risk.        ATP III CLASSIFICATION (LDL):  <100  mg/dL   Optimal  100-129  mg/dL   Near or Above                    Optimal  130-159  mg/dL   Borderline  160-189  mg/dL   High  >190     mg/dL    Very High Performed at [facility], [address] [place], [place] [address]   TSH     Status: None   Collection Time: 01/08/18 11:59 AM  Result Value Ref Range   TSH 0.580 0.350 - 4.500 uIU/mL    Comment: Performed by a 3rd Generation assay with a functional sensitivity of <=0.01 uIU/mL. Performed at [facility], [address] [place], [place] [address]     Blood Alcohol level:  Lab Results  Component Value Date   [facility] <10 00/93/8182    Metabolic Disorder Labs:  Lab Results  Component Value Date   HGBA1C 8.3 (A) 11/30/2017   No results found for: PROLACTIN Lab Results  Component Value Date   CHOL 132 01/08/2018   TRIG 111 01/08/2018   HDL 49 01/08/2018   CHOLHDL 2.7 01/08/2018   VLDL 22 01/08/2018   LDLCALC 61 01/08/2018   LDLCALC 79 07/26/2017    Current Medications: Current Facility-Administered Medications  Medication Dose Route Frequency Provider Last Rate Last Dose  . acetaminophen (TYLENOL) tablet 650 mg  650 mg Oral Q6H PRN Clapacs, John T, MD      . albuterol (PROVENTIL HFA;VENTOLIN HFA) 108 (90 Base) MCG/ACT inhaler 2 puff  2 puff Inhalation Q6H PRN Clapacs, John T, MD      . alum & mag hydroxide-simeth (MAALOX/MYLANTA) 200-200-20 MG/5ML suspension 30 mL  30 mL Oral Q4H PRN Clapacs, Madie Reno, MD   30 mL at 01/09/18 0404  . aspirin EC tablet 81 mg  81 mg Oral Daily Clapacs, Madie Reno, MD   81 mg at 01/09/18 0857  . azelastine (ASTELIN) 0.1 % nasal spray 2 spray  2 spray Each Nare BID Clapacs, Madie Reno, MD   2 spray at 01/09/18 0858  . glipiZIDE (GLUCOTROL XL) 24 hr tablet 5 mg  5 mg Oral Q breakfast Clapacs, John T, MD   5 mg at 01/09/18 0857  . hydrOXYzine (ATARAX/VISTARIL) tablet 50 mg  50 mg Oral TID PRN Clapacs, Madie Reno, MD   50 mg at 01/09/18 0901  . lamoTRIgine (LAMICTAL) tablet 25 mg  25 mg Oral Daily Tosha Belgarde, Tyson Babinski, MD   25 mg at 01/09/18 1026  . magnesium hydroxide (MILK OF MAGNESIA) suspension 30 mL  30 mL Oral Daily PRN Clapacs, John  T, MD      . oxyCODONE-acetaminophen (PERCOCET/ROXICET) 5-325 MG per tablet 1-2 tablet  1-2 tablet Oral Q6H PRN Clapacs, Madie Reno, MD   2 tablet at 01/09/18 1026  . pioglitazone (ACTOS) tablet 15 mg  15 mg Oral Daily Clapacs, Madie Reno, MD   15 mg at 01/09/18 0857  . rosuvastatin (CRESTOR) tablet 40 mg  40 mg Oral q1800 Clapacs, John T, MD      . Derrill Memo ON 01/10/2018] sertraline (ZOLOFT) tablet 200 mg  200 mg Oral Daily Belinda Bringhurst R, MD      . traZODone (DESYREL) tablet 100 mg  100 mg Oral QHS Clapacs, John T, MD       PTA Medications: Medications Prior to Admission  Medication Sig Dispense Refill Last Dose  . albuterol (PROVENTIL HFA;VENTOLIN HFA) 108 (90 Base) MCG/ACT inhaler Inhale 2  puffs into the lungs every 6 (six) hours as needed for wheezing or shortness of breath. 1 Inhaler 3 Unknown at PRN  . ALPRAZolam (XANAX) 0.5 MG tablet TAKE 1 TABLET BY MOUTH TWICE DAILY AS NEEDED FOR ANXIETY 60 tablet 5 Unknown at PRN  . aspirin 81 MG tablet Take 81 mg by mouth daily.   Past Week at Unknown time  . azelastine (ASTELIN) 0.1 % nasal spray Place 2 sprays into both nostrils 2 (two) times daily. 30 mL 6 Unknown at PRN  . azelastine (OPTIVAR) 0.05 % ophthalmic solution drop affect eye twice a day 6 mL 3 Unknown at PRN  . Blood Glucose Monitoring Suppl (ONE TOUCH ULTRA SYSTEM KIT) w/Device KIT 1 kit by Does not apply route once. 1 each 0 Taking  . dexlansoprazole (DEXILANT) 60 MG capsule Take 1 capsule (60 mg total) by mouth daily. 30 capsule 12 Past Week at Unknown time  . divalproex (DEPAKOTE) 250 MG DR tablet Take 250 mg by mouth 3 (three) times daily.   Unknown at Unknown  . glipiZIDE (GLUCOTROL XL) 10 MG 24 hr tablet TAKE 1 TABLET(10 MG) BY MOUTH DAILY (Patient not taking: Reported on 01/08/2018) 30 tablet 3 Not Taking at Unknown time  . glipiZIDE (GLUCOTROL XL) 5 MG 24 hr tablet Take 5 mg by mouth daily with breakfast.   Unknown at Unknown  . glucose blood (ONE TOUCH ULTRA TEST) test strip Check blood  sugar 3 times daily E11.65 (uncontrolled insulin dependent diabetes) 100 each 12 Taking  . INVOKAMET 150-500 MG TABS TAKE 1 TABLET BY MOUTH TWICE DAILY 60 tablet 5 Past Week at Unknown time  . lamoTRIgine (LAMICTAL) 25 MG tablet TAKE 1 TABLET(25 MG) BY MOUTH DAILY 30 tablet 0 Past Week at Unknown time  . mometasone (ELOCON) 0.1 % cream One application daily as needed for rash 45 g 1 Unknown at PRN  . nitroGLYCERIN (NITROSTAT) 0.4 MG SL tablet Place 0.4 mg under the tongue every 5 (five) minutes as needed for chest pain.    Unknown at PRN  . nystatin (MYCOSTATIN) 100000 UNIT/ML suspension Take 5 mLs (500,000 Units total) by mouth 4 (four) times daily. 60 mL 0 Unknown at Unknown  . oxyCODONE-acetaminophen (PERCOCET) 10-325 MG tablet Take 1 tablet by mouth every 6 (six) hours as needed for pain. 120 tablet 0 Past Week at PRN  . pioglitazone (ACTOS) 15 MG tablet Take 1 tablet (15 mg total) by mouth daily. 30 tablet 4 Past Week at Unknown time  . RABEprazole (ACIPHEX) 20 MG tablet Take 20 mg by mouth daily.   Unknown at Unknown  . ranitidine (ZANTAC) 150 MG capsule Take 150 mg by mouth as needed for heartburn.   Unknown at PRN  . rosuvastatin (CRESTOR) 40 MG tablet TAKE 1 TABLET(40 MG) BY MOUTH DAILY 30 tablet 11 Past Week at Unknown time  . sertraline (ZOLOFT) 100 MG tablet Take 2 tablets (200 mg total) by mouth daily. 60 tablet 1 Past Week at Unknown time  . traZODone (DESYREL) 100 MG tablet Take 2 tablets (200 mg total) by mouth at bedtime. 60 tablet 1 Past Week at 2100    Musculoskeletal: Strength & Muscle Tone: within normal limits Gait & Station: normal Patient leans: N/A  Psychiatric Specialty Exam: Physical Exam  Nursing note and vitals reviewed.   Review of Systems  All other systems reviewed and are negative.   Blood pressure 99/83, pulse 79, temperature 97.7 F (36.5 C), temperature source Oral, resp. rate 16, height 5'  8" (1.727 m), weight 86.2 kg, SpO2 100 %.Body mass index is  28.89 kg/m.  General Appearance: Casual  Eye Contact:  Good  Speech:  Clear and Coherent  Volume:  Normal  Mood:  Euthymic  Affect:  Tearful at times when appropriate  Thought Process:  Coherent and Goal Directed  Orientation:  Full (Time, Place, and Person)  Thought Content:  Logical  Suicidal Thoughts:  No  Homicidal Thoughts:  No  Memory:  Immediate;   Fair  Judgement:  Impaired  Insight:  Fair  Psychomotor Activity:  Normal  Concentration:  Concentration: Fair  Recall:  [employer] of Knowledge:  Fair  Language:  Fair  Akathisia:  Yes      Assets:  Resilience  ADL's:  Intact  Cognition:  WNL  Sleep:  Number of Hours: 7.3    Treatment Plan Summary: 52 yo male admitted due to impulsive overdose as a suicide attempt after being refused a refill on narcotics. Pt is extremely remorseful for his actions the past couple days. HE states that he deeply regrets when he had done and states that he was very upset in the moment. He denies that this was something that he was planning to do. He states that his wife and kids are what keep him going. He is also religious and believes that killing yourself would send you to [place]. He adamantly denies SI currently. He is very genuine in his interaction today and deeply regrets that he did this. He sees Dr. Shea Evans for medications and also started seeing a therapist with [facility]. He has appointments on Friday that he wants to go to. HE is requesting discharge. Will safety plan with his family and if they feels safe with him going, may consider discharge tomorrow.   Plan:  MDD -Restart Zoloft at 200 mg daily -Restart Lamictal 25 mg daily -He has been off Xanax for several months. He states that he has not taken any Xanax up until the day he overdosed. Not likely to have w/d -He will continue therapy with Alden Hipp  Insomnia -Trazodone  Diabetes -Glipizide 5 mg daily -Actos 15 mg daily  Chronic Pain -prn Percocet  Dispo -discharge home  when stable. He will follow up with Dr. Shea Evans     Observation Level/Precautions:  15 minute checks  Laboratory:  Done in ED  Psychotherapy:    Medications:    Consultations:    Discharge Concerns:    Estimated LOS: 1-2 days  Other:     Physician Treatment Plan for Primary Diagnosis: Severe recurrent major depression without psychotic features ([place]) Long Term Goal(s): Improvement in symptoms so as ready for discharge  Short Term Goals: Ability to disclose and discuss suicidal ideas and Ability to demonstrate self-control will improve   I certify that inpatient services furnished can reasonably be expected to improve the patient's condition.    Marylin Crosby, MD 10/2/201911:54 AM

## 2018-01-09 NOTE — [facility] Suicide Risk Assessment (Signed)
[facility] INPATIENT:  Family/Significant Other Suicide Prevention Education  Suicide Prevention Education:  Education Completed; Vincent Hall, Wife, [phone] has been identified by the patient as the family member/significant other with whom the patient will be residing, and identified as the person(s) who will aid the patient in the event of a mental health crisis (suicidal ideations/suicide attempt).  With written consent from the patient, the family member/significant other has been provided the following suicide prevention education, prior to the and/or following the discharge of the patient.  The suicide prevention education provided includes the following:  Suicide risk factors  Suicide prevention and interventions  National Suicide Hotline telephone number  [facility] assessment telephone number  [facility] Emergency Assistance [place] and/or Residential Mobile Crisis Unit telephone number  Request made of family/significant other to:  Remove weapons (e.g., guns, rifles, knives), all items previously/currently identified as safety concern.    Remove drugs/medications (over-the-counter, prescriptions, illicit drugs), all items previously/currently identified as a safety concern.  The family member/significant other verbalizes understanding of the suicide prevention education information provided.  The family member/significant other agrees to remove the items of safety concern listed above.  His wife reports that he has been struggling with depression and anxiety for years but it was amplified when he was out of his medications. She reports that there are guns in the home and they are removing them before he comes home. They are fine with his discharging tomorrow.   Vincent Hall 01/09/2018, 11:53 AM
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
## 2018-01-09 NOTE — Progress Notes (Signed)
   01/09/18 1100  Clinical Encounter Type  Visited With Patient  Visit Type Initial;Spiritual support;Behavioral Health  Referral From Social work  Consult/Referral To Chaplain  Spiritual Encounters  Spiritual Needs Emotional;Other (Comment)   [place] attended the patient's treatment team meeting. Mr. Edge[place] set his goal to go home. The patient has become aware of the hurt he has caused his family by his actions. The patient states that he wants to enjoy life and just move forward.

## 2018-01-09 NOTE — Progress Notes (Signed)
D- Patient alert and oriented. Patient presents in a sad/sullen mood on assessment stating that he slept "awful" last night. Patient states to this writer that he suffers from chronic back pain, rating his pain level A "11/10", and he did request pain medication from this Probation officer. Patient also states that his depression is a "7/10" and his anxiety is a"10/10" stating that "being here" is making him feel this way. Patient denies SI, HI, AVH, at this time. Patient's goal for today is to "go home".  A- Scheduled medications administered to patient, per MD orders. Support and encouragement provided.  Routine safety checks conducted every 15 minutes.  Patient informed to notify staff with problems or concerns.  R- No adverse drug reactions noted. Patient contracts for safety at this time. Patient compliant with medications and treatment plan. Patient receptive, calm, and cooperative. Patient interacts well with others on the unit.  Patient remains safe at this time.

## 2018-01-09 NOTE — BHH Suicide Risk Assessment (Signed)
[facility] Admission Suicide Risk Assessment   Nursing information obtained from:  Patient, Review of record Demographic factors:  Male, Unemployed, Low socioeconomic status Current Mental Status:  NA Loss Factors:  Decline in physical health, Financial problems / change in socioeconomic status Historical Factors:  NA Risk Reduction Factors:  Responsible for children under 54 years of age, Religious beliefs about death, Sense of responsibility to family, Living with another person, especially a relative, Positive social support, Positive therapeutic relationship, Positive coping skills or problem solving skills  Total Time spent with patient: 1 hour Principal Problem: Severe recurrent major depression without psychotic features ([place]) Diagnosis:   Patient Active Problem List   Diagnosis Date Noted  . Severe recurrent major depression without psychotic features ([place]) [F33.2] 01/08/2018    Priority: High  . Suicide attempt ([place]) [T14.91XA] 01/08/2018  . Benzodiazepine overdose [T42.4X1A] 01/08/2018  . Chronic pain [G89.29] 01/08/2018  . Thrush, oral [B37.0] 12/03/2017  . Current moderate episode of major depressive disorder without prior episode ([place]) [F32.1] 07/26/2017  . Anxiety [F41.9] 10/28/2014  . Arthralgia [M25.50] 10/28/2014  . Chest wall pain [R07.89] 10/28/2014  . Dysphagia [R13.10] 10/28/2014  . Fatigue [R53.83] 10/28/2014  . History of tobacco use [Z87.891] 10/28/2014  . Hyperlipidemia, mixed [E78.2] 10/28/2014  . Insomnia [G47.00] 10/28/2014  . Chronic constipation [K59.09] 09/18/2014  . Esophageal reflux [K21.9] 09/18/2014  . Esophageal spasm [K22.4] 09/18/2014  . Coronary artery disease [I25.10] 07/07/2013  . STEMI (ST elevation myocardial infarction) ([place]) [I21.3] 06/22/2013  . Allergic rhinitis [J30.9] 06/18/2008   Subjective Data: See H&P  Continued Clinical Symptoms:  Alcohol Use Disorder Identification Test Final Score (AUDIT): 0 The "Alcohol Use Disorders  Identification Test", Guidelines for Use in Primary Care, Second Edition.  [facility]). Score between 0-7:  no or low risk or alcohol related problems. Score between 8-15:  moderate risk of alcohol related problems. Score between 16-19:  high risk of alcohol related problems. Score 20 or above:  warrants further diagnostic evaluation for alcohol dependence and treatment.   CLINICAL FACTORS:   Previous Psychiatric Diagnoses and Treatments    COGNITIVE FEATURES THAT CONTRIBUTE TO RISK:  None    SUICIDE RISK:   Mild:  Suicidal ideation of limited frequency, intensity, duration, and specificity.  There are no identifiable plans, no associated intent, mild dysphoria and related symptoms, good self-control (both objective and subjective assessment), few other risk factors, and identifiable protective factors, including available and accessible social support.  PLAN OF CARE: See H&P  I certify that inpatient services furnished can reasonably be expected to improve the patient's condition.   Marylin Crosby, MD 01/09/2018, 12:15 PM

## 2018-01-09 NOTE — Tx Team (Signed)
Interdisciplinary Treatment and Diagnostic Plan Update  01/09/2018 Time of Session: Vincent Hall MRN: [id number]  Principal Diagnosis: <principal problem not specified>  Secondary Diagnoses: Active Problems:   Severe recurrent major depression without psychotic features (HCC)   Current Medications:  Current Facility-Administered Medications  Medication Dose Route Frequency Provider Last Rate Last Dose  . acetaminophen (TYLENOL) tablet 650 mg  650 mg Oral Q6H PRN Clapacs, John T, MD      . albuterol (PROVENTIL HFA;VENTOLIN HFA) 108 (90 Base) MCG/ACT inhaler 2 puff  2 puff Inhalation Q6H PRN Clapacs, John T, MD      . alum & mag hydroxide-simeth (MAALOX/MYLANTA) 200-200-20 MG/5ML suspension 30 mL  30 mL Oral Q4H PRN Clapacs, Madie Reno, MD   30 mL at 01/09/18 0404  . aspirin EC tablet 81 mg  81 mg Oral Daily Clapacs, Madie Reno, MD   81 mg at 01/09/18 0857  . azelastine (ASTELIN) 0.1 % nasal spray 2 spray  2 spray Each Nare BID Clapacs, Madie Reno, MD   2 spray at 01/09/18 0858  . glipiZIDE (GLUCOTROL XL) 24 hr tablet 5 mg  5 mg Oral Q breakfast Clapacs, John T, MD   5 mg at 01/09/18 0857  . hydrOXYzine (ATARAX/VISTARIL) tablet 50 mg  50 mg Oral TID PRN Clapacs, Madie Reno, MD   50 mg at 01/09/18 0901  . lamoTRIgine (LAMICTAL) tablet 25 mg  25 mg Oral Daily McNew, Tyson Babinski, MD   25 mg at 01/09/18 1026  . magnesium hydroxide (MILK OF MAGNESIA) suspension 30 mL  30 mL Oral Daily PRN Clapacs, John T, MD      . oxyCODONE-acetaminophen (PERCOCET/ROXICET) 5-325 MG per tablet 1-2 tablet  1-2 tablet Oral Q6H PRN Clapacs, Madie Reno, MD   2 tablet at 01/09/18 1026  . pioglitazone (ACTOS) tablet 15 mg  15 mg Oral Daily Clapacs, Madie Reno, MD   15 mg at 01/09/18 0857  . rosuvastatin (CRESTOR) tablet 40 mg  40 mg Oral q1800 Clapacs, John T, MD      . Derrill Memo ON 01/10/2018] sertraline (ZOLOFT) tablet 200 mg  200 mg Oral Daily McNew, Holly R, MD      . traZODone (DESYREL) tablet 100 mg  100 mg Oral QHS Clapacs, John T, MD        PTA Medications: Medications Prior to Admission  Medication Sig Dispense Refill Last Dose  . albuterol (PROVENTIL HFA;VENTOLIN HFA) 108 (90 Base) MCG/ACT inhaler Inhale 2 puffs into the lungs every 6 (six) hours as needed for wheezing or shortness of breath. 1 Inhaler 3 Unknown at PRN  . ALPRAZolam (XANAX) 0.5 MG tablet TAKE 1 TABLET BY MOUTH TWICE DAILY AS NEEDED FOR ANXIETY 60 tablet 5 Unknown at PRN  . aspirin 81 MG tablet Take 81 mg by mouth daily.   Past Week at Unknown time  . azelastine (ASTELIN) 0.1 % nasal spray Place 2 sprays into both nostrils 2 (two) times daily. 30 mL 6 Unknown at PRN  . azelastine (OPTIVAR) 0.05 % ophthalmic solution drop affect eye twice a day 6 mL 3 Unknown at PRN  . Blood Glucose Monitoring Suppl (ONE TOUCH ULTRA SYSTEM KIT) w/Device KIT 1 kit by Does not apply route once. 1 each 0 Taking  . dexlansoprazole (DEXILANT) 60 MG capsule Take 1 capsule (60 mg total) by mouth daily. 30 capsule 12 Past Week at Unknown time  . divalproex (DEPAKOTE) 250 MG DR tablet Take 250 mg by mouth 3 (three) times daily.  Unknown at Unknown  . glipiZIDE (GLUCOTROL XL) 10 MG 24 hr tablet TAKE 1 TABLET(10 MG) BY MOUTH DAILY (Patient not taking: Reported on 01/08/2018) 30 tablet 3 Not Taking at Unknown time  . glipiZIDE (GLUCOTROL XL) 5 MG 24 hr tablet Take 5 mg by mouth daily with breakfast.   Unknown at Unknown  . glucose blood (ONE TOUCH ULTRA TEST) test strip Check blood sugar 3 times daily E11.65 (uncontrolled insulin dependent diabetes) 100 each 12 Taking  . INVOKAMET 150-500 MG TABS TAKE 1 TABLET BY MOUTH TWICE DAILY 60 tablet 5 Past Week at Unknown time  . lamoTRIgine (LAMICTAL) 25 MG tablet TAKE 1 TABLET(25 MG) BY MOUTH DAILY 30 tablet 0 Past Week at Unknown time  . mometasone (ELOCON) 0.1 % cream One application daily as needed for rash 45 g 1 Unknown at PRN  . nitroGLYCERIN (NITROSTAT) 0.4 MG SL tablet Place 0.4 mg under the tongue every 5 (five) minutes as needed for  chest pain.    Unknown at PRN  . nystatin (MYCOSTATIN) 100000 UNIT/ML suspension Take 5 mLs (500,000 Units total) by mouth 4 (four) times daily. 60 mL 0 Unknown at Unknown  . oxyCODONE-acetaminophen (PERCOCET) 10-325 MG tablet Take 1 tablet by mouth every 6 (six) hours as needed for pain. 120 tablet 0 Past Week at PRN  . pioglitazone (ACTOS) 15 MG tablet Take 1 tablet (15 mg total) by mouth daily. 30 tablet 4 Past Week at Unknown time  . RABEprazole (ACIPHEX) 20 MG tablet Take 20 mg by mouth daily.   Unknown at Unknown  . ranitidine (ZANTAC) 150 MG capsule Take 150 mg by mouth as needed for heartburn.   Unknown at PRN  . rosuvastatin (CRESTOR) 40 MG tablet TAKE 1 TABLET(40 MG) BY MOUTH DAILY 30 tablet 11 Past Week at Unknown time  . sertraline (ZOLOFT) 100 MG tablet Take 2 tablets (200 mg total) by mouth daily. 60 tablet 1 Past Week at Unknown time  . traZODone (DESYREL) 100 MG tablet Take 2 tablets (200 mg total) by mouth at bedtime. 60 tablet 1 Past Week at 2100    Patient Stressors: Financial difficulties Health problems Medication change or noncompliance Substance abuse  Patient Strengths: Capable of independent living Communication skills Supportive family/friends  Treatment Modalities: Medication Management, Group therapy, Case management,  1 to 1 session with clinician, Psychoeducation, Recreational therapy.   Physician Treatment Plan for Primary Diagnosis: <principal problem not specified> Long Term Goal(s):     Short Term Goals:    Medication Management: Evaluate patient's response, side effects, and tolerance of medication regimen.  Therapeutic Interventions: 1 to 1 sessions, Unit Group sessions and Medication administration.  Evaluation of Outcomes: Progressing  Physician Treatment Plan for Secondary Diagnosis: Active Problems:   Severe recurrent major depression without psychotic features ([place])  Long Term Goal(s):     Short Term Goals:       Medication  Management: Evaluate patient's response, side effects, and tolerance of medication regimen.  Therapeutic Interventions: 1 to 1 sessions, Unit Group sessions and Medication administration.  Evaluation of Outcomes: Progressing   RN Treatment Plan for Primary Diagnosis: <principal problem not specified> Long Term Goal(s): Knowledge of disease and therapeutic regimen to maintain health will improve  Short Term Goals: Ability to demonstrate self-control, Ability to participate in decision making will improve, Ability to identify and develop effective coping behaviors will improve and Compliance with prescribed medications will improve  Medication Management: RN will administer medications as ordered by provider, will assess and  evaluate patient's response and provide education to patient for prescribed medication. RN will report any adverse and/or side effects to prescribing provider.  Therapeutic Interventions: 1 on 1 counseling sessions, Psychoeducation, Medication administration, Evaluate responses to treatment, Monitor vital signs and CBGs as ordered, Perform/monitor CIWA, COWS, AIMS and Fall Risk screenings as ordered, Perform wound care treatments as ordered.  Evaluation of Outcomes: Progressing   LCSW Treatment Plan for Primary Diagnosis: <principal problem not specified> Long Term Goal(s): Safe transition to appropriate next level of care at discharge, Engage patient in therapeutic group addressing interpersonal concerns.  Short Term Goals: Engage patient in aftercare planning with referrals and resources, Increase emotional regulation, Identify triggers associated with mental health/substance abuse issues and Increase skills for wellness and recovery  Therapeutic Interventions: Assess for all discharge needs, 1 to 1 time with Social worker, Explore available resources and support systems, Assess for adequacy in community support network, Educate family and significant other(s) on suicide  prevention, Complete Psychosocial Assessment, Interpersonal group therapy.  Evaluation of Outcomes: Progressing   Progress in Treatment: Attending groups: No. Participating in groups: No. Taking medication as prescribed: Yes. Toleration medication: Yes. Family/Significant other contact made: No, will contact:  Wife, Neal Dy [phone] Patient understands diagnosis: Yes. Discussing patient identified problems/goals with staff: Yes. Medical problems stabilized or resolved: Yes. Denies suicidal/homicidal ideation: Yes. Issues/concerns per patient self-inventory: No. Other:   New problem(s) identified: No, Describe:  None  New Short Term/Long Term Goal(s): "Learn that sometimes your mistakes are not really what you want."  Patient Goals: "Learn that sometimes your mistakes are not really what you want."   Discharge Plan or Barriers: To go home and contine to follow up with outpatient provider at [facility].  Reason for Continuation of Hospitalization: Medication stabilization  Estimated Length of Stay: 1 day  Attendees: Patient: Vincent Hall 01/09/2018 11:23 AM  Physician: Amador Cunas, MD 01/09/2018 11:23 AM  Nursing: Eugenio Hoes, RN 01/09/2018 11:23 AM  RN Care Manager: 01/09/2018 11:23 AM  Social Worker: Darin Engels, [place] 01/09/2018 11:23 AM  Recreational Therapist: Isaias Sakai. Marcello Fennel, LRT 01/09/2018 11:23 AM  Other: Derrek Gu, LCSW 01/09/2018 11:23 AM  Other: Marney Doctor, [place] 01/09/2018 11:23 AM  Other: 01/09/2018 11:23 AM    Scribe for Treatment Team: Darin Engels, LCSW 01/09/2018 11:23 AM

## 2018-01-09 NOTE — Progress Notes (Signed)
Report received at 2126pm. Pt. Received on unit at 2148pm.

## 2018-01-09 NOTE — Progress Notes (Signed)
Recreation Therapy Notes  Date: 01/09/18  Time: 9:30 am   Location: Craft room   Behavioral response: N/A   Intervention Topic: Self-care   Discussion/Intervention: Patient did not attend group.   Clinical Observations/Feedback:  Patient did not attend group.        Vincent Hall 01/09/2018 9:53 AM

## 2018-01-09 NOTE — BHH Group Notes (Signed)

## 2018-01-09 NOTE — Plan of Care (Signed)
Patient verbalizes understanding of the general information  that's been provided to him and all questions/concerns have been addressed and answered at this time. Patient denies SI/HI/AVH, however, he rates his depression a "7/10" and his anxiety a "10/10" stating that  "being here" is making him feel this way. Patient has been in his room on and off today, except for meals. Patient has the ability to verbalize his anger/frustrations in appropriate behaviors and has the ability to identify the resources that can assist him meeting his health-care needs. Patient has been working towards maintaining his clinical measurements within normal limits. Patient has been free from injury thus far and remains safe on the unit at this time.  Problem: Education: Goal: Knowledge of [facility] Education information/materials will improve Outcome: Progressing Goal: Emotional status will improve Outcome: Progressing Goal: Mental status will improve Outcome: Progressing Goal: Verbalization of understanding the information provided will improve Outcome: Progressing   Problem: Activity: Goal: Interest or engagement in activities will improve Outcome: Progressing Goal: Sleeping patterns will improve Outcome: Progressing   Problem: Coping: Goal: Ability to verbalize frustrations and anger appropriately will improve Outcome: Progressing Goal: Ability to demonstrate self-control will improve Outcome: Progressing   Problem: Health Behavior/Discharge Planning: Goal: Identification of resources available to assist in meeting health care needs will improve Outcome: Progressing Goal: Compliance with treatment plan for underlying cause of condition will improve Outcome: Progressing   Problem: Physical Regulation: Goal: Ability to maintain clinical measurements within normal limits will improve Outcome: Progressing   Problem: Safety: Goal: Periods of time without injury will increase Outcome:  Progressing   Problem: Education: Goal: Utilization of techniques to improve thought processes will improve Outcome: Progressing Goal: Knowledge of the prescribed therapeutic regimen will improve Outcome: Progressing   Problem: Activity: Goal: Interest or engagement in leisure activities will improve Outcome: Progressing Goal: Imbalance in normal sleep/wake cycle will improve Outcome: Progressing   Problem: Coping: Goal: Coping ability will improve Outcome: Progressing Goal: Will verbalize feelings Outcome: Progressing   Problem: Health Behavior/Discharge Planning: Goal: Ability to make decisions will improve Outcome: Progressing Goal: Compliance with therapeutic regimen will improve Outcome: Progressing   Problem: Safety: Goal: Ability to disclose and discuss suicidal ideas will improve Outcome: Progressing Goal: Ability to identify and utilize support systems that promote safety will improve Outcome: Progressing   Problem: Safety: Goal: Ability to remain free from injury will improve Outcome: Progressing

## 2018-01-10 ENCOUNTER — Telehealth: Payer: Self-pay | Admitting: Family Medicine

## 2018-01-10 LAB — HEMOGLOBIN A1C
Hgb A1c MFr Bld: 8.3 % — ABNORMAL HIGH (ref 4.8–5.6)
Mean Plasma Glucose: 192 mg/dL

## 2018-01-10 LAB — GLUCOSE, CAPILLARY: Glucose-Capillary: 149 mg/dL — ABNORMAL HIGH (ref 70–99)

## 2018-01-10 MED ORDER — OXYCODONE-ACETAMINOPHEN 10-325 MG PO TABS: 1.0000 | ORAL_TABLET | Freq: Four times a day (QID) | ORAL | 0 refills | Status: DC | PRN

## 2018-01-10 NOTE — Telephone Encounter (Signed)
I spoke back with the pharmacy and clarified that the daughter is there not the patient.  She is requesting the early refil.  Thanks  [employer]

## 2018-01-10 NOTE — Plan of Care (Signed)
  Problem: Coping: Goal: Ability to verbalize frustrations and anger appropriately will improve Outcome: Progressing  Patient able to verbalize frustration to staff.

## 2018-01-10 NOTE — BHH Group Notes (Signed)
[place] Group Notes:  (Nursing/MHT/Case Management/Adjunct)  Date:  01/10/2018  Time:  1:59 AM  Type of Therapy:  Group Therapy  Participation Level:  None  Participation Quality:  He came and left not come into the Day room standing out side the door.   Nehemiah Settle 01/10/2018, 1:59 AM

## 2018-01-10 NOTE — BHH Suicide Risk Assessment (Signed)
[facility] Discharge Suicide Risk Assessment   Principal Problem: Severe recurrent major depression without psychotic features [facility]) Discharge Diagnoses:  Patient Active Problem List   Diagnosis Date Noted  . Severe recurrent major depression without psychotic features ([place]) [F33.2] 01/08/2018    Priority: High  . Suicide attempt ([place]) [T14.91XA] 01/08/2018  . Benzodiazepine overdose [T42.4X1A] 01/08/2018  . Chronic pain [G89.29] 01/08/2018  . Thrush, oral [B37.0] 12/03/2017  . Current moderate episode of major depressive disorder without prior episode ([place]) [F32.1] 07/26/2017  . Anxiety [F41.9] 10/28/2014  . Arthralgia [M25.50] 10/28/2014  . Chest wall pain [R07.89] 10/28/2014  . Dysphagia [R13.10] 10/28/2014  . Fatigue [R53.83] 10/28/2014  . History of tobacco use [Z87.891] 10/28/2014  . Hyperlipidemia, mixed [E78.2] 10/28/2014  . Insomnia [G47.00] 10/28/2014  . Chronic constipation [K59.09] 09/18/2014  . Esophageal reflux [K21.9] 09/18/2014  . Esophageal spasm [K22.4] 09/18/2014  . Coronary artery disease [I25.10] 07/07/2013  . STEMI (ST elevation myocardial infarction) ([place]) [I21.3] 06/22/2013  . Allergic rhinitis [J30.9] 06/18/2008    Total Time spent with patient: 15 minutes  Plus 20 minutes of medication reconciliation, discharge planning, and discahrge documentation  Mental Status Per Nursing Assessment::   On Admission:  NA  Demographic Factors:  Male and Unemployed  Loss Factors: Decrease in vocational status  Historical Factors: Impulsivity  Risk Reduction Factors:   Living with another person, especially a relative, Positive social support, Positive therapeutic relationship and Positive coping skills or problem solving skills  Continued Clinical Symptoms:  Chronic Pain  Cognitive Features That Contribute To Risk:  None    Suicide Risk:  Minimal Acute Risk: No identifiable suicidal ideation.   Follow-up Information    [facility]. Go on 01/11/2018.   Specialty:  Behavioral Health Why:  Please follow up with your scheduled appoitments on Friday January 11, 2018 at 9:45am with your psychiatrist and at 11am with your therapist.  Thank you. Contact information: [place] Hart Dolton, MD 01/10/2018, 8:20 AM

## 2018-01-10 NOTE — Progress Notes (Signed)
D: Pt  Alert and oriented x 4, denies SI/HI/AVH, affect is flat, he is pleasant and cooperative. hs thoughts are organized and coherent. Pt stated to writer this evening " I need my oxyocodone for back pain" he appears less anxious and he is interacting with peers and staff appropriately.  A: Pt was offered support and encouragement, given scheduled medications, and  encouraged to attend groups.  15 minute checks were done for safety.  R:Pt attends groups and interacts well with peers and staff, he is complaint with medication. .Pt receptive to treatment and safety maintained on unit.

## 2018-01-10 NOTE — Telephone Encounter (Signed)
Pt is at [facility] in [place] wanting an early refill on the   Oxycodone 10-325  Thanks  teri

## 2018-01-10 NOTE — Progress Notes (Signed)
  [facility] Adult Case Management Discharge Plan :  Will you be returning to the same living situation after discharge:  Yes,  Home with family At discharge, do you have transportation home?: Yes,  Family at discharge Do you have the ability to pay for your medications: Yes,  Insurance  Release of information consent forms completed and in the chart;  Patient's signature needed at discharge.  Patient to Follow up at: Follow-up Information    [facility]. Go on 01/11/2018.   Specialty:  Behavioral Health Why:  Please follow up with your scheduled appoitments on Friday January 11, 2018 at 9:45am with your psychiatrist and at 11am with your therapist.  Thank you. Contact information: [place] [phone]          Next level of care provider has access to [place] and Suicide Prevention discussed: Yes,  Completed with patient and his wife  Have you used any form of tobacco in the last 30 days? (Cigarettes, Smokeless Tobacco, Cigars, and/or Pipes): No  Has patient been referred to the Quitline?: N/A patient is not a smoker  Patient has been referred for addiction treatment: N/A  Darin Engels, LCSW 01/10/2018, 8:18 AM

## 2018-01-10 NOTE — Discharge Summary (Signed)
Physician Discharge Summary Note  Patient:  Vincent Hall is an 52 y.o., male MRN:  [id number] DOB:  February 18, 1966 Patient phone:  [phone] (home)  Patient address:   [place] [address],  Total Time spent with patient: 15 minutes  Plus 20 minutes of medication reconciliation, discharge planning, and discahrge documentation   Date of Admission:  01/08/2018 Date of Discharge: 01/10/18  Reason for Admission:  52 yo male admitted after an attempted overdose on Xanax as a suicide attempt. Pt was upset about not getting his pain medication filled from the pharmacy. He went home and took a handful of Xanax because of this. He refused to go to the hospital at this time. The next day, He saw his PCP, Dr. Caryn Hall yesterday. He voiced thoughts of suicide and started crying. His PCP had him come to the ED for evaluation. He was very demanding in the ED demanding narcotics.             Upon evaluation today, he is much calmer, very nice and pleasant in demeanor. He started crying stating that he feels very guilty for what had happened yesterday and states that her overreacted. He states that he has chronic pain from a CABG in the past. He states that his PCP only sent 90 pills when he was supposed to have 120 pills so he ran out early. He worked it out with the pharmacy and his insurance and thought he would be able to get a refill. When he went to the pharmacy, they refused to fill it. HE states, "I got really upset." He states that he went home and did "something stupid on my part." He states that he put at least 15 tablets of Xanax in his mouth "to get rid of the pain." He states that he is not sure if he was trying to kill himself at that moment. He states that he was able to spit most of them out. He states that he ended up swallowing probably 2-3 pills. His daughter was present when this happened. He then fell asleep and saw his PCP the next day.  He adamantly denies SI or thoughts of  self harm today. He is tearful and states, "I have beautiful mom and dad, a wife and kids." he states they are really what he lives for and is very proud of them. He states that he also reads the bible and does bible study at home. He states, "if you kill yourself then you go to [place]. I don't want that." He states that this attempt was completely impulsive because he was in physical pain. He states that he sees Dr. Shea Hall for medications and last saw her on 9/4. He has been compliant with his medications. He states that he has been off Xanax for several months but had bottle left over. He also started seeing Vincent Hall for therapy and had one session with her. He states that he has appointments with both Dr. Shea Hall and Vincent Hall on Friday.  Pt states that he is very glad to be alive and regrets everything that happened.             He reports history of depression and anger. He states that he feels guilty that he has not been able to work due to pain and his wife has to bring in all the income. He denies consistently feeling depressed. HE is hoping to get on disability which will help the fiances. Pt brightens up significant when  talking about his children.    Principal Problem: Severe recurrent major depression without psychotic features [facility]) Discharge Diagnoses: Patient Active Problem List   Diagnosis Date Noted  . Severe recurrent major depression without psychotic features ([place]) [F33.2] 01/08/2018    Priority: High  . Suicide attempt ([place]) [T14.91XA] 01/08/2018  . Benzodiazepine overdose [T42.4X1A] 01/08/2018  . Chronic pain [G89.29] 01/08/2018  . Thrush, oral [B37.0] 12/03/2017  . Current moderate episode of major depressive disorder without prior episode ([place]) [F32.1] 07/26/2017  . Anxiety [F41.9] 10/28/2014  . Arthralgia [M25.50] 10/28/2014  . Chest wall pain [R07.89] 10/28/2014  . Dysphagia [R13.10] 10/28/2014  . Fatigue [R53.83] 10/28/2014  . History of tobacco use [Z87.891] 10/28/2014   . Hyperlipidemia, mixed [E78.2] 10/28/2014  . Insomnia [G47.00] 10/28/2014  . Chronic constipation [K59.09] 09/18/2014  . Esophageal reflux [K21.9] 09/18/2014  . Esophageal spasm [K22.4] 09/18/2014  . Coronary artery disease [I25.10] 07/07/2013  . STEMI (ST elevation myocardial infarction) ([place]) [I21.3] 06/22/2013  . Allergic rhinitis [J30.9] 06/18/2008    Past Psychiatric History: See H&P  Past Medical History:  Past Medical History:  Diagnosis Date  . Asthma   . Coronary artery disease   . Fatty liver   . History of MI (myocardial infarction)     Past Surgical History:  Procedure Laterality Date  . APPENDECTOMY    . CARDIAC CATHETERIZATION  06/24/2013  . COLONOSCOPY WITH PROPOFOL N/A 09/23/2014   Procedure: COLONOSCOPY WITH PROPOFOL;  Surgeon: Lucilla Lame, MD;  Location: [place];  Service: Endoscopy;  Laterality: N/A;  . CORONARY ANGIOPLASTY     stent placement to LAD  . CORONARY ARTERY BYPASS GRAFT  07/31/2013   x 3 vessels  . FINGER SURGERY    . NASAL SINUS SURGERY    . POLYPECTOMY  09/23/2014   Procedure: POLYPECTOMY;  Surgeon: Lucilla Lame, MD;  Location: [place];  Service: Endoscopy;;   Family History:  Family History  Problem Relation Age of Onset  . Hyperlipidemia Mother   . Heart attack Father   . Hyperlipidemia Father   . Diabetes Father   . Heart disease Father    Family Psychiatric  History: See H&P Social History:  Social History   Substance and Sexual Activity  Alcohol Use No     Social History   Substance and Sexual Activity  Drug Use No    Social History   Socioeconomic History  . Marital status: Married    Spouse name: Vincent Hall  . Number of children: 2  . Years of education: HS Grad  . Highest education level: Associate degree: occupational, Hotel manager, or vocational program  Occupational History  . Occupation: [employer]  Social Needs  . Financial resource strain: Somewhat hard  . Food insecurity:    Worry:  Never true    Inability: Never true  . Transportation needs:    Medical: No    Non-medical: No  Tobacco Use  . Smoking status: Former Smoker    Packs/day: 0.25    Years: 30.00    Pack years: 7.50    Types: Cigarettes    Last attempt to quit: 08/08/2013    Years since quitting: 4.4  . Smokeless tobacco: Never Used  Substance and Sexual Activity  . Alcohol use: No  . Drug use: No  . Sexual activity: Yes  Lifestyle  . Physical activity:    Days per week: 5 days    Minutes per session: 60 min  . Stress: Very much  Relationships  . Social  connections:    Talks on phone: Not on file    Gets together: Not on file    Attends religious service: Never    Active member of club or organization: No    Attends meetings of clubs or organizations: Never    Relationship status: Married  Other Topics Concern  . Not on file  Social History Narrative  . Not on file    Hospital Course:  Pt was restarted on home medications with no changes. He participated in groups and social with peers. He expressed signcaint guilt and remorse for the incident that happended. On day of discharge, he had bright affect. He continued to adamently deny SI or self harm thoguhts. He loves his wife and kids and states that he would never try to kill himself. He is tearful when talking about the incident staing that he feels very guilty and has so much to live for.  He has appointment with Dr. Alejandro Mulling and Vincent Hall for thearpy tomorrow that he is looking forward to. He requested discharge and no longer meeting [facility].   The patient is at low risk of imminent suicide. Patient denied thoughts, intent, or plan for harm to self or others, expressed significant future orientation, and expressed an ability to mobilize assistance for his needs. he is presently void of any contributing psychiatric symptoms, cognitive difficulties, or substance use which would elevate his risk for lethality. Chronic risk for lethality is  elevated in light of chronic pain, impulsivity. The chronic risk is presently mitigated by his ongoing desire and engagement in [facility] treatment and mobilization of support from family and friends. Chronic risk may elevate if he experiences any significant loss or worsening of symptoms, which can be managed and monitored through outpatient providers. At this time,a cute risk for lethality is low and he is stable for ongoing outpatient management.   Modifiable risk factors were addressed during this hospitalization through appropriate pharmacotherapy and establishment of outpatient follow-up treatment. Some risk factors for suicide are situational (i.e. Unstable housing) or related personality pathology (i.e. Poor coping mechanisms) and thus cannot be further mitigated by continued hospitalization in this setting.    Physical Findings: AIMS:  , ,  ,  ,    CIWA:    COWS:     Musculoskeletal: Strength & Muscle Tone: Normal Gait & Station: normal Patient leans: N/A  Psychiatric Specialty Exam: Physical Exam  Nursing note and vitals reviewed. Constitutional: He appears well-developed.    Review of Systems  All other systems reviewed and are negative.   Blood pressure 116/69, pulse (!) 56, temperature 97.8 F (36.6 C), temperature source Oral, resp. rate 18, height _0  (1.727 m), weight 86.2 kg, SpO2 97 %.Body mass index is 28.89 kg/m.  General Appearance: Casual  Eye Contact:  Good  Speech:  Clear and Coherent  Volume:  Normal  Mood:  Euthymic  Affect:  Congruent  Thought Process:  Coherent and Goal Directed  Orientation:  Full (Time, Place, and Person)  Thought Content:  Logical  Suicidal Thoughts:  No  Homicidal Thoughts:  No  Memory:  Immediate;   Fair  Judgement:  Fair  Insight:  Fair  Psychomotor Activity:  Normal  Concentration:  Concentration: Fair  Recall:  [employer] of Knowledge:  Fair  Language:  Fair  Akathisia:  No      Assets:  Resilience  ADL's:  Intact   Cognition:  WNL  Sleep:  Number of Hours: 5.75  Have you used any form of tobacco in the last 30 days? (Cigarettes, Smokeless Tobacco, Cigars, and/or Pipes): No  Has this patient used any form of tobacco in the last 30 days? (Cigarettes, Smokeless Tobacco, Cigars, and/or Pipes) No  Blood Alcohol level:  Lab Results  Component Value Date   ETH <10 85/27/7824    Metabolic Disorder Labs:  Lab Results  Component Value Date   HGBA1C 8.3 (H) 01/08/2018   MPG 192 01/08/2018   No results found for: PROLACTIN Lab Results  Component Value Date   CHOL 132 01/08/2018   TRIG 111 01/08/2018   HDL 49 01/08/2018   CHOLHDL 2.7 01/08/2018   VLDL 22 01/08/2018   LDLCALC 61 01/08/2018   LDLCALC 79 07/26/2017    See Psychiatric Specialty Exam and Suicide Risk Assessment completed by Attending Physician prior to discharge.  Discharge destination:  Home  Is patient on multiple antipsychotic therapies at discharge:  No   Has Patient had three or more failed trials of antipsychotic monotherapy by history:  No  Recommended Plan for Multiple Antipsychotic Therapies: NA   Allergies as of 01/10/2018      Reactions   Montelukast Hives   Singular   Singulair [montelukast Sodium] Hives   Atorvastatin Other (See Comments)   Muscle pain, neck stiffness      Medication List    STOP taking these medications   ALPRAZolam 0.5 MG tablet Commonly known as:  XANAX   divalproex 250 MG DR tablet Commonly known as:  DEPAKOTE     TAKE these medications     Indication  albuterol 108 (90 Base) MCG/ACT inhaler Commonly known as:  PROVENTIL HFA;VENTOLIN HFA Inhale 2 puffs into the lungs every 6 (six) hours as needed for wheezing or shortness of breath.  Indication:  Disease Involving Spasms of the Bronchus   aspirin 81 MG tablet Take 81 mg by mouth daily.  Indication:  mi   azelastine 0.05 % ophthalmic solution Commonly known as:  OPTIVAR drop affect eye twice a day  Indication:   Allergic Conjunctivitis   azelastine 0.1 % nasal spray Commonly known as:  ASTELIN Place 2 sprays into both nostrils 2 (two) times daily.  Indication:  Hayfever   dexlansoprazole 60 MG capsule Commonly known as:  DEXILANT Take 1 capsule (60 mg total) by mouth daily.  Indication:  Gastroesophageal Reflux Disease   glipiZIDE 5 MG 24 hr tablet Commonly known as:  GLUCOTROL XL Take 5 mg by mouth daily with breakfast. What changed:  Another medication with the same name was removed. Continue taking this medication, and follow the directions you see here.  Indication:  Type 2 Diabetes   glucose blood test strip Check blood sugar 3 times daily E11.65 (uncontrolled insulin dependent diabetes)  Indication:  monitoring   INVOKAMET 150-500 MG Tabs Generic drug:  Canagliflozin-metFORMIN HCl TAKE 1 TABLET BY MOUTH TWICE DAILY  Indication:  Type 2 Diabetes   lamoTRIgine 25 MG tablet Commonly known as:  LAMICTAL TAKE 1 TABLET(25 MG) BY MOUTH DAILY  Indication:  Mood   mometasone 0.1 % cream Commonly known as:  ELOCON One application daily as needed for rash  Indication:  Skin Disease Successfully Treated with Steroid Therapy   nitroGLYCERIN 0.4 MG SL tablet Commonly known as:  NITROSTAT Place 0.4 mg under the tongue every 5 (five) minutes as needed for chest pain.  Indication:  Acute Angina Pectoris   nystatin 100000 UNIT/ML suspension Commonly known as:  MYCOSTATIN Take 5 mLs (500,000 Units  total) by mouth 4 (four) times daily.  Indication:  a   ONE TOUCH ULTRA SYSTEM KIT w/Device Kit 1 kit by Does not apply route once.  Indication:  mONITORING   pioglitazone 15 MG tablet Commonly known as:  ACTOS Take 1 tablet (15 mg total) by mouth daily.  Indication:  Type 2 Diabetes   RABEprazole 20 MG tablet Commonly known as:  ACIPHEX Take 20 mg by mouth daily.  Indication:  Treatment to Prevent Stress Ulcers   ranitidine 150 MG capsule Commonly known as:  ZANTAC Take 150 mg by  mouth as needed for heartburn.  Indication:  Gastroesophageal Reflux Disease   rosuvastatin 40 MG tablet Commonly known as:  CRESTOR TAKE 1 TABLET(40 MG) BY MOUTH DAILY  Indication:  High Amount of Fats in the Blood   sertraline 100 MG tablet Commonly known as:  ZOLOFT Take 2 tablets (200 mg total) by mouth daily.  Indication:  Major Depressive Disorder   traZODone 100 MG tablet Commonly known as:  DESYREL Take 2 tablets (200 mg total) by mouth at bedtime.  Indication:  Trouble Sleeping     ASK your doctor about these medications     Indication  oxyCODONE-acetaminophen 10-325 MG tablet Commonly known as:  PERCOCET Take 1 tablet by mouth every 6 (six) hours as needed for pain. Ask about: Which instructions should I use?  Indication:  Pain      Follow-up Information    [facility]. Go on 01/11/2018.   Specialty:  Behavioral Health Why:  Please follow up with your scheduled appoitments on Friday January 11, 2018 at 9:45am with your psychiatrist and at 11am with your therapist.  Thank you. Contact information: [place] [phone]           Signed: Marylin Crosby, MD 01/10/2018, 8:21 AM

## 2018-01-10 NOTE — Progress Notes (Signed)
Recreation Therapy Notes  Date: 01/10/2018  Time: 9:30 am  Location: Craft Room  Behavioral response: Appropriate   Intervention Topic: Problem Solving  Discussion/Intervention:  Group content on today was focused on problem solving. The group described what problem solving is. Patients expressed how problems affect them and how they deal with problems. Individuals identified healthy ways to deal with problems. Patients explained what normally happens to them when they do not deal with problems. The group expressed reoccurring problems for them. The group participated in the intervention "Ways to Solve problems" where patients were given a chance to explore different ways to solve problems.  Clinical Observations/Feedback:  Patient came to group and stated problem solving is just working on your problems. He explained that you can not work on your problems unless you identify them.Individualk was social with peers and staff while participating in the intervention.  Daimion Adamcik LRT/CTRS         Donabelle Molden 01/10/2018 11:11 AM

## 2018-01-10 NOTE — Telephone Encounter (Signed)
Approved RF #60 oxycodone/apap two days early. Patient is to follow up 10-14 days.

## 2018-01-10 NOTE — Telephone Encounter (Signed)
New prescription was sent into the pharmacy by Dr. Caryn Section. Patients daughter advised of this. Per Dr. Caryn Section, patient needs a follow up appointment in 10-14 days. Appointment scheduled for 01/21/2018 at 2:40pm.

## 2018-01-10 NOTE — Progress Notes (Signed)
Patient ID: Vincent Hall, male   DOB: 23-Jan-1966, 52 y.o.   MRN: [id number]  Discharge Note:  Patient denies SI/HI/AVH at this time. Discharge instructions, AVS, prescriptions, and transition record gone over with patient. Patient agrees to comply with medication management, follow-up visit, and outpatient therapy. Patient belongings returned to patient. Patient questions and concerns addressed and answered. Patient ambulatory off unit. Patient discharged to home with wife and daughter.

## 2018-01-11 ENCOUNTER — Ambulatory Visit: Payer: Self-pay | Admitting: Licensed Clinical Social Worker

## 2018-01-11 ENCOUNTER — Encounter: Payer: Self-pay | Admitting: Psychiatry

## 2018-01-11 ENCOUNTER — Ambulatory Visit (INDEPENDENT_AMBULATORY_CARE_PROVIDER_SITE_OTHER): Payer: BLUE CROSS/BLUE SHIELD | Admitting: Psychiatry

## 2018-01-11 ENCOUNTER — Telehealth: Payer: Self-pay | Admitting: Family Medicine

## 2018-01-11 VITALS — BP 122/82 | HR 67 | Ht 67.0 in | Wt 195.0 lb

## 2018-01-11 DIAGNOSIS — F331 Major depressive disorder, recurrent, moderate: Secondary | ICD-10-CM

## 2018-01-11 DIAGNOSIS — F411 Generalized anxiety disorder: Secondary | ICD-10-CM

## 2018-01-11 DIAGNOSIS — F5105 Insomnia due to other mental disorder: Secondary | ICD-10-CM

## 2018-01-11 DIAGNOSIS — F410 Panic disorder [episodic paroxysmal anxiety] without agoraphobia: Secondary | ICD-10-CM | POA: Diagnosis not present

## 2018-01-11 MED ORDER — HYDROXYZINE PAMOATE 25 MG PO CAPS: 25.0000 mg | ORAL_CAPSULE | ORAL | 1 refills | Status: DC

## 2018-01-11 MED ORDER — LAMOTRIGINE 25 MG PO TABS: 25.0000 mg | ORAL_TABLET | Freq: Two times a day (BID) | ORAL | 0 refills | Status: DC

## 2018-01-11 MED ORDER — QUETIAPINE FUMARATE 25 MG PO TABS: 25.0000 mg | ORAL_TABLET | Freq: Every day | ORAL | 0 refills | Status: DC

## 2018-01-11 NOTE — Telephone Encounter (Signed)
Please review. Thanks!  

## 2018-01-11 NOTE — Patient Instructions (Signed)
Hydroxyzine capsules or tablets What is this medicine? HYDROXYZINE (hye [place] i zeen) is an antihistamine. This medicine is used to treat allergy symptoms. It is also used to treat anxiety and tension. This medicine can be used with other medicines to induce sleep before surgery. This medicine may be used for other purposes; ask your health care provider or pharmacist if you have questions. COMMON BRAND NAME(S): ANX, Atarax, Rezine, Vistaril What should I tell my health care provider before I take this medicine? They need to know if you have any of these conditions: -any chronic illness -difficulty passing urine -glaucoma -heart disease -kidney disease -liver disease -lung disease -an unusual or allergic reaction to hydroxyzine, cetirizine, other medicines, foods, dyes, or preservatives -pregnant or trying to get pregnant -breast-feeding How should I use this medicine? Take this medicine by mouth with a full glass of water. Follow the directions on the prescription label. You may take this medicine with food or on an empty stomach. Take your medicine at regular intervals. Do not take your medicine more often than directed. Talk to your pediatrician regarding the use of this medicine in children. Special care may be needed. While this drug may be prescribed for children as young as 75 years of age for selected conditions, precautions do apply. Patients over 62 years old may have a stronger reaction and need a smaller dose. Overdosage: If you think you have taken too much of this medicine contact a poison control center or emergency room at once. NOTE: This medicine is only for you. Do not share this medicine with others. What if I miss a dose? If you miss a dose, take it as soon as you can. If it is almost time for your next dose, take only that dose. Do not take double or extra doses. What may interact with this medicine? -alcohol -barbiturate medicines for sleep or seizures -medicines for  colds, allergies -medicines for depression, anxiety, or emotional disturbances -medicines for pain -medicines for sleep -muscle relaxants This list may not describe all possible interactions. Give your health care provider a list of all the medicines, herbs, non-prescription drugs, or dietary supplements you use. Also tell them if you smoke, drink alcohol, or use illegal drugs. Some items may interact with your medicine. What should I watch for while using this medicine? Tell your doctor or health care professional if your symptoms do not improve. You may get drowsy or dizzy. Do not drive, use machinery, or do anything that needs mental alertness until you know how this medicine affects you. Do not stand or sit up quickly, especially if you are an older patient. This reduces the risk of dizzy or fainting spells. Alcohol may interfere with the effect of this medicine. Avoid alcoholic drinks. Your mouth may get dry. Chewing sugarless gum or sucking hard candy, and drinking plenty of water may help. Contact your doctor if the problem does not go away or is severe. This medicine may cause dry eyes and blurred vision. If you wear contact lenses you may feel some discomfort. Lubricating drops may help. See your eye doctor if the problem does not go away or is severe. If you are receiving skin tests for allergies, tell your doctor you are using this medicine. What side effects may I notice from receiving this medicine? Side effects that you should report to your doctor or health care professional as soon as possible: -fast or irregular heartbeat -difficulty passing urine -seizures -slurred speech or confusion -tremor Side effects that  usually do not require medical attention (report to your doctor or health care professional if they continue or are bothersome): -constipation -drowsiness -fatigue -headache -stomach upset This list may not describe all possible side effects. Call your doctor for  medical advice about side effects. You may report side effects to FDA at 1-800-FDA-1088. Where should I keep my medicine? Keep out of the reach of children. Store at room temperature between 15 and 30 degrees C (59 and 86 degrees F). Keep container tightly closed. Throw away any unused medicine after the expiration date. NOTE: This sheet is a summary. It may not cover all possible information. If you have questions about this medicine, talk to your doctor, pharmacist, or health care provider.  2018 [employer]/Gold Standard (2007-08-09 14:50:59) Quetiapine tablets What is this medicine? QUETIAPINE (kwe TYE a peen) is an antipsychotic. It is used to treat schizophrenia and bipolar disorder, also known as manic-depression. This medicine may be used for other purposes; ask your health care provider or pharmacist if you have questions. COMMON BRAND NAME(S): Seroquel What should I tell my health care provider before I take this medicine? They need to know if you have any of these conditions: -brain tumor or head injury -breast cancer -cataracts -diabetes -difficulty swallowing -heart disease -kidney disease -liver disease -low blood counts, like low white cell, platelet, or red cell counts -low blood pressure or dizziness when standing up -Parkinson's disease -previous heart attack -seizures -suicidal thoughts, plans, or attempt by you or a family member -thyroid disease -an unusual or allergic reaction to quetiapine, other medicines, foods, dyes, or preservatives -pregnant or trying to get pregnant -breast-feeding How should I use this medicine? Take this medicine by mouth. Swallow it with a drink of water. Follow the directions on the prescription label. If it upsets your stomach you can take it with food. Take your medicine at regular intervals. Do not take it more often than directed. Do not stop taking except on the advice of your doctor or health care professional. A special MedGuide  will be given to you by the pharmacist with each prescription and refill. Be sure to read this information carefully each time. Talk to your pediatrician regarding the use of this medicine in children. While this drug may be prescribed for children as young as 10 years for selected conditions, precautions do apply. Patients over age 76 years may have a stronger reaction to this medicine and need smaller doses. Overdosage: If you think you have taken too much of this medicine contact a poison control center or emergency room at once. NOTE: This medicine is only for you. Do not share this medicine with others. What if I miss a dose? If you miss a dose, take it as soon as you can. If it is almost time for your next dose, take only that dose. Do not take double or extra doses. What may interact with this medicine? Do not take this medicine with any of the following medications: -certain medicines for fungal infections like fluconazole, itraconazole, ketoconazole, posaconazole, voriconazole -cisapride -dofetilide -dronedarone -droperidol -grepafloxacin -halofantrine -phenothiazines like chlorpromazine, mesoridazine, thioridazine -pimozide -sparfloxacin -ziprasidone This medicine may also interact with the following medications: -alcohol -antiviral medicines for HIV or AIDS -certain medicines for blood pressure -certain medicines for depression, anxiety, or psychotic disturbances like haloperidol, lorazepam -certain medicines for diabetes -certain medicines for Parkinson's disease -certain medicines for seizures like carbamazepine, phenobarbital, phenytoin -cimetidine -erythromycin -other medicines that prolong the QT interval (cause an abnormal heart rhythm) -rifampin -steroid  medicines like prednisone or cortisone This list may not describe all possible interactions. Give your health care provider a list of all the medicines, herbs, non-prescription drugs, or dietary supplements you use.  Also tell them if you smoke, drink alcohol, or use illegal drugs. Some items may interact with your medicine. What should I watch for while using this medicine? Visit your doctor or health care professional for regular checks on your progress. It may be several weeks before you see the full effects of this medicine. Your health care provider may suggest that you have your eyes examined prior to starting this medicine, and every 6 months thereafter. If you have been taking this medicine regularly for some time, do not suddenly stop taking it. You must gradually reduce the dose or your symptoms may get worse. Ask your doctor or health care professional for advice. Patients and their families should watch out for worsening depression or thoughts of suicide. Also watch out for sudden or severe changes in feelings such as feeling anxious, agitated, panicky, irritable, hostile, aggressive, impulsive, severely restless, overly excited and hyperactive, or not being able to sleep. If this happens, especially at the beginning of antidepressant treatment or after a change in dose, call your health care professional. Dennis Bast may get dizzy or drowsy. Do not drive, use machinery, or do anything that needs mental alertness until you know how this medicine affects you. Do not stand or sit up quickly, especially if you are an older patient. This reduces the risk of dizzy or fainting spells. Alcohol can increase dizziness and drowsiness. Avoid alcoholic drinks. Do not treat yourself for colds, diarrhea or allergies. Ask your doctor or health care professional for advice, some ingredients may increase possible side effects. This medicine can reduce the response of your body to heat or cold. Dress warm in cold weather and stay hydrated in hot weather. If possible, avoid extreme temperatures like saunas, hot tubs, very hot or cold showers, or activities that can cause dehydration such as vigorous exercise. What side effects may I  notice from receiving this medicine? Side effects that you should report to your doctor or health care professional as soon as possible: -allergic reactions like skin rash, itching or hives, swelling of the face, lips, or tongue -difficulty swallowing -fast or irregular heartbeat -fever or chills, sore throat -fever with rash, swollen lymph nodes, or swelling of the face -increased hunger or thirst -increased urination -problems with balance, talking, walking -seizures -stiff muscles -suicidal thoughts or other mood changes -uncontrollable head, mouth, neck, arm, or leg movements -unusually weak or tired Side effects that usually do not require medical attention (report to your doctor or health care professional if they continue or are bothersome): -change in sex drive or performance -constipation -drowsy or dizzy -dry mouth -stomach upset -weight gain This list may not describe all possible side effects. Call your doctor for medical advice about side effects. You may report side effects to FDA at 1-800-FDA-1088. Where should I keep my medicine? Keep out of the reach of children. Store at room temperature between 15 and 30 degrees C (59 and 86 degrees F). Throw away any unused medicine after the expiration date. NOTE: This sheet is a summary. It may not cover all possible information. If you have questions about this medicine, talk to your doctor, pharmacist, or health care provider.  2018 [employer]/Gold Standard (2014-09-29 13:07:35)

## 2018-01-11 NOTE — Progress Notes (Signed)
[name]  OP Progress Note  01/11/2018 11:59 AM Vincent Hall  MRN:  [id number]  Chief Complaint:' I am here for follow up.'  Chief Complaint    Follow-up     HPI: Vincent Hall is a 52 year old Hispanic male, lives in [place], unemployed, married, has a history of depression, anxiety, sleep problems, chronic pain coronary artery disease, status post bypass graft, hypertension, hyperlipidemia, diabetes, presented to the clinic today for a follow-up visit.  Patient today presented along with his daughterMarshall Hall.'  Patient had a suicide attempt recently.  Patient reports that on 01/07/2018 he was in so much pain and he had ran out of his pain medication.  He reports he had done some work around the house the previous days and that could have made his pain worse.  Patient reports he took more pills during that time for his pain, he reports his provider who is prescribing the pain medication is aware of this and he was told he could take more as needed those days when pain is more severe .  He however reports he had some trouble refilling his prescription since it was 1 day ahead of time.  He reports he  struggled with pain all day and this made his mood more irritable and anxious.  He reports he hence impulsively took a few pills of Xanax that he had left from a previous prescription.  He reports that he did it in front of his wife and daughter.    His daughter who works as a Designer, fashion/clothing.  She reports even though he had taken a few pills of Xanax he continued to be awake most of the night.  She reports he was also agitated and found to be irritable.  She reports she hence wanted to wait until he slept to call 911 to bring him to the ED and hence waited.  He however did not sleep through the night and the next day they took him to his primary medical doctor who convinced him to go to the emergency department.  Patient was admitted to [facility] behavioral health inpatient unit for a day.   He was found to be more calm and remorseful of his actions he was continued on his medications like Lamictal, Zoloft and trazodone.  He was discharged on October 3 rd, and was advised to follow-up with his  outpatient psychiatric provider.  Patient today seen as calm and cooperative.  He reports he regrets his action.  He reports he would ask for help before doing things like this again.  He reports he is going to his primary medical doctor to get more help for his pain.  He does report sleep problems and reports trazodone as not helpful anymore.  Discussed adding Seroquel however discussed the risk including the impact on his cardiac health.  Discussed with patient that an EKG needs to be done to monitor his QTC.  Also discussed adding hydroxyzine 25 mg - 50 mg  as needed only for those times when he is very agitated or anxious.  Discussed with him to limit use.  Discussed with patient about treatment options like intensive outpatient program.  He will meet with our therapist Merleen Nicely for individual counseling.  Discussed with him to continue therapy appointments on a frequent basis.  And if he feels he is decompensating again he does have the option to start IOP program.  Treatment plan was discussed with patient as well as daughter who voiced understanding. Visit Diagnosis:  ICD-10-CM   1. MDD (major depressive disorder), recurrent episode, moderate (HCC) F33.1   2. GAD (generalized anxiety disorder) F41.1   3. Insomnia due to mental condition F51.05   4. Panic attacks F41.0     Past Psychiatric History: Reviewed past psychiatric history from my progress note on 09/05/2017.  Past trials of Zoloft, Xanax, Ambien, Depakote  Past Medical History:  Past Medical History:  Diagnosis Date  . Asthma   . Coronary artery disease   . Fatty liver   . History of MI (myocardial infarction)     Past Surgical History:  Procedure Laterality Date  . APPENDECTOMY    . CARDIAC CATHETERIZATION  06/24/2013   . COLONOSCOPY WITH PROPOFOL N/A 09/23/2014   Procedure: COLONOSCOPY WITH PROPOFOL;  Surgeon: Lucilla Lame, MD;  Location: [place];  Service: Endoscopy;  Laterality: N/A;  . CORONARY ANGIOPLASTY     stent placement to LAD  . CORONARY ARTERY BYPASS GRAFT  07/31/2013   x 3 vessels  . FINGER SURGERY    . NASAL SINUS SURGERY    . POLYPECTOMY  09/23/2014   Procedure: POLYPECTOMY;  Surgeon: Lucilla Lame, MD;  Location: [place];  Service: Endoscopy;;    Family Psychiatric History: I have reviewed family psychiatric history from my progress note on 09/05/2017  Family History:  Family History  Problem Relation Age of Onset  . Hyperlipidemia Mother   . Heart attack Father   . Hyperlipidemia Father   . Diabetes Father   . Heart disease Father     Social History: Reviewed social history from my progress note on 09/05/2017 Social History   Socioeconomic History  . Marital status: Married    Spouse name: clementin  . Number of children: 2  . Years of education: HS Grad  . Highest education level: Associate degree: occupational, Hotel manager, or vocational program  Occupational History  . Occupation: [employer]  Social Needs  . Financial resource strain: Somewhat hard  . Food insecurity:    Worry: Never true    Inability: Never true  . Transportation needs:    Medical: No    Non-medical: No  Tobacco Use  . Smoking status: Former Smoker    Packs/day: 0.25    Years: 30.00    Pack years: 7.50    Types: Cigarettes    Last attempt to quit: 08/08/2013    Years since quitting: 4.4  . Smokeless tobacco: Never Used  Substance and Sexual Activity  . Alcohol use: No  . Drug use: No  . Sexual activity: Not Currently  Lifestyle  . Physical activity:    Days per week: 5 days    Minutes per session: 60 min  . Stress: Very much  Relationships  . Social connections:    Talks on phone: Not on file    Gets together: Not on file    Attends religious service: Never    Active  member of club or organization: No    Attends meetings of clubs or organizations: Never    Relationship status: Married  Other Topics Concern  . Not on file  Social History Narrative  . Not on file    Allergies:  Allergies  Allergen Reactions  . Montelukast Hives    Singular  . Depakote [Divalproex Sodium] Rash  . Singulair [Montelukast Sodium] Hives  . Atorvastatin Other (See Comments)    Muscle pain, neck stiffness    Metabolic Disorder Labs: Lab Results  Component Value Date   HGBA1C 8.3 (H) 01/08/2018  MPG 192 01/08/2018   No results found for: PROLACTIN Lab Results  Component Value Date   CHOL 132 01/08/2018   TRIG 111 01/08/2018   HDL 49 01/08/2018   CHOLHDL 2.7 01/08/2018   VLDL 22 01/08/2018   LDLCALC 61 01/08/2018   LDLCALC 79 07/26/2017   Lab Results  Component Value Date   TSH 0.580 01/08/2018   TSH 1.90 01/31/2017    Therapeutic Level Labs: No results found for: LITHIUM No results found for: VALPROATE No components found for:  CBMZ  Current Medications: Current Outpatient Medications  Medication Sig Dispense Refill  . albuterol (PROVENTIL HFA;VENTOLIN HFA) 108 (90 Base) MCG/ACT inhaler Inhale 2 puffs into the lungs every 6 (six) hours as needed for wheezing or shortness of breath. 1 Inhaler 3  . aspirin 81 MG tablet Take 81 mg by mouth daily.    Marland Kitchen azelastine (ASTELIN) 0.1 % nasal spray Place 2 sprays into both nostrils 2 (two) times daily. 30 mL 6  . azelastine (OPTIVAR) 0.05 % ophthalmic solution drop affect eye twice a day 6 mL 3  . Blood Glucose Monitoring Suppl (ONE TOUCH ULTRA SYSTEM KIT) w/Device KIT 1 kit by Does not apply route once. 1 each 0  . dexlansoprazole (DEXILANT) 60 MG capsule Take 1 capsule (60 mg total) by mouth daily. 30 capsule 12  . glipiZIDE (GLUCOTROL XL) 5 MG 24 hr tablet Take 5 mg by mouth daily with breakfast.    . glucose blood (ONE TOUCH ULTRA TEST) test strip Check blood sugar 3 times daily E11.65 (uncontrolled  insulin dependent diabetes) 100 each 12  . INVOKAMET 150-500 MG TABS TAKE 1 TABLET BY MOUTH TWICE DAILY 60 tablet 5  . lamoTRIgine (LAMICTAL) 25 MG tablet Take 1 tablet (25 mg total) by mouth 2 (two) times daily. 60 tablet 0  . mometasone (ELOCON) 0.1 % cream One application daily as needed for rash 45 g 1  . nitroGLYCERIN (NITROSTAT) 0.4 MG SL tablet Place 0.4 mg under the tongue every 5 (five) minutes as needed for chest pain.     Marland Kitchen nystatin (MYCOSTATIN) 100000 UNIT/ML suspension Take 5 mLs (500,000 Units total) by mouth 4 (four) times daily. 60 mL 0  . oxyCODONE-acetaminophen (PERCOCET) 10-325 MG tablet Take 1 tablet by mouth every 6 (six) hours as needed for pain. 60 tablet 0  . pioglitazone (ACTOS) 15 MG tablet Take 1 tablet (15 mg total) by mouth daily. 30 tablet 4  . RABEprazole (ACIPHEX) 20 MG tablet Take 20 mg by mouth daily.    . ranitidine (ZANTAC) 150 MG capsule Take 150 mg by mouth as needed for heartburn.    . rosuvastatin (CRESTOR) 40 MG tablet TAKE 1 TABLET(40 MG) BY MOUTH DAILY 30 tablet 11  . sertraline (ZOLOFT) 100 MG tablet Take 2 tablets (200 mg total) by mouth daily. 60 tablet 1  . hydrOXYzine (VISTARIL) 25 MG capsule Take 1-2 capsules (25-50 mg total) by mouth as directed. Only for severe anxiety symptoms - 2-3 times a week. 60 capsule 1  . QUEtiapine (SEROQUEL) 25 MG tablet Take 1 tablet (25 mg total) by mouth at bedtime. For mood and sleep 30 tablet 0   No current facility-administered medications for this visit.      Musculoskeletal: Strength & Muscle Tone: within normal limits Gait & Station: normal Patient leans: N/A  Psychiatric Specialty Exam: Review of Systems  Psychiatric/Behavioral: The patient is nervous/anxious and has insomnia.   All other systems reviewed and are negative.   Blood pressure  122/82, pulse 67, height '5\' 7"'  (1.702 m), weight 195 lb (88.5 kg), SpO2 95 %.Body mass index is 30.54 kg/m.  General Appearance: Casual  Eye Contact:  Fair   Speech:  Clear and Coherent  Volume:  Normal  Mood:  Anxious  Affect:  Congruent  Thought Process:  Goal Directed and Descriptions of Associations: Intact  Orientation:  Full (Time, Place, and Person)  Thought Content: Logical   Suicidal Thoughts:  No  Homicidal Thoughts:  No  Memory:  Immediate;   Fair Recent;   Fair Remote;   Fair  Judgement:  Fair  Insight:  Fair  Psychomotor Activity:  Normal  Concentration:  Concentration: Fair and Attention Span: Fair  Recall:  [employer] of Knowledge: Fair  Language: Fair  Akathisia:  No  Handed:  Right  AIMS (if indicated): na  Assets:  Communication Skills Desire for Improvement Housing Social Support Talents/Skills  ADL's:  Intact  Cognition: WNL  Sleep:  restless   Screenings: PHQ 9,GAD 7 as noted below AUDIT     Admission (Discharged) from 01/08/2018 in [facility]  Alcohol Use Disorder Identification Test Final Score (AUDIT)  0    PHQ2-9     Office Visit from 09/04/2017 in [place] Visit from 06/13/2016 in [facility]  PHQ-2 Total Score  3  1  PHQ-9 Total Score  17  11       Assessment and Plan: Takeem is a 52 year old Hispanic male who has a history of depression, anxiety, coronary artery disease status post bypass graft, hypertension, hyperlipidemia, diabetes mellitus, chronic pain, presented to the clinic today for a follow-up visit.  Patient recently had a suicide attempt when he impulsively took a few pills of Xanax, which he did in front of his family.  Patient was admitted to inpatient unit at [facility] and was discharged on October 3.  Patient today appears to be motivated to continue medications as well as psychotherapy.  Patient's daughter who presented to the clinic today also provided collateral information.  Patient currently denies any suicidality or homicidality, has good social support system and is motivated to continue  treatment as discussed below.  Patient also reports his pain as contributing to his mood problems as well as sleep issues.  Patient reports he does have follow-up appointments scheduled with his primary medical doctor for management of the same.  Continue plan as noted below.  Plan MDD Continue Zoloft 200 mg p.o. daily Increase Lamictal to 25 mg p.o. twice daily Add Seroquel 25 mg p.o. nightly to augment the Zoloft. PHQ 9 equals 19.  GAD Continue Zoloft 200 mg p.o. Daily GAD 7- 13 Add hydroxyzine 25-50 mg p.o. as needed for severe anxiety symptoms Continue psychotherapy with Ms. Alden Hipp.  Also discussed referral for IOP.  For insomnia Discontinue trazodone for lack of efficacy Start Seroquel 25 mg p.o. Nightly  We will get an EKG since he is currently on Seroquel- patient reports he will reach out to his PMD.  Crisis plan discussed with patient as well as daughter.  Patient to reach out to his primary medical doctor for management of his pain.  Discussed with patient to let writer know if he is unable to keep his follow-up visits scheduled in 2 weeks.  Patient as well as daughter voiced understanding.  Follow-up in 2 weeks or sooner if needed.  More than 50 % of the time was spent for psychoeducation and supportive psychotherapy and  care coordination.  This note was generated in part or whole with voice recognition software. Voice recognition is usually quite accurate but there are transcription errors that can and very often do occur. I apologize for any typographical errors that were not detected and corrected.       Ursula Alert, MD 01/11/2018, 11:59 AM

## 2018-01-11 NOTE — Telephone Encounter (Signed)
Pt's Daughter [name] called stating Dr. Shea Evans, pt's physiatrist has changed medication and is requesting pt have a EKG - baseline to check pt's QT at his visit w/ [facility] on  01-21-18.  Thanks, [employer]

## 2018-01-14 ENCOUNTER — Telehealth: Payer: Self-pay | Admitting: Psychiatry
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
## 2018-01-17 ENCOUNTER — Other Ambulatory Visit: Payer: Self-pay | Admitting: Family Medicine

## 2018-01-17 MED ORDER — DEXLANSOPRAZOLE 60 MG PO CPDR: 60.0000 mg | DELAYED_RELEASE_CAPSULE | Freq: Every day | ORAL | 12 refills | Status: DC

## 2018-01-17 NOTE — Telephone Encounter (Signed)
Pt's daughter [name] contacted office for refill request on the following medications:  dexlansoprazole (DEXILANT) 60 MG capsule  [facility] [place]  Daughter stated no longer using [facility] and need [facility]. Please advise. Thanks TNP

## 2018-01-17 NOTE — Telephone Encounter (Signed)
Please review. Pharmacy updated.Thanks!  

## 2018-01-21 ENCOUNTER — Ambulatory Visit (INDEPENDENT_AMBULATORY_CARE_PROVIDER_SITE_OTHER): Payer: BLUE CROSS/BLUE SHIELD | Admitting: Licensed Clinical Social Worker

## 2018-01-21 ENCOUNTER — Telehealth: Payer: Self-pay | Admitting: Family Medicine

## 2018-01-21 ENCOUNTER — Other Ambulatory Visit: Payer: Self-pay | Admitting: Family Medicine

## 2018-01-21 ENCOUNTER — Ambulatory Visit (INDEPENDENT_AMBULATORY_CARE_PROVIDER_SITE_OTHER): Payer: BLUE CROSS/BLUE SHIELD | Admitting: Family Medicine

## 2018-01-21 ENCOUNTER — Encounter: Payer: Self-pay | Admitting: Licensed Clinical Social Worker

## 2018-01-21 ENCOUNTER — Encounter: Payer: Self-pay | Admitting: Family Medicine

## 2018-01-21 VITALS — BP 122/70 | HR 64 | Temp 98.4°F | Resp 16 | Wt 204.0 lb

## 2018-01-21 DIAGNOSIS — M545 Low back pain: Secondary | ICD-10-CM | POA: Diagnosis not present

## 2018-01-21 DIAGNOSIS — F419 Anxiety disorder, unspecified: Secondary | ICD-10-CM | POA: Diagnosis not present

## 2018-01-21 DIAGNOSIS — K5909 Other constipation: Secondary | ICD-10-CM

## 2018-01-21 DIAGNOSIS — Z23 Encounter for immunization: Secondary | ICD-10-CM | POA: Diagnosis not present

## 2018-01-21 DIAGNOSIS — F331 Major depressive disorder, recurrent, moderate: Secondary | ICD-10-CM

## 2018-01-21 DIAGNOSIS — I2510 Atherosclerotic heart disease of native coronary artery without angina pectoris: Secondary | ICD-10-CM | POA: Diagnosis not present

## 2018-01-21 DIAGNOSIS — F3341 Major depressive disorder, recurrent, in partial remission: Secondary | ICD-10-CM

## 2018-01-21 DIAGNOSIS — G8929 Other chronic pain: Secondary | ICD-10-CM

## 2018-01-21 DIAGNOSIS — E1165 Type 2 diabetes mellitus with hyperglycemia: Secondary | ICD-10-CM

## 2018-01-21 MED ORDER — OXYCODONE HCL 15 MG PO TABS: 15.0000 mg | ORAL_TABLET | ORAL | 0 refills | Status: DC | PRN

## 2018-01-21 MED ORDER — LUBIPROSTONE 24 MCG PO CAPS: 24.0000 ug | ORAL_CAPSULE | Freq: Two times a day (BID) | ORAL | 3 refills | Status: DC
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
## 2018-01-21 NOTE — Telephone Encounter (Signed)
[facility] [place] [facility] not [facility] any of the oxycodone 15 mg in stock.  They may have some Thursday.   He wants to know if he can come by and pick paper rx from you to take to another pharmacy  Pt's CB#  [phone]  Thanks  Con Memos

## 2018-01-21 NOTE — Progress Notes (Signed)
Patient: Vincent Hall Male    DOB: 1966/02/02   52 y.o.   MRN: [id number] Visit Date: 01/21/2018  Today's Provider: [name], MD   Chief Complaint  Patient presents with  . Hospitalization Follow-up   Subjective:    Depression         This is a chronic problem.  The problem has been gradually improving since onset.  Associated symptoms include fatigue, helplessness, hopelessness, insomnia, decreased interest, appetite change and sad.  Associated symptoms include no decreased concentration, no myalgias and no headaches. Constipation  This is a chronic problem. The patient is on a high fiber diet. He does not exercise regularly. There has been adequate water intake. Associated symptoms include back pain. Pertinent negatives include no abdominal pain, diarrhea, fever, nausea, rectal pain or vomiting.       Allergies  Allergen Reactions  . Montelukast Hives    Singular  . Depakote [Divalproex Sodium] Rash  . Singulair [Montelukast Sodium] Hives  . Atorvastatin Other (See Comments)    Muscle pain, neck stiffness     Current Outpatient Medications:  .  albuterol (PROVENTIL HFA;VENTOLIN HFA) 108 (90 Base) MCG/ACT inhaler, Inhale 2 puffs into the lungs every 6 (six) hours as needed for wheezing or shortness of breath., Disp: 1 Inhaler, Rfl: 3 .  aspirin 81 MG tablet, Take 81 mg by mouth daily., Disp: , Rfl:  .  azelastine (ASTELIN) 0.1 % nasal spray, Place 2 sprays into both nostrils 2 (two) times daily., Disp: 30 mL, Rfl: 6 .  azelastine (OPTIVAR) 0.05 % ophthalmic solution, drop affect eye twice a day, Disp: 6 mL, Rfl: 3 .  Blood Glucose Monitoring Suppl (ONE TOUCH ULTRA SYSTEM KIT) w/Device KIT, 1 kit by Does not apply route once., Disp: 1 each, Rfl: 0 .  dexlansoprazole (DEXILANT) 60 MG capsule, Take 1 capsule (60 mg total) by mouth daily., Disp: 30 capsule, Rfl: 12 .  glipiZIDE (GLUCOTROL XL) 5 MG 24 hr tablet, Take 5 mg by mouth daily with breakfast.,  Disp: , Rfl:  .  glucose blood (ONE TOUCH ULTRA TEST) test strip, Check blood sugar 3 times daily E11.65 (uncontrolled insulin dependent diabetes), Disp: 100 each, Rfl: 12 .  hydrOXYzine (VISTARIL) 25 MG capsule, Take 1-2 capsules (25-50 mg total) by mouth as directed. Only for severe anxiety symptoms - 2-3 times a week., Disp: 60 capsule, Rfl: 1 .  INVOKAMET 150-500 MG TABS, TAKE 1 TABLET BY MOUTH TWICE DAILY, Disp: 60 tablet, Rfl: 5 .  lamoTRIgine (LAMICTAL) 25 MG tablet, Take 1 tablet (25 mg total) by mouth 2 (two) times daily., Disp: 60 tablet, Rfl: 0 .  mometasone (ELOCON) 0.1 % cream, One application daily as needed for rash, Disp: 45 g, Rfl: 1 .  nitroGLYCERIN (NITROSTAT) 0.4 MG SL tablet, Place 0.4 mg under the tongue every 5 (five) minutes as needed for chest pain. , Disp: , Rfl:  .  nystatin (MYCOSTATIN) 100000 UNIT/ML suspension, Take 5 mLs (500,000 Units total) by mouth 4 (four) times daily., Disp: 60 mL, Rfl: 0 .  oxyCODONE-acetaminophen (PERCOCET) 10-325 MG tablet, Take 1 tablet by mouth every 6 (six) hours as needed for pain., Disp: 60 tablet, Rfl: 0 .  pioglitazone (ACTOS) 15 MG tablet, Take 1 tablet (15 mg total) by mouth daily., Disp: 30 tablet, Rfl: 4 .  QUEtiapine (SEROQUEL) 25 MG tablet, Take 1 tablet (25 mg total) by mouth at bedtime. For mood and sleep, Disp: 30 tablet, Rfl: 0 .  RABEprazole (ACIPHEX) 20 MG tablet, Take 20 mg by mouth daily., Disp: , Rfl:  .  ranitidine (ZANTAC) 150 MG capsule, Take 150 mg by mouth as needed for heartburn., Disp: , Rfl:  .  rosuvastatin (CRESTOR) 40 MG tablet, TAKE 1 TABLET(40 MG) BY MOUTH DAILY, Disp: 30 tablet, Rfl: 11 .  sertraline (ZOLOFT) 100 MG tablet, Take 2 tablets (200 mg total) by mouth daily., Disp: 60 tablet, Rfl: 1  Review of Systems  Constitutional: Positive for activity change, appetite change and fatigue. Negative for chills, diaphoresis, fever and unexpected weight change.  Respiratory: Negative.   Cardiovascular: Negative.    Gastrointestinal: Positive for constipation. Negative for abdominal distention, abdominal pain, anal bleeding, blood in stool, diarrhea, nausea, rectal pain and vomiting.  Musculoskeletal: Positive for arthralgias, back pain and joint swelling. Negative for gait problem, myalgias, neck pain and neck stiffness.  Neurological: Negative for dizziness and headaches.  Psychiatric/Behavioral: Positive for depression, dysphoric mood and sleep disturbance. Negative for agitation, behavioral problems, confusion, decreased concentration and hallucinations. The patient is nervous/anxious and has insomnia. The patient is not hyperactive.     Social History   Tobacco Use  . Smoking status: Former Smoker    Packs/day: 0.25    Years: 30.00    Pack years: 7.50    Types: Cigarettes    Last attempt to quit: 08/08/2013    Years since quitting: 4.4  . Smokeless tobacco: Never Used  Substance Use Topics  . Alcohol use: No   Objective:   BP 122/70 (BP Location: Left Arm, Patient Position: Sitting, Cuff Size: Large)   Pulse 64   Temp 98.4 F (36.9 C) (Oral)   Resp 16   Wt 204 lb (92.5 kg)   BMI 31.95 kg/m     Physical Exam   General Appearance:    Alert, cooperative, no distress  Eyes:    PERRL, conjunctiva/corneas clear, EOM's intact       Lungs:     Clear to auscultation bilaterally, respirations unlabored  Heart:    Regular rate and rhythm  Neurologic:   Awake, alert, oriented x 3. No apparent focal neurological           defect.           Assessment & Plan:     1. Coronary artery disease involving native coronary artery of native heart without angina pectoris Asymptomatic. Compliant with medication.  Continue aggressive risk factor modification. .  - EKG 12-Lead  2. Need for influenza vaccination  - Flu Vaccine QUAD 6+ mos PF IM (Fluarix Quad PF)  3. Chronic midline low back pain without sciatica Counseled that dependence on opioid will likely exacerbate his anxiety and depression  over time. Will refer pain clinic to evaluate for alternate treatment options. Continue current dose of oxycodone/apap for the time being.  - Ambulatory referral to Pain Clinic  4. Anxiety Doing much better since recent hospitalization. He requested that I refill his anxiety medications today, but advised that was what the hydroxyzine was prescribed for by psychiatrist. He has follow up scheduled this week and will need to discuss medications with her.   5. Recurrent major depressive disorder, in partial remission ([place]) Doing much better. Continue current medications.  Follow up with psychiatry as scheduled.   6. Chronic constipation He states that he used to have prescription for Linzess for this and requests new prescription. It looks like lubiprostone is preferred on his formulary so will try that first.  - lubiprostone (AMITIZA) 24  MCG capsule; Take 1 capsule (24 mcg total) by mouth 2 (two) times daily with a meal. For constipation  Dispense: 60 capsule; Refill: 3       [name], MD  [facility]

## 2018-01-21 NOTE — Patient Instructions (Signed)
   Take one extra strength tylenol with each dose of oxycodone, but don't take any more then 6 tablets in a day

## 2018-01-23 ENCOUNTER — Ambulatory Visit: Payer: BLUE CROSS/BLUE SHIELD | Admitting: Psychiatry

## 2018-01-23 ENCOUNTER — Ambulatory Visit: Payer: BLUE CROSS/BLUE SHIELD | Admitting: Licensed Clinical Social Worker

## 2018-01-25 DIAGNOSIS — F319 Bipolar disorder, unspecified: Secondary | ICD-10-CM | POA: Insufficient documentation

## 2018-01-25 DIAGNOSIS — F329 Major depressive disorder, single episode, unspecified: Secondary | ICD-10-CM | POA: Insufficient documentation

## 2018-01-25 DIAGNOSIS — F32A Depression, unspecified: Secondary | ICD-10-CM | POA: Insufficient documentation

## 2018-01-31 ENCOUNTER — Telehealth: Payer: Self-pay | Admitting: Psychiatry

## 2018-01-31 NOTE — Telephone Encounter (Signed)
Patient missed his appointment with writer as well as therapist last week. Lea tried to reach out to him and his wife and left messages to call us back. Per staff he has not reached back to reschedule. Will ask Janett Billow CMA to contact patient and wife to schedule an appointment , if he is not compliant he will be discharged from  Clinic.

## 2018-02-01 ENCOUNTER — Telehealth: Payer: Self-pay | Admitting: Family Medicine

## 2018-02-01 ENCOUNTER — Other Ambulatory Visit: Payer: Self-pay | Admitting: Family Medicine

## 2018-02-01 ENCOUNTER — Telehealth: Payer: Self-pay | Admitting: Psychiatry

## 2018-02-01 MED ORDER — PANTOPRAZOLE SODIUM 40 MG PO TBEC: 40.0000 mg | DELAYED_RELEASE_TABLET | Freq: Every day | ORAL | 3 refills | Status: DC

## 2018-02-01 NOTE — Telephone Encounter (Signed)
Pt stated the out of pocket cost is 80$ for this medication and is requesting an Rx for Pantoprazole be sent to [facility] because it will only cost 10$. Please advise. Thanks TNP

## 2018-02-01 NOTE — Telephone Encounter (Signed)
Pt not compliant with treatment recommendations.

## 2018-02-02 ENCOUNTER — Other Ambulatory Visit: Payer: Self-pay | Admitting: Psychiatry

## 2018-02-02 NOTE — Telephone Encounter (Signed)
Pt was called on  02-02-18 @ 8::55am left message to call office back

## 2018-02-03 ENCOUNTER — Other Ambulatory Visit: Payer: Self-pay | Admitting: Psychiatry

## 2018-02-04 NOTE — Telephone Encounter (Signed)
OK, If patient does not return call , please discharge patient.

## 2018-02-06 ENCOUNTER — Other Ambulatory Visit: Payer: Self-pay

## 2018-02-06 ENCOUNTER — Other Ambulatory Visit: Payer: Self-pay | Admitting: Psychiatry

## 2018-02-06 MED ORDER — CANAGLIFLOZIN-METFORMIN HCL 150-500 MG PO TABS: 1.0000 | ORAL_TABLET | Freq: Two times a day (BID) | ORAL | 12 refills | Status: DC

## 2018-02-06 NOTE — Telephone Encounter (Signed)
Patient called office requesting refill on  1)rabebrazole 2) Invokamet  Sent over to [facility]. KW

## 2018-02-11 ENCOUNTER — Other Ambulatory Visit: Payer: Self-pay | Admitting: Family Medicine

## 2018-02-11 DIAGNOSIS — M545 Low back pain: Principal | ICD-10-CM

## 2018-02-11 DIAGNOSIS — G8929 Other chronic pain: Secondary | ICD-10-CM

## 2018-02-11 DIAGNOSIS — T1491XA Suicide attempt, initial encounter: Secondary | ICD-10-CM

## 2018-02-11 DIAGNOSIS — F332 Major depressive disorder, recurrent severe without psychotic features: Secondary | ICD-10-CM

## 2018-02-11 MED ORDER — OXYCODONE HCL 15 MG PO TABS: 15.0000 mg | ORAL_TABLET | ORAL | 0 refills | Status: DC | PRN

## 2018-02-11 NOTE — Telephone Encounter (Signed)
Pt wanting a hard copy of the refill for: oxyCODONE (ROXICODONE) 15 MG immediate release tablet  Please call pt when he can come by to pick up the hard copy.  Thanks, [employer]

## 2018-02-11 NOTE — Telephone Encounter (Signed)
Pt is requesting a hard copy.  He states he has to go to several pharmacies because it is always on back order.  Please advise.   Thanks,   -Mickel Baas

## 2018-02-11 NOTE — Progress Notes (Signed)
DISMISSED BY [facility] DUE TO NO SHOW

## 2018-02-13 ENCOUNTER — Telehealth: Payer: Self-pay | Admitting: Family Medicine

## 2018-02-19 ENCOUNTER — Ambulatory Visit: Payer: BLUE CROSS/BLUE SHIELD | Admitting: Psychiatry

## 2018-02-21 ENCOUNTER — Ambulatory Visit: Payer: BLUE CROSS/BLUE SHIELD | Admitting: Licensed Clinical Social Worker

## 2018-02-22 ENCOUNTER — Encounter: Payer: Self-pay | Admitting: Family Medicine

## 2018-02-22 ENCOUNTER — Ambulatory Visit: Payer: Self-pay | Admitting: Family Medicine

## 2018-02-22 ENCOUNTER — Ambulatory Visit (INDEPENDENT_AMBULATORY_CARE_PROVIDER_SITE_OTHER): Payer: BLUE CROSS/BLUE SHIELD | Admitting: Family Medicine

## 2018-02-22 VITALS — BP 118/72 | HR 63 | Temp 98.5°F | Resp 16 | Wt 202.0 lb

## 2018-02-22 DIAGNOSIS — E1165 Type 2 diabetes mellitus with hyperglycemia: Secondary | ICD-10-CM

## 2018-02-22 DIAGNOSIS — G4700 Insomnia, unspecified: Secondary | ICD-10-CM | POA: Diagnosis not present

## 2018-02-22 DIAGNOSIS — K5909 Other constipation: Secondary | ICD-10-CM | POA: Diagnosis not present

## 2018-02-22 DIAGNOSIS — F332 Major depressive disorder, recurrent severe without psychotic features: Secondary | ICD-10-CM

## 2018-02-22 DIAGNOSIS — M549 Dorsalgia, unspecified: Secondary | ICD-10-CM

## 2018-02-22 MED ORDER — NALOXEGOL OXALATE 25 MG PO TABS: 25.0000 mg | ORAL_TABLET | Freq: Every day | ORAL | 3 refills | Status: DC

## 2018-02-22 MED ORDER — CYCLOBENZAPRINE HCL 5 MG PO TABS: 5.0000 mg | ORAL_TABLET | Freq: Three times a day (TID) | ORAL | 1 refills | Status: DC | PRN

## 2018-02-22 MED ORDER — PIOGLITAZONE HCL 15 MG PO TABS: 15.0000 mg | ORAL_TABLET | Freq: Every day | ORAL | 12 refills | Status: DC

## 2018-02-22 MED ORDER — NALOXEGOL OXALATE 25 MG PO TABS: 25.0000 mg | ORAL_TABLET | Freq: Every day | ORAL | 0 refills | Status: DC

## 2018-02-22 MED ORDER — TRAZODONE HCL 100 MG PO TABS: 200.0000 mg | ORAL_TABLET | Freq: Every day | ORAL | 5 refills | Status: DC

## 2018-02-22 NOTE — Progress Notes (Signed)
Patient: Vincent Hall Male    DOB: 1965/06/05   52 y.o.   MRN: [id number] Visit Date: 02/22/2018  Today's Provider: [name], MD   Chief Complaint  Patient presents with  . Follow-up   Subjective:    HPI Chronic midline low back pain without sciatica: Patient was last seen for this problem 1 month ago. Patient was referred to pain clinic during last visit to evaluate for alternative treatment options. Patient has an appointment scheduled with the pain clinic on 02/26/2018 with Dr Holley Raring. He states he does get some tightness in his back and shoulder and request prescription for muscle relaxer. States he has done well with cyclobenzaprine in the past.   Chronic constipation: Patient was last seen for this problem 1 month ago. Treatment during that visit includes starting Amitiza. Today patient comes in reporting no improvement of constipation since starting Amitiza.   Anxiety and Depression: Patient was last seen for this problem 1 months ago and no changes were made. Patient was advised to continue to follow up with psychiatry. Since last visit, patient had been discharged from psychiatry due to no-shows. Patient states he has an appointment to establish with a new psychiatrist in December. States he is doing well with current medications.     Allergies  Allergen Reactions  . Montelukast Hives    Singular  . Depakote [Divalproex Sodium] Rash  . Singulair [Montelukast Sodium] Hives  . Atorvastatin Other (See Comments)    Muscle pain, neck stiffness     Current Outpatient Medications:  .  albuterol (PROVENTIL HFA;VENTOLIN HFA) 108 (90 Base) MCG/ACT inhaler, Inhale 2 puffs into the lungs every 6 (six) hours as needed for wheezing or shortness of breath., Disp: 1 Inhaler, Rfl: 3 .  aspirin 81 MG tablet, Take 81 mg by mouth daily., Disp: , Rfl:  .  azelastine (ASTELIN) 0.1 % nasal spray, Place 2 sprays into both nostrils 2 (two) times daily., Disp: 30 mL, Rfl:  6 .  azelastine (OPTIVAR) 0.05 % ophthalmic solution, drop affect eye twice a day, Disp: 6 mL, Rfl: 3 .  Blood Glucose Monitoring Suppl (ONE TOUCH ULTRA SYSTEM KIT) w/Device KIT, 1 kit by Does not apply route once., Disp: 1 each, Rfl: 0 .  Canagliflozin-metFORMIN HCl (INVOKAMET) 150-500 MG TABS, Take 1 tablet by mouth 2 (two) times daily., Disp: 60 tablet, Rfl: 12 .  dexlansoprazole (DEXILANT) 60 MG capsule, Take 1 capsule (60 mg total) by mouth daily., Disp: 30 capsule, Rfl: 12 .  glipiZIDE (GLUCOTROL XL) 5 MG 24 hr tablet, Take 5 mg by mouth daily with breakfast., Disp: , Rfl:  .  glucose blood (ONE TOUCH ULTRA TEST) test strip, Check blood sugar 3 times daily E11.65 (uncontrolled insulin dependent diabetes), Disp: 100 each, Rfl: 12 .  hydrOXYzine (VISTARIL) 25 MG capsule, Take 1-2 capsules (25-50 mg total) by mouth as directed. Only for severe anxiety symptoms - 2-3 times a week., Disp: 60 capsule, Rfl: 1 .  lamoTRIgine (LAMICTAL) 25 MG tablet, Take 1 tablet (25 mg total) by mouth 2 (two) times daily., Disp: 60 tablet, Rfl: 0 .  lubiprostone (AMITIZA) 24 MCG capsule, Take 1 capsule (24 mcg total) by mouth 2 (two) times daily with a meal. For constipation, Disp: 60 capsule, Rfl: 3 .  mometasone (ELOCON) 0.1 % cream, One application daily as needed for rash, Disp: 45 g, Rfl: 1 .  nitroGLYCERIN (NITROSTAT) 0.4 MG SL tablet, Place 0.4 mg under the tongue every 5 (  five) minutes as needed for chest pain. , Disp: , Rfl:  .  nystatin (MYCOSTATIN) 100000 UNIT/ML suspension, Take 5 mLs (500,000 Units total) by mouth 4 (four) times daily., Disp: 60 mL, Rfl: 0 .  oxyCODONE (ROXICODONE) 15 MG immediate release tablet, Take 1 tablet (15 mg total) by mouth every 4 (four) hours as needed for pain., Disp: 120 tablet, Rfl: 0 .  pantoprazole (PROTONIX) 40 MG tablet, Take 1 tablet (40 mg total) by mouth daily., Disp: 30 tablet, Rfl: 3 .  pioglitazone (ACTOS) 15 MG tablet, TAKE 1 TABLET BY MOUTH EVERY DAY, Disp: 30  tablet, Rfl: 12 .  QUEtiapine (SEROQUEL) 25 MG tablet, TAKE 1 TABLET (25 MG TOTAL) BY MOUTH AT BEDTIME. FOR MOOD AND SLEEP, Disp: 30 tablet, Rfl: 0 .  ranitidine (ZANTAC) 150 MG capsule, Take 150 mg by mouth as needed for heartburn., Disp: , Rfl:  .  rosuvastatin (CRESTOR) 40 MG tablet, TAKE 1 TABLET(40 MG) BY MOUTH DAILY, Disp: 30 tablet, Rfl: 11 .  sertraline (ZOLOFT) 100 MG tablet, Take 2 tablets (200 mg total) by mouth daily., Disp: 60 tablet, Rfl: 1 .  traZODone (DESYREL) 100 MG tablet, Take 200 mg by mouth at bedtime., Disp: , Rfl:   Review of Systems  Constitutional: Negative for appetite change, chills and fever.  Respiratory: Negative for chest tightness, shortness of breath and wheezing.   Cardiovascular: Negative for chest pain and palpitations.  Gastrointestinal: Negative for abdominal pain, nausea and vomiting.    Social History   Tobacco Use  . Smoking status: Former Smoker    Packs/day: 0.25    Years: 30.00    Pack years: 7.50    Types: Cigarettes    Last attempt to quit: 08/08/2013    Years since quitting: 4.5  . Smokeless tobacco: Never Used  Substance Use Topics  . Alcohol use: No   Objective:   BP 118/72 (BP Location: Left Arm, Patient Position: Sitting, Cuff Size: Large)   Pulse 63   Temp 98.5 F (36.9 C) (Oral)   Resp 16   Wt 202 lb (91.6 kg)   SpO2 99% Comment: room air  BMI 31.64 kg/m  Vitals:   02/22/18 1047  BP: 118/72  Pulse: 63  Resp: 16  Temp: 98.5 F (36.9 C)  TempSrc: Oral  SpO2: 99%  Weight: 202 lb (91.6 kg)     Physical Exam   General Appearance:    Alert, cooperative, no distress  Eyes:    PERRL, conjunctiva/corneas clear, EOM's intact       Lungs:     Clear to auscultation bilaterally, respirations unlabored  Heart:    Regular rate and rhythm  Neurologic:   Awake, alert, oriented x 3. No apparent focal neurological           defect.          Assessment & Plan:     1. Chronic constipation No relief from Amitiza. Try  samples with prescription for- naloxegol oxalate (MOVANTIK) 25 MG TABS tablet; Take 1 tablet (25 mg total) by mouth daily with breakfast.  Dispense: 30 tablet; Refill: 3  2. Severe recurrent major depression without psychotic features (HCC) Stable on chronic medications. Follow up with new psychiatrist as scheduled.   3. Insomnia, unspecified type He reports trazodone working well. refill- traZODone (DESYREL) 100 MG tablet; Take 2 tablets (200 mg total) by mouth at bedtime.  Dispense: 60 tablet; Refill: 5  4. Uncontrolled type 2 diabetes mellitus with hyperglycemia ([place]) He needs refill -  pioglitazone (ACTOS) 15 MG tablet; Take 1 tablet (15 mg total) by mouth daily.  Dispense: 30 tablet; Refill: 12 Check a1c at follow up.   5. Bilateral back pain, unspecified back location, unspecified chronicity  - cyclobenzaprine (FLEXERIL) 5 MG tablet; Take 1 tablet (5 mg total) by mouth 3 (three) times daily as needed for muscle spasms.  Dispense: 30 tablet; Refill: 1        [name], MD  [facility]

## 2018-02-22 NOTE — Patient Instructions (Signed)
   Start taking Movantik 25mg , one tablet at breakfast time every day. You can take printed to pharmacy to fill if this is helping   Take OTC metamucil fiber supplement every day

## 2018-02-26 ENCOUNTER — Encounter: Payer: Self-pay | Admitting: Student in an Organized Health Care Education/Training Program

## 2018-02-26 ENCOUNTER — Telehealth: Payer: Self-pay | Admitting: *Deleted

## 2018-02-26 ENCOUNTER — Ambulatory Visit
Payer: BLUE CROSS/BLUE SHIELD | Attending: Student in an Organized Health Care Education/Training Program | Admitting: Student in an Organized Health Care Education/Training Program

## 2018-02-26 ENCOUNTER — Other Ambulatory Visit: Payer: Self-pay

## 2018-02-26 VITALS — BP 113/69 | HR 66 | Temp 98.4°F | Resp 16 | Ht 68.0 in | Wt 190.0 lb

## 2018-02-26 DIAGNOSIS — Z951 Presence of aortocoronary bypass graft: Secondary | ICD-10-CM | POA: Diagnosis not present

## 2018-02-26 DIAGNOSIS — G894 Chronic pain syndrome: Secondary | ICD-10-CM | POA: Insufficient documentation

## 2018-02-26 DIAGNOSIS — M545 Low back pain, unspecified: Secondary | ICD-10-CM

## 2018-02-26 DIAGNOSIS — Z87891 Personal history of nicotine dependence: Secondary | ICD-10-CM | POA: Insufficient documentation

## 2018-02-26 DIAGNOSIS — F321 Major depressive disorder, single episode, moderate: Secondary | ICD-10-CM

## 2018-02-26 DIAGNOSIS — I252 Old myocardial infarction: Secondary | ICD-10-CM | POA: Insufficient documentation

## 2018-02-26 DIAGNOSIS — M5136 Other intervertebral disc degeneration, lumbar region: Secondary | ICD-10-CM | POA: Diagnosis not present

## 2018-02-26 DIAGNOSIS — M47816 Spondylosis without myelopathy or radiculopathy, lumbar region: Secondary | ICD-10-CM | POA: Insufficient documentation

## 2018-02-26 DIAGNOSIS — K219 Gastro-esophageal reflux disease without esophagitis: Secondary | ICD-10-CM | POA: Diagnosis not present

## 2018-02-26 DIAGNOSIS — I2510 Atherosclerotic heart disease of native coronary artery without angina pectoris: Secondary | ICD-10-CM | POA: Diagnosis not present

## 2018-02-26 DIAGNOSIS — E782 Mixed hyperlipidemia: Secondary | ICD-10-CM | POA: Diagnosis not present

## 2018-02-26 DIAGNOSIS — R0789 Other chest pain: Secondary | ICD-10-CM | POA: Diagnosis not present

## 2018-02-26 DIAGNOSIS — G8929 Other chronic pain: Secondary | ICD-10-CM

## 2018-02-26 MED ORDER — DIAZEPAM 2 MG PO TABS: 2.0000 mg | ORAL_TABLET | Freq: Once | ORAL | 0 refills | Status: DC | PRN

## 2018-02-26 MED ORDER — GABAPENTIN 300 MG PO CAPS: 300.0000 mg | ORAL_CAPSULE | Freq: Three times a day (TID) | ORAL | 2 refills | Status: DC

## 2018-02-26 NOTE — Patient Instructions (Signed)
Gabapentin has been escribed to your pharmacy.  You will have MRI prior to next appt. With pain clinic.

## 2018-02-26 NOTE — Telephone Encounter (Signed)
Rx for Valium sent in, 1 tablet only to be taken prior to MRI

## 2018-02-26 NOTE — Progress Notes (Signed)
Safety precautions to be maintained throughout the outpatient stay will include: orient to surroundings, keep bed in low position, maintain call bell within reach at all times, provide assistance with transfer out of bed and ambulation.  

## 2018-02-26 NOTE — Progress Notes (Signed)
Patient's Name: Vincent Hall  MRN: [id number]  Referring Provider: Birdie Sons, MD  DOB: 1966/02/02  PCP: Birdie Sons, MD  DOS: 02/26/2018  Note by: Gillis Santa, MD  Service setting: Ambulatory outpatient  Specialty: Interventional Pain Management  Location: [facility] (AMB) Pain Management Facility  Visit type: Initial Patient Evaluation  Patient type: New Patient   Primary Reason(s) for Visit: Encounter for initial evaluation of one or more chronic problems (new to examiner) potentially causing chronic pain, and posing a threat to normal musculoskeletal function. (Level of risk: High) CC: Back Pain; Neck Pain; Hip Pain; and Chest Pain  HPI  Mr. Hartland is a 52 y.o. year old, male patient, who comes today to see [place] for the first time for an initial evaluation of his chronic pain. He has Coronary artery disease; Chronic constipation; Esophageal reflux; Esophageal spasm; Allergic rhinitis; Anxiety; Arthralgia; Chest wall pain; Dysphagia; Fatigue; History of tobacco use; Hyperlipidemia, mixed; Insomnia; Current moderate episode of major depressive disorder without prior episode [facility]); STEMI (ST elevation myocardial infarction) ([place]); Thrush, oral; Suicide attempt ([place]); Benzodiazepine overdose; Severe recurrent major depression without psychotic features ([place]); Chronic pain; and Depression on their problem list. Today he comes in for evaluation of his Back Pain; Neck Pain; Hip Pain; and Chest Pain  Pain Assessment: Location: Lower, Right, Left Back Radiating: radiates into both hips Onset: More than a month ago Duration: Chronic pain Quality: Restless, Radiating, Constant, Aching Severity: 8 /10 (subjective, self-reported pain score)  Note: Reported level is inconsistent with clinical observations. Clinically the patient looks like a 3/10 A 3/10 is viewed as "Moderate" and described as significantly interfering with activities of daily living (ADL). It becomes difficult to feed,  bathe, get dressed, get on and off the toilet or to perform personal hygiene functions. Difficult to get in and out of bed or a chair without assistance. Very distracting. With effort, it can be ignored when deeply involved in activities. Information on the proper use of the pain scale provided to the patient today. When using our objective Pain Scale, levels between 6 and 10/10 are said to belong in an emergency room, as it progressively worsens from a 6/10, described as severely limiting, requiring emergency care not usually available at an outpatient pain management facility. At a 6/10 level, communication becomes difficult and requires great effort. Assistance to reach the emergency department may be required. Facial flushing and profuse sweating along with potentially dangerous increases in heart rate and blood pressure will be evident. Effect on ADL: "I  cant do anything" Timing: Constant Modifying factors: changing positions, medications BP: 113/69  HR: 66  Onset and Duration: Gradual and Present longer than 3 months Cause of pain: Surgery Severity: Getting worse, NAS-11 at its worse: 10/10, NAS-11 at its best: 2/10 and NAS-11 on the average: 8/10 Timing: During activity or exercise Aggravating Factors: Bending, Bowel movements, Climbing, Eating, Intercourse (sex), Kneeling, Lifiting, Motion, Nerve blocks, Prolonged sitting, Prolonged standing, Squatting, Stooping , Surgery made it worse, Twisting, Walking, Walking uphill, Walking downhill and Working Alleviating Factors: none listed Associated Problems: Constipation, Day-time cramps, Night-time cramps, Depression, Dizziness, Erectile dysfunction, Fatigue, Impotence, Inability to concentrate, Inability to control bladder (urine), Inability to control bowel, Nausea, Numbness, Personality changes, Sadness, Spasms, Suicidal ideations, Sweating, Swelling, Temperature changes, Tingling, Vomiting , Weakness, Pain that wakes patient up and Pain that  does not allow patient to sleep Quality of Pain: Disabling Previous Examinations or Tests: MRI scan, X-rays and Psychiatric evaluation Previous Treatments: Narcotic medications  The patient comes into the clinics today for the first time for a chronic pain management evaluation.   52 year old gentleman who presents with a history of chronic pain localized to his chest and upper thoracic region as well as axial low back pain that radiates into bilateral hips.  Of note patient had triple coronary artery bypass surgery in 2015.  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  Patient states that he has pain with even basic movements such as raising his hands overhead or doing basic chores that results in severe pain along his sternotomy site as well as his clavicle region.  The pain also radiates posteriorly.  Patient states that this started after his sternotomy from his CABG.  He states that this has significantly changed his life around.  He also struggles with bipolar disorder, depression, severe anxiety.  There is history of suicide attempt in his medical record but the patient did not elaborate on this when asked.  Previous imaging only consists of lumbar spine x-rays which show lumbar degenerative disc disease.  Regards to current medications, patient is on oxycodone 15 mg up to 4 times a day as needed, MME equals 135.  Patient also takes Flexeril as needed.  Today I took the time to provide the patient with information regarding my pain practice. The patient was informed that my practice is divided into two sections: an interventional pain management section, as well as a completely separate and distinct medication management section. I explained that I have procedure days for my interventional therapies, and evaluation days for follow-ups and medication management. Because of the amount of documentation required during both, they are kept separated. This means that there is the  possibility that he may be scheduled for a procedure on one day, and medication management the next. I have also informed him that because of staffing and facility limitations, I no longer take patients for medication management only. To illustrate the reasons for this, I gave the patient the example of surgeons, and how inappropriate it would be to refer a patient to his/her care, just to write for the post-surgical antibiotics on a surgery done by a different surgeon.   Because interventional pain management is my board-certified specialty, the patient was informed that joining my practice means that they are open to any and all interventional therapies. I made it clear that this does not mean that they will be forced to have any procedures done. What this means is that I believe interventional therapies to be essential part of the diagnosis and proper management of chronic pain conditions. Therefore, patients not interested in these interventional alternatives will be better served under the care of a different practitioner.  The patient was also made aware of my Comprehensive Pain Management Safety Guidelines where by joining my practice, they limit all of their nerve blocks and joint injections to those done by our practice, for as long as we are retained to manage their care.   Historic Controlled Substance Pharmacotherapy Review  PMP and historical list of controlled substances: Oxycodone 15 mg, quantity 120, last fill 02/13/2018 MME/day: 120 mg/day Medications: The patient did not bring the medication(s) to the appointment, as requested in our "New Patient Package" Pharmacodynamics: Desired effects: Analgesia: The patient reports <50% benefit. Reported improvement in function: The patient reports medication allows him to accomplish basic ADLs. Clinically meaningful improvement in function (CMIF): Sustained CMIF goals met Perceived effectiveness: Described as relatively effective but with some  room for  improvement Undesirable effects: Side-effects or Adverse reactions: None reported Historical Monitoring: The patient  reports that he does not use drugs. List of all UDS Test(s): Lab Results  Component Value Date   MDMA NONE DETECTED 01/08/2018   COCAINSCRNUR NONE DETECTED 01/08/2018   PCPSCRNUR NONE DETECTED 01/08/2018   THCU NONE DETECTED 01/08/2018   ETH <10 01/08/2018   List of other Serum/Urine Drug Screening Test(s):  Lab Results  Component Value Date   COCAINSCRNUR NONE DETECTED 01/08/2018   THCU NONE DETECTED 01/08/2018   ETH <10 01/08/2018   Historical Background Evaluation: [place] PMP: Six (6) year initial data search conducted.             [place] Department of public safety, offender search: Editor, commissioning Information) Non-contributory Risk Assessment Profile: Aberrant behavior: None observed or detected today Risk factors for fatal opioid overdose: age 80-60 years old and Severe anxiety, depression, history of previous suicide attempt.  Was seeing psychiatrist in the past however due to no shows he was dropped.  Is hoping to get in with a psychiatrist in December. Fatal overdose hazard ratio (HR): 2.04 for doses equal to, or higher than 100 MME/day Non-fatal overdose hazard ratio (HR): 8.87 for 100-199 MME/day Risk of opioid abuse or dependence: 0.7-3.0% with doses ? 36 MME/day and 6.1-26% with doses ? 120 MME/day. Substance use disorder (SUD) risk level: Moderate Personal History of Substance Abuse (SUD-Substance use disorder):  Alcohol: Negative  Illegal Drugs: Negative  Rx Drugs: Negative  ORT Risk Level calculation: Low Risk Opioid Risk Tool - 02/26/18 1111      Family History of Substance Abuse   Alcohol  Negative    Illegal Drugs  Negative    Rx Drugs  Negative      Personal History of Substance Abuse   Alcohol  Negative    Illegal Drugs  Negative    Rx Drugs  Negative      Age   Age between 20-45 years   No      History of Preadolescent Sexual Abuse    History of Preadolescent Sexual Abuse  Negative or Male      Psychological Disease   Psychological Disease  Positive    Bipolar  Positive    Depression  Positive      Total Score   Opioid Risk Tool Scoring  3    Opioid Risk Interpretation  Low Risk      ORT Scoring interpretation table:  Score <3 = Low Risk for SUD  Score between 4-7 = Moderate Risk for SUD  Score >8 = High Risk for Opioid Abuse   PHQ-2 Depression Scale:  Total score: 0  PHQ-2 Scoring interpretation table: (Score and probability of major depressive disorder)  Score 0 = No depression  Score 1 = 15.4% Probability  Score 2 = 21.1% Probability  Score 3 = 38.4% Probability  Score 4 = 45.5% Probability  Score 5 = 56.4% Probability  Score 6 = 78.6% Probability   PHQ-9 Depression Scale:  Total score: 0  PHQ-9 Scoring interpretation table:  Score 0-4 = No depression  Score 5-9 = Mild depression  Score 10-14 = Moderate depression  Score 15-19 = Moderately severe depression  Score 20-27 = Severe depression (2.4 times higher risk of SUD and 2.89 times higher risk of overuse)   Pharmacologic Plan: As per protocol, I have not taken over any controlled substance management, pending the results of ordered tests and/or consults.  Initial impression: Pending review of available data and ordered tests.  Meds   Current Outpatient Medications:  .  albuterol (PROVENTIL HFA;VENTOLIN HFA) 108 (90 Base) MCG/ACT inhaler, Inhale 2 puffs into the lungs every 6 (six) hours as needed for wheezing or shortness of breath., Disp: 1 Inhaler, Rfl: 3 .  aspirin 81 MG tablet, Take 81 mg by mouth daily., Disp: , Rfl:  .  azelastine (ASTELIN) 0.1 % nasal spray, Place 2 sprays into both nostrils 2 (two) times daily., Disp: 30 mL, Rfl: 6 .  azelastine (OPTIVAR) 0.05 % ophthalmic solution, drop affect eye twice a day, Disp: 6 mL, Rfl: 3 .  Blood Glucose Monitoring Suppl (ONE TOUCH ULTRA SYSTEM KIT) w/Device KIT, 1 kit by Does not  apply route once., Disp: 1 each, Rfl: 0 .  Canagliflozin-metFORMIN HCl (INVOKAMET) 150-500 MG TABS, Take 1 tablet by mouth 2 (two) times daily., Disp: 60 tablet, Rfl: 12 .  cyclobenzaprine (FLEXERIL) 5 MG tablet, Take 1 tablet (5 mg total) by mouth 3 (three) times daily as needed for muscle spasms., Disp: 30 tablet, Rfl: 1 .  dexlansoprazole (DEXILANT) 60 MG capsule, Take 1 capsule (60 mg total) by mouth daily., Disp: 30 capsule, Rfl: 12 .  glipiZIDE (GLUCOTROL XL) 5 MG 24 hr tablet, Take 5 mg by mouth daily with breakfast., Disp: , Rfl:  .  glucose blood (ONE TOUCH ULTRA TEST) test strip, Check blood sugar 3 times daily E11.65 (uncontrolled insulin dependent diabetes), Disp: 100 each, Rfl: 12 .  hydrOXYzine (VISTARIL) 25 MG capsule, Take 1-2 capsules (25-50 mg total) by mouth as directed. Only for severe anxiety symptoms - 2-3 times a week., Disp: 60 capsule, Rfl: 1 .  lamoTRIgine (LAMICTAL) 25 MG tablet, Take 1 tablet (25 mg total) by mouth 2 (two) times daily., Disp: 60 tablet, Rfl: 0 .  mometasone (ELOCON) 0.1 % cream, One application daily as needed for rash, Disp: 45 g, Rfl: 1 .  naloxegol oxalate (MOVANTIK) 25 MG TABS tablet, Take 1 tablet (25 mg total) by mouth daily with breakfast., Disp: 30 tablet, Rfl: 3 .  nitroGLYCERIN (NITROSTAT) 0.4 MG SL tablet, Place 0.4 mg under the tongue every 5 (five) minutes as needed for chest pain. , Disp: , Rfl:  .  nystatin (MYCOSTATIN) 100000 UNIT/ML suspension, Take 5 mLs (500,000 Units total) by mouth 4 (four) times daily., Disp: 60 mL, Rfl: 0 .  oxyCODONE (ROXICODONE) 15 MG immediate release tablet, Take 1 tablet (15 mg total) by mouth every 4 (four) hours as needed for pain., Disp: 120 tablet, Rfl: 0 .  pantoprazole (PROTONIX) 40 MG tablet, Take 1 tablet (40 mg total) by mouth daily., Disp: 30 tablet, Rfl: 3 .  pioglitazone (ACTOS) 15 MG tablet, Take 1 tablet (15 mg total) by mouth daily., Disp: 30 tablet, Rfl: 12 .  QUEtiapine (SEROQUEL) 25 MG tablet,  TAKE 1 TABLET (25 MG TOTAL) BY MOUTH AT BEDTIME. FOR MOOD AND SLEEP, Disp: 30 tablet, Rfl: 0 .  ranitidine (ZANTAC) 150 MG capsule, Take 150 mg by mouth as needed for heartburn., Disp: , Rfl:  .  rosuvastatin (CRESTOR) 40 MG tablet, TAKE 1 TABLET(40 MG) BY MOUTH DAILY, Disp: 30 tablet, Rfl: 11 .  sertraline (ZOLOFT) 100 MG tablet, Take 2 tablets (200 mg total) by mouth daily., Disp: 60 tablet, Rfl: 1 .  traZODone (DESYREL) 100 MG tablet, Take 2 tablets (200 mg total) by mouth at bedtime., Disp: 60 tablet, Rfl: 5 .  gabapentin (NEURONTIN) 300 MG capsule, Take 1 capsule (  300 mg total) by mouth 3 (three) times daily., Disp: 90 capsule, Rfl: 2  Imaging Review  Lumbar DG (Complete) 4+V:  Results for orders placed during the hospital encounter of 02/22/15  DG Lumbar Spine Complete   Narrative CLINICAL DATA:  Right-sided low back pain for 1 week. No known injury.  EXAM: LUMBAR SPINE - COMPLETE 4+ VIEW  COMPARISON:  02/18/2014  FINDINGS: There are 5 non rib-bearing lumbar type vertebral bodies.  There is a mild scoliotic curvature (measuring approximately 6 degrees as measured from the superior endplate of E01 to the inferior endplate of L4), unchanged since the 02/2014 examination. No anterolisthesis or retrolisthesis. No definite pars defects.  Lumbar vertebral body heights are preserved.  There is mild-to-moderate multilevel lumbar spine DDD, worse at L1-L2 and L2-L3 with disc space height loss, endplate irregularity and sclerosis.  Limited visualization the bilateral SI joints is normal.  Regional bowel gas pattern and soft tissues are normal.  IMPRESSION: 1. No acute findings. 2. Mild-to-moderate multilevel lumbar spine DDD, worse at L1-L2 and L2-L3, similar to the 02/2014 examination.   Electronically Signed   By: Sandi Mariscal M.D.   On: 02/23/2015 08:33      Complexity Note: Imaging results reviewed. Results shared with Mr. Basquez, using Layman's terms.                          ROS  Cardiovascular: Daily Aspirin intake, Chest pain, Heart attack ( Date: 2015) and Needs antibiotics prior to dental procedures Pulmonary or Respiratory: Wheezing and difficulty taking a deep full breath (Asthma) and Smoking Neurological: No reported neurological signs or symptoms such as seizures, abnormal skin sensations, urinary and/or fecal incontinence, being born with an abnormal open spine and/or a tethered spinal cord Review of Past Neurological Studies: No results found for this or any previous visit. Psychological-Psychiatric: Anxiousness, Depressed, Prone to panicking, Suicidal ideations, Attempted suicide, History of abuse and Difficulty sleeping and or falling asleep Gastrointestinal: Vomiting blood (Ulcers), Reflux or heatburn and Irregular, infrequent bowel movements (Constipation) Genitourinary: No reported renal or genitourinary signs or symptoms such as difficulty voiding or producing urine, peeing blood, non-functioning kidney, kidney stones, difficulty emptying the bladder, difficulty controlling the flow of urine, or chronic kidney disease Hematological: No reported hematological signs or symptoms such as prolonged bleeding, low or poor functioning platelets, bruising or bleeding easily, hereditary bleeding problems, low energy levels due to low hemoglobin or being anemic Endocrine: High blood sugar requiring insulin (IDDM) Rheumatologic: Rheumatoid arthritis Musculoskeletal: Negative for myasthenia gravis, muscular dystrophy, multiple sclerosis or malignant hyperthermia Work History: Out of work due to pain  Allergies  Mr. Milstein is allergic to montelukast; depakote [divalproex sodium]; singulair [montelukast sodium]; and atorvastatin.  Laboratory Chemistry  Inflammation Markers (CRP: Acute Phase) (ESR: Chronic Phase) No results found for: CRP, ESRSEDRATE, LATICACIDVEN                       Rheumatology Markers No results found for: RF, ANA, LABURIC, URICUR,  LYMEIGGIGMAB, LYMEABIGMQN, HLAB27                      Renal Function Markers Lab Results  Component Value Date   BUN 12 01/08/2018   CREATININE 0.82 01/08/2018   BCR 17 07/26/2017   GFRAA >60 01/08/2018   GFRNONAA >60 01/08/2018  Hepatic Function Markers Lab Results  Component Value Date   AST 21 01/08/2018   ALT 21 01/08/2018   ALBUMIN 4.7 01/08/2018   ALKPHOS 41 01/08/2018                        Electrolytes Lab Results  Component Value Date   NA 135 01/08/2018   K 4.6 01/08/2018   CL 101 01/08/2018   CALCIUM 9.4 01/08/2018   PHOS 3.0 03/10/2016                        Neuropathy Markers Lab Results  Component Value Date   HGBA1C 8.3 (H) 01/08/2018                        CNS Tests No results found for: COLORCSF, APPEARCSF, RBCCOUNTCSF, WBCCSF, POLYSCSF, LYMPHSCSF, EOSCSF, PROTEINCSF, GLUCCSF, JCVIRUS, CSFOLI, IGGCSF                      Bone Pathology Markers No results found for: VD25OH, [id number], [id number], [id number], 25OHVITD1, 25OHVITD2, 25OHVITD3, TESTOFREE, TESTOSTERONE                       Coagulation Parameters Lab Results  Component Value Date   PLT 216 01/08/2018                        Cardiovascular Markers Lab Results  Component Value Date   BNP 5 01/31/2017   HGB 15.0 01/08/2018   HCT 44.2 01/08/2018                         CA Markers No results found for: CEA, CA125, LABCA2                      Note: Lab results reviewed.  [place]  Drug: Mr. Sotomayor  reports that he does not use drugs. Alcohol:  reports that he does not drink alcohol. Tobacco:  reports that he quit smoking about 4 years ago. His smoking use included cigarettes. He has a 7.50 pack-year smoking history. He has never used smokeless tobacco. Medical:  has a past medical history of Anxiety, Asthma, Bipolar 2 disorder ([place]), Coronary artery disease, Fatty liver, and History of MI (myocardial infarction). Family: family history includes Diabetes in  his father; Heart attack in his father; Heart disease in his father; Hyperlipidemia in his father and mother.  Past Surgical History:  Procedure Laterality Date  . APPENDECTOMY    . CARDIAC CATHETERIZATION  06/24/2013  . COLONOSCOPY WITH PROPOFOL N/A 09/23/2014   Procedure: COLONOSCOPY WITH PROPOFOL;  Surgeon: Lucilla Lame, MD;  Location: [place];  Service: Endoscopy;  Laterality: N/A;  . CORONARY ANGIOPLASTY     stent placement to LAD  . CORONARY ARTERY BYPASS GRAFT  07/31/2013   x 3 vessels  . FINGER SURGERY    . NASAL SINUS SURGERY    . POLYPECTOMY  09/23/2014   Procedure: POLYPECTOMY;  Surgeon: Lucilla Lame, MD;  Location: [place];  Service: Endoscopy;;   Active Ambulatory Problems    Diagnosis Date Noted  . Coronary artery disease 07/07/2013  . Chronic constipation 09/18/2014  . Esophageal reflux 09/18/2014  . Esophageal spasm 09/18/2014  . Allergic rhinitis 06/18/2008  . Anxiety 10/28/2014  . Arthralgia 10/28/2014  . Chest wall pain 10/28/2014  .  Dysphagia 10/28/2014  . Fatigue 10/28/2014  . History of tobacco use 10/28/2014  . Hyperlipidemia, mixed 10/28/2014  . Insomnia 10/28/2014  . Current moderate episode of major depressive disorder without prior episode ([place]) 07/26/2017  . STEMI (ST elevation myocardial infarction) ([place]) 06/22/2013  . Thrush, oral 12/03/2017  . Suicide attempt ([place]) 01/08/2018  . Benzodiazepine overdose 01/08/2018  . Severe recurrent major depression without psychotic features ([place]) 01/08/2018  . Chronic pain 01/08/2018  . Depression 01/25/2018   Resolved Ambulatory Problems    Diagnosis Date Noted  . Encounter for screening colonoscopy 09/18/2014  . Antibiotics prophylaxis for dental procedure indicated due to prior joint replacement  10/28/2014  . History of MI (myocardial infarction) 10/28/2014  . Leg pain 10/28/2014  . Neck pain 10/28/2014  . Upper back strain 10/28/2014  . Upper back pain on left side 08/30/2015    Past Medical History:  Diagnosis Date  . Asthma   . Bipolar 2 disorder ([place])   . Fatty liver    Constitutional Exam  General appearance: Well nourished, well developed, and well hydrated. In no apparent acute distress Vitals:   02/26/18 1100  BP: 113/69  Pulse: 66  Resp: 16  Temp: 98.4 F (36.9 C)  SpO2: 98%  Weight: 190 lb (86.2 kg)  Height: '5\' 8"'$  (1.727 m)   BMI Assessment: Estimated body mass index is 28.89 kg/m as calculated from the following:   Height as of this encounter: '5\' 8"'$  (1.727 m).   Weight as of this encounter: 190 lb (86.2 kg).  BMI interpretation table: BMI level Category Range association with higher incidence of chronic pain  <18 kg/m2 Underweight   18.5-24.9 kg/m2 Ideal body weight   25-29.9 kg/m2 Overweight Increased incidence by 20%  30-34.9 kg/m2 Obese (Class I) Increased incidence by 68%  35-39.9 kg/m2 Severe obesity (Class II) Increased incidence by 136%  >40 kg/m2 Extreme obesity (Class III) Increased incidence by 254%   Patient's current BMI Ideal Body weight  Body mass index is 28.89 kg/m. Ideal body weight: 68.4 kg (150 lb 12.7 oz) Adjusted ideal body weight: 75.5 kg (166 lb 7.6 oz)   BMI Readings from Last 4 Encounters:  02/26/18 28.89 kg/m  02/22/18 31.64 kg/m  01/21/18 31.95 kg/m  01/08/18 29.65 kg/m   Wt Readings from Last 4 Encounters:  02/26/18 190 lb (86.2 kg)  02/22/18 202 lb (91.6 kg)  01/21/18 204 lb (92.5 kg)  01/08/18 195 lb (88.5 kg)  Psych/Mental status: Alert, oriented x 3 (person, place, & time)       Eyes: PERLA Respiratory: No evidence of acute respiratory distress  Cervical Spine Area Exam  Skin & Axial Inspection: No masses, redness, edema, swelling, or associated skin lesions Alignment: Symmetrical Functional ROM: Unrestricted ROM      Stability: No instability detected Muscle Tone/Strength: Functionally intact. No obvious neuro-muscular anomalies detected. Sensory (Neurological):  Unimpaired Palpation: No palpable anomalies              Upper Extremity (UE) Exam    Side: Right upper extremity  Side: Left upper extremity  Skin & Extremity Inspection: Skin color, temperature, and hair growth are WNL. No peripheral edema or cyanosis. No masses, redness, swelling, asymmetry, or associated skin lesions. No contractures.  Skin & Extremity Inspection: Skin color, temperature, and hair growth are WNL. No peripheral edema or cyanosis. No masses, redness, swelling, asymmetry, or associated skin lesions. No contractures.  Functional ROM: Pain restricted ROM for shoulder and elbow  Functional ROM: Pain restricted  ROM for shoulder and elbow  Muscle Tone/Strength: Functionally intact. No obvious neuro-muscular anomalies detected.  Muscle Tone/Strength: Functionally intact. No obvious neuro-muscular anomalies detected.  Sensory (Neurological): Unimpaired          Sensory (Neurological): Unimpaired          Palpation: No palpable anomalies              Palpation: No palpable anomalies              Provocative Test(s):  Phalen's test: deferred Tinel's test: deferred Apley's scratch test (touch opposite shoulder):  Action 1 (Across chest): Decreased ROM Action 2 (Overhead): Decreased ROM Action 3 (LB reach): Decreased ROM   Provocative Test(s):  Phalen's test: deferred Tinel's test: deferred Apley's scratch test (touch opposite shoulder):  Action 1 (Across chest): Decreased ROM Action 2 (Overhead): Decreased ROM Action 3 (LB reach): Decreased ROM    Allodynia along sternotomy scar  Thoracic Spine Area Exam  Skin & Axial Inspection: No masses, redness, or swelling Alignment: Symmetrical Functional ROM: Decreased ROM limited thoracic extension Stability: No instability detected Muscle Tone/Strength: Functionally intact. No obvious neuro-muscular anomalies detected. Sensory (Neurological): Musculoskeletal pain pattern and neuropathic Muscle strength & Tone: No palpable  anomalies  Lumbar Spine Area Exam  Skin & Axial Inspection: No masses, redness, or swelling Alignment: Symmetrical Functional ROM: Decreased ROM affecting both sides Stability: No instability detected Muscle Tone/Strength: Functionally intact. No obvious neuro-muscular anomalies detected. Sensory (Neurological): Dermatomal pain pattern Palpation: No palpable anomalies       Provocative Tests: Hyperextension/rotation test: deferred today       Lumbar quadrant test (Kemp's test): deferred today       Lateral bending test: deferred today       Patrick's Maneuver: deferred today                   FABER test: deferred today                   S-I anterior distraction/compression test: deferred today         S-I lateral compression test: deferred today         S-I Thigh-thrust test: deferred today         S-I Gaenslen's test: deferred today          Gait & Posture Assessment  Ambulation: Unassisted Gait: Relatively normal for age and body habitus Posture: WNL   Lower Extremity Exam    Side: Right lower extremity  Side: Left lower extremity  Stability: No instability observed          Stability: No instability observed          Skin & Extremity Inspection: Skin color, temperature, and hair growth are WNL. No peripheral edema or cyanosis. No masses, redness, swelling, asymmetry, or associated skin lesions. No contractures.  Skin & Extremity Inspection: Skin color, temperature, and hair growth are WNL. No peripheral edema or cyanosis. No masses, redness, swelling, asymmetry, or associated skin lesions. No contractures.  Functional ROM: Unrestricted ROM                  Functional ROM: Unrestricted ROM                  Muscle Tone/Strength: Functionally intact. No obvious neuro-muscular anomalies detected.  Muscle Tone/Strength: Functionally intact. No obvious neuro-muscular anomalies detected.  Sensory (Neurological): Unimpaired medial portion of foot (L4)  Sensory (Neurological): Unimpaired  medial portion of foot (L4)  DTR: Patellar:  deferred today Achilles: deferred today Plantar: deferred today  DTR: Patellar: deferred today Achilles: deferred today Plantar: deferred today  Palpation: No palpable anomalies  Palpation: No palpable anomalies   Assessment  Primary Diagnosis & Pertinent Problem List: The primary encounter diagnosis was Lumbar degenerative disc disease. Diagnoses of Lumbar facet arthropathy, Chronic midline low back pain, unspecified whether sciatica present, Current moderate episode of major depressive disorder without prior episode ([place]), Hx of CABG (chronic thoracic/chest wall pain), and Chronic pain syndrome were also pertinent to this visit.  Visit Diagnosis (New problems to examiner): 1. Lumbar degenerative disc disease   2. Lumbar facet arthropathy   3. Chronic midline low back pain, unspecified whether sciatica present   4. Current moderate episode of major depressive disorder without prior episode ([place])   5. Hx of CABG (chronic thoracic/chest wall pain)   6. Chronic pain syndrome    52 year old gentleman who presents with a history of chronic pain localized to his chest and upper thoracic region as well as axial low back pain that radiates into bilateral hips.  Of note patient had triple coronary artery bypass surgery in 2015.  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  Patient states that he has pain with even basic movements such as raising his hands overhead or doing basic chores that results in severe pain along his sternotomy site as well as his clavicle region.  The pain also radiates posteriorly.  Patient states that this started after his sternotomy from his CABG.  He states that this has significantly changed his life around.  He also struggles with bipolar disorder, depression, severe anxiety.  There is history of suicide attempt in his medical record but the patient did not elaborate on this when asked.  Previous imaging only  consists of lumbar spine x-rays which show lumbar degenerative disc disease.  Regards to current medications, patient is on oxycodone 15 mg up to 4 times a day as needed, MME equals 135.  Patient also takes Flexeril as needed.  In regards to treatment options, I had an extensive discussion with the patient.  In regards to his axial low back pain, his lumbar spine x-rays do show lumbar degenerative disc disease that needs to be better evaluated.  Place an order for lumbar MRI without contrast given worsening axial low back pain that has been refractory to conservative measures including physical therapy, heat, massage, NSAIDs, opioid medications.  In regards to his opioid medication, expressed my concerns about him being on such a high dose.  Patient's current MME's are 135.  I informed him that our clinic policy is not to exceed 60 MME is generally.  Patient can continue opioid medication management with his primary care physician.  If patient and/or PCP would like for me to consider taking over, patient must be weaned to less than 70 MME, approximately half his current dose.  Furthermore the patient should try to be optimized on non-opioid analgesics.  Patient denies trying gabapentin or Lyrica which could be helpful for his overall pain.  Will provide prescription for gabapentin as below.  Pending results from lumbar MRI can discuss lumbar facet medial branch nerve blocks and or lumbar epidural steroid injection.  Kidney function within normal limits.  Addendum: Patient has history of claustrophobia, requesting anxiety medication for MRI.  Prescription for Valium 2 mg sent in to be taken once before MRI.  Plan of Care (Initial workup plan)  Note: Please be advised that as per protocol, today's visit  has been an evaluation only. We have not taken over the patient's controlled substance management.  Ordered Lab-work, Procedure(s), Referral(s), & Consult(s): Orders Placed This Encounter   Procedures  . MR LUMBAR SPINE WO CONTRAST  . Compliance Drug Analysis, Ur   Pharmacotherapy (current): Medications ordered:  Meds ordered this encounter  Medications  . gabapentin (NEURONTIN) 300 MG capsule    Sig: Take 1 capsule (300 mg total) by mouth 3 (three) times daily.    Dispense:  90 capsule    Refill:  2   Medications administered during this visit: [name] had no medications administered during this visit.   Pharmacological management options:  Opioid Analgesics: The patient was informed that there is no guarantee that he would be a candidate for opioid analgesics. The decision will be made following CDC guidelines. This decision will be based on the results of diagnostic studies, as well as Mr. Mcree risk profile.   Membrane stabilizer: Trial of gabapentin today.  Can consider Lyrica in the future.  Muscle relaxant: Takes Flexeril as needed  NSAID: Avoid given history of CABG  Other analgesic(s): To be determined at a later time   Interventional management options: Mr. Hulbert was informed that there is no guarantee that he would be a candidate for interventional therapies. The decision will be based on the results of diagnostic studies, as well as Mr. Soy risk profile.  Procedure(s) under consideration:  Lumbar facet medial branch nerve blocks Lumbar epidural steroid injection   Provider-requested follow-up: Return in about 4 weeks (around 03/26/2018) for After Imaging, Medication Management.  Future Appointments  Date Time Provider [place]  03/19/2018  1:00 PM [facility]  03/22/2018  4:00 PM Birdie Sons, MD [facility] None  03/26/2018 10:30 AM Gillis Santa, MD [facility] None  04/17/2018 11:00 AM Birdie Sons, MD [facility] None    Primary Care Physician: Birdie Sons, MD Location: [facility] Note by: Gillis Santa, M.D, Date: 02/26/2018; Time: 1:41 PM  Patient Instructions   Gabapentin has been escribed to your pharmacy.  You will have MRI prior to next appt. With pain clinic.

## 2018-02-26 NOTE — Telephone Encounter (Signed)
Patient notifed via message of above.

## 2018-03-01 LAB — COMPLIANCE DRUG ANALYSIS, UR

## 2018-03-07 ENCOUNTER — Other Ambulatory Visit: Payer: Self-pay | Admitting: Family Medicine

## 2018-03-07 DIAGNOSIS — F410 Panic disorder [episodic paroxysmal anxiety] without agoraphobia: Secondary | ICD-10-CM

## 2018-03-07 DIAGNOSIS — F419 Anxiety disorder, unspecified: Secondary | ICD-10-CM

## 2018-03-08 ENCOUNTER — Other Ambulatory Visit: Payer: Self-pay | Admitting: Family Medicine

## 2018-03-08 DIAGNOSIS — G8929 Other chronic pain: Secondary | ICD-10-CM

## 2018-03-08 DIAGNOSIS — M545 Low back pain, unspecified: Secondary | ICD-10-CM

## 2018-03-08 MED ORDER — OXYCODONE HCL 15 MG PO TABS: 15.0000 mg | ORAL_TABLET | ORAL | 0 refills | Status: DC | PRN

## 2018-03-08 NOTE — Telephone Encounter (Signed)
Pt needing a refill on: oxyCODONE (ROXICODONE) 15 MG immediate release tablet  Please fill at:  [facility], [place] [phone] (Phone) [phone] (Fax)   Thanks, [place]

## 2018-03-14 DIAGNOSIS — F411 Generalized anxiety disorder: Secondary | ICD-10-CM | POA: Diagnosis not present

## 2018-03-14 DIAGNOSIS — Z1389 Encounter for screening for other disorder: Secondary | ICD-10-CM | POA: Diagnosis not present

## 2018-03-14 DIAGNOSIS — F3111 Bipolar disorder, current episode manic without psychotic features, mild: Secondary | ICD-10-CM | POA: Diagnosis not present

## 2018-03-18 ENCOUNTER — Other Ambulatory Visit: Payer: Self-pay | Admitting: Family Medicine

## 2018-03-18 ENCOUNTER — Telehealth: Payer: Self-pay

## 2018-03-18 MED ORDER — ALPRAZOLAM 0.5 MG PO TABS: 0.5000 mg | ORAL_TABLET | Freq: Every day | ORAL | 1 refills | Status: DC | PRN

## 2018-03-18 NOTE — Telephone Encounter (Signed)
Which pharmacy?

## 2018-03-18 NOTE — Telephone Encounter (Signed)
Patient states that he is returning [name]'s call and wanted to advise her to his pharmacy is [facility] on [address] KW

## 2018-03-18 NOTE — Telephone Encounter (Signed)
LMTCB 03/18/2018  Thanks,   -Davonne Jarnigan  

## 2018-03-18 NOTE — Telephone Encounter (Signed)
Daughter - Anoop Hemmer was calling for pt regarding pt needing a refill on Xanax. She is picking up all pt's medications at the pharmacy.  She said her father can be called if there are any questions.  Please advise.  Thanks, [employer]

## 2018-03-18 NOTE — Telephone Encounter (Signed)
I don't see alprazolam on his med list.  Please advise.   Thanks,   -Mickel Baas

## 2018-03-18 NOTE — Telephone Encounter (Signed)
Pt uses [facility].  Thanks,   -Mickel Baas

## 2018-03-19 ENCOUNTER — Ambulatory Visit
Admission: RE | Admit: 2018-03-19 | Discharge: 2018-03-19 | Disposition: A | Discharge status: Home / Self Care | Payer: BLUE CROSS/BLUE SHIELD | Source: Ambulatory Visit | Attending: Student in an Organized Health Care Education/Training Program | Admitting: Student in an Organized Health Care Education/Training Program

## 2018-03-19 DIAGNOSIS — M5126 Other intervertebral disc displacement, lumbar region: Secondary | ICD-10-CM | POA: Insufficient documentation

## 2018-03-19 DIAGNOSIS — M545 Low back pain: Secondary | ICD-10-CM

## 2018-03-19 DIAGNOSIS — M5136 Other intervertebral disc degeneration, lumbar region: Secondary | ICD-10-CM | POA: Diagnosis not present

## 2018-03-19 DIAGNOSIS — G8929 Other chronic pain: Secondary | ICD-10-CM

## 2018-03-22 ENCOUNTER — Ambulatory Visit: Payer: Self-pay | Admitting: Family Medicine

## 2018-03-25 ENCOUNTER — Ambulatory Visit (INDEPENDENT_AMBULATORY_CARE_PROVIDER_SITE_OTHER): Payer: BLUE CROSS/BLUE SHIELD | Admitting: Family Medicine

## 2018-03-25 ENCOUNTER — Ambulatory Visit
Admission: RE | Admit: 2018-03-25 | Discharge: 2018-03-25 | Disposition: A | Discharge status: Home / Self Care | Payer: BLUE CROSS/BLUE SHIELD | Attending: Family Medicine | Admitting: Family Medicine

## 2018-03-25 ENCOUNTER — Ambulatory Visit
Admission: RE | Admit: 2018-03-25 | Discharge: 2018-03-25 | Disposition: A | Discharge status: Home / Self Care | Payer: BLUE CROSS/BLUE SHIELD | Source: Ambulatory Visit | Attending: Family Medicine | Admitting: Family Medicine

## 2018-03-25 ENCOUNTER — Encounter: Payer: Self-pay | Admitting: Family Medicine

## 2018-03-25 VITALS — BP 114/68 | HR 84 | Temp 98.4°F | Resp 16 | Wt 209.0 lb

## 2018-03-25 DIAGNOSIS — K5909 Other constipation: Secondary | ICD-10-CM

## 2018-03-25 DIAGNOSIS — R0600 Dyspnea, unspecified: Secondary | ICD-10-CM

## 2018-03-25 DIAGNOSIS — E1165 Type 2 diabetes mellitus with hyperglycemia: Secondary | ICD-10-CM

## 2018-03-25 DIAGNOSIS — R0609 Other forms of dyspnea: Secondary | ICD-10-CM | POA: Insufficient documentation

## 2018-03-25 DIAGNOSIS — Z125 Encounter for screening for malignant neoplasm of prostate: Secondary | ICD-10-CM | POA: Diagnosis not present

## 2018-03-25 DIAGNOSIS — R1900 Intra-abdominal and pelvic swelling, mass and lump, unspecified site: Secondary | ICD-10-CM | POA: Diagnosis not present

## 2018-03-25 DIAGNOSIS — R0602 Shortness of breath: Secondary | ICD-10-CM | POA: Diagnosis not present

## 2018-03-25 MED ORDER — LINACLOTIDE 290 MCG PO CAPS: 290.0000 ug | ORAL_CAPSULE | Freq: Every day | ORAL | 5 refills | Status: DC

## 2018-03-25 MED ORDER — ALBUTEROL SULFATE HFA 108 (90 BASE) MCG/ACT IN AERS: 2.0000 | INHALATION_SPRAY | Freq: Four times a day (QID) | RESPIRATORY_TRACT | 3 refills | Status: DC | PRN

## 2018-03-25 NOTE — Progress Notes (Signed)
Patient: [facility] Male    DOB: 1965-04-21   52 y.o.   MRN: [id number] Visit Date: 03/25/2018  Today's Provider: [name], MD   Chief Complaint  Patient presents with  . Diabetes  . Constipation  . Insomnia   Subjective:     Diabetes  He presents for his follow-up diabetic visit. He has type 2 diabetes mellitus. His disease course has been stable. Hypoglycemia symptoms include dizziness. Pertinent negatives for hypoglycemia include no headaches. Associated symptoms include chest pain (Secondary to surgery), polydipsia and polyphagia. Pertinent negatives for diabetes include no blurred vision, no fatigue, no foot paresthesias, no foot ulcerations, no polyuria, no visual change, no weakness and no weight loss. He is compliant with treatment all of the time. There is no change (Fasting between 130-175) in his home blood glucose trend.  Insomnia  Primary symptoms: no sleep disturbance.  The problem has been gradually improving (With medication changes) since onset. Past treatments include medication. The treatment provided moderate relief. Typical bedtime:  8-10 P.M..  PMH includes: depression, chronic pain, no apnea.  Constipation  This is a chronic problem. The problem is unchanged (Pt tried Movantik and he states it did not help.). Pertinent negatives include no abdominal pain, diarrhea, nausea, rectal pain, vomiting or weight loss. The treatment provided no relief.   Wt Readings from Last 3 Encounters:  03/25/18 209 lb (94.8 kg)  02/26/18 190 lb (86.2 kg)  02/22/18 202 lb (91.6 kg)     Allergies  Allergen Reactions  . Montelukast Hives    Singular  . Depakote [Divalproex Sodium] Rash  . Singulair [Montelukast Sodium] Hives  . Atorvastatin Other (See Comments)    Muscle pain, neck stiffness     Current Outpatient Medications:  .  albuterol (PROVENTIL HFA;VENTOLIN HFA) 108 (90 Base) MCG/ACT inhaler, Inhale 2 puffs into the lungs every 6 (six) hours  as needed for wheezing or shortness of breath., Disp: 1 Inhaler, Rfl: 3 .  ALPRAZolam (XANAX) 0.5 MG tablet, Take 1 tablet (0.5 mg total) by mouth daily as needed for anxiety., Disp: 12 tablet, Rfl: 1 .  aspirin 81 MG tablet, Take 81 mg by mouth daily., Disp: , Rfl:  .  azelastine (ASTELIN) 0.1 % nasal spray, Place 2 sprays into both nostrils 2 (two) times daily., Disp: 30 mL, Rfl: 6 .  azelastine (OPTIVAR) 0.05 % ophthalmic solution, drop affect eye twice a day, Disp: 6 mL, Rfl: 3 .  Blood Glucose Monitoring Suppl (ONE TOUCH ULTRA SYSTEM KIT) w/Device KIT, 1 kit by Does not apply route once., Disp: 1 each, Rfl: 0 .  Canagliflozin-metFORMIN HCl (INVOKAMET) 150-500 MG TABS, Take 1 tablet by mouth 2 (two) times daily., Disp: 60 tablet, Rfl: 12 .  cyclobenzaprine (FLEXERIL) 5 MG tablet, Take 1 tablet (5 mg total) by mouth 3 (three) times daily as needed for muscle spasms., Disp: 30 tablet, Rfl: 1 .  dexlansoprazole (DEXILANT) 60 MG capsule, Take 1 capsule (60 mg total) by mouth daily., Disp: 30 capsule, Rfl: 12 .  gabapentin (NEURONTIN) 300 MG capsule, Take 1 capsule (300 mg total) by mouth 3 (three) times daily., Disp: 90 capsule, Rfl: 2 .  glipiZIDE (GLUCOTROL XL) 5 MG 24 hr tablet, Take 5 mg by mouth daily with breakfast., Disp: , Rfl:  .  glucose blood (ONE TOUCH ULTRA TEST) test strip, Check blood sugar 3 times daily E11.65 (uncontrolled insulin dependent diabetes), Disp: 100 each, Rfl: 12 .  lamoTRIgine (LAMICTAL) 25 MG  tablet, Take 1 tablet (25 mg total) by mouth 2 (two) times daily., Disp: 60 tablet, Rfl: 0 .  mometasone (ELOCON) 0.1 % cream, One application daily as needed for rash, Disp: 45 g, Rfl: 1 .  nitroGLYCERIN (NITROSTAT) 0.4 MG SL tablet, Place 0.4 mg under the tongue every 5 (five) minutes as needed for chest pain. , Disp: , Rfl:  .  oxyCODONE (ROXICODONE) 15 MG immediate release tablet, Take 1 tablet (15 mg total) by mouth every 4 (four) hours as needed for pain., Disp: 120 tablet,  Rfl: 0 .  pantoprazole (PROTONIX) 40 MG tablet, Take 1 tablet (40 mg total) by mouth daily., Disp: 30 tablet, Rfl: 3 .  pioglitazone (ACTOS) 15 MG tablet, Take 1 tablet (15 mg total) by mouth daily., Disp: 30 tablet, Rfl: 12 .  ranitidine (ZANTAC) 150 MG capsule, Take 150 mg by mouth as needed for heartburn., Disp: , Rfl:  .  rosuvastatin (CRESTOR) 40 MG tablet, TAKE 1 TABLET(40 MG) BY MOUTH DAILY, Disp: 30 tablet, Rfl: 11 .  sertraline (ZOLOFT) 100 MG tablet, TAKE 2 TABLETS(200 MG) BY MOUTH DAILY (Patient taking differently: 50 mg daily. ), Disp: 60 tablet, Rfl: 3 .  diazepam (VALIUM) 2 MG tablet, Take 1 tablet (2 mg total) by mouth once as needed for up to 1 dose for anxiety. Take 1 prior to MRI (Patient not taking: Reported on 03/25/2018), Disp: 1 tablet, Rfl: 0 .  hydrOXYzine (VISTARIL) 25 MG capsule, Take 1-2 capsules (25-50 mg total) by mouth as directed. Only for severe anxiety symptoms - 2-3 times a week. (Patient not taking: Reported on 03/25/2018), Disp: 60 capsule, Rfl: 1 .  naloxegol oxalate (MOVANTIK) 25 MG TABS tablet, Take 1 tablet (25 mg total) by mouth daily with breakfast. (Patient not taking: Reported on 03/25/2018), Disp: 30 tablet, Rfl: 3 .  nystatin (MYCOSTATIN) 100000 UNIT/ML suspension, Take 5 mLs (500,000 Units total) by mouth 4 (four) times daily. (Patient not taking: Reported on 03/25/2018), Disp: 60 mL, Rfl: 0 .  QUEtiapine (SEROQUEL) 25 MG tablet, TAKE 1 TABLET (25 MG TOTAL) BY MOUTH AT BEDTIME. FOR MOOD AND SLEEP, Disp: 30 tablet, Rfl: 0 .  traZODone (DESYREL) 100 MG tablet, Take 2 tablets (200 mg total) by mouth at bedtime. (Patient not taking: Reported on 03/25/2018), Disp: 60 tablet, Rfl: 5  Review of Systems  Constitutional: Negative.  Negative for fatigue and weight loss.  Eyes: Negative for blurred vision.  Respiratory: Positive for shortness of breath. Negative for apnea, cough, choking, chest tightness, wheezing and stridor.   Cardiovascular: Positive for chest  pain (Secondary to surgery) and leg swelling. Negative for palpitations.  Gastrointestinal: Positive for abdominal distention and constipation. Negative for abdominal pain, anal bleeding, blood in stool, diarrhea, nausea, rectal pain and vomiting.  Endocrine: Positive for polydipsia and polyphagia. Negative for polyuria.  Neurological: Positive for dizziness. Negative for weakness, light-headedness and headaches.  Psychiatric/Behavioral: Positive for depression. Negative for sleep disturbance. The patient has insomnia.     Social History   Tobacco Use  . Smoking status: Former Smoker    Packs/day: 0.25    Years: 30.00    Pack years: 7.50    Types: Cigarettes    Last attempt to quit: 08/08/2013    Years since quitting: 4.6  . Smokeless tobacco: Never Used  Substance Use Topics  . Alcohol use: No      Objective:   BP 114/68 (BP Location: Right Arm, Patient Position: Sitting, Cuff Size: Large)   Pulse 84  Temp 98.4 F (36.9 C) (Oral)   Resp 16   Wt 209 lb (94.8 kg)   BMI 31.78 kg/m  Vitals:   03/25/18 1002  BP: 114/68  Pulse: 84  Resp: 16  Temp: 98.4 F (36.9 C)  TempSrc: Oral  Weight: 209 lb (94.8 kg)    No results found.    Physical Exam  General Appearance:    Alert, cooperative, no distress  Eyes:    PERRL, conjunctiva/corneas clear, EOM's intact       Lungs:     Clear to auscultation bilaterally, respirations unlabored  Heart:    Regular rate and rhythm  Abdomen:   Moderately distended, no masses, no tenderness.      No results found.    Assessment & Plan    1. Dyspnea on exertion refill- albuterol (PROVENTIL HFA;VENTOLIN HFA) 108 (90 Base) MCG/ACT inhaler; Inhale 2 puffs into the lungs every 6 (six) hours as needed for wheezing or shortness of breath.  Dispense: 1 Inhaler; Refill: 3  - CBC - Brain natriuretic peptide - DG Chest 2 View; Future  2. Uncontrolled type 2 diabetes mellitus with hyperglycemia (HCC)  - Comprehensive metabolic panel -  Hemoglobin A1c  3. Abdominal swelling  - Comprehensive metabolic panel - US Abdomen Complete; Future  4. Chronic constipation No relief for Movantik. He states linzess worked much better for him in the past. .   5. Prostate cancer screening  - PSA     [name], MD  [facility]

## 2018-03-25 NOTE — Patient Instructions (Addendum)
.   PLEASE BRING ALL OF YOUR MEDICATIONS TO EVERY APPOINTMENT TO MAKE SURE OUR MEDICATION LIST IS THE SAME AS YOURS   [facility]  [facility]

## 2018-03-26 ENCOUNTER — Encounter: Payer: Self-pay | Admitting: Student in an Organized Health Care Education/Training Program

## 2018-03-26 ENCOUNTER — Ambulatory Visit
Payer: BLUE CROSS/BLUE SHIELD | Attending: Student in an Organized Health Care Education/Training Program | Admitting: Student in an Organized Health Care Education/Training Program

## 2018-03-26 ENCOUNTER — Telehealth: Payer: Self-pay | Admitting: Family Medicine

## 2018-03-26 ENCOUNTER — Other Ambulatory Visit: Payer: Self-pay

## 2018-03-26 VITALS — BP 115/79 | HR 77 | Temp 98.0°F | Resp 16 | Ht 68.0 in | Wt 200.0 lb

## 2018-03-26 DIAGNOSIS — G894 Chronic pain syndrome: Secondary | ICD-10-CM | POA: Insufficient documentation

## 2018-03-26 DIAGNOSIS — Z951 Presence of aortocoronary bypass graft: Secondary | ICD-10-CM | POA: Insufficient documentation

## 2018-03-26 DIAGNOSIS — F321 Major depressive disorder, single episode, moderate: Secondary | ICD-10-CM

## 2018-03-26 DIAGNOSIS — M545 Low back pain: Secondary | ICD-10-CM

## 2018-03-26 DIAGNOSIS — M25552 Pain in left hip: Secondary | ICD-10-CM | POA: Diagnosis not present

## 2018-03-26 DIAGNOSIS — M533 Sacrococcygeal disorders, not elsewhere classified: Secondary | ICD-10-CM | POA: Diagnosis not present

## 2018-03-26 DIAGNOSIS — G8929 Other chronic pain: Secondary | ICD-10-CM | POA: Insufficient documentation

## 2018-03-26 LAB — COMPREHENSIVE METABOLIC PANEL
ALT: 29 IU/L (ref 0–44)
AST: 25 IU/L (ref 0–40)
Albumin/Globulin Ratio: 2.2 (ref 1.2–2.2)
Albumin: 4.4 g/dL (ref 3.5–5.5)
Alkaline Phosphatase: 57 IU/L (ref 39–117)
BUN/Creatinine Ratio: 9 (ref 9–20)
BUN: 10 mg/dL (ref 6–24)
Bilirubin Total: 0.2 mg/dL (ref 0.0–1.2)
CO2: 21 mmol/L (ref 20–29)
Calcium: 9.9 mg/dL (ref 8.7–10.2)
Chloride: 100 mmol/L (ref 96–106)
Creatinine, Ser: 1.06 mg/dL (ref 0.76–1.27)
GFR calc Af Amer: 92 mL/min/{1.73_m2} (ref >59)
GFR calc non Af Amer: 79 mL/min/{1.73_m2} (ref >59)
Globulin, Total: 2 g/dL (ref 1.5–4.5)
Glucose: 188 mg/dL — ABNORMAL HIGH (ref 65–99)
Potassium: 4.4 mmol/L (ref 3.5–5.2)
Sodium: 137 mmol/L (ref 134–144)
Total Protein: 6.4 g/dL (ref 6.0–8.5)

## 2018-03-26 LAB — PSA: Prostate Specific Ag, Serum: <0.1 ng/mL (ref 0.0–4.0)

## 2018-03-26 LAB — HEMOGLOBIN A1C
Est. average glucose Bld gHb Est-mCnc: 180 mg/dL
Hgb A1c MFr Bld: 7.9 % — ABNORMAL HIGH (ref 4.8–5.6)

## 2018-03-26 LAB — BRAIN NATRIURETIC PEPTIDE: BNP: 27.8 pg/mL (ref 0.0–100.0)

## 2018-03-26 LAB — CBC
Hematocrit: 42.1 % (ref 37.5–51.0)
Hemoglobin: 14.1 g/dL (ref 13.0–17.7)
MCH: 27.1 pg (ref 26.6–33.0)
MCHC: 33.5 g/dL (ref 31.5–35.7)
MCV: 81 fL (ref 79–97)
Platelets: 216 10*3/uL (ref 150–450)
RBC: 5.21 x10E6/uL (ref 4.14–5.80)
RDW: 12.7 % (ref 12.3–15.4)
WBC: 6.7 10*3/uL (ref 3.4–10.8)

## 2018-03-26 MED ORDER — TIZANIDINE HCL 4 MG PO CAPS: 4.0000 mg | ORAL_CAPSULE | Freq: Two times a day (BID) | ORAL | 0 refills | Status: DC | PRN

## 2018-03-26 NOTE — Progress Notes (Signed)
Patient's Name: Vincent Hall  MRN: [id number]  Referring Provider: Birdie Sons, MD  DOB: [birth year]  PCP: Birdie Sons, MD  DOS: 03/26/2018  Note by: Gillis Santa, MD  Service setting: Ambulatory outpatient  Specialty: Interventional Pain Management  Location: [facility] (AMB) Pain Management Facility    Patient type: Established   Primary Reason(s) for Visit: Encounter for evaluation before starting new chronic pain management plan of care (Level of risk: moderate) CC: Back Pain (low, mid and upper)  HPI  Mr. Vincent Hall is a 52 y.o. year old, male patient, who comes today for a follow-up evaluation to review the test results and decide on a treatment plan. He has Coronary artery disease; Chronic constipation; Esophageal reflux; Esophageal spasm; Allergic rhinitis; Anxiety; Arthralgia; Chest wall pain; Dysphagia; Fatigue; History of tobacco use; Hyperlipidemia, mixed; Insomnia; Current moderate episode of major depressive disorder without prior episode [facility]); STEMI (ST elevation myocardial infarction) ([place]); Thrush, oral; Suicide attempt ([place]); Benzodiazepine overdose; Severe recurrent major depression without psychotic features ([place]); Chronic pain; Depression; Left hip pain; Chronic left SI joint pain; and Hx of CABG (chronic thoracic/chest wall pain) on their problem list. His primarily concern today is the Back Pain (low, mid and upper)  Pain Assessment: Location: Lower, Mid, Upper Back Radiating: radiates into both  hips and legs  Onset: More than a month ago Duration: Chronic pain Quality: (annoying) Severity: 5 /10 (subjective, self-reported pain score)  Note: Reported level is compatible with observation.                         When using our objective Pain Scale, levels between 6 and 10/10 are said to belong in an emergency room, as it progressively worsens from a 6/10, described as severely limiting, requiring emergency care not usually available at an outpatient pain  management facility. At a 6/10 level, communication becomes difficult and requires great effort. Assistance to reach the emergency department may be required. Facial flushing and profuse sweating along with potentially dangerous increases in heart rate and blood pressure will be evident. Effect on ADL: "I cant do much" Timing: Constant Modifying factors: oxycodone only BP: 115/79  HR: 77  Vincent Hall comes in today for a follow-up visit after his initial evaluation on 02/26/2018. Today we went over the results of his tests. These were explained in "Layman's terms". During today's appointment we went over my diagnostic impression, as well as the proposed treatment plan.  Of note, patient was late to his appointment.  Patient that his appointment was at 1:30 when he was actually at 10:30 AM.  Patient follows up for his second patient visit.  Is continued to endorse diffuse low back pain as well as left hip pain.  Lumbar MRI results below which were largely unremarkable.  Please see assessment and plan below  Controlled Substance Pharmacotherapy Assessment REMS (Risk Evaluation and Mitigation Strategy)  Analgesic: Oxycodone 15 mg 4 times daily as needed, quantity 120/month MME/day: 135 mg/day. Pill Count: None expected due to no prior prescriptions written by our practice. Dewayne Shorter, RN  03/26/2018  2:05 PM  Signed Safety precautions to be maintained throughout the outpatient stay will include: orient to surroundings, keep bed in low position, maintain call bell within reach at all times, provide assistance with transfer out of bed and ambulation.   Pharmacokinetics: Liberation and absorption (onset of action): WNL Distribution (time to peak effect): WNL Metabolism and excretion (duration of action): WNL  Pharmacodynamics: Desired effects: Analgesia: Vincent Hall reports <50% benefit. Functional ability: Patient reports that medication does help, but not nearly as much as he would  like Clinically meaningful improvement in function (CMIF): Sustained CMIF goals met Perceived effectiveness: Described as ineffective and would like to make some changes Undesirable effects: Side-effects or Adverse reactions: None reported Monitoring: [place] PMP: Online review of the past 65-monthperiod previously conducted. Not applicable at this point since we have not taken over the patient's medication management yet. List of other Serum/Urine Drug Screening Test(s):  Lab Results  Component Value Date   COCAINSCRNUR NONE DETECTED 01/08/2018   THCU NONE DETECTED 01/08/2018   ETH <10 01/08/2018   List of all UDS test(s) done:  Lab Results  Component Value Date   SUMMARY FINAL 02/26/2018   Last UDS on record: Summary  Date Value Ref Range Status  02/26/2018 FINAL  Final    Comment:    ==================================================================== TOXASSURE COMP DRUG ANALYSIS,UR ==================================================================== Test                             Result       Flag       Units Drug Present   Oxycodone                      3865                    ng/mg creat   Oxymorphone                    1559                    ng/mg creat   Noroxycodone                   8064                    ng/mg creat   Noroxymorphone                 574                     ng/mg creat    Sources of oxycodone are scheduled prescription medications.    Oxymorphone, noroxycodone, and noroxymorphone are expected    metabolites of oxycodone. Oxymorphone is also available as a    scheduled prescription medication.   Lamotrigine                    PRESENT   Sertraline                     PRESENT   Desmethylsertraline            PRESENT    Desmethylsertraline is an expected metabolite of sertraline.   Trazodone                      PRESENT   1,3 chlorophenyl piperazine    PRESENT    1,3-chlorophenyl piperazine is an expected metabolite of    trazodone.   Acetaminophen                   PRESENT ==================================================================== Test                      Result    Flag   Units  Ref Range   Creatinine              74               mg/dL      >=20 ==================================================================== Declared Medications:  Medication list was not provided. ==================================================================== For clinical consultation, please call [phone]. ====================================================================    UDS interpretation: No unexpected findings.          Medication Assessment Form: Not applicable. No opioids.  Risk Assessment Profile: Aberrant behavior: See initial evaluations. None observed or detected today Comorbid factors increasing risk of overdose: See initial evaluation. No additional risks detected today Opioid risk tool (ORT) (Total Score): 3 Personal History of Substance Abuse (SUD-Substance use disorder):  Alcohol: Negative  Illegal Drugs: Negative  Rx Drugs: Negative  ORT Risk Level calculation: Low Risk Risk of substance use disorder (SUD): Low Opioid Risk Tool - 03/26/18 1404      Family History of Substance Abuse   Alcohol  Negative    Illegal Drugs  Negative    Rx Drugs  Negative      Personal History of Substance Abuse   Alcohol  Negative    Illegal Drugs  Negative    Rx Drugs  Negative      Age   Age between 52-45 years   No      History of Preadolescent Sexual Abuse   History of Preadolescent Sexual Abuse  Negative or Male      Psychological Disease   Psychological Disease  Positive    Depression  Positive      Total Score   Opioid Risk Tool Scoring  3    Opioid Risk Interpretation  Low Risk      ORT Scoring interpretation table:  Score <3 = Low Risk for SUD  Score between 4-7 = Moderate Risk for SUD  Score >8 = High Risk for Opioid Abuse   Risk Mitigation Strategies:  Patient opioid safety counseling: Opioid  therapy will not be included in the treatment plan.  Patient not a candidate for chronic opioid therapy, please see detailed assessment and plan below Patient-Prescriber Agreement (PPA): No agreement signed.  Controlled substance notification to other providers: None required. No opioid therapy.  Pharmacologic Plan: Non-opioid analgesic therapy offered.             Laboratory Chemistry  Inflammation Markers (CRP: Acute Phase) (ESR: Chronic Phase) No results found for: CRP, ESRSEDRATE, LATICACIDVEN                       Rheumatology Markers No results found for: RF, ANA, LABURIC, URICUR, LYMEIGGIGMAB, LYMEABIGMQN, HLAB27                      Renal Function Markers Lab Results  Component Value Date   BUN 10 03/25/2018   CREATININE 1.06 03/25/2018   BCR 9 03/25/2018   GFRAA 92 03/25/2018   GFRNONAA 79 03/25/2018                             Hepatic Function Markers Lab Results  Component Value Date   AST 25 03/25/2018   ALT 29 03/25/2018   ALBUMIN 4.4 03/25/2018   ALKPHOS 57 03/25/2018                        Electrolytes Lab Results  Component Value Date   NA  137 03/25/2018   K 4.4 03/25/2018   CL 100 03/25/2018   CALCIUM 9.9 03/25/2018   PHOS 3.0 03/10/2016                        Neuropathy Markers Lab Results  Component Value Date   HGBA1C 7.9 (H) 03/25/2018                        CNS Tests No results found for: COLORCSF, APPEARCSF, RBCCOUNTCSF, WBCCSF, POLYSCSF, LYMPHSCSF, EOSCSF, PROTEINCSF, GLUCCSF, JCVIRUS, CSFOLI, IGGCSF                      Bone Pathology Markers No results found for: VD25OH, [id number], PR9458PF2, TW4462MM3, 25OHVITD1, 25OHVITD2, 25OHVITD3, TESTOFREE, TESTOSTERONE                       Coagulation Parameters Lab Results  Component Value Date   PLT 216 03/25/2018                        Cardiovascular Markers Lab Results  Component Value Date   BNP WILL FOLLOW 03/25/2018   HGB 14.1 03/25/2018   HCT 42.1 03/25/2018                          CA Markers No results found for: CEA, CA125, LABCA2                      Note: Lab results reviewed.  Recent Diagnostic Imaging Review  Lumbosacral Imaging: Lumbar MR wo contrast:  Results for orders placed during the hospital encounter of 03/19/18  MR LUMBAR SPINE WO CONTRAST   Narrative CLINICAL DATA:  Chronic low back pain and restless legs, worse for 1 year.  EXAM: MRI LUMBAR SPINE WITHOUT CONTRAST  TECHNIQUE: Multiplanar, multisequence MR imaging of the lumbar spine was performed. No intravenous contrast was administered.  COMPARISON:  Lumbar spine radiographs February 22, 2015.  FINDINGS: SEGMENTATION: For the purposes of this report, the last well-formed intervertebral disc is reported as L5-S1.  ALIGNMENT: Maintained lumbar lordosis. No malalignment.  VERTEBRAE:Vertebral bodies are intact. Intervertebral discs demonstrate normal morphology, mild desiccation L1-2, L2-3 and L5-S1 with multilevel mild chronic discogenic endplate changes. No abnormal or acute bone marrow signal.  CONUS MEDULLARIS AND CAUDA EQUINA: Conus medullaris terminates at T12-L1 and demonstrates normal morphology and signal characteristics. Cauda equina is normal.  PARASPINAL AND OTHER SOFT TISSUES: Included prevertebral and paraspinal soft tissues are normal.  DISC LEVELS:  T12-L1: No disc bulge, canal stenosis nor neural foraminal narrowing.  L1-2: No disc bulge, canal stenosis nor neural foraminal narrowing. Small central annular fissure.  L2-3: No disc bulge, canal stenosis nor neural foraminal narrowing. Mild facet arthropathy.  L3-4: Small central disc protrusion superimposed 1 x 2 mm disc extrusion with 17 mm inferior migration. Mild facet arthropathy without canal stenosis or neural foraminal narrowing.  L4-5: No disc bulge, canal stenosis nor neural foraminal narrowing. Mild facet arthropathy.  L5-S1: Small central disc protrusion and RIGHT central  annular fissure. Minimal facet arthropathy without canal stenosis or neural foraminal narrowing.  IMPRESSION: 1. No acute process. 2. L3-4 disc extrusion.  Multilevel annular fissures. 3. No canal stenosis or neural foraminal narrowing.   Electronically Signed   By: Elon Alas M.D.   On: 03/19/2018 14:13     Lumbar DG (Complete)  4+V:  Results for orders placed during the hospital encounter of 02/22/15  DG Lumbar Spine Complete   Narrative CLINICAL DATA:  Right-sided low back pain for 1 week. No known injury.  EXAM: LUMBAR SPINE - COMPLETE 4+ VIEW  COMPARISON:  02/18/2014  FINDINGS: There are 5 non rib-bearing lumbar type vertebral bodies.  There is a mild scoliotic curvature (measuring approximately 6 degrees as measured from the superior endplate of Z61 to the inferior endplate of L4), unchanged since the 02/2014 examination. No anterolisthesis or retrolisthesis. No definite pars defects.  Lumbar vertebral body heights are preserved.  There is mild-to-moderate multilevel lumbar spine DDD, worse at L1-L2 and L2-L3 with disc space height loss, endplate irregularity and sclerosis.  Limited visualization the bilateral SI joints is normal.  Regional bowel gas pattern and soft tissues are normal.  IMPRESSION: 1. No acute findings. 2. Mild-to-moderate multilevel lumbar spine DDD, worse at L1-L2 and L2-L3, similar to the 02/2014 examination.   Electronically Signed   By: Sandi Mariscal M.D.   On: 02/23/2015 08:33     Complexity Note: Imaging results reviewed. Results shared with Mr. Delmundo, using Layman's terms.                         Meds   Current Outpatient Medications:  .  albuterol (PROVENTIL HFA;VENTOLIN HFA) 108 (90 Base) MCG/ACT inhaler, Inhale 2 puffs into the lungs every 6 (six) hours as needed for wheezing or shortness of breath., Disp: 1 Inhaler, Rfl: 3 .  ALPRAZolam (XANAX) 0.5 MG tablet, Take 1 tablet (0.5 mg total) by mouth daily as needed  for anxiety., Disp: 12 tablet, Rfl: 1 .  aspirin 81 MG tablet, Take 81 mg by mouth daily., Disp: , Rfl:  .  azelastine (ASTELIN) 0.1 % nasal spray, Place 2 sprays into both nostrils 2 (two) times daily., Disp: 30 mL, Rfl: 6 .  azelastine (OPTIVAR) 0.05 % ophthalmic solution, drop affect eye twice a day, Disp: 6 mL, Rfl: 3 .  Blood Glucose Monitoring Suppl (ONE TOUCH ULTRA SYSTEM KIT) w/Device KIT, 1 kit by Does not apply route once., Disp: 1 each, Rfl: 0 .  Canagliflozin-metFORMIN HCl (INVOKAMET) 150-500 MG TABS, Take 1 tablet by mouth 2 (two) times daily., Disp: 60 tablet, Rfl: 12 .  cyclobenzaprine (FLEXERIL) 5 MG tablet, Take 1 tablet (5 mg total) by mouth 3 (three) times daily as needed for muscle spasms., Disp: 30 tablet, Rfl: 1 .  dexlansoprazole (DEXILANT) 60 MG capsule, Take 1 capsule (60 mg total) by mouth daily., Disp: 30 capsule, Rfl: 12 .  gabapentin (NEURONTIN) 300 MG capsule, Take 1 capsule (300 mg total) by mouth 3 (three) times daily., Disp: 90 capsule, Rfl: 2 .  glipiZIDE (GLUCOTROL XL) 5 MG 24 hr tablet, Take 5 mg by mouth daily with breakfast., Disp: , Rfl:  .  glucose blood (ONE TOUCH ULTRA TEST) test strip, Check blood sugar 3 times daily E11.65 (uncontrolled insulin dependent diabetes), Disp: 100 each, Rfl: 12 .  mometasone (ELOCON) 0.1 % cream, One application daily as needed for rash, Disp: 45 g, Rfl: 1 .  nitroGLYCERIN (NITROSTAT) 0.4 MG SL tablet, Place 0.4 mg under the tongue every 5 (five) minutes as needed for chest pain. , Disp: , Rfl:  .  oxyCODONE (ROXICODONE) 15 MG immediate release tablet, Take 1 tablet (15 mg total) by mouth every 4 (four) hours as needed for pain., Disp: 120 tablet, Rfl: 0 .  pantoprazole (PROTONIX) 40 MG tablet,  Take 1 tablet (40 mg total) by mouth daily., Disp: 30 tablet, Rfl: 3 .  pioglitazone (ACTOS) 15 MG tablet, Take 1 tablet (15 mg total) by mouth daily., Disp: 30 tablet, Rfl: 12 .  QUEtiapine (SEROQUEL) 25 MG tablet, TAKE 1 TABLET (25 MG  TOTAL) BY MOUTH AT BEDTIME. FOR MOOD AND SLEEP, Disp: 30 tablet, Rfl: 0 .  ranitidine (ZANTAC) 150 MG capsule, Take 150 mg by mouth as needed for heartburn., Disp: , Rfl:  .  risperiDONE (RISPERDAL) 1 MG tablet, Take 1 tablet by mouth daily., Disp: , Rfl: 0 .  rosuvastatin (CRESTOR) 40 MG tablet, TAKE 1 TABLET(40 MG) BY MOUTH DAILY, Disp: 30 tablet, Rfl: 11 .  tiZANidine (ZANAFLEX) 4 MG capsule, Take 1 capsule (4 mg total) by mouth 2 (two) times daily as needed for muscle spasms., Disp: 60 capsule, Rfl: 0  ROS  Constitutional: Denies any fever or chills Gastrointestinal: No reported hemesis, hematochezia, vomiting, or acute GI distress Musculoskeletal: Denies any acute onset joint swelling, redness, loss of ROM, or weakness Neurological: No reported episodes of acute onset apraxia, aphasia, dysarthria, agnosia, amnesia, paralysis, loss of coordination, or loss of consciousness  Allergies  Mr. Commisso is allergic to montelukast; depakote [divalproex sodium]; singulair [montelukast sodium]; and atorvastatin.  PFSH  Drug: Mr. Failla  reports no history of drug use. Alcohol:  reports no history of alcohol use. Tobacco:  reports that he quit smoking about 4 years ago. His smoking use included cigarettes. He has a 7.50 pack-year smoking history. He has never used smokeless tobacco. Medical:  has a past medical history of Anxiety, Asthma, Bipolar 2 disorder ([place]), Coronary artery disease, Fatty liver, and History of MI (myocardial infarction). Surgical: Mr. Kassel  has a past surgical history that includes Nasal sinus surgery; Appendectomy; Cardiac catheterization (06/24/2013); Coronary angioplasty; Finger surgery; Coronary artery bypass graft (07/31/2013); Colonoscopy with propofol (N/A, 09/23/2014); and polypectomy (09/23/2014). Family: family history includes Diabetes in his father; Heart attack in his father; Heart disease in his father; Hyperlipidemia in his father and mother.  Constitutional Exam   General appearance: Well nourished, well developed, and well hydrated. In no apparent acute distress Vitals:   03/26/18 1354 03/26/18 1355  BP:  115/79  Pulse:  77  Resp: 16   Temp:  98 F (36.7 C)  SpO2:  100%  Weight: 200 lb (90.7 kg)   Height: '5\' 8"'  (1.727 m)    BMI Assessment: Estimated body mass index is 30.41 kg/m as calculated from the following:   Height as of this encounter: '5\' 8"'  (1.727 m).   Weight as of this encounter: 200 lb (90.7 kg).  BMI interpretation table: BMI level Category Range association with higher incidence of chronic pain  <18 kg/m2 Underweight   18.5-24.9 kg/m2 Ideal body weight   25-29.9 kg/m2 Overweight Increased incidence by 20%  30-34.9 kg/m2 Obese (Class I) Increased incidence by 68%  35-39.9 kg/m2 Severe obesity (Class II) Increased incidence by 136%  >40 kg/m2 Extreme obesity (Class III) Increased incidence by 254%   Patient's current BMI Ideal Body weight  Body mass index is 30.41 kg/m. Ideal body weight: 68.4 kg (150 lb 12.7 oz) Adjusted ideal body weight: 77.3 kg (170 lb 7.6 oz)   BMI Readings from Last 4 Encounters:  03/26/18 30.41 kg/m  03/25/18 31.78 kg/m  02/26/18 28.89 kg/m  02/22/18 31.64 kg/m   Wt Readings from Last 4 Encounters:  03/26/18 200 lb (90.7 kg)  03/25/18 209 lb (94.8 kg)  02/26/18 190 lb (  86.2 kg)  02/22/18 202 lb (91.6 kg)  Psych/Mental status: Alert, oriented x 3 (person, place, & time)       Eyes: PERLA Respiratory: No evidence of acute respiratory distress  Cervical Spine Area Exam  Skin & Axial Inspection: No masses, redness, edema, swelling, or associated skin lesions Alignment: Symmetrical Functional ROM: Unrestricted ROM      Stability: No instability detected Muscle Tone/Strength: Functionally intact. No obvious neuro-muscular anomalies detected. Sensory (Neurological): Unimpaired Palpation: No palpable anomalies              Upper Extremity (UE) Exam    Side: Right upper extremity  Side:  Left upper extremity  Skin & Extremity Inspection: Skin color, temperature, and hair growth are WNL. No peripheral edema or cyanosis. No masses, redness, swelling, asymmetry, or associated skin lesions. No contractures.  Skin & Extremity Inspection: Skin color, temperature, and hair growth are WNL. No peripheral edema or cyanosis. No masses, redness, swelling, asymmetry, or associated skin lesions. No contractures.  Functional ROM: Unrestricted ROM          Functional ROM: Unrestricted ROM          Muscle Tone/Strength: Functionally intact. No obvious neuro-muscular anomalies detected.  Muscle Tone/Strength: Functionally intact. No obvious neuro-muscular anomalies detected.  Sensory (Neurological): Unimpaired          Sensory (Neurological): Unimpaired          Palpation: No palpable anomalies              Palpation: No palpable anomalies              Provocative Test(s):  Phalen's test: deferred Tinel's test: deferred Apley's scratch test (touch opposite shoulder):  Action 1 (Across chest): deferred Action 2 (Overhead): deferred Action 3 (LB reach): deferred   Provocative Test(s):  Phalen's test: deferred Tinel's test: deferred Apley's scratch test (touch opposite shoulder):  Action 1 (Across chest): deferred Action 2 (Overhead): deferred Action 3 (LB reach): deferred    Thoracic Spine Area Exam  Skin & Axial Inspection: No masses, redness, or swelling Alignment: Symmetrical Functional ROM: Decreased ROM Stability: No instability detected Muscle Tone/Strength: Functionally intact. No obvious neuro-muscular anomalies detected. Sensory (Neurological): Musculoskeletal pain pattern Muscle strength & Tone: Complains of area being tender to palpation  Lumbar Spine Area Exam  Skin & Axial Inspection: No masses, redness, or swelling Alignment: Symmetrical Functional ROM: Decreased ROM affecting both sides Stability: No instability detected Muscle Tone/Strength: Functionally intact. No  obvious neuro-muscular anomalies detected. Sensory (Neurological): Musculoskeletal pain pattern Palpation: Complains of area being tender to palpation       Provocative Tests: Hyperextension/rotation test: (+) due to pain. Lumbar quadrant test (Kemp's test): (+) due to pain. Lateral bending test: (+) due to pain. Patrick's Maneuver: deferred today                   FABER* test: deferred today                   S-I anterior distraction/compression test: deferred today         S-I lateral compression test: deferred today         S-I Thigh-thrust test: deferred today         S-I Gaenslen's test: deferred today         *(Flexion, ABduction and External Rotation)  Gait & Posture Assessment  Ambulation: Unassisted Gait: Relatively normal for age and body habitus Posture: WNL   Lower Extremity Exam  Side: Right lower extremity  Side: Left lower extremity  Stability: No instability observed          Stability: No instability observed          Skin & Extremity Inspection: Skin color, temperature, and hair growth are WNL. No peripheral edema or cyanosis. No masses, redness, swelling, asymmetry, or associated skin lesions. No contractures.  Skin & Extremity Inspection: Skin color, temperature, and hair growth are WNL. No peripheral edema or cyanosis. No masses, redness, swelling, asymmetry, or associated skin lesions. No contractures.  Functional ROM: Unrestricted ROM                  Functional ROM: Unrestricted ROM                  Muscle Tone/Strength: Functionally intact. No obvious neuro-muscular anomalies detected.  Muscle Tone/Strength: Functionally intact. No obvious neuro-muscular anomalies detected.  Sensory (Neurological): Unimpaired        Sensory (Neurological): Unimpaired        DTR: Patellar: deferred today Achilles: deferred today Plantar: deferred today  DTR: Patellar: deferred today Achilles: deferred today Plantar: deferred today  Palpation: No palpable anomalies   Palpation: No palpable anomalies   Assessment & Plan  Primary Diagnosis & Pertinent Problem List: The primary encounter diagnosis was Left hip pain. Diagnoses of Chronic left SI joint pain, Chronic midline low back pain, unspecified whether sciatica present, Hx of CABG (chronic thoracic/chest wall pain), Current moderate episode of major depressive disorder without prior episode ([place]), and Chronic pain syndrome were also pertinent to this visit.  Visit Diagnosis: 1. Left hip pain   2. Chronic left SI joint pain   3. Chronic midline low back pain, unspecified whether sciatica present   4. Hx of CABG (chronic thoracic/chest wall pain)   5. Current moderate episode of major depressive disorder without prior episode ([place])   6. Chronic pain syndrome    Problems updated and reviewed during this visit: Problem  Left Hip Pain  Chronic Left Si Joint Pain  Hx of CABG (chronic thoracic/chest wall pain)    Patient follows up today for his second patient visit.  We reviewed his lumbar MRI in detail which was largely unremarkable.  No evidence of central canal stenosis, neuroforaminal stenosis or disc herniation.  Patient does have mild facet arthropathy at L4-L5, unlikely to be explaining his diffuse axial low back pain symptoms.  Had an extensive discussion with the patient about chronic opioid therapy.  I informed him that he would not be a candidate for chronic opioid therapy for the following reasons: No significant pathology on lumbar MRI to suggest such high-dose medications.  At this point, the patient's axial low back pain does not seem to be stemming from a lumbar spinal source.  I informed the patient that I am not discounting his pain at all and that his pain could be coming from other pain generators i.e. muscle, fascia, ligaments etc.  I also expressed my concern about his ED admission for SI on 01/08/2018.  At his ED visit, patient stated that he has a very firm commitment to killing himself  and was frustrated and angry that they would not prescribe him narcotics.  Patient did take extra Xanax the night before he presented to the ED.  I discussed the phenomena of opioid-induced hyperalgesia with the patient.  At this point, it is my impression that these medications are not helping the patient's pain but he is fearful of discontinuing them  given withdrawal symptoms that he may experience.  The patient is seeing a psychiatrist tomorrow for his depression and history of SI.   I clearly informed the patient that he would not be a candidate for chronic opioid therapy for reasons listed above and patient endorsed understanding.  Patient did contract to safety.  I informed the patient that I can help support him through non-opioid analgesics as well as gathering further diagnostic information to help understand the etiology of his pain.  I recommend the patient try and wean his chronic opioid medications by 1/2 tablet to 1 tablet every week.  This can be done in conjunction with the patient's primary care physician.  I will not be prescribing the patient opioid medications given that he is high risk in my opinion given poor coping, previous SI attempt, dysfunctional emotional process, high-dose opioid therapy for unclear etiology.  I will prescribe the patient tizanidine which is a muscle relaxant to help with any withdrawal symptoms that he may experience during the wean if he decides to go through with it.  I instructed the patient to not take this with Flexeril as both can result in sedation.  Given that the patient's lumbar MRI is largely unremarkable, may be worthwhile for the patient to see neurology and consider EMG/nerve conduction velocity studies to evaluate for any peripheral neuropathy.  Will also look at the patient's left hip and left sacroiliac joint (xrays ordered).  Depending upon results, can discuss interventional therapies such as left hip injection, left SI joint injection, left  piriformis injection.  Plan of Care   Lab-work, procedure(s), and/or referral(s): Orders Placed This Encounter  Procedures  . DG Si Joints  . DG HIP UNILAT W OR W/O PELVIS 2-3 VIEWS LEFT  . Ambulatory referral to Neurology    Provider-requested follow-up: Return in about 8 weeks (around 05/21/2018).  Future Appointments  Date Time Provider [place]  03/27/2018  2:30 PM [facility]  04/17/2018 11:00 AM Birdie Sons, MD [facility] None  05/21/2018 11:45 AM Gillis Santa, MD [facility] None    Primary Care Physician: Birdie Sons, MD Location: [facility] Note by: Gillis Santa, M.D Date: 03/26/2018; Time: 3:16 PM  Patient Instructions  Follow up with x-rays and with pain clinic as needed. Referral to neurology.

## 2018-03-26 NOTE — Telephone Encounter (Signed)
[place]  [facility] [phone]  Or [phone]  Needing clarification on pt's imaging order for tomorrow morning.  Will soft tissue organs or ascites needed to be imaged?  Please advise.  Thanks, [employer]

## 2018-03-26 NOTE — Patient Instructions (Addendum)
Follow up with x-rays and with pain clinic as needed. Referral to neurology.

## 2018-03-26 NOTE — Progress Notes (Signed)
Safety precautions to be maintained throughout the outpatient stay will include: orient to surroundings, keep bed in low position, maintain call bell within reach at all times, provide assistance with transfer out of bed and ambulation.  

## 2018-03-26 NOTE — Telephone Encounter (Signed)
Pt advised.   Thanks,   -[name]  

## 2018-03-26 NOTE — Telephone Encounter (Signed)
Need to see internal organs including livre, pancrease and gallbladder and also evaluated for possible ascites.

## 2018-03-27 ENCOUNTER — Ambulatory Visit: Payer: BLUE CROSS/BLUE SHIELD

## 2018-03-27 ENCOUNTER — Ambulatory Visit
Admission: RE | Admit: 2018-03-27 | Discharge: 2018-03-27 | Disposition: A | Discharge status: Home / Self Care | Payer: BLUE CROSS/BLUE SHIELD | Source: Ambulatory Visit | Attending: Family Medicine | Admitting: Family Medicine

## 2018-03-27 DIAGNOSIS — F3111 Bipolar disorder, current episode manic without psychotic features, mild: Secondary | ICD-10-CM | POA: Diagnosis not present

## 2018-03-27 DIAGNOSIS — R140 Abdominal distension (gaseous): Secondary | ICD-10-CM | POA: Diagnosis not present

## 2018-03-27 DIAGNOSIS — F411 Generalized anxiety disorder: Secondary | ICD-10-CM | POA: Diagnosis not present

## 2018-03-27 DIAGNOSIS — R1900 Intra-abdominal and pelvic swelling, mass and lump, unspecified site: Secondary | ICD-10-CM | POA: Insufficient documentation

## 2018-03-28 ENCOUNTER — Telehealth: Payer: Self-pay

## 2018-03-28 ENCOUNTER — Encounter: Payer: Self-pay | Admitting: Family Medicine

## 2018-03-28 DIAGNOSIS — K760 Fatty (change of) liver, not elsewhere classified: Secondary | ICD-10-CM | POA: Insufficient documentation

## 2018-03-28 NOTE — Telephone Encounter (Signed)
-----   Message from Birdie Sons, MD sent at 03/28/2018  9:14 AM EST ----- Labs all normal, a1c a little better at 7.9 (indicates average blood sugar is 180) Chest xr is normal  Abdominal ultrasound shows excessive amount of gas which is causing distension and probably causing his shortness of breath. Try taking OTC simethicone (Gas-X) 2-3 times a day ultrasound also shows excessive fat around liver. This is due to diabetes. Best treatment for this is pioglitazone which he is already taking. Otherwise ultrasound is normal.  If abdominal swelling worsens or does not improve over the next week, will need to order abdominal CT.

## 2018-03-28 NOTE — Telephone Encounter (Signed)
Pt advised.  He says he stopped drinking green tea and that seems to help his abdominal swelling.     Thanks,   -Mickel Baas

## 2018-03-29 ENCOUNTER — Other Ambulatory Visit: Payer: Self-pay | Admitting: Psychiatry

## 2018-03-29 ENCOUNTER — Other Ambulatory Visit: Payer: Self-pay | Admitting: Family Medicine

## 2018-03-29 DIAGNOSIS — M549 Dorsalgia, unspecified: Secondary | ICD-10-CM

## 2018-04-01 ENCOUNTER — Other Ambulatory Visit: Payer: Self-pay

## 2018-04-01 DIAGNOSIS — M545 Low back pain: Principal | ICD-10-CM

## 2018-04-01 DIAGNOSIS — G8929 Other chronic pain: Secondary | ICD-10-CM

## 2018-04-01 MED ORDER — OXYCODONE HCL 15 MG PO TABS: 15.0000 mg | ORAL_TABLET | ORAL | 0 refills | Status: DC | PRN

## 2018-04-01 NOTE — Telephone Encounter (Signed)
Ok, he can have one Humalog sample pen.  I though his new psychiatrist took him off Lamictal. Is he not able to get the psychiatrist to take care of this.... I dont want to mess up his medications.

## 2018-04-01 NOTE — Telephone Encounter (Signed)
Patient called back. He is requesting a sample of a insulin pen that's "fast acting" Patient states he is going out of town to [place] and would like to take a insulin pen with him incase he "eats to much". Patient states Dr. Caryn Section has gave him Humalog previously.

## 2018-04-01 NOTE — Telephone Encounter (Signed)
Patient is requesting a refill on Oxycodone and Lamictal 25 mg BID- previously prescribed by psychiatrist. [facility]. CB#[phone]

## 2018-04-02 NOTE — Telephone Encounter (Signed)
Pt advised.  He is going to have his new psychiatrist handle the Lamictal.   Thanks,   -Mickel Baas

## 2018-04-17 ENCOUNTER — Ambulatory Visit (INDEPENDENT_AMBULATORY_CARE_PROVIDER_SITE_OTHER): Payer: BLUE CROSS/BLUE SHIELD | Admitting: Family Medicine

## 2018-04-17 ENCOUNTER — Encounter: Payer: Self-pay | Admitting: Family Medicine

## 2018-04-17 ENCOUNTER — Other Ambulatory Visit: Payer: Self-pay

## 2018-04-17 VITALS — BP 110/76 | HR 66 | Temp 98.0°F | Ht 68.0 in | Wt 204.0 lb

## 2018-04-17 DIAGNOSIS — K5909 Other constipation: Secondary | ICD-10-CM | POA: Diagnosis not present

## 2018-04-17 DIAGNOSIS — F419 Anxiety disorder, unspecified: Secondary | ICD-10-CM

## 2018-04-17 DIAGNOSIS — F332 Major depressive disorder, recurrent severe without psychotic features: Secondary | ICD-10-CM

## 2018-04-17 MED ORDER — ALPRAZOLAM 0.5 MG PO TABS: 0.5000 mg | ORAL_TABLET | Freq: Every day | ORAL | 1 refills | Status: DC | PRN

## 2018-04-17 NOTE — Progress Notes (Signed)
Patient: Vincent Hall Male    DOB: [birth year]   53 y.o.   MRN: [id number] Visit Date: 04/17/2018  Today's Provider: [name], MD   Chief Complaint  Patient presents with  . Follow-up    6 week  constipation   . medication follow up    pt wants to speak to doctor about medications   Subjective:     HPI  Constipation Follow up   He states that since his last visit he has restarted Linzess which has been working very well. He had previously tried Designer, fashion/clothing, neither one of which was effective. He is having no adverse effects and would like to continue same regiment of Linzess. He states abdominal swelling has greatly improved since change of medications.   He also reports that when he went to [place] over Christmas he started feeling very withdrawn and depressed, and felt that psychiatric medications were to blame. He had recently weaned off of sertraline per his psychiatrist recommendations due to ED. He had also been started on Risperdal by his psychiatrist in early December, but he stopped it when he was in [place] because he thought it was making him much more depressed. He had been prescribed Seroquel by previously psychiatrist for sleep, and reports it was effective, but higher dosage caused him to have even more trouble sleeping so he has since cut back to 1/2 tablet at bedtime. He did not bring his medications with him today and thinks there may have been other medications that his psychiatrist prescribed but he can't remember for sure.    He states his mood has improved quite a bit since returning from [place], but has to take occasional alprazolam due to anxiety.   ------------------------------------------------------------------------------------    Allergies  Allergen Reactions  . Montelukast Hives    Singular  . Depakote [Divalproex Sodium] Rash  . Singulair [Montelukast Sodium] Hives  . Atorvastatin Other (See Comments)    Muscle  pain, neck stiffness     Current Outpatient Medications:  .  albuterol (PROVENTIL HFA;VENTOLIN HFA) 108 (90 Base) MCG/ACT inhaler, Inhale 2 puffs into the lungs every 6 (six) hours as needed for wheezing or shortness of breath., Disp: 1 Inhaler, Rfl: 3 .  ALPRAZolam (XANAX) 0.5 MG tablet, Take 1 tablet (0.5 mg total) by mouth daily as needed for anxiety., Disp: 12 tablet, Rfl: 1 .  aspirin 81 MG tablet, Take 81 mg by mouth daily., Disp: , Rfl:  .  azelastine (ASTELIN) 0.1 % nasal spray, Place 2 sprays into both nostrils 2 (two) times daily., Disp: 30 mL, Rfl: 6 .  azelastine (OPTIVAR) 0.05 % ophthalmic solution, drop affect eye twice a day, Disp: 6 mL, Rfl: 3 .  Blood Glucose Monitoring Suppl (ONE TOUCH ULTRA SYSTEM KIT) w/Device KIT, 1 kit by Does not apply route once., Disp: 1 each, Rfl: 0 .  Canagliflozin-metFORMIN HCl (INVOKAMET) 150-500 MG TABS, Take 1 tablet by mouth 2 (two) times daily., Disp: 60 tablet, Rfl: 12 .  cyclobenzaprine (FLEXERIL) 5 MG tablet, TAKE ONE TABLET BY MOUTH THREE TIMES A DAY AS NEEDED FOR MUSCLE SPASMS, Disp: 30 tablet, Rfl: 12 .  dexlansoprazole (DEXILANT) 60 MG capsule, Take 1 capsule (60 mg total) by mouth daily., Disp: 30 capsule, Rfl: 12 .  gabapentin (NEURONTIN) 300 MG capsule, Take 1 capsule (300 mg total) by mouth 3 (three) times daily., Disp: 90 capsule, Rfl: 2 .  glipiZIDE (GLUCOTROL XL) 5 MG 24 hr tablet, Take  5 mg by mouth daily with breakfast., Disp: , Rfl:  .  glucose blood (ONE TOUCH ULTRA TEST) test strip, Check blood sugar 3 times daily E11.65 (uncontrolled insulin dependent diabetes), Disp: 100 each, Rfl: 12 .  mometasone (ELOCON) 0.1 % cream, One application daily as needed for rash, Disp: 45 g, Rfl: 1 .  nitroGLYCERIN (NITROSTAT) 0.4 MG SL tablet, Place 0.4 mg under the tongue every 5 (five) minutes as needed for chest pain. , Disp: , Rfl:  .  oxyCODONE (ROXICODONE) 15 MG immediate release tablet, Take 1 tablet (15 mg total) by mouth every 4 (four)  hours as needed for pain., Disp: 120 tablet, Rfl: 0 .  pantoprazole (PROTONIX) 40 MG tablet, Take 1 tablet (40 mg total) by mouth daily., Disp: 30 tablet, Rfl: 3 .  pioglitazone (ACTOS) 15 MG tablet, Take 1 tablet (15 mg total) by mouth daily., Disp: 30 tablet, Rfl: 12 .  QUEtiapine (SEROQUEL) 25 MG tablet, TAKE 1 TABLET (25 MG TOTAL) BY MOUTH AT BEDTIME. FOR MOOD AND SLEEP (Patient taking differently: Take 12.5-25 mg by mouth at bedtime. For mood and sleep), Disp: 30 tablet, Rfl: 0 .  ranitidine (ZANTAC) 150 MG capsule, Take 150 mg by mouth as needed for heartburn., Disp: , Rfl:  .  rosuvastatin (CRESTOR) 40 MG tablet, TAKE 1 TABLET(40 MG) BY MOUTH DAILY, Disp: 30 tablet, Rfl: 11 .  tiZANidine (ZANAFLEX) 4 MG capsule, Take 1 capsule (4 mg total) by mouth 2 (two) times daily as needed for muscle spasms., Disp: 60 capsule, Rfl: 0 .  risperiDONE (RISPERDAL) 1 MG tablet, Take 1 tablet by mouth daily., Disp: , Rfl: 0  (PATIENT NOT TAKING)  Review of Systems  Constitutional: Negative for appetite change, chills and fever.  Respiratory: Negative for chest tightness, shortness of breath and wheezing.   Cardiovascular: Negative for chest pain and palpitations.  Gastrointestinal: Negative for abdominal pain, nausea and vomiting.    Social History   Tobacco Use  . Smoking status: Former Smoker    Packs/day: 0.25    Years: 30.00    Pack years: 7.50    Types: Cigarettes    Last attempt to quit: 08/08/2013    Years since quitting: 4.6  . Smokeless tobacco: Never Used  Substance Use Topics  . Alcohol use: No      Objective:   BP 110/76 (BP Location: Left Arm, Patient Position: Sitting, Cuff Size: Normal)   Pulse 66   Temp 98 F (36.7 C) (Oral)   Ht _0  (1.727 m)   Wt 204 lb (92.5 kg)   SpO2 98%   BMI 31.02 kg/m    Physical Exam  General appearance: alert, well developed, well nourished, cooperative and in no distress Head: Normocephalic, without obvious abnormality,  atraumatic Respiratory: Respirations even and unlabored, normal respiratory rate Extremities: No gross deformities Skin: Skin color, texture, turgor normal. No rashes seen  Psych: Appropriate mood and affect. Neurologic: Mental status: Alert, oriented to person, place, and time, thought content appropriate.     Assessment & Plan    1. Severe recurrent major depression without psychotic features ([place]) Had significant flare up during recent trip to [facility] which he attributes to Risperdal being too strong, and is no longer taking. Off of sertraline due to sexual side effects. Much better now, but likely needs to get back on antidepressant for long term control and encouraged to set up follow up with his psychiatrist. He is also going to bring a list of of medications his  psychiatrist prescribed for me to review.   2. Anxiety Doing well with occasional alprazolam which was refilled today.   3. Chronic constipation Much better since change to Linzess. Continue current medications.    Follow up 3 months for DM follow up.     [name], MD  [facility]

## 2018-04-17 NOTE — Patient Instructions (Signed)
.   Please bring all of your medications to every appointment so we can make sure that our medication list is the same as yours.   

## 2018-04-18 ENCOUNTER — Other Ambulatory Visit: Payer: Self-pay

## 2018-04-18 NOTE — Telephone Encounter (Signed)
Patient is requesting that we send in a prescription for a new glucose monitor and test strips. He says he forgot to mention this to you during yesterdays office visit. He says he never filled the prescription you wrote him in the past; instead he has been using an old meter that was given to him. I wasn't able to pull the glucose monitor in the current orders because the system says the medical record for that order is no longer available, and a new order has to be placed. Patient says he needs the One Touch Ultra glucose monitor and test strips sent to [facility].  Please advise.

## 2018-04-19 MED ORDER — ONETOUCH ULTRA 2 W/DEVICE KIT: PACK | 0 refills | Status: DC

## 2018-04-19 MED ORDER — GLUCOSE BLOOD VI STRP: ORAL_STRIP | 12 refills | Status: DC

## 2018-04-24 ENCOUNTER — Telehealth: Payer: Self-pay | Admitting: Family Medicine

## 2018-04-24 DIAGNOSIS — K5904 Chronic idiopathic constipation: Secondary | ICD-10-CM

## 2018-04-24 MED ORDER — PLECANATIDE 3 MG PO TABS: 3.0000 mg | ORAL_TABLET | Freq: Every day | ORAL | 5 refills | Status: DC

## 2018-04-24 NOTE — Telephone Encounter (Signed)
Pt advised.  RX sent to [facility].  Thanks,   -[name]  

## 2018-04-24 NOTE — Telephone Encounter (Signed)
Please advise patient that his insurance requires a trial of Trulance for constipation before they will cover Linzess. This medication is very similar to [place] and usually works just as well. Can send prescription to the pharmacy of his choice and let me know if any trouble with it.

## 2018-04-30 ENCOUNTER — Encounter: Payer: Self-pay | Admitting: Family Medicine

## 2018-04-30 ENCOUNTER — Ambulatory Visit (INDEPENDENT_AMBULATORY_CARE_PROVIDER_SITE_OTHER): Payer: BLUE CROSS/BLUE SHIELD | Admitting: Family Medicine

## 2018-04-30 ENCOUNTER — Other Ambulatory Visit (HOSPITAL_COMMUNITY)
Admission: RE | Admit: 2018-04-30 | Discharge: 2018-04-30 | Disposition: A | Discharge status: Home / Self Care | Payer: BLUE CROSS/BLUE SHIELD | Source: Ambulatory Visit | Attending: Family Medicine | Admitting: Family Medicine

## 2018-04-30 ENCOUNTER — Other Ambulatory Visit: Payer: Self-pay

## 2018-04-30 VITALS — BP 126/80 | Temp 97.8°F | Ht 68.0 in | Wt 207.2 lb

## 2018-04-30 DIAGNOSIS — M545 Low back pain: Secondary | ICD-10-CM | POA: Insufficient documentation

## 2018-04-30 DIAGNOSIS — N481 Balanitis: Secondary | ICD-10-CM

## 2018-04-30 DIAGNOSIS — G8929 Other chronic pain: Secondary | ICD-10-CM | POA: Insufficient documentation

## 2018-04-30 DIAGNOSIS — E1165 Type 2 diabetes mellitus with hyperglycemia: Secondary | ICD-10-CM

## 2018-04-30 MED ORDER — DOXYCYCLINE HYCLATE 100 MG PO TABS: 100.0000 mg | ORAL_TABLET | Freq: Two times a day (BID) | ORAL | 0 refills | Status: DC

## 2018-04-30 MED ORDER — METFORMIN HCL 500 MG PO TABS: 500.0000 mg | ORAL_TABLET | Freq: Two times a day (BID) | ORAL | 1 refills | Status: DC

## 2018-04-30 MED ORDER — OXYCODONE HCL 15 MG PO TABS: 15.0000 mg | ORAL_TABLET | ORAL | 0 refills | Status: DC | PRN

## 2018-04-30 MED ORDER — KETOCONAZOLE 2 % EX CREA: 1.0000 "application " | TOPICAL_CREAM | Freq: Every day | CUTANEOUS | 0 refills | Status: DC

## 2018-04-30 NOTE — Progress Notes (Signed)
Patient: Vincent Hall Male    DOB: Mar 04, 1966   53 y.o.   MRN: [id number] Visit Date: 04/30/2018  Today's Provider: [name], MD   No chief complaint on file.  Subjective:    HPI  Pt reports that on shaft of penis there are cuts and dry and sore.  Have had some swelling on sat and sun.  Started on Thursday  04/25/18. Has been using OTC neosporin ointment with some improvement. She also reports she had intercourse a few weeks ago for the first time in a few years and is concerned it may have aggravated symptoms. No discharge, no dysuria.  Allergies  Allergen Reactions  . Montelukast Hives    Singular  . Depakote [Divalproex Sodium] Rash  . Singulair [Montelukast Sodium] Hives  . Atorvastatin Other (See Comments)    Muscle pain, neck stiffness     Current Outpatient Medications:  .  albuterol (PROVENTIL HFA;VENTOLIN HFA) 108 (90 Base) MCG/ACT inhaler, Inhale 2 puffs into the lungs every 6 (six) hours as needed for wheezing or shortness of breath., Disp: 1 Inhaler, Rfl: 3 .  ALPRAZolam (XANAX) 0.5 MG tablet, Take 1 tablet (0.5 mg total) by mouth daily as needed for anxiety., Disp: 20 tablet, Rfl: 1 .  aspirin 81 MG tablet, Take 81 mg by mouth daily., Disp: , Rfl:  .  azelastine (ASTELIN) 0.1 % nasal spray, Place 2 sprays into both nostrils 2 (two) times daily., Disp: 30 mL, Rfl: 6 .  azelastine (OPTIVAR) 0.05 % ophthalmic solution, drop affect eye twice a day, Disp: 6 mL, Rfl: 3 .  Blood Glucose Monitoring Suppl (ONE TOUCH ULTRA 2) w/Device KIT, Use to check blood sugar daily, Disp: 1 each, Rfl: 0 .  Canagliflozin-metFORMIN HCl (INVOKAMET) 150-500 MG TABS, Take 1 tablet by mouth 2 (two) times daily., Disp: 60 tablet, Rfl: 12 .  cyclobenzaprine (FLEXERIL) 5 MG tablet, TAKE ONE TABLET BY MOUTH THREE TIMES A DAY AS NEEDED FOR MUSCLE SPASMS, Disp: 30 tablet, Rfl: 12 .  dexlansoprazole (DEXILANT) 60 MG capsule, Take 1 capsule (60 mg total) by mouth daily., Disp: 30  capsule, Rfl: 12 .  gabapentin (NEURONTIN) 300 MG capsule, Take 1 capsule (300 mg total) by mouth 3 (three) times daily., Disp: 90 capsule, Rfl: 2 .  glipiZIDE (GLUCOTROL XL) 5 MG 24 hr tablet, Take 5 mg by mouth daily with breakfast., Disp: , Rfl:  .  glucose blood (ONE TOUCH ULTRA TEST) test strip, Check blood sugar 3 times daily E11.65 (uncontrolled insulin dependent diabetes), Disp: 100 each, Rfl: 12 .  mometasone (ELOCON) 0.1 % cream, One application daily as needed for rash, Disp: 45 g, Rfl: 1 .  nitroGLYCERIN (NITROSTAT) 0.4 MG SL tablet, Place 0.4 mg under the tongue every 5 (five) minutes as needed for chest pain. , Disp: , Rfl:  .  oxyCODONE (ROXICODONE) 15 MG immediate release tablet, Take 1 tablet (15 mg total) by mouth every 4 (four) hours as needed for pain., Disp: 120 tablet, Rfl: 0 .  pantoprazole (PROTONIX) 40 MG tablet, Take 1 tablet (40 mg total) by mouth daily., Disp: 30 tablet, Rfl: 3 .  pioglitazone (ACTOS) 15 MG tablet, Take 1 tablet (15 mg total) by mouth daily., Disp: 30 tablet, Rfl: 12 .  Plecanatide (TRULANCE) 3 MG TABS, Take 3 mg by mouth daily., Disp: 30 tablet, Rfl: 5 .  QUEtiapine (SEROQUEL) 25 MG tablet, TAKE 1 TABLET (25 MG TOTAL) BY MOUTH AT BEDTIME. FOR MOOD AND SLEEP (  Patient taking differently: Take 12.5-25 mg by mouth at bedtime. For mood and sleep), Disp: 30 tablet, Rfl: 0 .  ranitidine (ZANTAC) 150 MG capsule, Take 150 mg by mouth as needed for heartburn., Disp: , Rfl:  .  risperiDONE (RISPERDAL) 1 MG tablet, Take 1 tablet by mouth daily., Disp: , Rfl: 0 .  rosuvastatin (CRESTOR) 40 MG tablet, TAKE 1 TABLET(40 MG) BY MOUTH DAILY, Disp: 30 tablet, Rfl: 11 .  tiZANidine (ZANAFLEX) 4 MG capsule, Take 1 capsule (4 mg total) by mouth 2 (two) times daily as needed for muscle spasms., Disp: 60 capsule, Rfl: 0  Review of Systems  Constitutional: Negative.   HENT: Negative.   Eyes: Negative.   Respiratory: Negative.   Cardiovascular: Negative.   Gastrointestinal:  Negative.   Endocrine: Negative.   Genitourinary: Positive for penile pain (cuts and dryness on shaft of penis) and penile swelling.  Musculoskeletal: Negative.   Skin: Negative.   Allergic/Immunologic: Negative.   Neurological: Negative.   Hematological: Negative.   Psychiatric/Behavioral: Negative.     Social History   Tobacco Use  . Smoking status: Former Smoker    Packs/day: 0.25    Years: 30.00    Pack years: 7.50    Types: Cigarettes    Last attempt to quit: 08/08/2013    Years since quitting: 4.7  . Smokeless tobacco: Never Used  Substance Use Topics  . Alcohol use: No      Objective:   BP 126/80 (BP Location: Right Arm, Patient Position: Sitting, Cuff Size: Normal)   Temp 97.8 F (36.6 C) (Oral)   Ht '5\' 8"'  (1.727 m)   Wt 207 lb 3.2 oz (94 kg)   BMI 31.50 kg/m     Physical Exam  General appearance: alert, well developed, well nourished, cooperative and in no distress Head: Normocephalic, without obvious abnormality, atraumatic GU: Mildly inflamed slightly swollen foreskin with several shallow linear breaks in surrounding skin. No discharge. No erythema of urethra.      Assessment & Plan    1. Balanitis  - doxycycline (VIBRA-TABS) 100 MG tablet; Take 1 tablet (100 mg total) by mouth 2 (two) times daily.  Dispense: 7 tablet; Refill: 0 - ketoconazole (NIZORAL) 2 % cream; Apply 1 application topically daily.  Dispense: 15 g; Refill: 0 - Urine cytology ancillary only - Aerobic culture May be exacerbated by his SGLP2 inhibitor which he is to put on hold. Started on metformin alone as below. May have been precipitated by recent resumption of sexual intercourse.  Consider restarting lower dose of canagliflozin when sx resolve  2. Chronic midline low back pain without sciatica refill- oxyCODONE (ROXICODONE) 15 MG immediate release tablet; Take 1 tablet (15 mg total) by mouth every 4 (four) hours as needed for pain.  Dispense: 120 tablet; Refill: 0 - Urine  cytology ancillary only  3. Uncontrolled type 2 diabetes mellitus with hyperglycemia ([place]) Putting Invokana on hold for now, start- metFORMIN (GLUCOPHAGE) 500 MG tablet; Take 1 tablet (500 mg total) by mouth 2 (two) times daily with a meal.  Dispense: 60 tablet; Refill: 1  Consider lower dose of canagliflozin at follow up.      [name], MD  [facility]

## 2018-04-30 NOTE — Patient Instructions (Signed)
.   Stop Invokamet for the time being   Start metformin 500mg  twice a day for now  . Please bring all of your medications to every appointment so we can make sure that our medication list is the same as yours.

## 2018-05-02 LAB — URINE CYTOLOGY ANCILLARY ONLY
Chlamydia: NEGATIVE
Neisseria Gonorrhea: NEGATIVE

## 2018-05-03 LAB — AEROBIC CULTURE

## 2018-05-06 ENCOUNTER — Telehealth: Payer: Self-pay

## 2018-05-06 NOTE — Telephone Encounter (Signed)
Patient advised as below.  

## 2018-05-06 NOTE — Telephone Encounter (Signed)
-----   Message from Birdie Sons, MD sent at 05/04/2018  9:44 PM EST ----- Culture shows staph infection that is sensitive to doxycycline that was prescribed. Need to finish doxycycline. Stay off of Invokamet for now. Call if not completely cleared up with finished with doxycycline.  Schedule follow up for diabetes in 6 weeks.

## 2018-05-08 DIAGNOSIS — F411 Generalized anxiety disorder: Secondary | ICD-10-CM | POA: Diagnosis not present

## 2018-05-08 DIAGNOSIS — F3111 Bipolar disorder, current episode manic without psychotic features, mild: Secondary | ICD-10-CM | POA: Diagnosis not present

## 2018-05-09 ENCOUNTER — Telehealth: Payer: Self-pay | Admitting: Family Medicine

## 2018-05-09 MED ORDER — NITROGLYCERIN 0.4 MG SL SUBL: 0.4000 mg | SUBLINGUAL_TABLET | SUBLINGUAL | 1 refills | Status: DC | PRN

## 2018-05-09 MED ORDER — DEXLANSOPRAZOLE 60 MG PO CPDR: 60.0000 mg | DELAYED_RELEASE_CAPSULE | Freq: Every day | ORAL | 12 refills | Status: DC

## 2018-05-09 NOTE — Telephone Encounter (Signed)
Patient needs refill on Generic Aciphex and his Nitroglycerin Rx has expired.  Call to [place]

## 2018-05-09 NOTE — Telephone Encounter (Signed)
Pt having nausea and vomiting.  Needing advice on what he needs to do to calm this down.  Please call pt back to discuss.  Thanks, [employer]

## 2018-05-09 NOTE — Telephone Encounter (Signed)
Pt wanting a refill on: RABEPRAZOLE 20 MG  Please fill at: [place], [place] [phone] (Phone) [phone] (Fax)   Thanks, [place]

## 2018-05-09 NOTE — Telephone Encounter (Signed)
I don't see this on his med list.  Please advise.   Thanks,   -[name]  

## 2018-05-10 ENCOUNTER — Telehealth: Payer: Self-pay | Admitting: Family Medicine

## 2018-05-10 ENCOUNTER — Other Ambulatory Visit: Payer: Self-pay | Admitting: Thoracic Surgery

## 2018-05-10 ENCOUNTER — Ambulatory Visit
Admission: RE | Admit: 2018-05-10 | Discharge: 2018-05-10 | Disposition: A | Discharge status: Home / Self Care | Payer: Disability Insurance | Attending: Thoracic Surgery | Admitting: Thoracic Surgery

## 2018-05-10 ENCOUNTER — Ambulatory Visit
Admission: RE | Admit: 2018-05-10 | Discharge: 2018-05-10 | Disposition: A | Discharge status: Home / Self Care | Payer: Disability Insurance | Source: Ambulatory Visit | Attending: Thoracic Surgery | Admitting: Thoracic Surgery

## 2018-05-10 DIAGNOSIS — M545 Low back pain, unspecified: Secondary | ICD-10-CM

## 2018-05-10 MED ORDER — RABEPRAZOLE SODIUM 20 MG PO TBEC: 20.0000 mg | DELAYED_RELEASE_TABLET | Freq: Every day | ORAL | 3 refills | Status: DC

## 2018-05-10 MED ORDER — AMOXICILLIN-POT CLAVULANATE 875-125 MG PO TABS: 1.0000 | ORAL_TABLET | Freq: Two times a day (BID) | ORAL | 0 refills | Status: AC

## 2018-05-10 NOTE — Telephone Encounter (Signed)
He just had Dexilant filled on the 17th, Dexilant and rabeprazole are the same type of medication. He can't both.

## 2018-05-10 NOTE — Telephone Encounter (Signed)
Pt UTI symptoms have returned.  His last antibiotic was taken on Wed.  Please advise.  Thanks, [employer]

## 2018-05-10 NOTE — Telephone Encounter (Signed)
Advised the patient as below. He reports that he is having to take Dexilant 60mg  BID to help with his stomach pain, and it still does not resolve. He reports that a few days ago he tried Zantac along with Dexliant with no relief. When he took [place] and Aciphex, he reports that his symptoms improved. He describes his symptoms as a burning sensation in his abdomen. Patient is requesting Aciphex to be sent into the pharmacy being that it helped him the most. [facility]. Thanks!

## 2018-05-10 NOTE — Telephone Encounter (Signed)
Pt also requests a refill forRABEPRAZOLE 20 MG.  He states the Doxycycline made caused nausea and vomiting.  He had some of the above medication left over from another time.  He took what he had left and it help him but would like a refill sent to [facility] if possible.    Please advise.    Thanks,   -Mickel Baas

## 2018-05-10 NOTE — Telephone Encounter (Signed)
Pt was seen in the office 04/30/2018 for Balanitis.  Does he need to come back in?   Please advise.   Thanks,   -Mickel Baas

## 2018-05-10 NOTE — Telephone Encounter (Signed)
Change antibiotic to Augmentin. Please send to pharmacy of his choice.

## 2018-05-10 NOTE — Telephone Encounter (Signed)
Pt calling back because he is still having the UTI symptoms and stomach issues (vomitting and not able to eat).  Wanting to know what Dr. Caryn Section advises and if he is going to call him in some more antibiotics for his UTI.  Please advise today if possible.  Thanks, [employer]

## 2018-05-16 ENCOUNTER — Other Ambulatory Visit: Payer: Self-pay | Admitting: Family Medicine

## 2018-05-21 ENCOUNTER — Encounter: Payer: Self-pay | Admitting: Student in an Organized Health Care Education/Training Program

## 2018-05-21 ENCOUNTER — Other Ambulatory Visit: Payer: Self-pay

## 2018-05-21 ENCOUNTER — Ambulatory Visit
Payer: BLUE CROSS/BLUE SHIELD | Attending: Student in an Organized Health Care Education/Training Program | Admitting: Student in an Organized Health Care Education/Training Program

## 2018-05-21 VITALS — BP 119/79 | HR 69 | Temp 98.3°F | Resp 16 | Ht 68.0 in | Wt 200.0 lb

## 2018-05-21 DIAGNOSIS — M545 Low back pain, unspecified: Secondary | ICD-10-CM

## 2018-05-21 DIAGNOSIS — G8929 Other chronic pain: Secondary | ICD-10-CM

## 2018-05-21 DIAGNOSIS — M533 Sacrococcygeal disorders, not elsewhere classified: Secondary | ICD-10-CM | POA: Insufficient documentation

## 2018-05-21 DIAGNOSIS — Z951 Presence of aortocoronary bypass graft: Secondary | ICD-10-CM | POA: Diagnosis not present

## 2018-05-21 DIAGNOSIS — G894 Chronic pain syndrome: Secondary | ICD-10-CM | POA: Diagnosis not present

## 2018-05-21 DIAGNOSIS — M25552 Pain in left hip: Secondary | ICD-10-CM | POA: Insufficient documentation

## 2018-05-21 NOTE — Progress Notes (Signed)
Patient's Name: Vincent Hall  MRN: [id number]  Referring Provider: Birdie Sons, MD  DOB: [birth year]  PCP: Birdie Sons, MD  DOS: 05/21/2018  Note by: Gillis Santa, MD  Service setting: Ambulatory outpatient  Specialty: Interventional Pain Management  Location: [facility] (AMB) Pain Management Facility    Patient type: Established   Primary Reason(s) for Visit: Evaluation of chronic illnesses with exacerbation, or progression (Level of risk: moderate) CC: Back Pain (lower)  HPI  Vincent Hall is a 53 y.o. year old, male patient, who comes today for a follow-up evaluation. He has Coronary artery disease; Chronic idiopathic constipation; Esophageal reflux; Esophageal spasm; Allergic rhinitis; Anxiety; Arthralgia; Chest wall pain; Dysphagia; Fatigue; History of tobacco use; Hyperlipidemia, mixed; Insomnia; Current moderate episode of major depressive disorder without prior episode [facility]); STEMI (ST elevation myocardial infarction) ([place]); Thrush, oral; Suicide attempt ([place]); Benzodiazepine overdose; Severe recurrent major depression without psychotic features ([place]); Chronic pain; Depression; Left hip pain; Chronic left SI joint pain; Hx of CABG (chronic thoracic/chest wall pain); and Fatty liver on their problem list. Mr. Weible was last seen on 03/26/2018. His primarily concern today is the Back Pain (lower)  Pain Assessment: Location: Lower Back Radiating: radiates into both hips and legs Onset: More than a month ago Duration: Chronic pain Quality: (annoying) Severity: 2 /10 (subjective, self-reported pain score)  Note: Reported level is compatible with observation.                         When using our objective Pain Scale, levels between 6 and 10/10 are said to belong in an emergency room, as it progressively worsens from a 6/10, described as severely limiting, requiring emergency care not usually available at an outpatient pain management facility. At a 6/10 level, communication becomes  difficult and requires great effort. Assistance to reach the emergency department may be required. Facial flushing and profuse sweating along with potentially dangerous increases in heart rate and blood pressure will be evident. Effect on ADL: limits daily activities Timing: Constant Modifying factors: oxycodone BP: 119/79  HR: 69  Further details on both, my assessment(s), as well as the proposed treatment plan, please see below.  Patient is unclear as to why he is here.  He was unable to get his hip and SI joint x-rays as ordered at the last visit.  At this time, I told the patient that I do not have much to offer him in terms of interventional therapies especially since he was unable to get his SI joint and hip x-rays.  He states that those regions are feeling better than when he saw me at the last visit.  Patient can follow-up PRN.  Laboratory Chemistry  Inflammation Markers (CRP: Acute Phase) (ESR: Chronic Phase) No results found for: CRP, ESRSEDRATE, LATICACIDVEN                       Rheumatology Markers No results found for: RF, ANA, LABURIC, URICUR, LYMEIGGIGMAB, LYMEABIGMQN, HLAB27                      Renal Function Markers Lab Results  Component Value Date   BUN 10 03/25/2018   CREATININE 1.06 03/25/2018   BCR 9 03/25/2018   GFRAA 92 03/25/2018   GFRNONAA 79 03/25/2018  Hepatic Function Markers Lab Results  Component Value Date   AST 25 03/25/2018   ALT 29 03/25/2018   ALBUMIN 4.4 03/25/2018   ALKPHOS 57 03/25/2018                        Electrolytes Lab Results  Component Value Date   NA 137 03/25/2018   K 4.4 03/25/2018   CL 100 03/25/2018   CALCIUM 9.9 03/25/2018   PHOS 3.0 03/10/2016                        Neuropathy Markers Lab Results  Component Value Date   HGBA1C 7.9 (H) 03/25/2018                        CNS Tests No results found for: COLORCSF, APPEARCSF, RBCCOUNTCSF, WBCCSF, POLYSCSF, LYMPHSCSF, EOSCSF,  PROTEINCSF, GLUCCSF, JCVIRUS, CSFOLI, IGGCSF                      Bone Pathology Markers No results found for: VD25OH, [id number], [id number], [id number], 25OHVITD1, 25OHVITD2, 25OHVITD3, TESTOFREE, TESTOSTERONE                       Coagulation Parameters Lab Results  Component Value Date   PLT 216 03/25/2018                        Cardiovascular Markers Lab Results  Component Value Date   BNP 27.8 03/25/2018   HGB 14.1 03/25/2018   HCT 42.1 03/25/2018                         CA Markers No results found for: CEA, CA125, LABCA2                      Endocrine Markers Lab Results  Component Value Date   TSH 0.580 01/08/2018                        Note: Lab results reviewed.  Recent Diagnostic Imaging Review   Lumbosacral Imaging: Lumbar MR wo contrast:  Results for orders placed during the hospital encounter of 03/19/18  MR LUMBAR SPINE WO CONTRAST   Narrative CLINICAL DATA:  Chronic low back pain and restless legs, worse for 1 year.  EXAM: MRI LUMBAR SPINE WITHOUT CONTRAST  TECHNIQUE: Multiplanar, multisequence MR imaging of the lumbar spine was performed. No intravenous contrast was administered.  COMPARISON:  Lumbar spine radiographs February 22, 2015.  FINDINGS: SEGMENTATION: For the purposes of this report, the last well-formed intervertebral disc is reported as L5-S1.  ALIGNMENT: Maintained lumbar lordosis. No malalignment.  VERTEBRAE:Vertebral bodies are intact. Intervertebral discs demonstrate normal morphology, mild desiccation L1-2, L2-3 and L5-S1 with multilevel mild chronic discogenic endplate changes. No abnormal or acute bone marrow signal.  CONUS MEDULLARIS AND CAUDA EQUINA: Conus medullaris terminates at T12-L1 and demonstrates normal morphology and signal characteristics. Cauda equina is normal.  PARASPINAL AND OTHER SOFT TISSUES: Included prevertebral and paraspinal soft tissues are normal.  DISC LEVELS:  T12-L1: No disc bulge, canal  stenosis nor neural foraminal narrowing.  L1-2: No disc bulge, canal stenosis nor neural foraminal narrowing. Small central annular fissure.  L2-3: No disc bulge, canal stenosis nor neural foraminal narrowing. Mild facet arthropathy.  L3-4: Small central disc protrusion superimposed 1 x 2  mm disc extrusion with 17 mm inferior migration. Mild facet arthropathy without canal stenosis or neural foraminal narrowing.  L4-5: No disc bulge, canal stenosis nor neural foraminal narrowing. Mild facet arthropathy.  L5-S1: Small central disc protrusion and RIGHT central annular fissure. Minimal facet arthropathy without canal stenosis or neural foraminal narrowing.  IMPRESSION: 1. No acute process. 2. L3-4 disc extrusion.  Multilevel annular fissures. 3. No canal stenosis or neural foraminal narrowing.   Electronically Signed   By: Elon Alas M.D.   On: 03/19/2018 14:13    Lumbar DG 2-3 views:  Results for orders placed during the hospital encounter of 05/10/18  DG Lumbar Spine 2-3 Views   Narrative CLINICAL DATA:  Low back pain  EXAM: LUMBAR SPINE - 2-3 VIEW  COMPARISON:  02/22/2015  FINDINGS: Slight rightward curvature of the lumbar spine. Early degenerative disc disease changes in the lower thoracic and upper lumbar spine. Disc spaces otherwise maintained. No fracture or malalignment. SI joints symmetric and unremarkable.  IMPRESSION: Early degenerative disc disease changes in the lower thoracic and upper lumbar spine. No acute bony abnormality. No change since prior study.   Electronically Signed   By: Rolm Baptise M.D.   On: 05/10/2018 18:58    Lumbar DG (Complete) 4+V:  Results for orders placed during the hospital encounter of 02/22/15  DG Lumbar Spine Complete   Narrative CLINICAL DATA:  Right-sided low back pain for 1 week. No known injury.  EXAM: LUMBAR SPINE - COMPLETE 4+ VIEW  COMPARISON:  02/18/2014  FINDINGS: There are 5 non rib-bearing  lumbar type vertebral bodies.  There is a mild scoliotic curvature (measuring approximately 6 degrees as measured from the superior endplate of Z61 to the inferior endplate of L4), unchanged since the 02/2014 examination. No anterolisthesis or retrolisthesis. No definite pars defects.  Lumbar vertebral body heights are preserved.  There is mild-to-moderate multilevel lumbar spine DDD, worse at L1-L2 and L2-L3 with disc space height loss, endplate irregularity and sclerosis.  Limited visualization the bilateral SI joints is normal.  Regional bowel gas pattern and soft tissues are normal.  IMPRESSION: 1. No acute findings. 2. Mild-to-moderate multilevel lumbar spine DDD, worse at L1-L2 and L2-L3, similar to the 02/2014 examination.   Electronically Signed   By: Sandi Mariscal M.D.   On: 02/23/2015 08:33    Complexity Note: Imaging results reviewed. Results shared with Mr. Weatherall, using Layman's terms.                         Meds   Current Outpatient Medications:  .  albuterol (PROVENTIL HFA;VENTOLIN HFA) 108 (90 Base) MCG/ACT inhaler, Inhale 2 puffs into the lungs every 6 (six) hours as needed for wheezing or shortness of breath., Disp: 1 Inhaler, Rfl: 3 .  ALPRAZolam (XANAX) 0.5 MG tablet, Take 1 tablet (0.5 mg total) by mouth daily as needed for anxiety., Disp: 20 tablet, Rfl: 1 .  aspirin 81 MG tablet, Take 81 mg by mouth daily., Disp: , Rfl:  .  azelastine (ASTELIN) 0.1 % nasal spray, Place 2 sprays into both nostrils 2 (two) times daily., Disp: 30 mL, Rfl: 6 .  azelastine (OPTIVAR) 0.05 % ophthalmic solution, drop affect eye twice a day, Disp: 6 mL, Rfl: 3 .  Blood Glucose Monitoring Suppl (ONE TOUCH ULTRA 2) w/Device KIT, USE AS DIRECTED, Disp: 1 each, Rfl: 0 .  cyclobenzaprine (FLEXERIL) 5 MG tablet, TAKE ONE TABLET BY MOUTH THREE TIMES A DAY AS NEEDED FOR  MUSCLE SPASMS, Disp: 30 tablet, Rfl: 12 .  dexlansoprazole (DEXILANT) 60 MG capsule, Take 1 capsule (60 mg total) by  mouth daily., Disp: 30 capsule, Rfl: 12 .  doxycycline (VIBRA-TABS) 100 MG tablet, Take 1 tablet (100 mg total) by mouth 2 (two) times daily., Disp: 7 tablet, Rfl: 0 .  glipiZIDE (GLUCOTROL XL) 5 MG 24 hr tablet, Take 5 mg by mouth daily with breakfast., Disp: , Rfl:  .  glucose blood (ONE TOUCH ULTRA TEST) test strip, Check blood sugar 3 times daily E11.65 (uncontrolled insulin dependent diabetes), Disp: 100 each, Rfl: 12 .  ketoconazole (NIZORAL) 2 % cream, Apply 1 application topically daily., Disp: 15 g, Rfl: 0 .  metFORMIN (GLUCOPHAGE) 500 MG tablet, Take 1 tablet (500 mg total) by mouth 2 (two) times daily with a meal., Disp: 60 tablet, Rfl: 1 .  mometasone (ELOCON) 0.1 % cream, One application daily as needed for rash, Disp: 45 g, Rfl: 1 .  nitroGLYCERIN (NITROSTAT) 0.4 MG SL tablet, Place 1 tablet (0.4 mg total) under the tongue every 5 (five) minutes as needed for chest pain., Disp: 20 tablet, Rfl: 1 .  oxyCODONE (ROXICODONE) 15 MG immediate release tablet, Take 1 tablet (15 mg total) by mouth every 4 (four) hours as needed for pain., Disp: 120 tablet, Rfl: 0 .  pioglitazone (ACTOS) 15 MG tablet, Take 1 tablet (15 mg total) by mouth daily., Disp: 30 tablet, Rfl: 12 .  Plecanatide (TRULANCE) 3 MG TABS, Take 3 mg by mouth daily., Disp: 30 tablet, Rfl: 5 .  QUEtiapine (SEROQUEL) 25 MG tablet, TAKE 1 TABLET (25 MG TOTAL) BY MOUTH AT BEDTIME. FOR MOOD AND SLEEP (Patient taking differently: Take 12.5-25 mg by mouth at bedtime. For mood and sleep), Disp: 30 tablet, Rfl: 0 .  RABEprazole (ACIPHEX) 20 MG tablet, Take 1 tablet (20 mg total) by mouth daily. TAKE IN PLACE OF DEXLANSOPRAZOLE, Disp: 30 tablet, Rfl: 3 .  risperiDONE (RISPERDAL) 1 MG tablet, Take 1 tablet by mouth daily., Disp: , Rfl: 0 .  rosuvastatin (CRESTOR) 40 MG tablet, TAKE 1 TABLET(40 MG) BY MOUTH DAILY, Disp: 30 tablet, Rfl: 11 .  tiZANidine (ZANAFLEX) 4 MG capsule, Take 1 capsule (4 mg total) by mouth 2 (two) times daily as needed  for muscle spasms., Disp: 60 capsule, Rfl: 0 .  gabapentin (NEURONTIN) 300 MG capsule, Take 1 capsule (300 mg total) by mouth 3 (three) times daily. (Patient not taking: Reported on 05/21/2018), Disp: 90 capsule, Rfl: 2 .  ranitidine (ZANTAC) 150 MG capsule, Take 150 mg by mouth as needed for heartburn., Disp: , Rfl:   ROS  Constitutional: Denies any fever or chills Gastrointestinal: No reported hemesis, hematochezia, vomiting, or acute GI distress Musculoskeletal: Denies any acute onset joint swelling, redness, loss of ROM, or weakness Neurological: No reported episodes of acute onset apraxia, aphasia, dysarthria, agnosia, amnesia, paralysis, loss of coordination, or loss of consciousness  Allergies  Mr. Deshler is allergic to montelukast; depakote [divalproex sodium]; singulair [montelukast sodium]; and atorvastatin.  PFSH  Drug: Mr. Noah  reports no history of drug use. Alcohol:  reports no history of alcohol use. Tobacco:  reports that he quit smoking about 4 years ago. His smoking use included cigarettes. He has a 7.50 pack-year smoking history. He has never used smokeless tobacco. Medical:  has a past medical history of Anxiety, Asthma, Bipolar 2 disorder ([place]), Coronary artery disease, Fatty liver, and History of MI (myocardial infarction). Surgical: Mr. Suder  has a past surgical history that  includes Nasal sinus surgery; Appendectomy; Cardiac catheterization (06/24/2013); Coronary angioplasty; Finger surgery; Coronary artery bypass graft (07/31/2013); Colonoscopy with propofol (N/A, 09/23/2014); and polypectomy (09/23/2014). Family: family history includes Diabetes in his father; Heart attack in his father; Heart disease in his father; Hyperlipidemia in his father and mother.  Constitutional Exam  General appearance: Well nourished, well developed, and well hydrated. In no apparent acute distress Vitals:   05/21/18 1216  BP: 119/79  Pulse: 69  Resp: 16  Temp: 98.3 F (36.8 C)   TempSrc: Oral  SpO2: 99%  Weight: 200 lb (90.7 kg)  Height: '5\' 8"'  (1.727 m)   BMI Assessment: Estimated body mass index is 30.41 kg/m as calculated from the following:   Height as of this encounter: '5\' 8"'  (1.727 m).   Weight as of this encounter: 200 lb (90.7 kg).  BMI interpretation table: BMI level Category Range association with higher incidence of chronic pain  <18 kg/m2 Underweight   18.5-24.9 kg/m2 Ideal body weight   25-29.9 kg/m2 Overweight Increased incidence by 20%  30-34.9 kg/m2 Obese (Class I) Increased incidence by 68%  35-39.9 kg/m2 Severe obesity (Class II) Increased incidence by 136%  >40 kg/m2 Extreme obesity (Class III) Increased incidence by 254%   Patient's current BMI Ideal Body weight  Body mass index is 30.41 kg/m. Ideal body weight: 68.4 kg (150 lb 12.7 oz) Adjusted ideal body weight: 77.3 kg (170 lb 7.6 oz)   BMI Readings from Last 4 Encounters:  05/21/18 30.41 kg/m  04/30/18 31.50 kg/m  04/17/18 31.02 kg/m  03/26/18 30.41 kg/m   Wt Readings from Last 4 Encounters:  05/21/18 200 lb (90.7 kg)  04/30/18 207 lb 3.2 oz (94 kg)  04/17/18 204 lb (92.5 kg)  03/26/18 200 lb (90.7 kg)  Psych/Mental status: Alert, oriented x 3 (person, place, & time)       Eyes: PERLA Respiratory: No evidence of acute respiratory distress  Cervical Spine Area Exam  Skin & Axial Inspection: No masses, redness, edema, swelling, or associated skin lesions Alignment: Symmetrical Functional ROM: Unrestricted ROM      Stability: No instability detected Muscle Tone/Strength: Functionally intact. No obvious neuro-muscular anomalies detected. Sensory (Neurological): Unimpaired Palpation: No palpable anomalies              Upper Extremity (UE) Exam    Side: Right upper extremity  Side: Left upper extremity  Skin & Extremity Inspection: Skin color, temperature, and hair growth are WNL. No peripheral edema or cyanosis. No masses, redness, swelling, asymmetry, or associated  skin lesions. No contractures.  Skin & Extremity Inspection: Skin color, temperature, and hair growth are WNL. No peripheral edema or cyanosis. No masses, redness, swelling, asymmetry, or associated skin lesions. No contractures.  Functional ROM: Unrestricted ROM          Functional ROM: Unrestricted ROM          Muscle Tone/Strength: Functionally intact. No obvious neuro-muscular anomalies detected.  Muscle Tone/Strength: Functionally intact. No obvious neuro-muscular anomalies detected.  Sensory (Neurological): Unimpaired          Sensory (Neurological): Unimpaired          Palpation: No palpable anomalies              Palpation: No palpable anomalies              Provocative Test(s):  Phalen's test: deferred Tinel's test: deferred Apley's scratch test (touch opposite shoulder):  Action 1 (Across chest): deferred Action 2 (Overhead): deferred Action 3 (LB  reach): deferred   Provocative Test(s):  Phalen's test: deferred Tinel's test: deferred Apley's scratch test (touch opposite shoulder):  Action 1 (Across chest): deferred Action 2 (Overhead): deferred Action 3 (LB reach): deferred    Thoracic Spine Area Exam  Skin & Axial Inspection: No masses, redness, or swelling Alignment: Symmetrical Functional ROM: Unrestricted ROM Stability: No instability detected Muscle Tone/Strength: Functionally intact. No obvious neuro-muscular anomalies detected. Sensory (Neurological): Unimpaired Muscle strength & Tone: No palpable anomalies  Lumbar Spine Area Exam  Skin & Axial Inspection: No masses, redness, or swelling Alignment: Symmetrical Functional ROM: Pain restricted ROM       Stability: No instability detected Muscle Tone/Strength: Functionally intact. No obvious neuro-muscular anomalies detected. Sensory (Neurological): Musculoskeletal pain pattern Palpation: No palpable anomalies       Provocative Tests: Hyperextension/rotation test: (+) bilaterally for facet joint pain. Lumbar  quadrant test (Kemp's test): deferred today       Lateral bending test: deferred today       Patrick's Maneuver: deferred today                   FABER* test: deferred today                   S-I anterior distraction/compression test: deferred today         S-I lateral compression test: deferred today         S-I Thigh-thrust test: deferred today         S-I Gaenslen's test: deferred today         *(Flexion, ABduction and External Rotation)  Gait & Posture Assessment  Ambulation: Unassisted Gait: Relatively normal for age and body habitus Posture: WNL   Lower Extremity Exam    Side: Right lower extremity  Side: Left lower extremity  Stability: No instability observed          Stability: No instability observed          Skin & Extremity Inspection: Skin color, temperature, and hair growth are WNL. No peripheral edema or cyanosis. No masses, redness, swelling, asymmetry, or associated skin lesions. No contractures.  Skin & Extremity Inspection: Skin color, temperature, and hair growth are WNL. No peripheral edema or cyanosis. No masses, redness, swelling, asymmetry, or associated skin lesions. No contractures.  Functional ROM: Unrestricted ROM                  Functional ROM: Unrestricted ROM                  Muscle Tone/Strength: Functionally intact. No obvious neuro-muscular anomalies detected.  Muscle Tone/Strength: Functionally intact. No obvious neuro-muscular anomalies detected.  Sensory (Neurological): Unimpaired        Sensory (Neurological): Unimpaired        DTR: Patellar: deferred today Achilles: deferred today Plantar: deferred today  DTR: Patellar: deferred today Achilles: deferred today Plantar: deferred today  Palpation: No palpable anomalies  Palpation: No palpable anomalies   Assessment   Status Diagnosis  Controlled Controlled Controlled 1. Chronic pain syndrome   2. Left hip pain   3. Chronic left SI joint pain   4. Chronic midline low back pain, unspecified  whether sciatica present   5. Hx of CABG (chronic thoracic/chest wall pain)      Lumbar MRI:No evidence of central canal stenosis, neuroforaminal stenosis or disc herniation.  Patient does have mild facet arthropathy at L4-L5, unlikely to be explaining his diffuse axial low back pain symptoms.  Had an extensive  discussion with the patient about chronic opioid therapy.  I informed him that he would not be a candidate for chronic opioid therapy for the following reasons: No significant pathology on lumbar MRI to suggest such high-dose medications.  At this point, the patient's axial low back pain does not seem to be stemming from a lumbar spinal source.  I informed the patient that I am not discounting his pain at all and that his pain could be coming from other pain generators i.e. muscle, fascia, ligaments etc. Furthermore, I have also expressed my concern about his ED admission for SI on 01/08/2018.  At his ED visit, patient stated that he has a very firm commitment to killing himself and was frustrated and angry that they would not prescribe him narcotics.  Patient did take extra Xanax the night before he presented to the ED.  Today- Patient is unclear as to why he is here.  He was unable to get his hip and SI joint x-rays as ordered at the last visit. He does not have a reason for not getting these done other than his pain is better.   At this time, I told the patient that I do not have much to offer him in terms of interventional therapies especially since he was unable to get his SI joint and hip x-rays.  He states that those regions are feeling better than when he saw me at the last visit.  .  Given that the patient's lumbar MRI is largely unremarkable, may be worthwhile for the patient to see neurology and consider EMG/nerve conduction velocity studies to evaluate for any peripheral neuropathy (ordered at last visit)  Continue MM with PCP   Provider-requested follow-up: No follow-ups on  file.  Time Note: Greater than 50% of the 15 minute(s) of face-to-face time spent with Mr. Eickhoff, was spent in counseling/coordination of care regarding: Mr. Magowan primary cause of pain, treatment alternatives and realistic expectations.  Future Appointments  Date Time Provider [place]  06/17/2018 11:00 AM Birdie Sons, MD [facility] None  07/16/2018 10:20 AM Caryn Section Kirstie Peri, MD [facility] None    Primary Care Physician: Birdie Sons, MD Location: [facility] Outpatient Pain Management Facility Note by: Gillis Santa, M.D Date: 05/21/2018; Time: 2:48 PM  There are no Patient Instructions on file for this visit.

## 2018-05-21 NOTE — Progress Notes (Signed)
Safety precautions to be maintained throughout the outpatient stay will include: orient to surroundings, keep bed in low position, maintain call bell within reach at all times, provide assistance with transfer out of bed and ambulation.  

## 2018-05-22 ENCOUNTER — Other Ambulatory Visit: Payer: Self-pay | Admitting: Family Medicine

## 2018-05-22 NOTE — Telephone Encounter (Signed)
Patient was prescribed Trulance last month in place of Linzess for constipation due to insurance formulary. Please see if the Trulance is working or not. If so we can refill it, if not we can do prior auth and try to et Linzess covered again.

## 2018-05-22 NOTE — Telephone Encounter (Signed)
-----   Message from Birdie Sons, MD sent at 05/13/2018  7:38 AM EST ----- Regarding: see if Trulance is effective, if not then complete PA for [employer]

## 2018-05-24 MED ORDER — PLECANATIDE 3 MG PO TABS: 3.0000 mg | ORAL_TABLET | Freq: Every day | ORAL | 5 refills | Status: DC

## 2018-05-24 NOTE — Telephone Encounter (Signed)
Pt states he is doing well with Trulance.  Please send refills for [employer].   Thanks,   -Mickel Baas

## 2018-06-05 DIAGNOSIS — F6381 Intermittent explosive disorder: Secondary | ICD-10-CM | POA: Diagnosis not present

## 2018-06-05 DIAGNOSIS — F411 Generalized anxiety disorder: Secondary | ICD-10-CM | POA: Diagnosis not present

## 2018-06-05 DIAGNOSIS — F3111 Bipolar disorder, current episode manic without psychotic features, mild: Secondary | ICD-10-CM | POA: Diagnosis not present

## 2018-06-06 ENCOUNTER — Other Ambulatory Visit: Payer: Self-pay | Admitting: Family Medicine

## 2018-06-06 DIAGNOSIS — G8929 Other chronic pain: Secondary | ICD-10-CM

## 2018-06-06 DIAGNOSIS — M545 Low back pain, unspecified: Secondary | ICD-10-CM

## 2018-06-06 MED ORDER — OXYCODONE HCL 15 MG PO TABS: 15.0000 mg | ORAL_TABLET | ORAL | 0 refills | Status: DC | PRN

## 2018-06-06 NOTE — Telephone Encounter (Signed)
Pt needing a refill on: oxyCODONE (ROXICODONE) 15 MG immediate release tablet  Please fill at:  [facility], [place] [phone] (Phone) [phone] (Fax)   Thanks, [place]

## 2018-06-07 DIAGNOSIS — M5441 Lumbago with sciatica, right side: Secondary | ICD-10-CM | POA: Diagnosis not present

## 2018-06-07 DIAGNOSIS — E538 Deficiency of other specified B group vitamins: Secondary | ICD-10-CM | POA: Diagnosis not present

## 2018-06-07 DIAGNOSIS — F332 Major depressive disorder, recurrent severe without psychotic features: Secondary | ICD-10-CM | POA: Diagnosis not present

## 2018-06-07 DIAGNOSIS — M25551 Pain in right hip: Secondary | ICD-10-CM | POA: Diagnosis not present

## 2018-06-07 DIAGNOSIS — E559 Vitamin D deficiency, unspecified: Secondary | ICD-10-CM | POA: Diagnosis not present

## 2018-06-17 ENCOUNTER — Ambulatory Visit (INDEPENDENT_AMBULATORY_CARE_PROVIDER_SITE_OTHER): Payer: BLUE CROSS/BLUE SHIELD | Admitting: Family Medicine

## 2018-06-17 ENCOUNTER — Other Ambulatory Visit: Payer: Self-pay

## 2018-06-17 ENCOUNTER — Other Ambulatory Visit: Payer: Self-pay | Admitting: Family Medicine

## 2018-06-17 ENCOUNTER — Encounter: Payer: Self-pay | Admitting: Family Medicine

## 2018-06-17 VITALS — BP 120/78 | Temp 97.7°F | Ht 68.0 in | Wt 198.8 lb

## 2018-06-17 DIAGNOSIS — E1169 Type 2 diabetes mellitus with other specified complication: Secondary | ICD-10-CM | POA: Insufficient documentation

## 2018-06-17 DIAGNOSIS — I2510 Atherosclerotic heart disease of native coronary artery without angina pectoris: Secondary | ICD-10-CM

## 2018-06-17 DIAGNOSIS — E1165 Type 2 diabetes mellitus with hyperglycemia: Secondary | ICD-10-CM

## 2018-06-17 DIAGNOSIS — K5904 Chronic idiopathic constipation: Secondary | ICD-10-CM | POA: Diagnosis not present

## 2018-06-17 LAB — POCT GLYCOSYLATED HEMOGLOBIN (HGB A1C): Hemoglobin A1C: 9.3 % — AB (ref 4.0–5.6)

## 2018-06-17 MED ORDER — SEMAGLUTIDE(0.25 OR 0.5MG/DOS) 2 MG/1.5ML ~~LOC~~ SOPN: 0.2500 mg | PEN_INJECTOR | SUBCUTANEOUS | 0 refills | Status: DC

## 2018-06-17 NOTE — Patient Instructions (Addendum)
.   Start taking OTC powdered metamucil mixed with water every evening to help with constipation. Continue Trulance once a day  If constipation does not improve with addition of metamucil, then call so we can request authorization from your insurance company to get Linzess  . Start samples of Ozempic 0.25mg  once a week for 4 weeks. Then increase to 0.5mg  once a week for 2 weeks.

## 2018-06-17 NOTE — Progress Notes (Signed)
Patient: Vincent Hall Male    DOB: 1965-10-09   53 y.o.   MRN: [id number] Visit Date: 06/17/2018  Today's Provider: [name], MD   Chief Complaint  Patient presents with  . Diabetes    6 wk fup   Subjective:     HPI   Diabetes Mellitus Type II, Follow-up:   Lab Results  Component Value Date   HGBA1C 7.9 (H) 03/25/2018   HGBA1C 8.3 (H) 01/08/2018   HGBA1C 8.3 (A) 11/30/2017    Last seen for diabetes 3 months ago.  Management since then includes stopping  Invokana due to balantitis and starting metformin He reports good compliance with treatment. He is not having side effects.  Current symptoms include none and have been unchanged. Home blood sugar records: fasting range: 140 to 180  Episodes of hypoglycemia? no   Current Insulin Regimen: none Most Recent Eye Exam: 06/06/2016 Weight trend: fluctuating a bit Prior visit with dietician: No Current exercise: none Current diet habits: in general, a "healthy" diet     Pertinent Labs:    Component Value Date/Time   CHOL 132 01/08/2018 1159   CHOL 167 07/26/2017 1138   TRIG 111 01/08/2018 1159   HDL 49 01/08/2018 1159   HDL 45 07/26/2017 1138   LDLCALC 61 01/08/2018 1159   LDLCALC 79 07/26/2017 1138   LDLCALC 124 (H) 01/31/2017 1230   CREATININE 1.06 03/25/2018 1042   CREATININE 0.93 01/31/2017 1230    Wt Readings from Last 3 Encounters:  06/17/18 198 lb 12.8 oz (90.2 kg)  05/21/18 200 lb (90.7 kg)  04/30/18 207 lb 3.2 oz (94 kg)    ------------------------------------------------------------------------  Follow up constipation He previously did well on Linzess which was changed to Trulance due to formulary. Previously tried Amitiza which he states did not help at all. Trulance was initially effective, but states he is starting to have to strain a little to have BM. Is not taking any fiber supplement. No adverse effects from Trulance.   Allergies  Allergen Reactions  . Montelukast  Hives    Singular  . Depakote [Divalproex Sodium] Rash  . Invokana [Canagliflozin]     balanitis   . Singulair [Montelukast Sodium] Hives  . Atorvastatin Other (See Comments)    Muscle pain, neck stiffness     Current Outpatient Medications:  .  albuterol (PROVENTIL HFA;VENTOLIN HFA) 108 (90 Base) MCG/ACT inhaler, Inhale 2 puffs into the lungs every 6 (six) hours as needed for wheezing or shortness of breath., Disp: 1 Inhaler, Rfl: 3 .  ALPRAZolam (XANAX) 0.5 MG tablet, Take 1 tablet (0.5 mg total) by mouth daily as needed for anxiety., Disp: 20 tablet, Rfl: 1 .  aspirin 81 MG tablet, Take 81 mg by mouth daily., Disp: , Rfl:  .  azelastine (ASTELIN) 0.1 % nasal spray, Place 2 sprays into both nostrils 2 (two) times daily., Disp: 30 mL, Rfl: 6 .  azelastine (OPTIVAR) 0.05 % ophthalmic solution, drop affect eye twice a day, Disp: 6 mL, Rfl: 3 .  Blood Glucose Monitoring Suppl (ONE TOUCH ULTRA 2) w/Device KIT, USE AS DIRECTED, Disp: 1 each, Rfl: 0 .  cyclobenzaprine (FLEXERIL) 5 MG tablet, TAKE ONE TABLET BY MOUTH THREE TIMES A DAY AS NEEDED FOR MUSCLE SPASMS, Disp: 30 tablet, Rfl: 12 .  dexlansoprazole (DEXILANT) 60 MG capsule, Take 1 capsule (60 mg total) by mouth daily., Disp: 30 capsule, Rfl: 12 .  glipiZIDE (GLUCOTROL XL) 5 MG 24 hr tablet, Take  5 mg by mouth daily with breakfast., Disp: , Rfl:  .  glucose blood (ONE TOUCH ULTRA TEST) test strip, Check blood sugar 3 times daily E11.65 (uncontrolled insulin dependent diabetes), Disp: 100 each, Rfl: 12 .  ketoconazole (NIZORAL) 2 % cream, Apply 1 application topically daily., Disp: 15 g, Rfl: 0 .  metFORMIN (GLUCOPHAGE) 500 MG tablet, Take 1 tablet (500 mg total) by mouth 2 (two) times daily with a meal., Disp: 60 tablet, Rfl: 1 .  mometasone (ELOCON) 0.1 % cream, One application daily as needed for rash, Disp: 45 g, Rfl: 1 .  nitroGLYCERIN (NITROSTAT) 0.4 MG SL tablet, Place 1 tablet (0.4 mg total) under the tongue every 5 (five) minutes  as needed for chest pain., Disp: 20 tablet, Rfl: 1 .  oxyCODONE (ROXICODONE) 15 MG immediate release tablet, Take 1 tablet (15 mg total) by mouth every 4 (four) hours as needed for pain., Disp: 120 tablet, Rfl: 0 .  pioglitazone (ACTOS) 15 MG tablet, Take 1 tablet (15 mg total) by mouth daily., Disp: 30 tablet, Rfl: 12 .  QUEtiapine (SEROQUEL) 25 MG tablet, TAKE 1 TABLET (25 MG TOTAL) BY MOUTH AT BEDTIME. FOR MOOD AND SLEEP (Patient taking differently: Take 12.5-25 mg by mouth at bedtime. For mood and sleep), Disp: 30 tablet, Rfl: 0 .  RABEprazole (ACIPHEX) 20 MG tablet, Take 1 tablet (20 mg total) by mouth daily. TAKE IN PLACE OF DEXLANSOPRAZOLE, Disp: 30 tablet, Rfl: 3 .  risperiDONE (RISPERDAL) 1 MG tablet, Take 1 tablet by mouth daily., Disp: , Rfl: 0 .  rosuvastatin (CRESTOR) 40 MG tablet, TAKE 1 TABLET(40 MG) BY MOUTH DAILY, Disp: 30 tablet, Rfl: 11 .  tiZANidine (ZANAFLEX) 4 MG capsule, Take 1 capsule (4 mg total) by mouth 2 (two) times daily as needed for muscle spasms., Disp: 60 capsule, Rfl: 0 .  doxycycline (VIBRA-TABS) 100 MG tablet, Take 1 tablet (100 mg total) by mouth 2 (two) times daily. (Patient not taking: Reported on 06/17/2018), Disp: 7 tablet, Rfl: 0 .  gabapentin (NEURONTIN) 300 MG capsule, Take 1 capsule (300 mg total) by mouth 3 (three) times daily. (Patient not taking: Reported on 05/21/2018), Disp: 90 capsule, Rfl: 2 .  Plecanatide (TRULANCE) 3 MG TABS, Take 3 mg by mouth daily., Disp: 30 tablet, Rfl: 5 .  ranitidine (ZANTAC) 150 MG capsule, Take 150 mg by mouth as needed for heartburn., Disp: , Rfl:   Review of Systems  Constitutional: Negative.   HENT: Negative.   Eyes: Negative.   Respiratory: Negative.   Cardiovascular: Negative.   Gastrointestinal: Negative.   Endocrine: Negative.   Genitourinary: Negative.   Musculoskeletal: Negative.   Skin: Negative.   Allergic/Immunologic: Negative.   Neurological: Negative.   Hematological: Negative.     Psychiatric/Behavioral: Negative.     Social History   Tobacco Use  . Smoking status: Former Smoker    Packs/day: 0.25    Years: 30.00    Pack years: 7.50    Types: Cigarettes    Last attempt to quit: 08/08/2013    Years since quitting: 4.8  . Smokeless tobacco: Never Used  Substance Use Topics  . Alcohol use: No      Objective:   BP 120/78 (BP Location: Right Arm, Patient Position: Sitting, Cuff Size: Normal)   Temp 97.7 F (36.5 C) (Oral)   Ht '5\' 8"'  (1.727 m)   Wt 198 lb 12.8 oz (90.2 kg)   BMI 30.23 kg/m  Vitals:   06/17/18 1112  BP: 120/78  Temp:  97.7 F (36.5 C)  TempSrc: Oral  Weight: 198 lb 12.8 oz (90.2 kg)  Height: '5\' 8"'  (1.727 m)    Physical Exam  General appearance: alert, well developed, well nourished, cooperative and in no distress Head: Normocephalic, without obvious abnormality, atraumatic Respiratory: Respirations even and unlabored, normal respiratory rate Extremities: No gross deformities Skin: Skin color, texture, turgor normal. No rashes seen  Psych: Appropriate mood and affect. Neurologic: Mental status: Alert, oriented to person, place, and time, thought content appropriate.   A1c=9.6%    Assessment & Plan    1. Uncontrolled type 2 diabetes mellitus with hyperglycemia ([place]) Off Invokana due to balanitis. Try- Semaglutide,0.25 or 0.5MG/DOS, (OZEMPIC, 0.25 OR 0.5 MG/DOSE,) 2 MG/1.5ML SOPN; Inject 0.25 mg into the skin once a week. once a week for 4 weeks. Then increase to 0.80m once a week for 2 weeks.  Dispense: 1 pen; Refill: 0  Counseled that this may aggravated constipation and to call if any problems. Otherwise return in 6 weeks.   2. Chronic idiopathic constipation Tolerating Trulance recently prescribed, but was previously much better controlled with Linzess. Is to add daily powdered fiber mix, but call for Linzess prior auth if not effective.   3. Coronary artery disease involving native coronary artery of native heart without  angina pectoris Add Ozempic. Asymptomatic. Compliant with medication.  Continue aggressive risk factor modification.       DLelon Huh MD  [facility]

## 2018-06-28 ENCOUNTER — Other Ambulatory Visit: Payer: Self-pay | Admitting: Family Medicine

## 2018-06-28 DIAGNOSIS — G8929 Other chronic pain: Secondary | ICD-10-CM

## 2018-06-28 DIAGNOSIS — M545 Low back pain, unspecified: Secondary | ICD-10-CM

## 2018-06-28 MED ORDER — OXYCODONE HCL 15 MG PO TABS: 15.0000 mg | ORAL_TABLET | ORAL | 0 refills | Status: DC | PRN

## 2018-06-28 NOTE — Telephone Encounter (Signed)
Pt needing a refill on:  oxyCODONE (ROXICODONE) 15 MG immediate release tablet  Please fill at:  [place], [place] [phone] (Phone) [phone] (Fax)    [place], [place]

## 2018-07-05 ENCOUNTER — Other Ambulatory Visit: Payer: Self-pay | Admitting: *Deleted

## 2018-07-05 MED ORDER — NYSTATIN 100000 UNIT/ML MT SUSP: 5.0000 mL | Freq: Four times a day (QID) | OROMUCOSAL | 0 refills | Status: DC

## 2018-07-05 NOTE — Telephone Encounter (Signed)
Patient is requesting a refill for nystatin 5 mL 4 times daily. [employer].

## 2018-07-11 ENCOUNTER — Telehealth: Payer: Self-pay | Admitting: Family Medicine

## 2018-07-11 DIAGNOSIS — G4700 Insomnia, unspecified: Secondary | ICD-10-CM

## 2018-07-11 NOTE — Telephone Encounter (Signed)
Pt needs a refill on his trazodone 100 mg take two tablets by mouth a bedtime.  This was originally was prescribed by his psychiatrist.  He has changed psychiatrist and that appt was cancelled due to the corona virus  CB# [place]

## 2018-07-12 MED ORDER — TRAZODONE HCL 100 MG PO TABS: 200.0000 mg | ORAL_TABLET | Freq: Every day | ORAL | 5 refills | Status: DC

## 2018-07-12 NOTE — Telephone Encounter (Signed)
Is this okay to refill since prescribed by another physican? Please review chart and advise. KW

## 2018-07-16 ENCOUNTER — Ambulatory Visit: Payer: Self-pay | Admitting: Family Medicine

## 2018-07-17 ENCOUNTER — Other Ambulatory Visit: Payer: Self-pay | Admitting: Family Medicine

## 2018-07-17 DIAGNOSIS — M545 Low back pain: Principal | ICD-10-CM

## 2018-07-17 DIAGNOSIS — G8929 Other chronic pain: Secondary | ICD-10-CM

## 2018-07-17 NOTE — Telephone Encounter (Signed)
Pt s refill on his Oxycodone 15 mg  [facility]  CB# [phone]  Thanks Con Memos

## 2018-07-18 MED ORDER — OXYCODONE HCL 15 MG PO TABS: 15.0000 mg | ORAL_TABLET | ORAL | 0 refills | Status: DC | PRN

## 2018-07-29 ENCOUNTER — Ambulatory Visit: Payer: Self-pay | Admitting: Family Medicine

## 2018-07-29 ENCOUNTER — Encounter: Payer: Self-pay | Admitting: Family Medicine

## 2018-07-29 ENCOUNTER — Other Ambulatory Visit: Payer: Self-pay

## 2018-07-29 ENCOUNTER — Ambulatory Visit (INDEPENDENT_AMBULATORY_CARE_PROVIDER_SITE_OTHER): Payer: BLUE CROSS/BLUE SHIELD | Admitting: Family Medicine

## 2018-07-29 VITALS — BP 108/72 | HR 72 | Temp 98.9°F | Resp 16 | Wt 201.0 lb

## 2018-07-29 DIAGNOSIS — R0789 Other chest pain: Secondary | ICD-10-CM

## 2018-07-29 DIAGNOSIS — E1165 Type 2 diabetes mellitus with hyperglycemia: Secondary | ICD-10-CM | POA: Diagnosis not present

## 2018-07-29 DIAGNOSIS — M533 Sacrococcygeal disorders, not elsewhere classified: Secondary | ICD-10-CM

## 2018-07-29 DIAGNOSIS — B370 Candidal stomatitis: Secondary | ICD-10-CM | POA: Diagnosis not present

## 2018-07-29 DIAGNOSIS — G8929 Other chronic pain: Secondary | ICD-10-CM | POA: Diagnosis not present

## 2018-07-29 LAB — POCT GLYCOSYLATED HEMOGLOBIN (HGB A1C)
Est. average glucose Bld gHb Est-mCnc: 174
Hemoglobin A1C: 7.7 % — AB (ref 4.0–5.6)

## 2018-07-29 MED ORDER — NYSTATIN 100000 UNIT/ML MT SUSP: 5.0000 mL | Freq: Four times a day (QID) | OROMUCOSAL | 1 refills | Status: DC

## 2018-07-29 MED ORDER — SEMAGLUTIDE(0.25 OR 0.5MG/DOS) 2 MG/1.5ML ~~LOC~~ SOPN: 0.5000 mg | PEN_INJECTOR | SUBCUTANEOUS | 5 refills | Status: DC

## 2018-07-29 MED ORDER — GLIPIZIDE ER 10 MG PO TB24: ORAL_TABLET | ORAL | 0 refills | Status: DC

## 2018-07-29 NOTE — Progress Notes (Signed)
Patient: Vincent Hall Male    DOB: [birth year]   53 y.o.   MRN: [id number] Visit Date: 07/29/2018  Today's Provider: [name], MD   Chief Complaint  Patient presents with  . Diabetes   Subjective:     HPI  Diabetes Mellitus Type II, Follow-up:   Lab Results  Component Value Date   HGBA1C 9.3 (A) 06/17/2018   HGBA1C 7.9 (H) 03/25/2018   HGBA1C 8.3 (H) 01/08/2018    Last seen for diabetes 6 weeks ago.  Management since then includes starting trial of Ozempic 0.25. Has not yet titrated up to 0.41m He reports good compliance with treatment. He is having side effects; constipation.  Current symptoms include none and have been stable. Home blood sugar records: fasting range: 96-120  Episodes of hypoglycemia? no    Most Recent Eye Exam: 1 year ago Weight trend: stable Prior visit with dietician: No Current exercise: none Current diet habits: well balanced  Pertinent Labs:    Component Value Date/Time   CHOL 132 01/08/2018 1159   CHOL 167 07/26/2017 1138   TRIG 111 01/08/2018 1159   HDL 49 01/08/2018 1159   HDL 45 07/26/2017 1138   LDLCALC 61 01/08/2018 1159   LDLCALC 79 07/26/2017 1138   LDLCALC 124 (H) 01/31/2017 1230   CREATININE 1.06 03/25/2018 1042   CREATININE 0.93 01/31/2017 1230    Wt Readings from Last 3 Encounters:  07/29/18 201 lb (91.2 kg)  06/17/18 198 lb 12.8 oz (90.2 kg)  05/21/18 200 lb (90.7 kg)    ------------------------------------------------------------------------  He also has had mild thrush.  Allergies  Allergen Reactions  . Montelukast Hives    Singular  . Depakote [Divalproex Sodium] Rash  . Invokana [Canagliflozin]     balanitis   . Singulair [Montelukast Sodium] Hives  . Atorvastatin Other (See Comments)    Muscle pain, neck stiffness     Current Outpatient Medications:  .  albuterol (PROVENTIL HFA;VENTOLIN HFA) 108 (90 Base) MCG/ACT inhaler, Inhale 2 puffs into the lungs every 6 (six) hours  as needed for wheezing or shortness of breath., Disp: 1 Inhaler, Rfl: 3 .  ALPRAZolam (XANAX) 0.5 MG tablet, Take 1 tablet (0.5 mg total) by mouth daily as needed for anxiety., Disp: 20 tablet, Rfl: 1 .  aspirin 81 MG tablet, Take 81 mg by mouth daily., Disp: , Rfl:  .  azelastine (ASTELIN) 0.1 % nasal spray, Place 2 sprays into both nostrils 2 (two) times daily., Disp: 30 mL, Rfl: 6 .  azelastine (OPTIVAR) 0.05 % ophthalmic solution, drop affect eye twice a day, Disp: 6 mL, Rfl: 3 .  Blood Glucose Monitoring Suppl (ONE TOUCH ULTRA 2) w/Device KIT, USE AS DIRECTED, Disp: 1 each, Rfl: 0 .  cyclobenzaprine (FLEXERIL) 5 MG tablet, TAKE ONE TABLET BY MOUTH THREE TIMES A DAY AS NEEDED FOR MUSCLE SPASMS, Disp: 30 tablet, Rfl: 12 .  dexlansoprazole (DEXILANT) 60 MG capsule, Take 1 capsule (60 mg total) by mouth daily., Disp: 30 capsule, Rfl: 12 .  glipiZIDE (GLUCOTROL XL) 10 MG 24 hr tablet, Take 1/2 tablet every morning, Disp: 3 tablet, Rfl: 0 .  glucose blood (ONE TOUCH ULTRA TEST) test strip, Check blood sugar 3 times daily E11.65 (uncontrolled insulin dependent diabetes), Disp: 100 each, Rfl: 12 .  ketoconazole (NIZORAL) 2 % cream, Apply 1 application topically daily., Disp: 15 g, Rfl: 0 .  metFORMIN (GLUCOPHAGE) 500 MG tablet, Take 1 tablet (500 mg total) by mouth 2 (two)  times daily with a meal., Disp: 60 tablet, Rfl: 1 .  mometasone (ELOCON) 0.1 % cream, One application daily as needed for rash, Disp: 45 g, Rfl: 1 .  nitroGLYCERIN (NITROSTAT) 0.4 MG SL tablet, Place 1 tablet (0.4 mg total) under the tongue every 5 (five) minutes as needed for chest pain., Disp: 20 tablet, Rfl: 1 .  nystatin (MYCOSTATIN) 100000 UNIT/ML suspension, Take 5 mLs (500,000 Units total) by mouth 4 (four) times daily., Disp: 120 mL, Rfl: 1 .  oxyCODONE (ROXICODONE) 15 MG immediate release tablet, Take 1 tablet (15 mg total) by mouth every 4 (four) hours as needed for pain., Disp: 120 tablet, Rfl: 0 .  pioglitazone (ACTOS) 15  MG tablet, Take 1 tablet (15 mg total) by mouth daily., Disp: 30 tablet, Rfl: 12 .  Plecanatide (TRULANCE) 3 MG TABS, Take 3 mg by mouth daily., Disp: 30 tablet, Rfl: 5 .  QUEtiapine (SEROQUEL) 25 MG tablet, TAKE 1 TABLET (25 MG TOTAL) BY MOUTH AT BEDTIME. FOR MOOD AND SLEEP (Patient taking differently: Take 12.5-25 mg by mouth at bedtime. For mood and sleep), Disp: 30 tablet, Rfl: 0 .  RABEprazole (ACIPHEX) 20 MG tablet, Take 1 tablet (20 mg total) by mouth daily. TAKE IN PLACE OF DEXLANSOPRAZOLE, Disp: 30 tablet, Rfl: 3 .  ranitidine (ZANTAC) 150 MG capsule, Take 150 mg by mouth as needed for heartburn., Disp: , Rfl:  .  risperiDONE (RISPERDAL) 1 MG tablet, Take 1 tablet by mouth daily., Disp: , Rfl: 0 .  rosuvastatin (CRESTOR) 40 MG tablet, TAKE 1 TABLET(40 MG) BY MOUTH DAILY, Disp: 30 tablet, Rfl: 11 .  tiZANidine (ZANAFLEX) 4 MG capsule, Take 1 capsule (4 mg total) by mouth 2 (two) times daily as needed for muscle spasms., Disp: 60 capsule, Rfl: 0 .  traZODone (DESYREL) 100 MG tablet, Take 2 tablets (200 mg total) by mouth at bedtime., Disp: 60 tablet, Rfl: 5 .  gabapentin (NEURONTIN) 300 MG capsule, Take 1 capsule (300 mg total) by mouth 3 (three) times daily. (Patient not taking: Reported on 05/21/2018), Disp: 90 capsule, Rfl: 2 .  Semaglutide,0.25 or 0.5MG/DOS, (OZEMPIC, 0.25 OR 0.5 MG/DOSE,) 2 MG/1.5ML SOPN, Inject 0.5 mg into the skin once a week., Disp: 2 pen, Rfl: 5  Review of Systems  Constitutional: Negative for appetite change, chills and fever.  Respiratory: Negative for chest tightness, shortness of breath and wheezing.   Cardiovascular: Negative for chest pain and palpitations.  Gastrointestinal: Positive for constipation. Negative for abdominal pain, nausea and vomiting.    Social History   Tobacco Use  . Smoking status: Former Smoker    Packs/day: 0.25    Years: 30.00    Pack years: 7.50    Types: Cigarettes    Last attempt to quit: 08/08/2013    Years since quitting: 4.9   . Smokeless tobacco: Never Used  Substance Use Topics  . Alcohol use: No      Objective:   BP 108/72 (BP Location: Right Arm, Patient Position: Sitting, Cuff Size: Large)   Pulse 72   Temp 98.9 F (37.2 C) (Oral)   Resp 16   Wt 201 lb (91.2 kg)   BMI 30.56 kg/m  Vitals:   07/29/18 1343  BP: 108/72  Pulse: 72  Resp: 16  Temp: 98.9 F (37.2 C)  TempSrc: Oral  Weight: 201 lb (91.2 kg)     Physical Exam  General appearance: alert, well developed, well nourished, cooperative and in no distress Head: Normocephalic, without obvious abnormality, atraumatic ENT:  Mild thrush noted . Respiratory: Respirations even and unlabored, normal respiratory rate Extremities: No gross deformities Skin: Skin color, texture, turgor normal. No rashes seen  Psych: Appropriate mood and affect. Neurologic: Mental status: Alert, oriented to person, place, and time, thought content appropriate.  Results for orders placed or performed in visit on 07/29/18  POCT HgB A1C  Result Value Ref Range   Hemoglobin A1C 7.7 (A) 4.0 - 5.6 %   HbA1c POC (<> result, manual entry)     HbA1c, POC (prediabetic range)     HbA1c, POC (controlled diabetic range)     Est. average glucose Bld gHb Est-mCnc 174        Assessment & Plan    1. Uncontrolled type 2 diabetes mellitus with hyperglycemia ([place]) Doing well with addition of semaglutide. Go ahead and titrate up to 0.15m weekly and reduce glipizide to 528mdaily. He's not sure if he still has 1031mlipizide at home. If no he will call back for prescription for 5mg61mblets.  - Semaglutide,0.25 or 0.5MG/DOS, (OZEMPIC, 0.25 OR 0.5 MG/DOSE,) 2 MG/1.5ML SOPN; Inject 0.5 mg into the skin once a week.  Dispense: 2 pen; Refill: 5 - glipiZIDE (GLUCOTROL XL) 10 MG 24 hr tablet; Take 1/2 tablet every morning  Dispense: 3 tablet; Refill: 0 - POCT HgB A1C  2. Thrush  - nystatin (MYCOSTATIN) 100000 UNIT/ML suspension; Take 5 mLs (500,000 Units total) by mouth 4 (four)  times daily.  Dispense: 120 mL; Refill: 1  Future Appointments  Date Time Provider DepaBoscobel20/2020  1:40 PM FishCaryn SectionaKirstie Peri [facility] None   3. Chronic SI pain, chronic post-cabg chest-wall pain Has been a little worse lately and he reports having to take an extra 1/2 tablet oycodone occasionally. Advised not to taking increased dose chronically, but he can call back for early refill this month.     DonaLelon Huh  [facility]

## 2018-07-29 NOTE — Patient Instructions (Addendum)
.   Please bring all of your medications to every appointment so we can make sure that our medication list is the same as yours.    Reduce dose of glipizide to 5mg  daily. If you still have 10mg  samples you can just take 1/2 tablet every morning. If you are running out of 10mg  tablets, then call my office and will send in new prescription for 5mg  tablets.    Increase Ozepmic to 0.5mg  weekly

## 2018-08-05 ENCOUNTER — Other Ambulatory Visit: Payer: Self-pay

## 2018-08-05 DIAGNOSIS — G8929 Other chronic pain: Secondary | ICD-10-CM

## 2018-08-05 DIAGNOSIS — M545 Low back pain: Principal | ICD-10-CM

## 2018-08-05 MED ORDER — OXYCODONE HCL 15 MG PO TABS: 15.0000 mg | ORAL_TABLET | ORAL | 0 refills | Status: DC | PRN

## 2018-08-19 ENCOUNTER — Other Ambulatory Visit: Payer: Self-pay | Admitting: Family Medicine

## 2018-08-23 ENCOUNTER — Other Ambulatory Visit: Payer: Self-pay | Admitting: *Deleted

## 2018-08-23 DIAGNOSIS — G8929 Other chronic pain: Secondary | ICD-10-CM

## 2018-08-23 MED ORDER — OXYCODONE HCL 15 MG PO TABS: 15.0000 mg | ORAL_TABLET | ORAL | 0 refills | Status: DC | PRN

## 2018-08-23 NOTE — Telephone Encounter (Signed)
Please review

## 2018-09-04 ENCOUNTER — Other Ambulatory Visit: Payer: Self-pay | Admitting: Family Medicine

## 2018-09-09 ENCOUNTER — Other Ambulatory Visit: Payer: Self-pay | Admitting: Family Medicine

## 2018-09-09 DIAGNOSIS — M545 Low back pain, unspecified: Secondary | ICD-10-CM

## 2018-09-09 DIAGNOSIS — G8929 Other chronic pain: Secondary | ICD-10-CM

## 2018-09-09 NOTE — Telephone Encounter (Signed)
Pt needs refil on his Oxycodone 15 mg  Vincent Hall  CB#  [phone]  Vincent Hall

## 2018-09-10 MED ORDER — OXYCODONE HCL 15 MG PO TABS: 15.0000 mg | ORAL_TABLET | ORAL | 0 refills | Status: DC | PRN

## 2018-09-19 ENCOUNTER — Other Ambulatory Visit: Payer: Self-pay | Admitting: Family Medicine

## 2018-09-24 ENCOUNTER — Emergency Department
Admission: EM | Admit: 2018-09-24 | Discharge: 2018-09-24 | Disposition: A | Discharge status: Home / Self Care | Payer: Managed Care, Other (non HMO) | Attending: Emergency Medicine | Admitting: Emergency Medicine

## 2018-09-24 ENCOUNTER — Emergency Department: Payer: Managed Care, Other (non HMO)

## 2018-09-24 ENCOUNTER — Other Ambulatory Visit: Payer: Self-pay

## 2018-09-24 ENCOUNTER — Other Ambulatory Visit: Payer: Self-pay | Admitting: Family Medicine

## 2018-09-24 ENCOUNTER — Encounter: Payer: Self-pay | Admitting: Emergency Medicine

## 2018-09-24 DIAGNOSIS — R110 Nausea: Secondary | ICD-10-CM | POA: Diagnosis not present

## 2018-09-24 DIAGNOSIS — J45909 Unspecified asthma, uncomplicated: Secondary | ICD-10-CM | POA: Insufficient documentation

## 2018-09-24 DIAGNOSIS — M545 Low back pain, unspecified: Secondary | ICD-10-CM

## 2018-09-24 DIAGNOSIS — Z87891 Personal history of nicotine dependence: Secondary | ICD-10-CM | POA: Insufficient documentation

## 2018-09-24 DIAGNOSIS — Z79899 Other long term (current) drug therapy: Secondary | ICD-10-CM | POA: Insufficient documentation

## 2018-09-24 DIAGNOSIS — R109 Unspecified abdominal pain: Secondary | ICD-10-CM | POA: Diagnosis present

## 2018-09-24 DIAGNOSIS — K5900 Constipation, unspecified: Secondary | ICD-10-CM | POA: Insufficient documentation

## 2018-09-24 DIAGNOSIS — G8929 Other chronic pain: Secondary | ICD-10-CM

## 2018-09-24 DIAGNOSIS — I252 Old myocardial infarction: Secondary | ICD-10-CM | POA: Insufficient documentation

## 2018-09-24 DIAGNOSIS — K529 Noninfective gastroenteritis and colitis, unspecified: Secondary | ICD-10-CM | POA: Diagnosis not present

## 2018-09-24 DIAGNOSIS — R911 Solitary pulmonary nodule: Secondary | ICD-10-CM | POA: Diagnosis not present

## 2018-09-24 DIAGNOSIS — Z951 Presence of aortocoronary bypass graft: Secondary | ICD-10-CM | POA: Diagnosis not present

## 2018-09-24 DIAGNOSIS — Z955 Presence of coronary angioplasty implant and graft: Secondary | ICD-10-CM | POA: Diagnosis not present

## 2018-09-24 DIAGNOSIS — Z7984 Long term (current) use of oral hypoglycemic drugs: Secondary | ICD-10-CM | POA: Insufficient documentation

## 2018-09-24 LAB — CBC
HCT: 46.4 % (ref 39.0–52.0)
Hemoglobin: 15.1 g/dL (ref 13.0–17.0)
MCH: 26.7 pg (ref 26.0–34.0)
MCHC: 32.5 g/dL (ref 30.0–36.0)
MCV: 82.1 fL (ref 80.0–100.0)
Platelets: 219 10*3/uL (ref 150–400)
RBC: 5.65 MIL/uL (ref 4.22–5.81)
RDW: 12.2 % (ref 11.5–15.5)
WBC: 13.6 10*3/uL — ABNORMAL HIGH (ref 4.0–10.5)
nRBC: 0 % (ref 0.0–0.2)

## 2018-09-24 LAB — COMPREHENSIVE METABOLIC PANEL
ALT: 25 U/L (ref 0–44)
AST: 22 U/L (ref 15–41)
Albumin: 4.7 g/dL (ref 3.5–5.0)
Alkaline Phosphatase: 46 U/L (ref 38–126)
Anion gap: 12 (ref 5–15)
BUN: 13 mg/dL (ref 6–20)
CO2: 32 mmol/L (ref 22–32)
Calcium: 9.1 mg/dL (ref 8.9–10.3)
Chloride: 95 mmol/L — ABNORMAL LOW (ref 98–111)
Creatinine, Ser: 0.84 mg/dL (ref 0.61–1.24)
GFR calc Af Amer: >60 mL/min (ref >60)
GFR calc non Af Amer: >60 mL/min (ref >60)
Glucose, Bld: 166 mg/dL — ABNORMAL HIGH (ref 70–99)
Potassium: 3.8 mmol/L (ref 3.5–5.1)
Sodium: 139 mmol/L (ref 135–145)
Total Bilirubin: 0.4 mg/dL (ref 0.3–1.2)
Total Protein: 7.4 g/dL (ref 6.5–8.1)

## 2018-09-24 LAB — LIPASE, BLOOD: Lipase: 30 U/L (ref 11–51)

## 2018-09-24 MED ORDER — METRONIDAZOLE 500 MG PO TABS: 500.0000 mg | ORAL_TABLET | Freq: Three times a day (TID) | ORAL | 0 refills | Status: DC

## 2018-09-24 MED ORDER — MORPHINE SULFATE (PF) 4 MG/ML IV SOLN
4.0000 mg | Freq: Once | INTRAVENOUS | Status: AC
  Administered 2018-09-24: 4 mg via INTRAVENOUS
  Filled 2018-09-24: qty 1

## 2018-09-24 MED ORDER — CIPROFLOXACIN HCL 500 MG PO TABS
500.0000 mg | ORAL_TABLET | Freq: Once | ORAL | Status: AC
  Administered 2018-09-24: 500 mg via ORAL
  Filled 2018-09-24: qty 1

## 2018-09-24 MED ORDER — MORPHINE SULFATE (PF) 2 MG/ML IV SOLN
2.0000 mg | Freq: Once | INTRAVENOUS | Status: AC
  Administered 2018-09-24: 2 mg via INTRAVENOUS
  Filled 2018-09-24: qty 1

## 2018-09-24 MED ORDER — ONDANSETRON HCL 4 MG/2ML IJ SOLN
INTRAMUSCULAR | Status: AC
  Administered 2018-09-24: 4 mg via INTRAVENOUS
  Filled 2018-09-24: qty 2

## 2018-09-24 MED ORDER — ONDANSETRON HCL 4 MG/2ML IJ SOLN
4.0000 mg | Freq: Once | INTRAMUSCULAR | Status: AC
  Administered 2018-09-24: 4 mg via INTRAVENOUS
  Filled 2018-09-24: qty 2

## 2018-09-24 MED ORDER — ALUM & MAG HYDROXIDE-SIMETH 200-200-20 MG/5ML PO SUSP
30.0000 mL | Freq: Once | ORAL | Status: AC
  Administered 2018-09-24: 30 mL via ORAL
  Filled 2018-09-24: qty 30

## 2018-09-24 MED ORDER — MORPHINE SULFATE (PF) 4 MG/ML IV SOLN
INTRAVENOUS | Status: AC
  Administered 2018-09-24: 4 mg via INTRAVENOUS
  Filled 2018-09-24: qty 1

## 2018-09-24 MED ORDER — SODIUM CHLORIDE 0.9 % IV BOLUS
500.0000 mL | Freq: Once | INTRAVENOUS | Status: AC
  Administered 2018-09-24: 500 mL via INTRAVENOUS

## 2018-09-24 MED ORDER — DICYCLOMINE HCL 10 MG/5ML PO SOLN
10.0000 mg | Freq: Once | ORAL | Status: AC
  Administered 2018-09-24: 10 mg via ORAL
  Filled 2018-09-24 (×3): qty 5

## 2018-09-24 MED ORDER — IOHEXOL 300 MG/ML  SOLN
100.0000 mL | Freq: Once | INTRAMUSCULAR | Status: AC | PRN
  Administered 2018-09-24: 100 mL via INTRAVENOUS

## 2018-09-24 MED ORDER — ONDANSETRON HCL 4 MG/2ML IJ SOLN
4.0000 mg | Freq: Once | INTRAMUSCULAR | Status: AC
  Administered 2018-09-24: 4 mg via INTRAVENOUS

## 2018-09-24 MED ORDER — CIPROFLOXACIN HCL 500 MG PO TABS: 500.0000 mg | ORAL_TABLET | Freq: Two times a day (BID) | ORAL | 0 refills | Status: DC

## 2018-09-24 MED ORDER — METRONIDAZOLE 500 MG PO TABS
500.0000 mg | ORAL_TABLET | Freq: Once | ORAL | Status: AC
  Administered 2018-09-24: 500 mg via ORAL
  Filled 2018-09-24: qty 1

## 2018-09-24 NOTE — ED Triage Notes (Signed)
Pt in via [facility] from home, reports severe abdominal pain and constipation x 3 days.  Pt with BM upon arrival in ED.  Pt states, "My stomach still feels upset but I am not having the same pain I was having."  A/Ox4, ambulatory, vitals WDL.

## 2018-09-24 NOTE — Telephone Encounter (Signed)
Pt daughter called asking about dad Celexa prescription saying Jacky Kindle sent for a PA and they have not received anything back  Please advise  [phone]  Thanks Con Memos

## 2018-09-24 NOTE — Discharge Instructions (Addendum)
Set up close follow-up with Dr. Caryn Section and his team in the next couple of days, and also please it is very important to schedule for close follow-up appointment with her gastroenterologist Dr. Marius Ditch.   Please return to the emergency room right away if you are to develop a fever, severe nausea, your pain becomes severe or worsens, you are unable to keep food down, begin vomiting any dark or bloody fluid, you develop any dark or bloody stools, feel dehydrated, or other new concerns or symptoms arise.

## 2018-09-24 NOTE — ED Triage Notes (Signed)
Pt in via EMS from home with c/o abd pain. 10/10, HR 80, 12 lead negative for elevation. EMS reports pt c/o constipation and released his bowels when he entered the [place].

## 2018-09-24 NOTE — Telephone Encounter (Signed)
[facility] faxed refill request for the following medications:  Citalopram HBR 20 mg tablet  Please advise. Thanks TNP

## 2018-09-24 NOTE — ED Provider Notes (Signed)
° °[facility] °Emergency Department Provider Note ° ° °____________________________________________ ° ° First MD Initiated Contact with Patient 09/24/18 1640   °  (approximate) ° °I have reviewed the triage vital signs and the nursing notes. ° ° °HISTORY ° °Chief Complaint °Abdominal Pain and Constipation ° ° ° °HPI °Vincent Hall is a 52 y.o. male history of bipolar disorder coronary disease, previous MI ° °Patient presents for evaluation of abdominal pain ° °Patient reports he had a constipated feeling for about 10 days.  Called EMS because of the ongoing discomfort and pain along with some nausea but no vomiting.  While here he reports he had a decent sized bowel movement but still having ongoing pain and seemed very painful to have a bowel movement.  No black or bloody stool.  Denies history of ulcers.  Has a history of heart disease but denies any chest pain.  No fevers no cough no chills no exposure to anyone known to have coronavirus ° °No rash.  One bowel movement today.  No vomiting.  Reports he had a colonoscopy thinks about 5 years ago and was told it was normal ° °Past Medical History:  °Diagnosis Date  °• Anxiety   °• Asthma   °• Bipolar 2 disorder (HCC)   °• Coronary artery disease   °• Diabetes (HCC)   °• Fatty liver   °• History of MI (myocardial infarction)   °• STEMI (ST elevation myocardial infarction) (HCC) 06/22/2013  ° ° °Patient Active Problem List  ° Diagnosis Date Noted  °• Uncontrolled type 2 diabetes mellitus with hyperglycemia (HCC) 06/17/2018  °• Fatty liver 03/28/2018  °• Left hip pain 03/26/2018  °• Chronic left SI joint pain 03/26/2018  °• Hx of CABG (chronic thoracic/chest wall pain) 03/26/2018  °• Depression 01/25/2018  °• Suicide attempt (HCC) 01/08/2018  °• Benzodiazepine overdose 01/08/2018  °• Severe recurrent major depression without psychotic features (HCC) 01/08/2018  °• Chronic pain 01/08/2018  °• Thrush, oral 12/03/2017  °• Current moderate  episode of major depressive disorder without prior episode (HCC) 07/26/2017  °• Anxiety 10/28/2014  °• Arthralgia 10/28/2014  °• Chest wall pain 10/28/2014  °• Dysphagia 10/28/2014  °• Fatigue 10/28/2014  °• History of tobacco use 10/28/2014  °• Hyperlipidemia, mixed 10/28/2014  °• Insomnia 10/28/2014  °• Chronic idiopathic constipation 09/18/2014  °• Esophageal reflux 09/18/2014  °• Esophageal spasm 09/18/2014  °• Coronary artery disease 07/07/2013  °• Allergic rhinitis 06/18/2008  ° ° °Past Surgical History:  °Procedure Laterality Date  °• APPENDECTOMY    °• CARDIAC CATHETERIZATION  06/24/2013  °• COLONOSCOPY WITH PROPOFOL N/A 09/23/2014  ° Procedure: COLONOSCOPY WITH PROPOFOL;  Surgeon: Darren Wohl, MD;  Location: [facility];  Service: Endoscopy;  Laterality: N/A;  °• CORONARY ANGIOPLASTY    ° stent placement to LAD  °• CORONARY ARTERY BYPASS GRAFT  07/31/2013  ° x 3 vessels  °• FINGER SURGERY    °• NASAL SINUS SURGERY    °• POLYPECTOMY  09/23/2014  ° Procedure: POLYPECTOMY;  Surgeon: Darren Wohl, MD;  Location: [facility];  Service: Endoscopy;;  ° ° °Prior to Admission medications   °Medication Sig Start Date End Date Taking? Authorizing Provider  °albuterol (PROVENTIL HFA;VENTOLIN HFA) 108 (90 Base) MCG/ACT inhaler Inhale 2 puffs into the lungs every 6 (six) hours as needed for wheezing or shortness of breath. 03/25/18   Fisher, Donald E, MD  °ALPRAZolam (XANAX) 0.5 MG tablet TAKE ONE TABLET BY MOUTH DAILY AS NEEDED FOR ANXIETY   08/20/18   Fisher, Donald E, MD  °aspirin 81 MG tablet Take 81 mg by mouth daily.    [provider]  °azelastine (ASTELIN) 0.1 % nasal spray Place 2 sprays into both nostrils 2 (two) times daily. 07/08/15   Fisher, Donald E, MD  °azelastine (OPTIVAR) 0.05 % ophthalmic solution drop affect eye twice a day 07/08/15   Fisher, Donald E, MD  °Blood Glucose Monitoring Suppl (ONE TOUCH ULTRA 2) w/Device KIT USE AS DIRECTED 05/16/18   Fisher, Donald E, MD  °ciprofloxacin  (CIPRO) 500 MG tablet Take 1 tablet (500 mg total) by mouth 2 (two) times daily. 09/24/18   Baylei Siebels, MD  °cyclobenzaprine (FLEXERIL) 5 MG tablet TAKE ONE TABLET BY MOUTH THREE TIMES A DAY AS NEEDED FOR MUSCLE SPASMS 03/29/18   Fisher, Donald E, MD  °dexlansoprazole (DEXILANT) 60 MG capsule Take 1 capsule (60 mg total) by mouth daily. 05/09/18   Fisher, Donald E, MD  °gabapentin (NEURONTIN) 300 MG capsule Take 1 capsule (300 mg total) by mouth 3 (three) times daily. °Patient not taking: Reported on 05/21/2018 02/26/18 05/27/18  Lateef, Bilal, MD  °glipiZIDE (GLUCOTROL XL) 10 MG 24 hr tablet Take 1/2 tablet every morning 07/29/18   Fisher, Donald E, MD  °glucose blood (ONE TOUCH ULTRA TEST) test strip Check blood sugar 3 times daily E11.65 (uncontrolled insulin dependent diabetes) 04/19/18   Fisher, Donald E, MD  °ketoconazole (NIZORAL) 2 % cream Apply 1 application topically daily. 04/30/18   Fisher, Donald E, MD  °metFORMIN (GLUCOPHAGE) 500 MG tablet Take 1 tablet (500 mg total) by mouth 2 (two) times daily with a meal. 04/30/18   Fisher, Donald E, MD  °metroNIDAZOLE (FLAGYL) 500 MG tablet Take 1 tablet (500 mg total) by mouth 3 (three) times daily. 09/24/18   Lillyahna Hemberger, MD  °mometasone (ELOCON) 0.1 % cream One application daily as needed for rash 11/16/17   Chauvin, Robert, PA  °nitroGLYCERIN (NITROSTAT) 0.4 MG SL tablet Place 1 tablet (0.4 mg total) under the tongue every 5 (five) minutes as needed for chest pain. 05/09/18   Fisher, Donald E, MD  °nystatin (MYCOSTATIN) 100000 UNIT/ML suspension Take 5 mLs (500,000 Units total) by mouth 4 (four) times daily. 07/29/18   Fisher, Donald E, MD  °oxyCODONE (ROXICODONE) 15 MG immediate release tablet Take 1 tablet (15 mg total) by mouth every 4 (four) hours as needed for pain. 09/10/18   Fisher, Donald E, MD  °pioglitazone (ACTOS) 15 MG tablet Take 1 tablet (15 mg total) by mouth daily. 02/22/18   Fisher, Donald E, MD  °Plecanatide (TRULANCE) 3 MG TABS Take 3 mg by mouth daily.  05/24/18   Fisher, Donald E, MD  °QUEtiapine (SEROQUEL) 25 MG tablet TAKE 1 TABLET (25 MG TOTAL) BY MOUTH AT BEDTIME. FOR MOOD AND SLEEP °Patient taking differently: Take 12.5-25 mg by mouth at bedtime. For mood and sleep 02/04/18   Eappen, Saramma, MD  °RABEprazole (ACIPHEX) 20 MG tablet TAKE ONE TABLET BY MOUTH DAILY IN PLACE OF DEXILANT 09/04/18   Fisher, Donald E, MD  °ranitidine (ZANTAC) 150 MG capsule Take 150 mg by mouth as needed for heartburn.    [provider]  °risperiDONE (RISPERDAL) 1 MG tablet Take 1 tablet by mouth daily. 03/14/18   [provider]  °rosuvastatin (CRESTOR) 40 MG tablet TAKE ONE TABLET BY MOUTH DAILY 09/20/18   Fisher, Donald E, MD  °Semaglutide,0.25 or 0.5MG/DOS, (OZEMPIC, 0.25 OR 0.5 MG/DOSE,) 2 MG/1.5ML SOPN Inject 0.5 mg into the skin once   a week. 07/29/18   Fisher, Donald E, MD  °tiZANidine (ZANAFLEX) 4 MG capsule Take 1 capsule (4 mg total) by mouth 2 (two) times daily as needed for muscle spasms. 03/26/18   Lateef, Bilal, MD  °traZODone (DESYREL) 100 MG tablet Take 2 tablets (200 mg total) by mouth at bedtime. 07/12/18   Fisher, Donald E, MD  ° ° °Allergies °Montelukast, Depakote [divalproex sodium], Invokana [canagliflozin], Atorvastatin, and Singulair [montelukast sodium] ° °Family History  °Problem Relation Age of Onset  °• Hyperlipidemia Mother   °• Heart attack Father   °• Hyperlipidemia Father   °• Diabetes Father   °• Heart disease Father   ° ° °Social History °Social History  ° °Tobacco Use  °• Smoking status: Former Smoker  °  Packs/day: 0.25  °  Years: 30.00  °  Pack years: 7.50  °  Types: Cigarettes  °  Quit date: 08/08/2013  °  Years since quitting: 5.1  °• Smokeless tobacco: Never Used  °Substance Use Topics  °• Alcohol use: No  °• Drug use: No  ° ° °Review of Systems °Constitutional: No fever/chills °Eyes: No visual changes. °ENT: No sore throat. °Cardiovascular: Denies chest pain. °Respiratory: Denies shortness of breath. °Gastrointestinal: See HPI    °Pain across the mid abdomen some slightly more in the left.  Was painful to have a bowel movement °genitourinary: Negative for dysuria. °Musculoskeletal: Negative for back pain. °Skin: Negative for rash. °Neurological: Negative for headaches, areas of focal weakness or numbness. ° ° ° °____________________________________________ ° ° °PHYSICAL EXAM: ° °VITAL SIGNS: °ED Triage Vitals  °Enc Vitals Group  °   BP 09/24/18 1441 (!) 168/85  °   Pulse Rate 09/24/18 1441 76  °   Resp 09/24/18 1441 16  °   Temp 09/24/18 1441 (!) 97.4 °F (36.3 °C)  °   Temp Source 09/24/18 1441 Oral  °   SpO2 09/24/18 1441 97 %  °   Weight 09/24/18 1442 200 lb (90.7 kg)  °   Height 09/24/18 1442 5' 8" (1.727 m)  °   Head Circumference --   °   Peak Flow --   °   Pain Score 09/24/18 1442 6  °   Pain Loc --   °   Pain Edu? --   °   Excl. in GC? --   ° ° °Constitutional: Alert and oriented. Well appearing and in no acute distress except he does appear in some pain, having difficulty getting comfortable laying on the bed. °Eyes: Conjunctivae are normal. °Head: Atraumatic. °Nose: No congestion/rhinnorhea. °Mouth/Throat: Mucous membranes are moist. °Neck: No stridor.  °Cardiovascular: Normal rate, regular rhythm. Grossly normal heart sounds.  Good peripheral circulation. °Respiratory: Normal respiratory effort.  No retractions. Lungs CTAB. °Gastrointestinal: Soft and nontender except there is some modest pain epigastrically and in the left lower quadrant without rebound or guarding. No distention. °Musculoskeletal: No lower extremity tenderness nor edema. °Neurologic:  Normal speech and language. No gross focal neurologic deficits are appreciated.  °Skin:  Skin is warm, dry and intact. No rash noted. °Psychiatric: Mood and affect are normal. Speech and behavior are normal. ° °____________________________________________ °  °LABS °(all labs ordered are listed, but only abnormal results are displayed) ° °Labs Reviewed  °COMPREHENSIVE METABOLIC  PANEL - Abnormal; Notable for the following components:  °    Result Value  ° Chloride 95 (*)   ° Glucose, Bld 166 (*)   ° All other components within normal limits  °CBC -   Abnormal; Notable for the following components:   WBC 13.6 (*)    All other components within normal limits  LIPASE, BLOOD  URINALYSIS, COMPLETE (UACMP) WITH MICROSCOPIC   ____________________________________________  EKG  Reviewed entered by me at 1700 Heart rate 85 QRS 100 QTc 460 Normal sinus rhythm, no evidence acute ischemia ____________________________________________  RADIOLOGY  Dg Abdomen 1 View  Result Date: 09/24/2018 CLINICAL DATA:  Abdominal pain, constipation for 3 days EXAM: ABDOMEN - 1 VIEW COMPARISON:  None. FINDINGS: The bowel gas pattern is normal. No radio-opaque calculi or other significant radiographic abnormality are seen. IMPRESSION: Negative. Electronically Signed   By: Kathreen Devoid   On: 09/24/2018 16:23   Ct Abdomen Pelvis W Contrast  Result Date: 09/24/2018 CLINICAL DATA:  Severe abdominal pain and constipation for the past 3 days. EXAM: CT ABDOMEN AND PELVIS WITH CONTRAST TECHNIQUE: Multidetector CT imaging of the abdomen and pelvis was performed using the standard protocol following bolus administration of intravenous contrast. CONTRAST:  156m OMNIPAQUE IOHEXOL 300 MG/ML  SOLN COMPARISON:  Abdominal ultrasound dated March 27, 2018. CT abdomen pelvis dated May 29, 2008. FINDINGS: Lower chest: No acute abnormality. 4 mm nodule in the right middle lobe (series 4, image 6). Hepatobiliary: Unchanged diffuse hepatic steatosis. No focal liver abnormality. The gallbladder is unremarkable. No biliary dilatation. Pancreas: Unremarkable. No pancreatic ductal dilatation or surrounding inflammatory changes. Spleen: Normal in size without focal abnormality. Adrenals/Urinary Tract: Adrenal glands are unremarkable. Subcentimeter low-density lesion in the lower pole of the right kidney is too small to  characterize. No renal calculi or hydronephrosis. Bladder is unremarkable. Stomach/Bowel: The stomach is within normal limits. Mild circumferential wall thickening of the transverse and descending colon as well as the rectum. No obstruction. History of prior appendectomy. Vascular/Lymphatic: Aortic atherosclerosis. No enlarged abdominal or pelvic lymph nodes. Reproductive: Prostate is unremarkable. Other: No abdominal wall hernia or abnormality. No abdominopelvic ascites. No pneumoperitoneum. Musculoskeletal: No acute or significant osseous findings. IMPRESSION: 1. Mild circumferential wall thickening of the transverse and descending colon as well as the rectum, suggestive of proctocolitis. 2. Unchanged diffuse hepatic steatosis. 3. 4 mm pulmonary nodule in the right middle lobe. No follow-up needed if patient is low-risk. Non-contrast chest CT can be considered in 12 months if patient is high-risk. This recommendation follows the consensus statement: Guidelines for Management of Incidental Pulmonary Nodules Detected on CT Images: From the Fleischner Society 2017; Radiology 2017; 284:228-243. 4.  Aortic atherosclerosis (ICD10-I70.0). Electronically Signed   By: WTitus DubinM.D.   On: 09/24/2018 17:39     ____________________________________________   PROCEDURES  Procedure(s) performed: None  Procedures  Critical Care performed: No  ____________________________________________   INITIAL IMPRESSION / ASSESSMENT AND PLAN / ED COURSE  Pertinent labs & imaging results that were available during my care of the patient were reviewed by me and considered in my medical decision making (see chart for details).   Differential diagnosis includes but is not limited to, abdominal perforation, aortic dissection, cholecystitis, appendicitis, diverticulitis, colitis, esophagitis/gastritis, kidney stone, pyelonephritis, urinary tract infection, aortic aneurysm. All are considered in decision and treatment  plan. Based upon the patient's presentation and risk factors, proceed with CT scan given his reported pain.  Ports constipation followed by pain in the rectal area after a bowel movement today.  He is alert, no peritonitis no evidence of an acute abdomen but certainly has tenderness primarily focused in the lower left quadrant.  Suspicion for diverticulitis or other acute inflammatory process is elevated.  Clinical Course as of Sep 23 1840  °Tue Sep 24, 2018  °1809 Patient ambulatory now, appearing improved.  Would like something like a GI cocktail for his stomach.  ° [MQ]  °  °Clinical Course User Index °[MQ] , , MD  ° °----------------------------------------- °6:41 PM on 09/24/2018 °----------------------------------------- ° °Patient feels better, he is ambulatory back and forth the nurses station requesting feel to go home.  He appears much improved, he is alert fully oriented not driving himself.  Will discharge, discussed his case and care with Dr. Vanga of GI who recommends close follow-up and that treatment with Cipro and Flagyl would be indicated given his proctocolitis noted on CT scan.  I comfortable this plan, he appears nontoxic, he is agreeable to return precautions and his pain is controlled now. ° °Return precautions and treatment recommendations and follow-up discussed with the patient who is agreeable with the plan. ° ° °____________________________________________ ° ° °FINAL CLINICAL IMPRESSION(S) / ED DIAGNOSES ° °Final diagnoses:  °Proctocolitis  °Pulmonary nodule  ° ° ° ° ° ° °Note:  This document was prepared using Dragon voice recognition software and may include unintentional dictation errors ° ° ° ° °  °, , MD °09/24/18 1842 ° °

## 2018-09-25 MED ORDER — OXYCODONE HCL 15 MG PO TABS: 15.0000 mg | ORAL_TABLET | ORAL | 0 refills | Status: DC | PRN

## 2018-09-25 NOTE — Telephone Encounter (Signed)
Pt also needs a refill on his oxycodone 15mg   Vincent Hall

## 2018-09-26 ENCOUNTER — Encounter: Payer: Self-pay | Admitting: Emergency Medicine

## 2018-09-26 NOTE — Telephone Encounter (Signed)
Vincent Hall faxed another request for refill on Citalopram HBR 20 mg tablet. Please advise. Thanks TNP

## 2018-09-26 NOTE — Telephone Encounter (Signed)
I don't have any record of him being on citalopram. Was that supposed to go to his psychiatrist?

## 2018-09-27 ENCOUNTER — Telehealth: Payer: Self-pay | Admitting: Family Medicine

## 2018-09-27 ENCOUNTER — Ambulatory Visit: Payer: Managed Care, Other (non HMO) | Admitting: Physician Assistant

## 2018-09-27 ENCOUNTER — Other Ambulatory Visit: Payer: Self-pay

## 2018-09-27 ENCOUNTER — Encounter: Payer: Self-pay | Admitting: Physician Assistant

## 2018-09-27 VITALS — BP 134/75 | HR 66 | Temp 98.3°F | Wt 189.6 lb

## 2018-09-27 DIAGNOSIS — R911 Solitary pulmonary nodule: Secondary | ICD-10-CM | POA: Diagnosis not present

## 2018-09-27 DIAGNOSIS — K6289 Other specified diseases of anus and rectum: Secondary | ICD-10-CM | POA: Diagnosis not present

## 2018-09-27 NOTE — Telephone Encounter (Signed)
Can you check with [facility] and see if they got prescription refill for oxycodone/apap that was sent on 09/25/2018. Its a few days early, but he he has acute colitis and is OK to go ahead and refill now.

## 2018-09-27 NOTE — Telephone Encounter (Signed)
Spoke with pt, his  psychiatrist is going to fill this.    Thanks,   -Mickel Baas

## 2018-09-27 NOTE — Patient Instructions (Signed)
Colitis  Colitis is inflammation of the colon. Colitis may last a short time (be acute), or it may last a long time (become chronic). What are the causes? This condition may be caused by:  Viruses.  Bacteria.  Reaction to medicine.  Certain autoimmune diseases such as Crohn's disease or ulcerative colitis.  Radiation treatment.  Decreased blood flow to the bowel (ischemia). What are the signs or symptoms? Symptoms of this condition include:  Watery diarrhea.  Passing bloody or tarry stool.  Pain.  Fever.  Vomiting.  Tiredness (fatigue).  Weight loss.  Bloating.  Abdominal pain.  Having fewer bowel movements than usual.  A strong and sudden urge to have a bowel movement.  Feeling like the bowel is not empty after a bowel movement. How is this diagnosed? This condition is diagnosed with a stool test or a blood test. You may also have other tests, such as:  X-rays.  CT scan.  Colonoscopy.  Endoscopy.  Biopsy. How is this treated? Treatment for this condition depends on the cause. The condition may be treated by:  Resting the bowel. This involves not eating or drinking for a period of time.  Fluids that are given through an IV.  Medicine for pain and diarrhea.  Antibiotic medicines.  Cortisone medicines.  Surgery. Follow these instructions at home: Eating and drinking   Follow instructions from your health care provider about eating or drinking restrictions.  Drink enough fluid to keep your urine pale yellow.  Work with a dietitian to determine which foods cause your condition to flare up.  Avoid foods that cause flare-ups.  Eat a well-balanced diet. General instructions  If you were prescribed an antibiotic medicine, take it as told by your health care provider. Do not stop taking the antibiotic even if you start to feel better.  Take over-the-counter and prescription medicines only as told by your health care provider.  Keep all  follow-up visits as told by your health care provider. This is important. Contact a health care provider if:  Your symptoms do not go away.  You develop new symptoms. Get help right away if you:  Have a fever that does not go away with treatment.  Develop chills.  Have extreme weakness, fainting, or dehydration.  Have repeated vomiting.  Develop severe pain in your abdomen.  Pass bloody or tarry stool. Summary  Colitis is inflammation of the colon. Colitis may last a short time (be acute), or it may last a long time (become chronic).  Treatment for this condition depends on the cause and may include resting the bowel, taking medicines, or having surgery.  If you were prescribed an antibiotic medicine, take it as told by your health care provider. Do not stop taking the antibiotic even if you start to feel better.  Get help right away if you develop severe pain in your abdomen.  Keep all follow-up visits as told by your health care provider. This is important. This information is not intended to replace advice given to you by your health care provider. Make sure you discuss any questions you have with your health care provider. Document Released: 05/04/2004 Document Revised: 09/27/2017 Document Reviewed: 09/27/2017 Elsevier Interactive Patient Education  2019 [employer]  

## 2018-09-27 NOTE — Telephone Encounter (Signed)
Will place the request sent via fax in provider's box to review and respond to pharmacy. Please advise. Thanks TNP

## 2018-09-27 NOTE — Progress Notes (Signed)
Patient: Vincent Hall Male    DOB: Oct 11, 1965   53 y.o.   MRN: [id number] Visit Date: 09/27/2018  Today's Provider: Trinna Post, PA-C   Chief Complaint  Patient presents with  . Follow-up    ER   Subjective:    I, Porsha McClurkin CMA, am acting as a scribe for [employer].   HPI  Follow up ER visit  Patient was seen in ER for 09/24/2018 on abdominal pain. He called EMS for abdominal pain and was transported to [facility].  He was treated for Proctocolitis.  Treatment for this included: lab work, CT of the abdomen. He reports unsatisfactory compliance with treatment. Patient states he checked his self out. He was discharged on cipro and flagyl. He reports he is still taking this.  He reports this condition is improved. Abdominal pain has improved. He denies fevers. He denies vomiting. Diarrhea has improved. He has a script for oxycodone which he reports he is out of because he took it extra due to increased pain. Requesting refill for this.   CT Abdomen: showed circumferential wall thickening of transverse and descending colon and rectum suggestive of colitis. Unchanged hepatic steatosis. Incidental finding of 4 mm pulmonary nodule in right middle lobe. Reports he smoked for 30 years 1 pack daily for a while and then a pack and a half a day.   ------------------------------------------------------------------------------------    Allergies  Allergen Reactions  . Montelukast Hives    Singular  . Depakote [Divalproex Sodium] Rash  . Invokana [Canagliflozin] Other (See Comments)    Balanitis   . Atorvastatin Other (See Comments)    Muscle pain, neck stiffness  . Singulair [Montelukast Sodium] Hives     Current Outpatient Medications:  .  albuterol (PROVENTIL HFA;VENTOLIN HFA) 108 (90 Base) MCG/ACT inhaler, Inhale 2 puffs into the lungs every 6 (six) hours as needed for wheezing or shortness of breath., Disp: 1 Inhaler, Rfl: 3 .   ALPRAZolam (XANAX) 0.5 MG tablet, TAKE ONE TABLET BY MOUTH DAILY AS NEEDED FOR ANXIETY, Disp: 20 tablet, Rfl: 2 .  aspirin 81 MG tablet, Take 81 mg by mouth daily., Disp: , Rfl:  .  azelastine (ASTELIN) 0.1 % nasal spray, Place 2 sprays into both nostrils 2 (two) times daily., Disp: 30 mL, Rfl: 6 .  azelastine (OPTIVAR) 0.05 % ophthalmic solution, drop affect eye twice a day, Disp: 6 mL, Rfl: 3 .  Blood Glucose Monitoring Suppl (ONE TOUCH ULTRA 2) w/Device KIT, USE AS DIRECTED, Disp: 1 each, Rfl: 0 .  ciprofloxacin (CIPRO) 500 MG tablet, Take 1 tablet (500 mg total) by mouth 2 (two) times daily., Disp: 20 tablet, Rfl: 0 .  cyclobenzaprine (FLEXERIL) 5 MG tablet, TAKE ONE TABLET BY MOUTH THREE TIMES A DAY AS NEEDED FOR MUSCLE SPASMS, Disp: 30 tablet, Rfl: 12 .  dexlansoprazole (DEXILANT) 60 MG capsule, Take 1 capsule (60 mg total) by mouth daily., Disp: 30 capsule, Rfl: 12 .  glipiZIDE (GLUCOTROL XL) 10 MG 24 hr tablet, Take 1/2 tablet every morning, Disp: 3 tablet, Rfl: 0 .  glucose blood (ONE TOUCH ULTRA TEST) test strip, Check blood sugar 3 times daily E11.65 (uncontrolled insulin dependent diabetes), Disp: 100 each, Rfl: 12 .  ketoconazole (NIZORAL) 2 % cream, Apply 1 application topically daily., Disp: 15 g, Rfl: 0 .  metFORMIN (GLUCOPHAGE) 500 MG tablet, Take 1 tablet (500 mg total) by mouth 2 (two) times daily with a meal., Disp: 60 tablet, Rfl: 1 .  metroNIDAZOLE (FLAGYL) 500 MG tablet, Take 1 tablet (500 mg total) by mouth 3 (three) times daily., Disp: 30 tablet, Rfl: 0 .  mometasone (ELOCON) 0.1 % cream, One application daily as needed for rash, Disp: 45 g, Rfl: 1 .  nitroGLYCERIN (NITROSTAT) 0.4 MG SL tablet, Place 1 tablet (0.4 mg total) under the tongue every 5 (five) minutes as needed for chest pain., Disp: 20 tablet, Rfl: 1 .  nystatin (MYCOSTATIN) 100000 UNIT/ML suspension, Take 5 mLs (500,000 Units total) by mouth 4 (four) times daily., Disp: 120 mL, Rfl: 1 .  oxyCODONE (ROXICODONE)  15 MG immediate release tablet, Take 1 tablet (15 mg total) by mouth every 4 (four) hours as needed for pain., Disp: 120 tablet, Rfl: 0 .  pioglitazone (ACTOS) 15 MG tablet, Take 1 tablet (15 mg total) by mouth daily., Disp: 30 tablet, Rfl: 12 .  Plecanatide (TRULANCE) 3 MG TABS, Take 3 mg by mouth daily., Disp: 30 tablet, Rfl: 5 .  QUEtiapine (SEROQUEL) 25 MG tablet, TAKE 1 TABLET (25 MG TOTAL) BY MOUTH AT BEDTIME. FOR MOOD AND SLEEP (Patient taking differently: Take 12.5-25 mg by mouth at bedtime. For mood and sleep), Disp: 30 tablet, Rfl: 0 .  RABEprazole (ACIPHEX) 20 MG tablet, TAKE ONE TABLET BY MOUTH DAILY IN PLACE OF DEXILANT, Disp: 30 tablet, Rfl: 12 .  ranitidine (ZANTAC) 150 MG capsule, Take 150 mg by mouth as needed for heartburn., Disp: , Rfl:  .  risperiDONE (RISPERDAL) 1 MG tablet, Take 1 tablet by mouth daily., Disp: , Rfl: 0 .  rosuvastatin (CRESTOR) 40 MG tablet, TAKE ONE TABLET BY MOUTH DAILY, Disp: 30 tablet, Rfl: 12 .  Semaglutide,0.25 or 0.5MG/DOS, (OZEMPIC, 0.25 OR 0.5 MG/DOSE,) 2 MG/1.5ML SOPN, Inject 0.5 mg into the skin once a week., Disp: 2 pen, Rfl: 5 .  tiZANidine (ZANAFLEX) 4 MG capsule, Take 1 capsule (4 mg total) by mouth 2 (two) times daily as needed for muscle spasms., Disp: 60 capsule, Rfl: 0 .  traZODone (DESYREL) 100 MG tablet, Take 2 tablets (200 mg total) by mouth at bedtime., Disp: 60 tablet, Rfl: 5 .  gabapentin (NEURONTIN) 300 MG capsule, Take 1 capsule (300 mg total) by mouth 3 (three) times daily. (Patient not taking: Reported on 05/21/2018), Disp: 90 capsule, Rfl: 2  Review of Systems  Constitutional: Negative.   Respiratory: Negative.   Genitourinary: Negative.   Neurological: Negative.     Social History   Tobacco Use  . Smoking status: Former Smoker    Packs/day: 0.25    Years: 30.00    Pack years: 7.50    Types: Cigarettes    Quit date: 08/08/2013    Years since quitting: 5.1  . Smokeless tobacco: Never Used  Substance Use Topics  . Alcohol  use: No      Objective:   BP 134/75 (BP Location: Left Arm, Patient Position: Sitting, Cuff Size: Normal)   Pulse 66   Temp 98.3 F (36.8 C) (Oral)   Wt 189 lb 9.6 oz (86 kg)   SpO2 98%   BMI 28.83 kg/m  Vitals:   09/27/18 1528  BP: 134/75  Pulse: 66  Temp: 98.3 F (36.8 C)  TempSrc: Oral  SpO2: 98%  Weight: 189 lb 9.6 oz (86 kg)     Physical Exam Constitutional:      Appearance: Normal appearance.  Cardiovascular:     Rate and Rhythm: Normal rate and regular rhythm.     Heart sounds: Normal heart sounds.  Pulmonary:  Breath sounds: Normal breath sounds.  Abdominal:     General: Abdomen is flat. Bowel sounds are normal. There is no distension.     Palpations: Abdomen is soft.     Tenderness: There is no abdominal tenderness. There is no guarding or rebound.  Skin:    General: Skin is warm and dry.  Neurological:     Mental Status: He is alert and oriented to person, place, and time. Mental status is at baseline.  Psychiatric:        Mood and Affect: Mood is depressed.        Behavior: Behavior normal.         Assessment & Plan    1. Proctitis  Resolving. He is taking his medications still and abdominal pain has improved. He is requesting pain medication refill, which I have contacted his PCP to refill. Counseled to continue antibiotics and follow up with GI.   - Ambulatory referral to Gastroenterology  2. Pulmonary nodule  Incidental finding of pulmonary nodule. He has a long smoking history and should consider chest CT scan in one year to monitor this.   The entirety of the information documented in the History of Present Illness, Review of Systems and Physical Exam were personally obtained by me. Portions of this information were initially documented by Lynford Humphrey, CMA and reviewed by me for thoroughness and accuracy.        Trinna Post, PA-C  [facility]

## 2018-09-27 NOTE — Telephone Encounter (Signed)
Advised pharmacy as below. They recommend that if the patient can fill early to please put in the pharmacy notes.

## 2018-10-01 ENCOUNTER — Encounter: Payer: Self-pay | Admitting: *Deleted

## 2018-10-02 ENCOUNTER — Encounter: Payer: Self-pay | Admitting: Family Medicine

## 2018-10-03 DIAGNOSIS — R911 Solitary pulmonary nodule: Secondary | ICD-10-CM | POA: Insufficient documentation

## 2018-10-10 ENCOUNTER — Telehealth: Payer: Self-pay | Admitting: Pulmonary Disease

## 2018-10-10 NOTE — Telephone Encounter (Signed)

## 2018-10-14 ENCOUNTER — Other Ambulatory Visit: Payer: Self-pay

## 2018-10-14 ENCOUNTER — Ambulatory Visit: Payer: Managed Care, Other (non HMO) | Admitting: Pulmonary Disease

## 2018-10-14 ENCOUNTER — Encounter: Payer: Self-pay | Admitting: Pulmonary Disease

## 2018-10-14 VITALS — BP 110/70 | HR 70 | Temp 97.7°F | Ht 68.0 in | Wt 191.6 lb

## 2018-10-14 DIAGNOSIS — R911 Solitary pulmonary nodule: Secondary | ICD-10-CM | POA: Diagnosis not present

## 2018-10-14 DIAGNOSIS — Z87891 Personal history of nicotine dependence: Secondary | ICD-10-CM

## 2018-10-14 NOTE — Patient Instructions (Signed)
We have referred you to the lung cancer screening. They will contact you to set up CT.  Follow-up as needed.

## 2018-10-14 NOTE — Progress Notes (Signed)
Subjective:    Patient ID: Vincent Hall, male    DOB: [birth year], 53 y.o.   MRN: [id number]  HPI Patient is 53 year old [place] gentleman, former smoker, who presents here for evaluation of a lung nodule.  The referral was made through the ED after CT abdomen and pelvis was performed for evaluation of abdominal pain and constipation.  This was on 16 June.  The patient is asymptomatic with regards to the finding of the lung nodule.  I have reviewed the chest CT in question and of course the only available views are abdominal and pelvic views there is no dedicated CT scan of the chest.  The patient has a minute 3.7 mm nodule in the right middle lobe.  We are asked to render opinion on this incidental finding.  The patient has had no fevers, chills or sweats.  No cough or sputum production.  No chest pain.  He has had no hemoptysis.  No orthopnea, paroxysmal nocturnal dyspnea, lower extremity edema or fatigue.  He has had weight loss over the last 2 years that has been intentional.  He has a history of fatty liver and he is mindful of his diet.   Past medical history, Degele history, family history and social history have been reviewed.  The patient did smoke 1-1/2 packs of cigarettes per day for approximately 30 years.  He discontinued this habit in 2015 when he required CABG for coronary artery disease.  He is currently on disability for chronic back pain.  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   Review of Systems  Constitutional: Negative.   Eyes: Negative.   Respiratory: Negative.   Cardiovascular: Negative.   Gastrointestinal: Negative.   Endocrine: Negative.   Musculoskeletal: Positive for back pain (Chronic).  Skin: Negative.   Allergic/Immunologic: Negative.   Neurological: Negative.   Hematological: Negative.   Psychiatric/Behavioral: Negative.   All other systems reviewed  and are negative.      Objective:   Physical Exam Vitals signs and nursing note reviewed.  Constitutional:      Appearance: Normal appearance. He is overweight.  HENT:     Head: Normocephalic and atraumatic.     Nose:     Comments: Nose/mouth/throat not examined due to masking requirements for COVID-19 Eyes:     General: No scleral icterus.    Conjunctiva/sclera: Conjunctivae normal.     Pupils: Pupils are equal, round, and reactive to light.  Neck:     Musculoskeletal: Normal range of motion and neck supple.  Cardiovascular:     Rate and Rhythm: Normal rate and regular rhythm.     Pulses: Normal pulses.     Heart sounds: Normal heart sounds. No murmur.  Pulmonary:     Effort: Pulmonary effort is normal.     Breath sounds: Normal breath sounds.  Abdominal:     General: There is no distension.  Musculoskeletal:     Right lower leg: No edema.     Left lower leg: No edema.  Skin:    General: Skin is warm and dry.  Neurological:     General: No focal deficit present.     Mental Status: He is alert and oriented to person, place, and time.  Psychiatric:        Mood and Affect: Mood normal.        Behavior: Behavior normal.   CT scan of  the abdomen and pelvis was independently reviewed.  The lung windows which are only the basal windows show a 3.7 mm nodule in the right middle lobe.  As noted this is not a dedicated chest CT.  The images were also reviewed with the patient.      Assessment & Plan:   1.  Pulmonary nodule: This is noted on a abdomen and pelvis CT.  Only visible in the lower lung windows.  Recommendations would be to enroll the patient in the lung cancer screening program.  He meets criteria as he has a 45-pack-year history of smoking and quit 5 years ago.  This would be a good first step to get a dedicated chest CT in this asymptomatic patient with risk factors for lung cancer.  Recommend ongoing follow-up with his primary care physician.  We will see him in  follow-up here on an as-needed basis, glad to see again should his low-dose screening CT findings deemfurther work-up necessary.  2.  Tobacco abuse history in the form of cigarettes: As noted above the patient has a 45-pack-year history of smoking quit in 2015.   Thank you for allowing  pulmonary to participate in this patient's care.    This chart was dictated using voice recognition software/Dragon.  Despite best efforts to proofread, errors can occur which can change the meaning.  Any change was purely unintentional.

## 2018-10-15 ENCOUNTER — Encounter: Payer: Self-pay | Admitting: Pulmonary Disease

## 2018-10-15 ENCOUNTER — Telehealth: Payer: Self-pay | Admitting: *Deleted

## 2018-10-15 NOTE — Telephone Encounter (Signed)
Received referral for lung screening. Patient contacted and informed that he will be scheduled after his next birthday when he will be 53 years old and at that time will be eligible for lung screening. Verbalized understanding.

## 2018-10-17 ENCOUNTER — Other Ambulatory Visit: Payer: Self-pay

## 2018-10-17 DIAGNOSIS — G8929 Other chronic pain: Secondary | ICD-10-CM

## 2018-10-17 MED ORDER — OXYCODONE HCL 15 MG PO TABS: 15.0000 mg | ORAL_TABLET | ORAL | 0 refills | Status: DC | PRN

## 2018-10-17 NOTE — Telephone Encounter (Signed)
Patient request refill

## 2018-10-22 ENCOUNTER — Other Ambulatory Visit: Payer: Self-pay

## 2018-10-22 ENCOUNTER — Ambulatory Visit (INDEPENDENT_AMBULATORY_CARE_PROVIDER_SITE_OTHER): Payer: Managed Care, Other (non HMO) | Admitting: Gastroenterology

## 2018-10-22 ENCOUNTER — Encounter: Payer: Self-pay | Admitting: Gastroenterology

## 2018-10-22 VITALS — BP 119/80 | HR 73 | Ht 68.0 in | Wt 189.0 lb

## 2018-10-22 DIAGNOSIS — Z86010 Personal history of colonic polyps: Secondary | ICD-10-CM

## 2018-10-22 DIAGNOSIS — K219 Gastro-esophageal reflux disease without esophagitis: Secondary | ICD-10-CM

## 2018-10-22 DIAGNOSIS — K5900 Constipation, unspecified: Secondary | ICD-10-CM

## 2018-10-22 MED ORDER — NA SULFATE-K SULFATE-MG SULF 17.5-3.13-1.6 GM/177ML PO SOLN: 1.0000 | Freq: Once | ORAL | 0 refills | Status: AC

## 2018-10-23 NOTE — Progress Notes (Signed)
Vonda Antigua [place]  [place], [place] [address]  Main: [phone]  Fax: [phone]   Gastroenterology Consultation  Referring Provider:     Paulene Floor Primary Care Physician:  Birdie Sons, MD Reason for Consultation:     Proctocolitis        HPI:    Chief Complaint  Patient presents with  . Follow-up    ER Follow Up, Discuss Alternative to [place] ([facility])    Vincent Hall is a 53 y.o. y/o male referred for consultation & management  by Dr. Caryn Section, Kirstie Peri, MD.  Patient with history of bipolar disorder, recently went to the ER due to constipation and abdominal pain.  CT at the ER showed mild circumferential wall thickening of the transverse and descending colon as well as the rectum.  Patient was discharged with outpatient antibiotics.  In the ER patient had one bowel movement and felt better and chose to go home.  Patient denies any recent diarrhea, or any diarrhea is ER visit.  He states he is usually constipated and goes days without a bowel movement. No blood in stool.  No nausea or vomiting.  Reports daily heartburn despite taking Dexilant for years.  However, due to the cost he would like to change the medication to an alternative.  Also is reporting intermittent dysphagia 1-2 times times a week.  Last colonoscopy June 2016 with 4 subcentimeter polyps removed with 1 showing tubular adenoma.  No family history of colon cancer.  Past Medical History:  Diagnosis Date  . Anxiety   . Asthma   . Bipolar 2 disorder ([facility])   . Coronary artery disease   . Diabetes ([facility])   . Fatty liver   . History of MI (myocardial infarction)   . STEMI (ST elevation myocardial infarction) ([facility]) 06/22/2013    Past Surgical History:  Procedure Laterality Date  . APPENDECTOMY    . CARDIAC CATHETERIZATION  06/24/2013  . COLONOSCOPY WITH PROPOFOL N/A 09/23/2014   Procedure: COLONOSCOPY WITH PROPOFOL;  Surgeon: Lucilla Lame, MD;   Location: [facility];  Service: Endoscopy;  Laterality: N/A;  . CORONARY ANGIOPLASTY     stent placement to LAD  . CORONARY ARTERY BYPASS GRAFT  07/31/2013   x 3 vessels  . FINGER SURGERY    . NASAL SINUS SURGERY    . POLYPECTOMY  09/23/2014   Procedure: POLYPECTOMY;  Surgeon: Lucilla Lame, MD;  Location: [facility];  Service: Endoscopy;;    Prior to Admission medications   Medication Sig Start Date End Date Taking? Authorizing Provider  albuterol (PROVENTIL HFA;VENTOLIN HFA) 108 (90 Base) MCG/ACT inhaler Inhale 2 puffs into the lungs every 6 (six) hours as needed for wheezing or shortness of breath. 03/25/18  Yes Birdie Sons, MD  ALPRAZolam Duanne Moron) 0.5 MG tablet TAKE ONE TABLET BY MOUTH DAILY AS NEEDED FOR ANXIETY 08/20/18  Yes Birdie Sons, MD  aspirin 81 MG tablet Take 81 mg by mouth daily.   Yes [provider]  azelastine (ASTELIN) 0.1 % nasal spray Place 2 sprays into both nostrils 2 (two) times daily. 07/08/15  Yes Birdie Sons, MD  azelastine (OPTIVAR) 0.05 % ophthalmic solution drop affect eye twice a day 07/08/15  Yes Birdie Sons, MD  Blood Glucose Monitoring Suppl (ONE TOUCH ULTRA 2) w/Device KIT USE AS DIRECTED 05/16/18  Yes Birdie Sons, MD  citalopram (CELEXA) 20 MG tablet Take 20 mg by mouth daily. Prescribed  by his psychiatrist   Yes [provider]  cyclobenzaprine (FLEXERIL) 5 MG tablet TAKE ONE TABLET BY MOUTH THREE TIMES A DAY AS NEEDED FOR MUSCLE SPASMS 03/29/18  Yes Birdie Sons, MD  dexlansoprazole (DEXILANT) 60 MG capsule Take 1 capsule (60 mg total) by mouth daily. 05/09/18  Yes Birdie Sons, MD  glipiZIDE (GLUCOTROL XL) 10 MG 24 hr tablet Take 1/2 tablet every morning 07/29/18  Yes Birdie Sons, MD  glucose blood (ONE TOUCH ULTRA TEST) test strip Check blood sugar 3 times daily E11.65 (uncontrolled insulin dependent diabetes) 04/19/18  Yes Birdie Sons, MD  ketoconazole (NIZORAL) 2 % cream Apply 1  application topically daily. 04/30/18  Yes Birdie Sons, MD  metFORMIN (GLUCOPHAGE) 500 MG tablet Take 1 tablet (500 mg total) by mouth 2 (two) times daily with a meal. 04/30/18  Yes Birdie Sons, MD  mometasone (ELOCON) 0.1 % cream One application daily as needed for rash 11/16/17  Yes Carmon Ginsberg, PA  nitroGLYCERIN (NITROSTAT) 0.4 MG SL tablet Place 1 tablet (0.4 mg total) under the tongue every 5 (five) minutes as needed for chest pain. 05/09/18  Yes Birdie Sons, MD  oxyCODONE (ROXICODONE) 15 MG immediate release tablet Take 1 tablet (15 mg total) by mouth every 4 (four) hours as needed for pain. 10/17/18  Yes Birdie Sons, MD  pioglitazone (ACTOS) 15 MG tablet Take 1 tablet (15 mg total) by mouth daily. 02/22/18  Yes Birdie Sons, MD  Plecanatide (TRULANCE) 3 MG TABS Take 3 mg by mouth daily. 05/24/18  Yes Birdie Sons, MD  QUEtiapine (SEROQUEL) 25 MG tablet TAKE 1 TABLET (25 MG TOTAL) BY MOUTH AT BEDTIME. FOR MOOD AND SLEEP Patient taking differently: Take 12.5-25 mg by mouth at bedtime. For mood and sleep 02/04/18  Yes Eappen, Ria Clock, MD  RABEprazole (ACIPHEX) 20 MG tablet TAKE ONE TABLET BY MOUTH DAILY IN PLACE OF DEXILANT 09/04/18  Yes Birdie Sons, MD  risperiDONE (RISPERDAL) 1 MG tablet Take 1 tablet by mouth daily. 03/14/18  Yes [provider]  rosuvastatin (CRESTOR) 40 MG tablet TAKE ONE TABLET BY MOUTH DAILY 09/20/18  Yes Birdie Sons, MD  Semaglutide,0.25 or 0.5MG/DOS, (OZEMPIC, 0.25 OR 0.5 MG/DOSE,) 2 MG/1.5ML SOPN Inject 0.5 mg into the skin once a week. 07/29/18  Yes Birdie Sons, MD  tiZANidine (ZANAFLEX) 4 MG capsule Take 1 capsule (4 mg total) by mouth 2 (two) times daily as needed for muscle spasms. 03/26/18  Yes Gillis Santa, MD  traZODone (DESYREL) 100 MG tablet Take 2 tablets (200 mg total) by mouth at bedtime. 07/12/18  Yes Birdie Sons, MD  gabapentin (NEURONTIN) 300 MG capsule Take 1 capsule (300 mg total) by mouth 3 (three) times  daily. Patient not taking: Reported on 05/21/2018 02/26/18 05/27/18  Gillis Santa, MD    Family History  Problem Relation Age of Onset  . Hyperlipidemia Mother   . Heart attack Father   . Hyperlipidemia Father   . Diabetes Father   . Heart disease Father      Social History   Tobacco Use  . Smoking status: Former Smoker    Packs/day: 1.50    Years: 30.00    Pack years: 45.00    Types: Cigarettes    Quit date: 08/08/2013    Years since quitting: 5.2  . Smokeless tobacco: Never Used  Substance Use Topics  . Alcohol use: No  . Drug use: No    Allergies as  of 10/22/2018 - Review Complete 10/22/2018  Allergen Reaction Noted  . Montelukast Hives 06/03/2015  . Depakote [divalproex sodium] Rash 01/11/2018  . Invokana [canagliflozin] Other (See Comments) 06/17/2018  . Atorvastatin Other (See Comments) 08/01/2013  . Singulair [montelukast sodium] Hives 09/18/2014    Review of Systems:    All systems reviewed and negative except where noted in HPI.   Physical Exam:  BP 119/80   Pulse 73   Ht '5\' 8"'  (1.727 m)   Wt 189 lb (85.7 kg)   BMI 28.74 kg/m  No LMP for male patient. Psych:  Alert and cooperative. Normal mood and affect. General:   Alert,  Well-developed, well-nourished, pleasant and cooperative in NAD Head:  Normocephalic and atraumatic. Eyes:  Sclera clear, no icterus.   Conjunctiva pink. Ears:  Normal auditory acuity. Nose:  No deformity, discharge, or lesions. Mouth:  No deformity or lesions,oropharynx pink & moist. Neck:  Supple; no masses or thyromegaly. Abdomen:  Normal bowel sounds.  No bruits.  Soft, non-tender and non-distended without masses, hepatosplenomegaly or hernias noted.  No guarding or rebound tenderness.    Msk:  Symmetrical without gross deformities. Good, equal movement & strength bilaterally. Pulses:  Normal pulses noted. Extremities:  No clubbing or edema.  No cyanosis. Neurologic:  Alert and oriented x3;  grossly normal neurologically.  Skin:  Intact without significant lesions or rashes. No jaundice. Lymph Nodes:  No significant cervical adenopathy. Psych:  Alert and cooperative. Normal mood and affect.   Labs: CBC    Component Value Date/Time   WBC 13.6 (H) 09/24/2018 1451   RBC 5.65 09/24/2018 1451   HGB 15.1 09/24/2018 1451   HGB 14.1 03/25/2018 1042   HCT 46.4 09/24/2018 1451   HCT 42.1 03/25/2018 1042   PLT 219 09/24/2018 1451   PLT 216 03/25/2018 1042   MCV 82.1 09/24/2018 1451   MCV 81 03/25/2018 1042   MCH 26.7 09/24/2018 1451   MCHC 32.5 09/24/2018 1451   RDW 12.2 09/24/2018 1451   RDW 12.7 03/25/2018 1042   LYMPHSABS 3.4 (H) 07/05/2015 1150   EOSABS 0.2 07/05/2015 1150   BASOSABS 0.0 07/05/2015 1150   CMP     Component Value Date/Time   NA 139 09/24/2018 1451   NA 137 03/25/2018 1042   K 3.8 09/24/2018 1451   CL 95 (L) 09/24/2018 1451   CO2 32 09/24/2018 1451   GLUCOSE 166 (H) 09/24/2018 1451   BUN 13 09/24/2018 1451   BUN 10 03/25/2018 1042   CREATININE 0.84 09/24/2018 1451   CREATININE 0.93 01/31/2017 1230   CALCIUM 9.1 09/24/2018 1451   PROT 7.4 09/24/2018 1451   PROT 6.4 03/25/2018 1042   PROT 7.2 02/06/2014 1405   ALBUMIN 4.7 09/24/2018 1451   ALBUMIN 4.4 03/25/2018 1042   ALBUMIN 4.1 02/06/2014 1405   AST 22 09/24/2018 1451   AST 19 02/06/2014 1405   ALT 25 09/24/2018 1451   ALT 42 02/06/2014 1405   ALKPHOS 46 09/24/2018 1451   ALKPHOS 68 02/06/2014 1405   BILITOT 0.4 09/24/2018 1451   BILITOT 0.2 03/25/2018 1042   BILITOT 0.4 02/06/2014 1405   GFRNONAA >60 09/24/2018 1451   GFRNONAA 94 01/31/2017 1230   GFRAA >60 09/24/2018 1451   GFRAA 109 01/31/2017 1230    Imaging Studies: Dg Abdomen 1 View  Result Date: 09/24/2018 CLINICAL DATA:  Abdominal pain, constipation for 3 days EXAM: ABDOMEN - 1 VIEW COMPARISON:  None. FINDINGS: The bowel gas pattern is normal. No radio-opaque calculi  or other significant radiographic abnormality are seen. IMPRESSION: Negative.  Electronically Signed   By: Kathreen Devoid   On: 09/24/2018 16:23   Ct Abdomen Pelvis W Contrast  Result Date: 09/24/2018 CLINICAL DATA:  Severe abdominal pain and constipation for the past 3 days. EXAM: CT ABDOMEN AND PELVIS WITH CONTRAST TECHNIQUE: Multidetector CT imaging of the abdomen and pelvis was performed using the standard protocol following bolus administration of intravenous contrast. CONTRAST:  178m OMNIPAQUE IOHEXOL 300 MG/ML  SOLN COMPARISON:  Abdominal ultrasound dated March 27, 2018. CT abdomen pelvis dated May 29, 2008. FINDINGS: Lower chest: No acute abnormality. 4 mm nodule in the right middle lobe (series 4, image 6). Hepatobiliary: Unchanged diffuse hepatic steatosis. No focal liver abnormality. The gallbladder is unremarkable. No biliary dilatation. Pancreas: Unremarkable. No pancreatic ductal dilatation or surrounding inflammatory changes. Spleen: Normal in size without focal abnormality. Adrenals/Urinary Tract: Adrenal glands are unremarkable. Subcentimeter low-density lesion in the lower pole of the right kidney is too small to characterize. No renal calculi or hydronephrosis. Bladder is unremarkable. Stomach/Bowel: The stomach is within normal limits. Mild circumferential wall thickening of the transverse and descending colon as well as the rectum. No obstruction. History of prior appendectomy. Vascular/Lymphatic: Aortic atherosclerosis. No enlarged abdominal or pelvic lymph nodes. Reproductive: Prostate is unremarkable. Other: No abdominal wall hernia or abnormality. No abdominopelvic ascites. No pneumoperitoneum. Musculoskeletal: No acute or significant osseous findings. IMPRESSION: 1. Mild circumferential wall thickening of the transverse and descending colon as well as the rectum, suggestive of proctocolitis. 2. Unchanged diffuse hepatic steatosis. 3. 4 mm pulmonary nodule in the right middle lobe. No follow-up needed if patient is low-risk. Non-contrast chest CT can be  considered in 12 months if patient is high-risk. This recommendation follows the consensus statement: Guidelines for Management of Incidental Pulmonary Nodules Detected on CT Images: From the Fleischner Society 2017; Radiology 2017; 284:228-243. 4.  Aortic atherosclerosis (ICD10-I70.0). Electronically Signed   By: WTitus DubinM.D.   On: 09/24/2018 17:39    Assessment and Plan:   SHudsen Feiis a 53y.o. y/o male has been referred for proctocolitis and GERD  Finding of proctocolitis on CT scan needs further evaluation However, given that patient was not having any diarrhea or fever chills or blood in stool, are not likely to be due to IBD, especially since patient is chronically constipated  We will schedule for colonoscopy to evaluate the abnormality seen on the CT and also due to patient's history of previous polyps, for polyp surveillance  Given ongoing GERD, uncontrolled despite daily PPI, EGD also indicated to rule out Barrett's and any obstructive lesions due to dysphagia  I have discussed alternative options, risks & benefits,  which include, but are not limited to, bleeding, infection, perforation,respiratory complication & drug reaction.  The patient agrees with this plan & written consent will be obtained.    Patient educated extensively on acid reflux lifestyle modification, including buying a bed wedge, not eating 3 hrs before bedtime, diet modifications, and handout given for the same.   High-fiber diet MiraLAX or Metamucil daily with goal of 1-2 soft bowel movements daily.  If not at goal, patient instructed to increase dose to twice daily.  If loose stools with the medication, patient asked to decrease the medication to every other day, or half dose daily.  Patient verbalized understanding  After his procedure, will start patient on pharmacologic therapy for constipation if needed  Dr VVonda Antigua Speech recognition software was used to dictate  the above  note.

## 2018-10-25 ENCOUNTER — Other Ambulatory Visit
Admission: RE | Admit: 2018-10-25 | Discharge: 2018-10-25 | Disposition: A | Discharge status: Home / Self Care | Payer: Managed Care, Other (non HMO) | Source: Ambulatory Visit | Attending: Gastroenterology | Admitting: Gastroenterology

## 2018-10-25 ENCOUNTER — Other Ambulatory Visit: Payer: Self-pay

## 2018-10-25 DIAGNOSIS — Z1159 Encounter for screening for other viral diseases: Secondary | ICD-10-CM | POA: Insufficient documentation

## 2018-10-25 LAB — SARS CORONAVIRUS 2 (TAT 6-24 HRS): SARS Coronavirus 2: NEGATIVE

## 2018-10-28 ENCOUNTER — Ambulatory Visit: Payer: Self-pay | Admitting: Family Medicine

## 2018-10-29 ENCOUNTER — Telehealth: Payer: Self-pay

## 2018-10-29 ENCOUNTER — Encounter: Payer: Self-pay | Admitting: *Deleted

## 2018-10-29 NOTE — Telephone Encounter (Signed)
Error

## 2018-10-30 ENCOUNTER — Ambulatory Visit: Payer: Managed Care, Other (non HMO) | Admitting: Anesthesiology

## 2018-10-30 ENCOUNTER — Encounter: Payer: Self-pay | Admitting: *Deleted

## 2018-10-30 ENCOUNTER — Ambulatory Visit
Admission: RE | Admit: 2018-10-30 | Discharge: 2018-10-30 | Disposition: A | Discharge status: Home / Self Care | Payer: Managed Care, Other (non HMO) | Attending: Gastroenterology | Admitting: Gastroenterology

## 2018-10-30 ENCOUNTER — Encounter
Admission: RE | Disposition: A | Discharge status: Home / Self Care | Payer: Self-pay | Source: Home / Self Care | Attending: Gastroenterology

## 2018-10-30 DIAGNOSIS — F419 Anxiety disorder, unspecified: Secondary | ICD-10-CM | POA: Diagnosis not present

## 2018-10-30 DIAGNOSIS — E119 Type 2 diabetes mellitus without complications: Secondary | ICD-10-CM | POA: Insufficient documentation

## 2018-10-30 DIAGNOSIS — Z7984 Long term (current) use of oral hypoglycemic drugs: Secondary | ICD-10-CM | POA: Insufficient documentation

## 2018-10-30 DIAGNOSIS — K219 Gastro-esophageal reflux disease without esophagitis: Secondary | ICD-10-CM | POA: Diagnosis not present

## 2018-10-30 DIAGNOSIS — I252 Old myocardial infarction: Secondary | ICD-10-CM | POA: Diagnosis not present

## 2018-10-30 DIAGNOSIS — Z87891 Personal history of nicotine dependence: Secondary | ICD-10-CM | POA: Diagnosis not present

## 2018-10-30 DIAGNOSIS — R1310 Dysphagia, unspecified: Secondary | ICD-10-CM | POA: Diagnosis not present

## 2018-10-30 DIAGNOSIS — Z955 Presence of coronary angioplasty implant and graft: Secondary | ICD-10-CM | POA: Insufficient documentation

## 2018-10-30 DIAGNOSIS — R112 Nausea with vomiting, unspecified: Secondary | ICD-10-CM | POA: Insufficient documentation

## 2018-10-30 DIAGNOSIS — K6389 Other specified diseases of intestine: Secondary | ICD-10-CM

## 2018-10-30 DIAGNOSIS — Z1211 Encounter for screening for malignant neoplasm of colon: Secondary | ICD-10-CM | POA: Diagnosis present

## 2018-10-30 DIAGNOSIS — K3189 Other diseases of stomach and duodenum: Secondary | ICD-10-CM | POA: Diagnosis not present

## 2018-10-30 DIAGNOSIS — K648 Other hemorrhoids: Secondary | ICD-10-CM | POA: Diagnosis not present

## 2018-10-30 DIAGNOSIS — Z888 Allergy status to other drugs, medicaments and biological substances status: Secondary | ICD-10-CM | POA: Diagnosis not present

## 2018-10-30 DIAGNOSIS — Z951 Presence of aortocoronary bypass graft: Secondary | ICD-10-CM | POA: Diagnosis not present

## 2018-10-30 DIAGNOSIS — Z860100 Personal history of colon polyps, unspecified: Secondary | ICD-10-CM

## 2018-10-30 DIAGNOSIS — Z7982 Long term (current) use of aspirin: Secondary | ICD-10-CM | POA: Insufficient documentation

## 2018-10-30 DIAGNOSIS — Z79899 Other long term (current) drug therapy: Secondary | ICD-10-CM | POA: Diagnosis not present

## 2018-10-30 DIAGNOSIS — J45909 Unspecified asthma, uncomplicated: Secondary | ICD-10-CM | POA: Diagnosis not present

## 2018-10-30 DIAGNOSIS — F3181 Bipolar II disorder: Secondary | ICD-10-CM | POA: Diagnosis not present

## 2018-10-30 DIAGNOSIS — I2510 Atherosclerotic heart disease of native coronary artery without angina pectoris: Secondary | ICD-10-CM | POA: Insufficient documentation

## 2018-10-30 HISTORY — PX: ESOPHAGOGASTRODUODENOSCOPY (EGD) WITH PROPOFOL: SHX5813

## 2018-10-30 HISTORY — PX: COLONOSCOPY WITH PROPOFOL: SHX5780

## 2018-10-30 LAB — GLUCOSE, CAPILLARY: Glucose-Capillary: 104 mg/dL — ABNORMAL HIGH (ref 70–99)

## 2018-10-30 SURGERY — COLONOSCOPY WITH PROPOFOL
Anesthesia: General

## 2018-10-30 MED ORDER — PANTOPRAZOLE SODIUM 20 MG PO TBEC: 20.0000 mg | DELAYED_RELEASE_TABLET | Freq: Two times a day (BID) | ORAL | 1 refills | Status: DC

## 2018-10-30 MED ORDER — PROPOFOL 500 MG/50ML IV EMUL
INTRAVENOUS | Status: DC | PRN
  Administered 2018-10-30: 125 ug/kg/min via INTRAVENOUS

## 2018-10-30 MED ORDER — FENTANYL CITRATE (PF) 100 MCG/2ML IJ SOLN
INTRAMUSCULAR | Status: DC | PRN
  Administered 2018-10-30 (×2): 25 ug via INTRAVENOUS

## 2018-10-30 MED ORDER — LIDOCAINE HCL (CARDIAC) PF 100 MG/5ML IV SOSY
PREFILLED_SYRINGE | INTRAVENOUS | Status: DC | PRN
  Administered 2018-10-30: 60 mg via INTRAVENOUS

## 2018-10-30 MED ORDER — PHENYLEPHRINE HCL (PRESSORS) 10 MG/ML IV SOLN
INTRAVENOUS | Status: AC
  Filled 2018-10-30: qty 1

## 2018-10-30 MED ORDER — LIDOCAINE HCL (PF) 2 % IJ SOLN
INTRAMUSCULAR | Status: AC
  Filled 2018-10-30: qty 10

## 2018-10-30 MED ORDER — PHENYLEPHRINE HCL (PRESSORS) 10 MG/ML IV SOLN
INTRAVENOUS | Status: DC | PRN
  Administered 2018-10-30 (×4): 50 ug via INTRAVENOUS

## 2018-10-30 MED ORDER — PROPOFOL 10 MG/ML IV BOLUS
INTRAVENOUS | Status: DC | PRN
  Administered 2018-10-30: 20 mg via INTRAVENOUS
  Administered 2018-10-30: 50 mg via INTRAVENOUS

## 2018-10-30 MED ORDER — EPHEDRINE SULFATE 50 MG/ML IJ SOLN
INTRAMUSCULAR | Status: AC
  Filled 2018-10-30: qty 1

## 2018-10-30 MED ORDER — FENTANYL CITRATE (PF) 100 MCG/2ML IJ SOLN
INTRAMUSCULAR | Status: AC
  Filled 2018-10-30: qty 2

## 2018-10-30 MED ORDER — EPHEDRINE SULFATE 50 MG/ML IJ SOLN
INTRAMUSCULAR | Status: DC | PRN
  Administered 2018-10-30: 5 mg via INTRAVENOUS

## 2018-10-30 MED ORDER — SODIUM CHLORIDE 0.9 % IV SOLN
INTRAVENOUS | Status: DC
  Administered 2018-10-30: 1000 mL via INTRAVENOUS

## 2018-10-30 MED ORDER — PROPOFOL 500 MG/50ML IV EMUL
INTRAVENOUS | Status: AC
  Filled 2018-10-30: qty 50

## 2018-10-30 NOTE — Anesthesia Postprocedure Evaluation (Signed)
Anesthesia Post Note  Patient: [name]  Procedure(Hall) Performed: COLONOSCOPY WITH PROPOFOL (N/A ) ESOPHAGOGASTRODUODENOSCOPY (EGD) WITH PROPOFOL (N/A )  Patient location during evaluation: Endoscopy Anesthesia Type: General Level of consciousness: awake and alert Pain management: pain level controlled Vital Signs Assessment: post-procedure vital signs reviewed and stable Respiratory status: spontaneous breathing, nonlabored ventilation, respiratory function stable and patient connected to nasal cannula oxygen Cardiovascular status: blood pressure returned to baseline and stable Postop Assessment: no apparent nausea or vomiting Anesthetic complications: no     Last Vitals:  Vitals:   10/30/18 0917 10/30/18 1051  BP: 124/74 108/70  Pulse: 60 72  Resp: 18 18  Temp: 36.6 C (!) 36.3 C  SpO2: 100% 100%    Last Pain:  Vitals:   10/30/18 1101  TempSrc:   PainSc: 0-No pain                 Vincent Hall

## 2018-10-30 NOTE — H&P (Signed)
Vonda Antigua, MD [address], [place], [place], [place], [address] [address], [place], [place], [place], [address] Phone: [phone]  Fax: [phone]  Primary Care Physician:  Birdie Sons, MD   Pre-Procedure History & Physical: HPI:  Vincent Hall is a 53 y.o. male is here for a colonoscopy and EGD.   Past Medical History:  Diagnosis Date  . Anxiety   . Asthma   . Bipolar 2 disorder ([facility])   . Coronary artery disease   . Diabetes ([facility])   . Fatty liver   . History of MI (myocardial infarction)   . STEMI (ST elevation myocardial infarction) ([facility]) 06/22/2013    Past Surgical History:  Procedure Laterality Date  . APPENDECTOMY    . CARDIAC CATHETERIZATION  06/24/2013  . COLONOSCOPY WITH PROPOFOL N/A 09/23/2014   Procedure: COLONOSCOPY WITH PROPOFOL;  Surgeon: Lucilla Lame, MD;  Location: [facility];  Service: Endoscopy;  Laterality: N/A;  . CORONARY ANGIOPLASTY     stent placement to LAD  . CORONARY ARTERY BYPASS GRAFT  07/31/2013   x 3 vessels  . FINGER SURGERY    . NASAL SINUS SURGERY    . POLYPECTOMY  09/23/2014   Procedure: POLYPECTOMY;  Surgeon: Lucilla Lame, MD;  Location: [facility];  Service: Endoscopy;;    Prior to Admission medications   Medication Sig Start Date End Date Taking? Authorizing Provider  aspirin 81 MG tablet Take 81 mg by mouth daily.   Yes [provider]  azelastine (ASTELIN) 0.1 % nasal spray Place 2 sprays into both nostrils 2 (two) times daily. 07/08/15  Yes Birdie Sons, MD  glipiZIDE (GLUCOTROL XL) 10 MG 24 hr tablet Take 1/2 tablet every morning 07/29/18  Yes Birdie Sons, MD  glucose blood (ONE TOUCH ULTRA TEST) test strip Check blood sugar 3 times daily E11.65 (uncontrolled insulin dependent diabetes) 04/19/18  Yes Birdie Sons, MD  ketoconazole (NIZORAL) 2 % cream Apply 1 application topically daily. 04/30/18  Yes Birdie Sons, MD  metFORMIN (GLUCOPHAGE) 500 MG tablet Take 1 tablet  (500 mg total) by mouth 2 (two) times daily with a meal. 04/30/18  Yes Birdie Sons, MD  mometasone (ELOCON) 0.1 % cream One application daily as needed for rash 11/16/17  Yes Carmon Ginsberg, PA  pioglitazone (ACTOS) 15 MG tablet Take 1 tablet (15 mg total) by mouth daily. 02/22/18  Yes Birdie Sons, MD  Plecanatide (TRULANCE) 3 MG TABS Take 3 mg by mouth daily. 05/24/18  Yes Birdie Sons, MD  RABEprazole (ACIPHEX) 20 MG tablet TAKE ONE TABLET BY MOUTH DAILY IN PLACE OF DEXILANT 09/04/18  Yes Birdie Sons, MD  rosuvastatin (CRESTOR) 40 MG tablet TAKE ONE TABLET BY MOUTH DAILY 09/20/18  Yes Birdie Sons, MD  Semaglutide,0.25 or 0.5MG/DOS, (OZEMPIC, 0.25 OR 0.5 MG/DOSE,) 2 MG/1.5ML SOPN Inject 0.5 mg into the skin once a week. 07/29/18  Yes Birdie Sons, MD  tiZANidine (ZANAFLEX) 4 MG capsule Take 1 capsule (4 mg total) by mouth 2 (two) times daily as needed for muscle spasms. 03/26/18  Yes Gillis Santa, MD  albuterol (PROVENTIL HFA;VENTOLIN HFA) 108 (90 Base) MCG/ACT inhaler Inhale 2 puffs into the lungs every 6 (six) hours as needed for wheezing or shortness of breath. 03/25/18   Birdie Sons, MD  ALPRAZolam Duanne Moron) 0.5 MG tablet TAKE ONE TABLET BY MOUTH DAILY AS NEEDED FOR ANXIETY Patient not taking: Reported on 10/30/2018 08/20/18   Birdie Sons, MD  azelastine (OPTIVAR) 0.05 % ophthalmic solution drop affect eye twice a day Patient not taking: Reported on 10/30/2018 07/08/15   Birdie Sons, MD  Blood Glucose Monitoring Suppl (ONE TOUCH ULTRA 2) w/Device KIT USE AS DIRECTED 05/16/18   Birdie Sons, MD  citalopram (CELEXA) 20 MG tablet Take 20 mg by mouth daily. Prescribed by his psychiatrist    [provider]  cyclobenzaprine (FLEXERIL) 5 MG tablet TAKE ONE TABLET BY MOUTH THREE TIMES A DAY AS NEEDED FOR MUSCLE SPASMS Patient not taking: Reported on 10/30/2018 03/29/18   Birdie Sons, MD  dexlansoprazole (DEXILANT) 60 MG capsule Take 1 capsule (60 mg  total) by mouth daily. 05/09/18   Birdie Sons, MD  gabapentin (NEURONTIN) 300 MG capsule Take 1 capsule (300 mg total) by mouth 3 (three) times daily. Patient not taking: Reported on 05/21/2018 02/26/18 05/27/18  Gillis Santa, MD  nitroGLYCERIN (NITROSTAT) 0.4 MG SL tablet Place 1 tablet (0.4 mg total) under the tongue every 5 (five) minutes as needed for chest pain. 05/09/18   Birdie Sons, MD  oxyCODONE (ROXICODONE) 15 MG immediate release tablet Take 1 tablet (15 mg total) by mouth every 4 (four) hours as needed for pain. 10/17/18   Birdie Sons, MD  QUEtiapine (SEROQUEL) 25 MG tablet TAKE 1 TABLET (25 MG TOTAL) BY MOUTH AT BEDTIME. FOR MOOD AND SLEEP Patient taking differently: Take 12.5-25 mg by mouth at bedtime. For mood and sleep 02/04/18   Ursula Alert, MD  risperiDONE (RISPERDAL) 1 MG tablet Take 1 tablet by mouth daily. 03/14/18   [provider]  traZODone (DESYREL) 100 MG tablet Take 2 tablets (200 mg total) by mouth at bedtime. Patient not taking: Reported on 10/30/2018 07/12/18   Birdie Sons, MD    Allergies as of 10/23/2018 - Review Complete 10/22/2018  Allergen Reaction Noted  . Montelukast Hives 06/03/2015  . Depakote [divalproex sodium] Rash 01/11/2018  . Invokana [canagliflozin] Other (See Comments) 06/17/2018  . Atorvastatin Other (See Comments) 08/01/2013  . Singulair [montelukast sodium] Hives 09/18/2014    Family History  Problem Relation Age of Onset  . Hyperlipidemia Mother   . Heart attack Father   . Hyperlipidemia Father   . Diabetes Father   . Heart disease Father     Social History   Socioeconomic History  . Marital status: Married    Spouse name: clementin  . Number of children: 2  . Years of education: HS Grad  . Highest education level: Associate degree: occupational, Hotel manager, or vocational program  Occupational History  . Occupation: [employer]  Social Needs  . Financial resource strain: Somewhat hard  . Food insecurity     Worry: Never true    Inability: Never true  . Transportation needs    Medical: No    Non-medical: No  Tobacco Use  . Smoking status: Former Smoker    Packs/day: 1.50    Years: 30.00    Pack years: 45.00    Types: Cigarettes    Quit date: 08/08/2013    Years since quitting: 5.2  . Smokeless tobacco: Never Used  Substance and Sexual Activity  . Alcohol use: No  . Drug use: Never  . Sexual activity: Not Currently  Lifestyle  . Physical activity    Days per week: 5 days    Minutes per session: 60 min  . Stress: Very much  Relationships  . Social Herbalist on phone: Not on file    Gets  together: Not on file    Attends religious service: Never    Active member of club or organization: No    Attends meetings of clubs or organizations: Never    Relationship status: Married  . Intimate partner violence    Fear of current or ex partner: No    Emotionally abused: No    Physically abused: No    Forced sexual activity: No  Other Topics Concern  . Not on file  Social History Narrative  . Not on file    Review of Systems: See HPI, otherwise negative ROS  Physical Exam: BP 124/74   Pulse 60   Temp 97.8 F (36.6 C) (Tympanic)   Resp 18   Ht _0  (1.727 m)   Wt 84.4 kg   SpO2 100%   BMI 28.28 kg/m  General:   Alert,  pleasant and cooperative in NAD Head:  Normocephalic and atraumatic. Neck:  Supple; no masses or thyromegaly. Lungs:  Clear throughout to auscultation, normal respiratory effort.    Heart:  +S1, +S2, Regular rate and rhythm, No edema. Abdomen:  Soft, nontender and nondistended. Normal bowel sounds, without guarding, and without rebound.   Neurologic:  Alert and  oriented x4;  grossly normal neurologically.  Impression/Plan: Vincent Hall is here for a colonoscopy to be performed for polyp surveillance and EGD for Acid Reflux, dysphagia.  Risks, benefits, limitations, and alternatives regarding the procedures have been reviewed with the  patient.  Questions have been answered.  All parties agreeable.   Virgel Manifold, MD  10/30/2018, 9:56 AM

## 2018-10-30 NOTE — Op Note (Signed)
[facility] Patient Name: Vincent Hall Procedure Date: 10/30/2018 9:46 AM MRN: [id number] Account #: [id number] Date of Birth: 04-07-1966 Admit Type: Outpatient Age: 53 Room: [facility] ENDO ROOM 2 Gender: Male Note Status: Finalized Procedure:            Upper GI endoscopy Indications:          Dysphagia, Heartburn Providers:            Caidan Hubbert B. Bonna Gains MD, MD Medicines:            Monitored Anesthesia Care Complications:        No immediate complications. Procedure:            Pre-Anesthesia Assessment:                       - Prior to the procedure, a History and Physical was                        performed, and patient medications, allergies and                        sensitivities were reviewed. The patient's tolerance of                        previous anesthesia was reviewed.                       - The risks and benefits of the procedure and the                        sedation options and risks were discussed with the                        patient. All questions were answered and informed                        consent was obtained.                       - Patient identification and proposed procedure were                        verified prior to the procedure by the physician, the                        nurse, the anesthesiologist, the anesthetist and the                        technician. The procedure was verified in the procedure                        room.                       - ASA Grade Assessment: II - A patient with mild                        systemic disease.                       After obtaining informed consent, the endoscope was  passed under direct vision. Throughout the procedure,                        the patient's blood pressure, pulse, and oxygen                        saturations were monitored continuously. The Endoscope                        was introduced through the mouth, and advanced  to the                        second part of duodenum. The upper GI endoscopy was                        accomplished with ease. The patient tolerated the                        procedure well. Findings:      The examined esophagus was normal. Biopsies were obtained from the       proximal and distal esophagus with cold forceps for histology of       suspected eosinophilic esophagitis.      Patchy mildly erythematous mucosa without bleeding was found in the       gastric antrum. Biopsies were taken with a cold forceps for histology.       Biopsies were obtained in the gastric body, at the incisura and in the       gastric antrum with cold forceps for histology.      The duodenal bulb, second portion of the duodenum and examined duodenum       were normal. Impression:           - Normal esophagus. Biopsied.                       - Erythematous mucosa in the antrum. Biopsied.                       - Normal duodenal bulb, second portion of the duodenum                        and examined duodenum.                       - Biopsies were obtained in the gastric body, at the                        incisura and in the gastric antrum. Recommendation:       - Await pathology results.                       - Discharge patient to home (with escort).                       - Advance diet as tolerated.                       - Continue present medications.                       - Patient has a contact number available for  emergencies. The signs and symptoms of potential                        delayed complications were discussed with the patient.                        Return to normal activities tomorrow. Written discharge                        instructions were provided to the patient.                       - Discharge patient to home (with escort).                       - The findings and recommendations were discussed with                        the patient.                        - The findings and recommendations were discussed with                        the patient's family.                       - Follow an antireflux regimen. Procedure Code(s):    --- Professional ---                       [phone], Esophagogastroduodenoscopy, flexible, transoral;                        with biopsy, single or multiple Diagnosis Code(s):    --- Professional ---                       K31.89, Other diseases of stomach and duodenum                       R13.10, Dysphagia, unspecified                       R12, Heartburn CPT copyright 2019 American Medical Association. All rights reserved. The codes documented in this report are preliminary and upon coder review may  be revised to meet current compliance requirements.  Vonda Antigua, MD Margretta Sidle B. Bonna Gains MD, MD 10/30/2018 10:14:09 AM This report has been signed electronically. Number of Addenda: 0 Note Initiated On: 10/30/2018 9:46 AM Estimated Blood Loss: Estimated blood loss: none.      [facility]

## 2018-10-30 NOTE — Anesthesia Preprocedure Evaluation (Signed)
Anesthesia Evaluation  Patient identified by MRN, date of birth, ID band Patient awake    Reviewed: Allergy & Precautions, NPO status , Patient's Chart, lab work & pertinent test results, reviewed documented beta blocker date and time   Airway Mallampati: III  TM Distance: >3 FB     Dental  (+) Chipped   Pulmonary asthma , former smoker,           Cardiovascular + CAD, + Past MI, + Cardiac Stents and + CABG       Neuro/Psych PSYCHIATRIC DISORDERS Anxiety Depression Bipolar Disorder    GI/Hepatic GERD  ,  Endo/Other  diabetes, Type 2  Renal/GU      Musculoskeletal   Abdominal   Peds  Hematology   Anesthesia Other Findings EKG 1 yr ago ok.  Reproductive/Obstetrics                             Anesthesia Physical Anesthesia Plan  ASA: III  Anesthesia Plan: General   Post-op Pain Management:    Induction: Intravenous  PONV Risk Score and Plan:   Airway Management Planned:   Additional Equipment:   Intra-op Plan:   Post-operative Plan:   Informed Consent: I have reviewed the patients History and Physical, chart, labs and discussed the procedure including the risks, benefits and alternatives for the proposed anesthesia with the patient or authorized representative who has indicated his/her understanding and acceptance.       Plan Discussed with: CRNA  Anesthesia Plan Comments:         Anesthesia Quick Evaluation

## 2018-10-30 NOTE — Transfer of Care (Signed)
Immediate Anesthesia Transfer of Care Note  Patient: Vincent Hall  Procedure(s) Performed: COLONOSCOPY WITH PROPOFOL (N/A ) ESOPHAGOGASTRODUODENOSCOPY (EGD) WITH PROPOFOL (N/A )  Patient Location: PACU  Anesthesia Type:General  Level of Consciousness: awake  Airway & Oxygen Therapy: Patient Spontanous Breathing and Patient connected to face mask oxygen  Post-op Assessment: Report given to RN and Post -op Vital signs reviewed and stable  Post vital signs: stable  Last Vitals:  Vitals Value Taken Time  BP 108/70 10/30/18 1051  Temp 36.3 C 10/30/18 1051  Pulse 70 10/30/18 1052  Resp 15 10/30/18 1052  SpO2 100 % 10/30/18 1052  Vitals shown include unvalidated device data.  Last Pain:  Vitals:   10/30/18 1051  TempSrc: Tympanic  PainSc: 0-No pain         Complications: No apparent anesthesia complications

## 2018-10-30 NOTE — Op Note (Signed)
[facility]astroenter[facility] Patient Name: Anquan Azzarello Procedure Date: 10/30/2018 9:46 AM MRN: [id number] Account #: [id number] Date of Birth: 03/01/1966 Admit Type: Outpatient Age: 53 Room: [facility] ENDO ROOM 2 Gender: Male Note Status: Finalized Procedure:            Colonoscopy Indications:          High risk colon cancer surveillance: Personal history                        of colonic polyps Providers:            Christal Lagerstrom B. Bonna Gains MD, MD Medicines:            Monitored Anesthesia Care Complications:        No immediate complications. Procedure:            Pre-Anesthesia Assessment:                       - Prior to the procedure, a History and Physical was                        performed, and patient medications, allergies and                        sensitivities were reviewed. The patient's tolerance of                        previous anesthesia was reviewed.                       - The risks and benefits of the procedure and the                        sedation options and risks were discussed with the                        patient. All questions were answered and informed                        consent was obtained.                       - Patient identification and proposed procedure were                        verified prior to the procedure by the physician, the                        nurse, the anesthetist and the technician. The                        procedure was verified in the pre-procedure area in the                        procedure room in the endoscopy suite.                       - ASA Grade Assessment: II - A patient with mild                        systemic disease.                       -  After reviewing the risks and benefits, the patient                        was deemed in satisfactory condition to undergo the                        procedure.                       After obtaining informed consent, the colonoscope was            passed under direct vision. Throughout the procedure,                        the patient's blood pressure, pulse, and oxygen                        saturations were monitored continuously. The                        Colonoscope was introduced through the anus and                        advanced to the the cecum, identified by appendiceal                        orifice and ileocecal valve. The colonoscopy was                        performed with ease. The patient tolerated the                        procedure well. The quality of the bowel preparation                        was good. Findings:      The perianal and digital rectal examinations were normal.      A patchy area of mildly congested mucosa was found in the cecum.       Biopsies were taken with a cold forceps for histology. The abnormal       mucosa was in the peri-appendiceal area. The biopsies were taken away       from the appendiceal orifice and not at the orifice itself.      The rectum, sigmoid colon, descending colon, transverse colon, ascending       colon and cecum appeared normal.      Non-bleeding internal hemorrhoids were found during retroflexion. Impression:           - Congested mucosa in the cecum. Biopsied.                       - The rectum, sigmoid colon, descending colon,                        transverse colon, ascending colon and cecum are normal.                       - Non-bleeding internal hemorrhoids.                       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 Recommendation:       - Discharge patient to home.                       - Resume previous diet.                       - Continue present medications.                       - Repeat colonoscopy in 5 years for surveillance.                       - Return to primary care physician as previously                        scheduled.                       - The  findings and recommendations were discussed with                        the patient.                       - The findings and recommendations were discussed with                        the patient's family.                       - Await pathology results.                       - High fiber diet. Procedure Code(s):    --- Professional ---                       [phone], Colonoscopy, flexible; with biopsy, single or                        multiple Diagnosis Code(s):    --- Professional ---                       Z86.010, Personal history of colonic polyps                       K63.89, Other specified diseases of intestine                       K64.8, Other hemorrhoids CPT copyright 2019 American Medical Association. All rights reserved. The codes documented in this report are preliminary and upon coder review may  be revised to meet current compliance requirements.  Vonda Antigua, MD Margretta Sidle B. Bonna Gains MD, MD 10/30/2018 10:54:12 AM This report has been signed electronically. Number of Addenda: 0 Note Initiated On: 10/30/2018 9:46 AM Scope Withdrawal Time: 0 hours 17 minutes 18 seconds  Total Procedure Duration: 0 hours 27 minutes 57 seconds  Estimated Blood Loss: Estimated blood loss: none.      [facility]

## 2018-10-30 NOTE — Anesthesia Post-op Follow-up Note (Signed)
Anesthesia QCDR form completed.        

## 2018-10-31 ENCOUNTER — Encounter: Payer: Self-pay | Admitting: Gastroenterology

## 2018-11-01 LAB — SURGICAL PATHOLOGY

## 2018-11-11 ENCOUNTER — Ambulatory Visit (INDEPENDENT_AMBULATORY_CARE_PROVIDER_SITE_OTHER): Payer: Managed Care, Other (non HMO) | Admitting: Family Medicine

## 2018-11-11 ENCOUNTER — Other Ambulatory Visit: Payer: Self-pay

## 2018-11-11 ENCOUNTER — Encounter: Payer: Self-pay | Admitting: Family Medicine

## 2018-11-11 VITALS — BP 120/64 | HR 63 | Temp 97.8°F | Resp 16 | Wt 191.0 lb

## 2018-11-11 DIAGNOSIS — M545 Low back pain, unspecified: Secondary | ICD-10-CM

## 2018-11-11 DIAGNOSIS — I2510 Atherosclerotic heart disease of native coronary artery without angina pectoris: Secondary | ICD-10-CM | POA: Diagnosis not present

## 2018-11-11 DIAGNOSIS — K760 Fatty (change of) liver, not elsewhere classified: Secondary | ICD-10-CM | POA: Diagnosis not present

## 2018-11-11 DIAGNOSIS — E1169 Type 2 diabetes mellitus with other specified complication: Secondary | ICD-10-CM | POA: Diagnosis not present

## 2018-11-11 DIAGNOSIS — Z951 Presence of aortocoronary bypass graft: Secondary | ICD-10-CM

## 2018-11-11 DIAGNOSIS — G8929 Other chronic pain: Secondary | ICD-10-CM

## 2018-11-11 LAB — POCT GLYCOSYLATED HEMOGLOBIN (HGB A1C)
Est. average glucose Bld gHb Est-mCnc: 140
Hemoglobin A1C: 6.5 % — AB (ref 4.0–5.6)

## 2018-11-11 MED ORDER — OZEMPIC (0.25 OR 0.5 MG/DOSE) 2 MG/1.5ML ~~LOC~~ SOPN: 0.5000 mg | PEN_INJECTOR | SUBCUTANEOUS | 12 refills | Status: DC

## 2018-11-11 MED ORDER — OXYCODONE HCL 15 MG PO TABS: 15.0000 mg | ORAL_TABLET | ORAL | 0 refills | Status: DC | PRN

## 2018-11-11 NOTE — Patient Instructions (Signed)
.   Please review the attached list of medications and notify my office if there are any errors.   . Please bring all of your medications to every appointment so we can make sure that our medication list is the same as yours.   . We will have flu vaccines available after Labor Day. Please go to your pharmacy or call the office in early September to schedule you flu shot.   

## 2018-11-11 NOTE — Progress Notes (Signed)
Patient: Vincent Hall Male    DOB: 06-23-1965   53 y.o.   MRN: [id number] Visit Date: 11/11/2018  Today's Provider: [name], MD   Chief Complaint  Patient presents with  . Diabetes   Subjective:     HPI  Diabetes Mellitus Type II, Follow-up:   Lab Results  Component Value Date   HGBA1C 7.7 (A) 07/29/2018   HGBA1C 9.3 (A) 06/17/2018   HGBA1C 7.9 (H) 03/25/2018    Last seen for diabetes 4 months ago.  Management since then includes increasing Onglyza to 0.81m weekly and decreasing Glipizide to 545mdaily. . Marland Kitchene reports good compliance with treatment. He is not having side effects.  Current symptoms include none and have been stable. Home blood sugar records: fasting range: 106-119  Episodes of hypoglycemia? no   Current insulin regiment: Ozempic Most Recent Eye Exam: 1 year ago Weight trend: fluctuating a bit Prior visit with dietician: No Current exercise: walking Current diet habits: well balanced  Pertinent Labs:    Component Value Date/Time   CHOL 132 01/08/2018 1159   CHOL 167 07/26/2017 1138   TRIG 111 01/08/2018 1159   HDL 49 01/08/2018 1159   HDL 45 07/26/2017 1138   LDLCALC 61 01/08/2018 1159   LDLCALC 79 07/26/2017 1138   LDLCALC 124 (H) 01/31/2017 1230   CREATININE 0.84 09/24/2018 1451   CREATININE 0.93 01/31/2017 1230    Wt Readings from Last 3 Encounters:  11/11/18 191 lb (86.6 kg)  10/30/18 186 lb (84.4 kg)  10/22/18 189 lb (85.7 kg)    ------------------------------------------------------------------------  Follow up for Chronic Pain:  The patient was last seen for this 4 months ago. Changes made at last visit include none.  He reports good compliance with treatment. He feels that condition is Improved. He is not having side effects.   He is taking oxycodone/apap on schedule six times daily for chronic post CABG chest wall pain and intermittent back. It continues to work well. He has stopped all benzos and anxiety  is under pretty control, continue regular follow up psychiatry.  ------------------------------------------------------------------------------------   Allergies  Allergen Reactions  . Montelukast Hives    Singular  . Depakote [Divalproex Sodium] Rash  . Invokana [Canagliflozin] Other (See Comments)    Balanitis   . Atorvastatin Other (See Comments)    Muscle pain, neck stiffness  . Singulair [Montelukast Sodium] Hives     Current Outpatient Medications:  .  albuterol (PROVENTIL HFA;VENTOLIN HFA) 108 (90 Base) MCG/ACT inhaler, Inhale 2 puffs into the lungs every 6 (six) hours as needed for wheezing or shortness of breath., Disp: 1 Inhaler, Rfl: 3 .  ALPRAZolam (XANAX) 0.5 MG tablet, TAKE ONE TABLET BY MOUTH DAILY AS NEEDED FOR ANXIETY, Disp: 20 tablet, Rfl: 2 .  aspirin 81 MG tablet, Take 81 mg by mouth daily., Disp: , Rfl:  .  azelastine (ASTELIN) 0.1 % nasal spray, Place 2 sprays into both nostrils 2 (two) times daily., Disp: 30 mL, Rfl: 6 .  azelastine (OPTIVAR) 0.05 % ophthalmic solution, drop affect eye twice a day, Disp: 6 mL, Rfl: 3 .  Blood Glucose Monitoring Suppl (ONE TOUCH ULTRA 2) w/Device KIT, USE AS DIRECTED, Disp: 1 each, Rfl: 0 .  citalopram (CELEXA) 20 MG tablet, Take 20 mg by mouth daily. Prescribed by his psychiatrist, Disp: , Rfl:  .  cyclobenzaprine (FLEXERIL) 5 MG tablet, TAKE ONE TABLET BY MOUTH THREE TIMES A DAY AS NEEDED FOR MUSCLE SPASMS, Disp: 3022  tablet, Rfl: 12 .  glipiZIDE (GLUCOTROL XL) 10 MG 24 hr tablet, Take 1/2 tablet every morning, Disp: 3 tablet, Rfl: 0 .  glucose blood (ONE TOUCH ULTRA TEST) test strip, Check blood sugar 3 times daily E11.65 (uncontrolled insulin dependent diabetes), Disp: 100 each, Rfl: 12 .  ketoconazole (NIZORAL) 2 % cream, Apply 1 application topically daily., Disp: 15 g, Rfl: 0 .  metFORMIN (GLUCOPHAGE) 500 MG tablet, Take 1 tablet (500 mg total) by mouth 2 (two) times daily with a meal., Disp: 60 tablet, Rfl: 1 .  mometasone  (ELOCON) 0.1 % cream, One application daily as needed for rash, Disp: 45 g, Rfl: 1 .  nitroGLYCERIN (NITROSTAT) 0.4 MG SL tablet, Place 1 tablet (0.4 mg total) under the tongue every 5 (five) minutes as needed for chest pain., Disp: 20 tablet, Rfl: 1 .  oxyCODONE (ROXICODONE) 15 MG immediate release tablet, Take 1 tablet (15 mg total) by mouth every 4 (four) hours as needed for pain., Disp: 120 tablet, Rfl: 0 .  pantoprazole (PROTONIX) 20 MG tablet, Take 1 tablet (20 mg total) by mouth 2 (two) times a day., Disp: 60 tablet, Rfl: 1 .  pioglitazone (ACTOS) 15 MG tablet, Take 1 tablet (15 mg total) by mouth daily., Disp: 30 tablet, Rfl: 12 .  Plecanatide (TRULANCE) 3 MG TABS, Take 3 mg by mouth daily., Disp: 30 tablet, Rfl: 5 .  QUEtiapine (SEROQUEL) 25 MG tablet, TAKE 1 TABLET (25 MG TOTAL) BY MOUTH AT BEDTIME. FOR MOOD AND SLEEP (Patient taking differently: Take 12.5-25 mg by mouth at bedtime. For mood and sleep), Disp: 30 tablet, Rfl: 0 .  risperiDONE (RISPERDAL) 1 MG tablet, Take 1 tablet by mouth daily., Disp: , Rfl: 0 .  rosuvastatin (CRESTOR) 40 MG tablet, TAKE ONE TABLET BY MOUTH DAILY, Disp: 30 tablet, Rfl: 12 .  Semaglutide,0.25 or 0.5MG/DOS, (OZEMPIC, 0.25 OR 0.5 MG/DOSE,) 2 MG/1.5ML SOPN, Inject 0.5 mg into the skin once a week., Disp: 2 pen, Rfl: 5 .  tiZANidine (ZANAFLEX) 4 MG capsule, Take 1 capsule (4 mg total) by mouth 2 (two) times daily as needed for muscle spasms., Disp: 60 capsule, Rfl: 0 .  traZODone (DESYREL) 100 MG tablet, Take 2 tablets (200 mg total) by mouth at bedtime., Disp: 60 tablet, Rfl: 5  Review of Systems  Constitutional: Negative for appetite change, chills and fever.  Respiratory: Negative for chest tightness, shortness of breath and wheezing.   Cardiovascular: Negative for chest pain and palpitations.  Gastrointestinal: Positive for abdominal pain. Negative for nausea and vomiting.    Social History   Tobacco Use  . Smoking status: Former Smoker     Packs/day: 1.50    Years: 30.00    Pack years: 45.00    Types: Cigarettes    Quit date: 08/08/2013    Years since quitting: 5.2  . Smokeless tobacco: Never Used  Substance Use Topics  . Alcohol use: No      Objective:   BP 120/64 (BP Location: Left Arm, Patient Position: Sitting, Cuff Size: Large)   Pulse 63   Temp 97.8 F (36.6 C) (Oral)   Resp 16   Wt 191 lb (86.6 kg)   SpO2 99% Comment: room air  BMI 29.04 kg/m  Vitals:   11/11/18 1446  BP: 120/64  Pulse: 63  Resp: 16  Temp: 97.8 F (36.6 C)  TempSrc: Oral  SpO2: 99%  Weight: 191 lb (86.6 kg)     Physical Exam  General Appearance:    Alert,  cooperative, no distress  Eyes:    PERRL, conjunctiva/corneas clear, EOM's intact       Lungs:     Clear to auscultation bilaterally, respirations unlabored  Heart:    Normal heart rate. Normal rhythm. No murmurs, rubs, or gallops.   Neurologic:   Awake, alert, oriented x 3. No apparent focal neurological           defect.        Results for orders placed or performed in visit on 11/11/18  POCT HgB A1C  Result Value Ref Range   Hemoglobin A1C 6.5 (A) 4.0 - 5.6 %   HbA1c POC (<> result, manual entry)     HbA1c, POC (prediabetic range)     HbA1c, POC (controlled diabetic range)     Est. average glucose Bld gHb Est-mCnc 140        Assessment & Plan    1. Type 2 diabetes mellitus with other specified complication, without long-term current use of insulin (HCC) Well controlled, with heart disease and fatty liver.  - Semaglutide,0.25 or 0.5MG/DOS, (OZEMPIC, 0.25 OR 0.5 MG/DOSE,) 2 MG/1.5ML SOPN; Inject 0.5 mg into the skin once a week.  Dispense: 4 pen; Refill: 12  2. Fatty liver Stable, continue monitor transaminases.   3. Coronary artery disease involving native coronary artery of native heart without angina pectoris Asymptomatic. Compliant with medication.  Continue aggressive risk factor modification.    4. Chronic midline low back pain without sciatica  -  oxyCODONE (ROXICODONE) 15 MG immediate release tablet; Take 1 tablet (15 mg total) by mouth every 4 (four) hours as needed for pain.  Dispense: 180 tablet; Refill: 0  5. Hx of CABG (chronic thoracic/chest wall pain) Pain adequate controlled, refill oxycodone/apap as above.    follow up 5 months.     [name], MD  [facility]

## 2018-11-19 ENCOUNTER — Telehealth: Payer: Self-pay

## 2018-11-19 NOTE — Telephone Encounter (Signed)
Pt notified of results. Pt stated he was not having any abdominal or diarrhea at this time. The only pain he is experiencing is in his back.

## 2018-11-19 NOTE — Telephone Encounter (Signed)
-----   Message from Virgel Manifold, MD sent at 11/18/2018  4:21 PM EDT ----- Jackelyn Poling please let patient know, the biopsies from his colon showed changes that could be related to medications like NSAIDs. Avoid NSAID use such as Ibuprofen, Aleeve, advil, motrin, BC and Goodie powder, Naproxen, Meloxicam and others. If he is not having any symptoms such as diarrhea or abdominal pain, this does not need any further workup. Repeat colonoscopy in 5 years.

## 2018-12-10 ENCOUNTER — Other Ambulatory Visit: Payer: Self-pay | Admitting: Family Medicine

## 2018-12-10 DIAGNOSIS — G8929 Other chronic pain: Secondary | ICD-10-CM

## 2018-12-10 NOTE — Telephone Encounter (Signed)
Patient called requesting refill on Oxycodone 15 mg. sent to [facility].

## 2018-12-11 MED ORDER — OXYCODONE HCL 15 MG PO TABS: 15.0000 mg | ORAL_TABLET | ORAL | 0 refills | Status: DC | PRN

## 2018-12-12 NOTE — Telephone Encounter (Signed)
Error, no message

## 2019-01-03 ENCOUNTER — Other Ambulatory Visit: Payer: Self-pay | Admitting: Gastroenterology

## 2019-01-07 ENCOUNTER — Telehealth: Payer: Self-pay | Admitting: Family Medicine

## 2019-01-07 DIAGNOSIS — M545 Low back pain, unspecified: Secondary | ICD-10-CM

## 2019-01-07 DIAGNOSIS — G8929 Other chronic pain: Secondary | ICD-10-CM

## 2019-01-07 NOTE — Telephone Encounter (Signed)
Pt needs refill on his oxycodone 15 mg  Catha Gosselin

## 2019-01-07 NOTE — Telephone Encounter (Signed)
Please review. KW 

## 2019-01-08 MED ORDER — OXYCODONE HCL 15 MG PO TABS: 15.0000 mg | ORAL_TABLET | ORAL | 0 refills | Status: DC | PRN

## 2019-01-18 ENCOUNTER — Other Ambulatory Visit: Payer: Self-pay | Admitting: Gastroenterology

## 2019-01-20 ENCOUNTER — Other Ambulatory Visit: Payer: Self-pay

## 2019-01-20 DIAGNOSIS — Z20822 Contact with and (suspected) exposure to covid-19: Secondary | ICD-10-CM

## 2019-01-20 NOTE — Telephone Encounter (Signed)
Last office visit 10/22/2018 constipation  Last refill 01/03/2019 0 refills  No appointment is scheduled

## 2019-01-21 LAB — NOVEL CORONAVIRUS, NAA: SARS-CoV-2, NAA: NOT DETECTED

## 2019-01-27 ENCOUNTER — Other Ambulatory Visit: Payer: Self-pay

## 2019-01-27 NOTE — Telephone Encounter (Signed)
Received a fax from pharmacy for Pantoprazole.  Last office visit 10/22/2018 Constipation  Last refill 01/03/2019 0 refills  No appointment is scheduled  We have already sent to Provider for her review. Waiting on response.

## 2019-02-05 ENCOUNTER — Other Ambulatory Visit: Payer: Self-pay | Admitting: Student in an Organized Health Care Education/Training Program

## 2019-02-17 ENCOUNTER — Other Ambulatory Visit: Payer: Self-pay

## 2019-02-17 DIAGNOSIS — G8929 Other chronic pain: Secondary | ICD-10-CM

## 2019-02-17 MED ORDER — OXYCODONE HCL 15 MG PO TABS: 15.0000 mg | ORAL_TABLET | ORAL | 0 refills | Status: DC | PRN

## 2019-02-17 NOTE — Telephone Encounter (Signed)
Last refill 01/08/2019

## 2019-02-17 NOTE — Telephone Encounter (Signed)
Patient called requesting a refill on his Oxycodone 15 MG. L.O.V. was on 11/11/2018.

## 2019-03-02 ENCOUNTER — Other Ambulatory Visit: Payer: Self-pay | Admitting: Gastroenterology

## 2019-03-03 NOTE — Telephone Encounter (Signed)
Last office visit 10/22/2018 GERD constipation  Last refill 01/27/2019 0 refills  No appointment is scheduled

## 2019-03-04 ENCOUNTER — Other Ambulatory Visit: Payer: Self-pay | Admitting: Family Medicine

## 2019-03-04 DIAGNOSIS — E1165 Type 2 diabetes mellitus with hyperglycemia: Secondary | ICD-10-CM

## 2019-03-14 ENCOUNTER — Other Ambulatory Visit: Payer: Self-pay | Admitting: Family Medicine

## 2019-03-14 DIAGNOSIS — G8929 Other chronic pain: Secondary | ICD-10-CM

## 2019-03-14 MED ORDER — OXYCODONE HCL 15 MG PO TABS: 15.0000 mg | ORAL_TABLET | ORAL | 0 refills | Status: DC | PRN

## 2019-03-14 NOTE — Telephone Encounter (Signed)
oxyCODONE (ROXICODONE) 15 MG immediate release tablet    Patient is requesting refill.    Pharmacy:  [place], [place] [phone] (Phone) [phone] (Fax)

## 2019-03-15 ENCOUNTER — Other Ambulatory Visit: Payer: Self-pay | Admitting: Family Medicine

## 2019-03-15 DIAGNOSIS — E1165 Type 2 diabetes mellitus with hyperglycemia: Secondary | ICD-10-CM

## 2019-03-24 ENCOUNTER — Other Ambulatory Visit: Payer: Self-pay | Admitting: Student in an Organized Health Care Education/Training Program

## 2019-03-24 DIAGNOSIS — M25552 Pain in left hip: Secondary | ICD-10-CM

## 2019-04-10 ENCOUNTER — Other Ambulatory Visit: Payer: Self-pay | Admitting: Gastroenterology

## 2019-04-14 NOTE — Telephone Encounter (Signed)
Last office visit 10/22/18 constipation  Last refill 03/05/2019 0 refills 30 tablets

## 2019-04-15 ENCOUNTER — Other Ambulatory Visit: Payer: Self-pay

## 2019-04-15 ENCOUNTER — Encounter: Payer: Self-pay | Admitting: Family Medicine

## 2019-04-15 ENCOUNTER — Ambulatory Visit (INDEPENDENT_AMBULATORY_CARE_PROVIDER_SITE_OTHER): Payer: Managed Care, Other (non HMO) | Admitting: Family Medicine

## 2019-04-15 VITALS — BP 92/60 | HR 73 | Temp 96.9°F | Wt 203.0 lb

## 2019-04-15 DIAGNOSIS — Z951 Presence of aortocoronary bypass graft: Secondary | ICD-10-CM

## 2019-04-15 DIAGNOSIS — E1169 Type 2 diabetes mellitus with other specified complication: Secondary | ICD-10-CM | POA: Diagnosis not present

## 2019-04-15 DIAGNOSIS — F332 Major depressive disorder, recurrent severe without psychotic features: Secondary | ICD-10-CM

## 2019-04-15 DIAGNOSIS — F419 Anxiety disorder, unspecified: Secondary | ICD-10-CM

## 2019-04-15 DIAGNOSIS — I25118 Atherosclerotic heart disease of native coronary artery with other forms of angina pectoris: Secondary | ICD-10-CM | POA: Diagnosis not present

## 2019-04-15 DIAGNOSIS — J301 Allergic rhinitis due to pollen: Secondary | ICD-10-CM

## 2019-04-15 DIAGNOSIS — G8929 Other chronic pain: Secondary | ICD-10-CM

## 2019-04-15 DIAGNOSIS — Z125 Encounter for screening for malignant neoplasm of prostate: Secondary | ICD-10-CM

## 2019-04-15 DIAGNOSIS — F1190 Opioid use, unspecified, uncomplicated: Secondary | ICD-10-CM

## 2019-04-15 DIAGNOSIS — M545 Low back pain: Secondary | ICD-10-CM

## 2019-04-15 DIAGNOSIS — E782 Mixed hyperlipidemia: Secondary | ICD-10-CM

## 2019-04-15 DIAGNOSIS — R0789 Other chest pain: Secondary | ICD-10-CM

## 2019-04-15 DIAGNOSIS — M533 Sacrococcygeal disorders, not elsewhere classified: Secondary | ICD-10-CM

## 2019-04-15 LAB — POCT GLYCOSYLATED HEMOGLOBIN (HGB A1C)
Est. average glucose Bld gHb Est-mCnc: 134
Hemoglobin A1C: 6.3 % — AB (ref 4.0–5.6)

## 2019-04-15 MED ORDER — AZELASTINE HCL 0.1 % NA SOLN: 2.0000 | Freq: Two times a day (BID) | NASAL | 6 refills | Status: DC

## 2019-04-15 MED ORDER — GLIPIZIDE ER 5 MG PO TB24: 5.0000 mg | ORAL_TABLET | Freq: Every day | ORAL | 3 refills | Status: DC

## 2019-04-15 MED ORDER — OXYCODONE HCL 15 MG PO TABS: 15.0000 mg | ORAL_TABLET | ORAL | 0 refills | Status: DC | PRN

## 2019-04-15 MED ORDER — AZELASTINE HCL 0.05 % OP SOLN: OPHTHALMIC | 3 refills | Status: DC

## 2019-04-15 NOTE — Progress Notes (Signed)
Patient: Vincent Hall Male    DOB: 1966-01-02   54 y.o.   MRN: [id number] Visit Date: 04/15/2019  Today's Provider: [name], MD   Chief Complaint  Patient presents with  . Diabetes  . Hypertension  . Pain  . Coronary Artery Disease   Subjective:     HPI  Diabetes Mellitus Type II, Follow-up:   Recent Labs       Lab Results  Component Value Date   HGBA1C 7.7 (A) 07/29/2018   HGBA1C 9.3 (A) 06/17/2018   HGBA1C 7.9 (H) 03/25/2018      Last seen for diabetes 4 months ago.  Management since then includes no change. He reports good compliance with treatment. He is not having side effects.  Current symptoms include none  Home blood sugar records: fasting range: 120-140's  Episodes of hypoglycemia? no              Current insulin regiment: Ozempic Most Recent Eye Exam: 06/06/2016 Weight trend:  Prior visit with dietician: No Current exercise: no Current diet habits: well balanced  Pertinent Labs: Lab Results  Component Value Date   CHOL 132 01/08/2018   HDL 49 01/08/2018   LDLCALC 61 01/08/2018   TRIG 111 01/08/2018   CHOLHDL 2.7 01/08/2018   Wt Readings from Last 3 Encounters:  04/15/19 203 lb (92.1 kg)  11/11/18 191 lb (86.6 kg)  10/30/18 186 lb (84.4 kg)   ------------------------------------------------------------------------  He denies any recent episodes of chest pains and breathing has been stable, he has not seen his cardiologist at [facility] since 2018 and would like to establish with local cardiologist.   He continues on oxycodone scheduled for chronic post CABG chest wall pain and chronic low back pain. Current regiment does not completely control pain, but does help signifantly . He was evaluated by Dr. Holley Raring in February of 2020 who did not feel there was any structural issues causing his back pain. He was referred to Dr. Manuella Ghazi for NCS which were apparently not done due to onset of Covid 19 pandemic at that time. Patient  requests referral back to Dr. Manuella Ghazi to complete evaluation.   Allergies  Allergen Reactions  . Montelukast Hives    Singular  . Depakote [Divalproex Sodium] Rash  . Invokana [Canagliflozin] Other (See Comments)    Balanitis   . Atorvastatin Other (See Comments)    Muscle pain, neck stiffness  . Singulair [Montelukast Sodium] Hives     Current Outpatient Medications:  .  albuterol (PROVENTIL HFA;VENTOLIN HFA) 108 (90 Base) MCG/ACT inhaler, Inhale 2 puffs into the lungs every 6 (six) hours as needed for wheezing or shortness of breath., Disp: 1 Inhaler, Rfl: 3 .  aspirin 81 MG tablet, Take 81 mg by mouth daily., Disp: , Rfl:  .  azelastine (ASTELIN) 0.1 % nasal spray, Place 2 sprays into both nostrils 2 (two) times daily., Disp: 30 mL, Rfl: 6 .  azelastine (OPTIVAR) 0.05 % ophthalmic solution, drop affect eye twice a day, Disp: 6 mL, Rfl: 3 .  Blood Glucose Monitoring Suppl (ONE TOUCH ULTRA 2) w/Device KIT, USE AS DIRECTED, Disp: 1 each, Rfl: 0 .  citalopram (CELEXA) 20 MG tablet, Take 20 mg by mouth daily. Prescribed by his psychiatrist, Disp: , Rfl:  .  cyclobenzaprine (FLEXERIL) 5 MG tablet, TAKE ONE TABLET BY MOUTH THREE TIMES A DAY AS NEEDED FOR MUSCLE SPASMS, Disp: 30 tablet, Rfl: 12 .  glucose blood (ONE TOUCH ULTRA TEST) test strip,  Check blood sugar 3 times daily E11.65 (uncontrolled insulin dependent diabetes), Disp: 100 each, Rfl: 12 .  ketoconazole (NIZORAL) 2 % cream, Apply 1 application topically daily., Disp: 15 g, Rfl: 0 .  metFORMIN (GLUCOPHAGE) 500 MG tablet, Take 1 tablet (500 mg total) by mouth 2 (two) times daily with a meal., Disp: 60 tablet, Rfl: 1 .  mometasone (ELOCON) 0.1 % cream, One application daily as needed for rash, Disp: 45 g, Rfl: 1 .  nitroGLYCERIN (NITROSTAT) 0.4 MG SL tablet, Place 1 tablet (0.4 mg total) under the tongue every 5 (five) minutes as needed for chest pain., Disp: 20 tablet, Rfl: 1 .  oxyCODONE (ROXICODONE) 15 MG immediate release tablet,  Take 1 tablet (15 mg total) by mouth every 4 (four) hours as needed for pain., Disp: 180 tablet, Rfl: 0 .  pantoprazole (PROTONIX) 20 MG tablet, TAKE ONE TABLET BY MOUTH DAILY, Disp: 30 tablet, Rfl: 0 .  pioglitazone (ACTOS) 15 MG tablet, TAKE ONE TABLET BY MOUTH DAILY, Disp: 30 tablet, Rfl: 11 .  Plecanatide (TRULANCE) 3 MG TABS, Take 3 mg by mouth daily., Disp: 30 tablet, Rfl: 5 .  QUEtiapine (SEROQUEL) 25 MG tablet, TAKE 1 TABLET (25 MG TOTAL) BY MOUTH AT BEDTIME. FOR MOOD AND SLEEP (Patient taking differently: Take 12.5-25 mg by mouth at bedtime. For mood and sleep), Disp: 30 tablet, Rfl: 0 .  risperiDONE (RISPERDAL) 1 MG tablet, Take 1 tablet by mouth daily., Disp: , Rfl: 0 .  rosuvastatin (CRESTOR) 40 MG tablet, TAKE ONE TABLET BY MOUTH DAILY, Disp: 30 tablet, Rfl: 12 .  Semaglutide,0.25 or 0.'5MG'$ /DOS, (OZEMPIC, 0.25 OR 0.5 MG/DOSE,) 2 MG/1.5ML SOPN, Inject 0.5 mg into the skin once a week., Disp: 4 pen, Rfl: 12 .  tiZANidine (ZANAFLEX) 4 MG capsule, Take 1 capsule (4 mg total) by mouth 2 (two) times daily as needed for muscle spasms., Disp: 60 capsule, Rfl: 0 .  traZODone (DESYREL) 100 MG tablet, Take 2 tablets (200 mg total) by mouth at bedtime., Disp: 60 tablet, Rfl: 5 .  glipiZIDE (GLIPIZIDE XL) 5 MG 24 hr tablet, Take 1 tablet (5 mg total) by mouth daily with breakfast., Disp: 90 tablet, Rfl: 3  Review of Systems  Constitutional: Negative.   Respiratory: Positive for shortness of breath.   Cardiovascular: Negative.   Endocrine: Negative.   Musculoskeletal: Negative.     Social History   Tobacco Use  . Smoking status: Former Smoker    Packs/day: 1.50    Years: 30.00    Pack years: 45.00    Types: Cigarettes    Quit date: 08/08/2013    Years since quitting: 5.6  . Smokeless tobacco: Never Used  Substance Use Topics  . Alcohol use: No      Objective:   BP 92/60 (BP Location: Right Arm, Patient Position: Sitting, Cuff Size: Normal)   Pulse 73   Temp (!) 96.9 F (36.1 C)  (Temporal)   Wt 203 lb (92.1 kg)   BMI 30.87 kg/m  Vitals:   04/15/19 1433  BP: 92/60  Pulse: 73  Temp: (!) 96.9 F (36.1 C)  TempSrc: Temporal  Weight: 203 lb (92.1 kg)  Body mass index is 30.87 kg/m.   Physical Exam   General Appearance:    Overweight male in no acute distress  Eyes:    PERRL, conjunctiva/corneas clear, EOM's intact       Lungs:     Clear to auscultation bilaterally, respirations unlabored  Heart:    Normal heart rate. Normal  rhythm. No murmurs, rubs, or gallops.   MS:   All extremities are intact.   Neurologic:   Awake, alert, oriented x 3. No apparent focal neurological           defect.        Results for orders placed or performed in visit on 04/15/19  POCT HgB A1C  Result Value Ref Range   Hemoglobin A1C 6.3 (A) 4.0 - 5.6 %   Est. average glucose Bld gHb Est-mCnc 134        Assessment & Plan    1. Type 2 diabetes mellitus with other specified complication, without long-term current use of insulin (HCC) Very well controlled. Reduce from '10mg'$  to - glipiZIDE (GLIPIZIDE XL) 5 MG 24 hr tablet; Take 1 tablet (5 mg total) by mouth daily with breakfast.  Dispense: 90 tablet; Refill: 3 - Microalbumin, urine  2. Coronary artery disease of native heart with stable angina pectoris, unspecified vessel or lesion type ([place]) Asymptomatic. Compliant with medication.  Continue aggressive risk factor modification.  His previously cardiologist was in [place] who he has not seen since 2018. He requests referral to local cardiologist.   3. Hx of CABG (chronic thoracic/chest wall pain)  - Ambulatory referral to Cardiology  4. Anxiety He states he has been having more anxiety and would like to go back on sertraline, however he was last prescribed citalopram by his psychiatrist several months ago. It is unclear if he is still taking citalopram. Will have him check his medications at home. We can send in a refill for citalopram if he is out to get by until his next  psychiatry follow up.   5. Severe recurrent major depression without psychotic features ([place])   6. Hyperlipidemia, mixed He is tolerating rosuvastatin well with no adverse effects.   - Comprehensive metabolic panel - Lipid panel (fasting)  7. Chest wall pain Chronic since CABG. Pain relative well controlled.  - oxyCODONE (ROXICODONE) 15 MG immediate release tablet; Take 1 tablet (15 mg total) by mouth every 4 (four) hours as needed for pain.  Dispense: 180 tablet; Refill: 0  8. Chronic midline low back pain without sciatica Had initial evaluation by Dr. Manuella Ghazi last spring but apparently work up was halted due to Covid 19, he would to go ahead and get back in with Dr. Manuella Ghazi to complete work up.  - oxyCODONE (ROXICODONE) 15 MG immediate release tablet; Take 1 tablet (15 mg total) by mouth every 4 (four) hours as needed for pain.  Dispense: 180 tablet; Refill: 0 - Ambulatory referral to Neurology  9. Chronic left SI joint pain  - oxyCODONE (ROXICODONE) 15 MG immediate release tablet; Take 1 tablet (15 mg total) by mouth every 4 (four) hours as needed for pain.  Dispense: 180 tablet; Refill: 0  10. Allergic rhinitis due to pollen, unspecified seasonality He requests refill for - azelastine (OPTIVAR) 0.05 % ophthalmic solution; drop affect eye twice a day  Dispense: 6 mL; Refill: 3 - azelastine (ASTELIN) 0.1 % nasal spray; Place 2 sprays into both nostrils 2 (two) times daily.  Dispense: 30 mL; Refill: 6  11. Opiate use  - Pain Mgt Scrn (14 Drugs), Ur  12. Prostate cancer screening  - PSA   The entirety of the information documented in the History of Present Illness, Review of Systems and Physical Exam were personally obtained by me. Portions of this information were initially documented by Idelle Jo, CMA and reviewed by me for thoroughness and accuracy.  [name], MD  [facility]

## 2019-04-15 NOTE — Patient Instructions (Addendum)
.   Please review the attached list of medications and notify my office if there are any errors.   . Please bring all of your medications to every appointment so we can make sure that our medication list is the same as yours.   . Please contact your eyecare professional to schedule a routine eye exam    

## 2019-04-16 ENCOUNTER — Telehealth: Payer: Self-pay

## 2019-04-16 LAB — COMPREHENSIVE METABOLIC PANEL
ALT: 14 IU/L (ref 0–44)
AST: 18 IU/L (ref 0–40)
Albumin/Globulin Ratio: 2.2 (ref 1.2–2.2)
Albumin: 4.6 g/dL (ref 3.8–4.9)
Alkaline Phosphatase: 54 IU/L (ref 39–117)
BUN/Creatinine Ratio: 10 (ref 9–20)
BUN: 9 mg/dL (ref 6–24)
Bilirubin Total: 0.3 mg/dL (ref 0.0–1.2)
CO2: 26 mmol/L (ref 20–29)
Calcium: 9.7 mg/dL (ref 8.7–10.2)
Chloride: 99 mmol/L (ref 96–106)
Creatinine, Ser: 0.89 mg/dL (ref 0.76–1.27)
GFR calc Af Amer: 111 mL/min/{1.73_m2} (ref >59)
GFR calc non Af Amer: 96 mL/min/{1.73_m2} (ref >59)
Globulin, Total: 2.1 g/dL (ref 1.5–4.5)
Glucose: 99 mg/dL (ref 65–99)
Potassium: 4.3 mmol/L (ref 3.5–5.2)
Sodium: 139 mmol/L (ref 134–144)
Total Protein: 6.7 g/dL (ref 6.0–8.5)

## 2019-04-16 LAB — PAIN MGT SCRN (14 DRUGS), UR
Amphetamine Scrn, Ur: NEGATIVE ng/mL
BARBITURATE SCREEN URINE: NEGATIVE ng/mL
BENZODIAZEPINE SCREEN, URINE: NEGATIVE ng/mL
Buprenorphine, Urine: NEGATIVE ng/mL
CANNABINOIDS UR QL SCN: NEGATIVE ng/mL
Cocaine (Metab) Scrn, Ur: NEGATIVE ng/mL
Creatinine(Crt), U: 39.9 mg/dL (ref 20.0–300.0)
Fentanyl, Urine: NEGATIVE pg/mL
Meperidine Screen, Urine: NEGATIVE ng/mL
Methadone Screen, Urine: NEGATIVE ng/mL
OXYCODONE+OXYMORPHONE UR QL SCN: POSITIVE ng/mL — AB
Opiate Scrn, Ur: POSITIVE ng/mL — AB
Ph of Urine: 7.5 (ref 4.5–8.9)
Phencyclidine Qn, Ur: NEGATIVE ng/mL
Propoxyphene Scrn, Ur: NEGATIVE ng/mL
Tramadol Screen, Urine: NEGATIVE ng/mL

## 2019-04-16 LAB — MICROALBUMIN, URINE: Microalbumin, Urine: <3 ug/mL

## 2019-04-16 LAB — LIPID PANEL
Chol/HDL Ratio: 4.2 ratio (ref 0.0–5.0)
Cholesterol, Total: 172 mg/dL (ref 100–199)
HDL: 41 mg/dL (ref >39)
LDL Chol Calc (NIH): 83 mg/dL (ref 0–99)
Triglycerides: 290 mg/dL — ABNORMAL HIGH (ref 0–149)
VLDL Cholesterol Cal: 48 mg/dL — ABNORMAL HIGH (ref 5–40)

## 2019-04-16 LAB — PSA: Prostate Specific Ag, Serum: <0.1 ng/mL (ref 0.0–4.0)

## 2019-04-16 MED ORDER — CITALOPRAM HYDROBROMIDE 20 MG PO TABS: ORAL_TABLET | ORAL | 1 refills | Status: DC

## 2019-04-16 NOTE — Telephone Encounter (Signed)
-----   Message from Birdie Sons, MD sent at 04/16/2019  7:45 AM EST ----- Labs good. PSA is normal. Cholesterol is well controlled at 172.  Patient was asking about sertraline prescription at his office visit, but he has citalopram (celexa) on his medications. However I don't see a dispense since last summer. Please see if he is still taking the citalopram. If not I can send in a refill to get by until his follow up with his psychiatrist.

## 2019-04-16 NOTE — Telephone Encounter (Signed)
Patient advised. Patient states he is not taking Citalopram and would like a refill sent in to get by until he sees psychiatry.   [facility]

## 2019-04-21 ENCOUNTER — Ambulatory Visit (INDEPENDENT_AMBULATORY_CARE_PROVIDER_SITE_OTHER): Payer: Managed Care, Other (non HMO) | Admitting: Cardiology

## 2019-04-21 ENCOUNTER — Other Ambulatory Visit: Payer: Self-pay

## 2019-04-21 ENCOUNTER — Encounter: Payer: Self-pay | Admitting: Cardiology

## 2019-04-21 VITALS — BP 104/70 | HR 74 | Ht 68.0 in | Wt 199.2 lb

## 2019-04-21 DIAGNOSIS — R0600 Dyspnea, unspecified: Secondary | ICD-10-CM

## 2019-04-21 DIAGNOSIS — R0609 Other forms of dyspnea: Secondary | ICD-10-CM

## 2019-04-21 DIAGNOSIS — I25810 Atherosclerosis of coronary artery bypass graft(s) without angina pectoris: Secondary | ICD-10-CM | POA: Diagnosis not present

## 2019-04-21 DIAGNOSIS — Z951 Presence of aortocoronary bypass graft: Secondary | ICD-10-CM | POA: Diagnosis not present

## 2019-04-21 NOTE — Progress Notes (Signed)
ekg 

## 2019-04-21 NOTE — Patient Instructions (Signed)
Medication Instructions:  Your physician recommends that you continue on your current medications as directed. Please refer to the Current Medication list given to you today.  *If you need a refill on your cardiac medications before your next appointment, please call your pharmacy*  Lab Work: None ordered If you have labs (blood work) drawn today and your tests are completely normal, you will receive your results only by: Marland Kitchen MyChart Message (if you have MyChart) OR . A paper copy in the mail If you have any lab test that is abnormal or we need to change your treatment, we will call you to review the results.  Testing/Procedures: Your physician has requested that you have an echocardiogram. Echocardiography is a painless test that uses sound waves to create images of your heart. It provides your doctor with information about the size and shape of your heart and how well your heart's chambers and valves are working. This procedure takes approximately one hour. There are no restrictions for this procedure.  Your physician has requested that you have a lexiscan myoview. For further information please visit [email or web address]. Please follow instruction sheet, as given.    Follow-Up: At [facility], you and your health needs are our priority.  As part of our continuing mission to provide you with exceptional heart care, we have created designated Provider Care Teams.  These Care Teams include your primary Cardiologist (physician) and Advanced Practice Providers (APPs -  Physician Assistants and Nurse Practitioners) who all work together to provide you with the care you need, when you need it.  Your next appointment:   3 month(s)  The format for your next appointment:   In Person  Provider:    You may see  Dr. Mylo Red- Charlestine Night or one of the following Advanced Practice Providers on your designated Care Team:    Murray Hodgkins, NP  Christell Faith, PA-C  Marrianne Mood, PA-C   Other  Instructions  [place]  Your caregiver has ordered a Stress Test with nuclear imaging. The purpose of this test is to evaluate the blood supply to your heart muscle. This procedure is referred to as a "Non-Invasive Stress Test." This is because other than having an IV started in your vein, nothing is inserted or "invades" your body. Cardiac stress tests are done to find areas of poor blood flow to the heart by determining the extent of coronary artery disease (CAD). Some patients exercise on a treadmill, which naturally increases the blood flow to your heart, while others who are  unable to walk on a treadmill due to physical limitations have a pharmacologic/chemical stress agent called Lexiscan . This medicine will mimic walking on a treadmill by temporarily increasing your coronary blood flow.   Please note: these test may take anywhere between 2-4 hours to complete  PLEASE REPORT TO [place] AT THE FIRST DESK WILL DIRECT YOU WHERE TO GO  Date of Procedure:_____________________________________  Arrival Time for Procedure:______________________________  Instructions regarding medication:   __X_ : Hold diabetes medication morning of procedure   PLEASE NOTIFY THE OFFICE AT LEAST 24 HOURS IN ADVANCE IF YOU ARE UNABLE TO KEEP YOUR APPOINTMENT.  [phone] AND  PLEASE NOTIFY NUCLEAR MEDICINE AT [facility] AT LEAST 24 HOURS IN ADVANCE IF YOU ARE UNABLE TO KEEP YOUR APPOINTMENT. [phone]  How to prepare for your Myoview test:  1. Do not eat or drink after midnight 2. No caffeine for 24 hours prior to test 3. No smoking 24 hours  prior to test. 4. Your medication may be taken with water.  If your doctor stopped a medication because of this test, do not take that medication. 5. Ladies, please do not wear dresses.  Skirts or pants are appropriate. Please wear a short sleeve shirt. 6. No perfume, cologne or lotion. 7. Wear comfortable walking shoes. No  heels!

## 2019-04-21 NOTE — Progress Notes (Signed)
Cardiology Office Note:    Date:  04/21/2019   ID:  Vincent Hall, DOB [birth year], MRN [id number]  PCP:  Birdie Sons, MD  Cardiologist:  Kate Sable, MD  Electrophysiologist:  None   Referring MD: Birdie Sons, MD   Chief Complaint  Patient presents with  . New Patient (Initial Visit)    Hx of CABG; Meds verbally reviewed with patient.   Vincent Hall is a 54 y.o. male who is being seen today for the evaluation of coronary artery disease and dyspnea on exertion at the request of Fisher, Kirstie Peri, MD.   History of Present Illness:    Vincent Hall is a 54 y.o. male with a hx of hypertension, diabetes, STEMI/PCI to LAD, CAD/CABG x 3 in 2015(LIMA to LAD, SVG to PDA, SVG to diagonal 1) who presents to establish care and also complains of dyspnea on exertion.    Patient presented to [facility] in 2015 with chest pain/anterior STEMI was diagnosed.  PCI was performed with bare-metal stent to to the proximal LAD.  Further evaluation by IVUS and FFR showed ostial left main and ostial RCA disease.  He subsequently underwent CABG x3.  Last echocardiogram in 2015 showed normal systolic function, EF 60 to 76%, grade 1 diastolic dysfunction.  He has since been following up with cardiology at [facility] but now wants to establish care locally.  Patient states having dyspnea on exertion x6 months now which has been worsening.  Symptoms typically occur when he goes uphill.  He denies chest pain but endorses palpitations.  He also states having sternal chest pain x2 days now.  He felt fine about a year after his surgery with no pain but had a feeling of his sternum not fusing properly since his surgery in 2015.  When he stretches his arms heart he could feel his sternum moving.  He has not followed up with CT surgery since his surgery.   Past Medical History:  Diagnosis Date  . Anxiety   . Asthma   . Bipolar 2 disorder ([place])   . Coronary artery disease   . Diabetes ([place])   . Fatty liver    . History of MI (myocardial infarction)   . STEMI (ST elevation myocardial infarction) ([place]) 06/22/2013    Past Surgical History:  Procedure Laterality Date  . APPENDECTOMY    . CARDIAC CATHETERIZATION  06/24/2013  . COLONOSCOPY WITH PROPOFOL N/A 09/23/2014   Procedure: COLONOSCOPY WITH PROPOFOL;  Surgeon: Lucilla Lame, MD;  Location: [facility];  Service: Endoscopy;  Laterality: N/A;  . COLONOSCOPY WITH PROPOFOL N/A 10/30/2018   Procedure: COLONOSCOPY WITH PROPOFOL;  Surgeon: Virgel Manifold, MD;  Location: [facility] ENDOSCOPY;  Service: Endoscopy;  Laterality: N/A;  . CORONARY ANGIOPLASTY     stent placement to LAD  . CORONARY ARTERY BYPASS GRAFT  07/31/2013   x 3 vessels  . ESOPHAGOGASTRODUODENOSCOPY (EGD) WITH PROPOFOL N/A 10/30/2018   Procedure: ESOPHAGOGASTRODUODENOSCOPY (EGD) WITH PROPOFOL;  Surgeon: Virgel Manifold, MD;  Location: [facility] ENDOSCOPY;  Service: Endoscopy;  Laterality: N/A;  . FINGER SURGERY    . NASAL SINUS SURGERY    . POLYPECTOMY  09/23/2014   Procedure: POLYPECTOMY;  Surgeon: Lucilla Lame, MD;  Location: [facility];  Service: Endoscopy;;    Current Medications: Current Meds  Medication Sig  . albuterol (PROVENTIL HFA;VENTOLIN HFA) 108 (90 Base) MCG/ACT inhaler Inhale 2 puffs into the lungs every 6 (six) hours as needed for wheezing or shortness of breath.  Marland Kitchen  aspirin 81 MG tablet Take 81 mg by mouth daily.  Marland Kitchen azelastine (ASTELIN) 0.1 % nasal spray Place 2 sprays into both nostrils 2 (two) times daily.  Marland Kitchen azelastine (OPTIVAR) 0.05 % ophthalmic solution drop affect eye twice a day  . Blood Glucose Monitoring Suppl (ONE TOUCH ULTRA 2) w/Device KIT USE AS DIRECTED  . citalopram (CELEXA) 20 MG tablet Take 1/2 daily for 4 days, then increase to 1 tablet daily  . cyclobenzaprine (FLEXERIL) 5 MG tablet TAKE ONE TABLET BY MOUTH THREE TIMES A DAY AS NEEDED FOR MUSCLE SPASMS  . glipiZIDE (GLIPIZIDE XL) 5 MG 24 hr tablet Take 1 tablet (5 mg total) by  mouth daily with breakfast.  . glucose blood (ONE TOUCH ULTRA TEST) test strip Check blood sugar 3 times daily E11.65 (uncontrolled insulin dependent diabetes)  . ketoconazole (NIZORAL) 2 % cream Apply 1 application topically daily.  . mometasone (ELOCON) 0.1 % cream One application daily as needed for rash  . nitroGLYCERIN (NITROSTAT) 0.4 MG SL tablet Place 1 tablet (0.4 mg total) under the tongue every 5 (five) minutes as needed for chest pain.  Marland Kitchen oxyCODONE (ROXICODONE) 15 MG immediate release tablet Take 1 tablet (15 mg total) by mouth every 4 (four) hours as needed for pain.  . pantoprazole (PROTONIX) 20 MG tablet TAKE ONE TABLET BY MOUTH DAILY  . pioglitazone (ACTOS) 15 MG tablet TAKE ONE TABLET BY MOUTH DAILY  . Plecanatide (TRULANCE) 3 MG TABS Take 3 mg by mouth daily.  . rosuvastatin (CRESTOR) 40 MG tablet TAKE ONE TABLET BY MOUTH DAILY  . Semaglutide,0.25 or 0.5MG/DOS, (OZEMPIC, 0.25 OR 0.5 MG/DOSE,) 2 MG/1.5ML SOPN Inject 0.5 mg into the skin once a week.  Marland Kitchen tiZANidine (ZANAFLEX) 4 MG capsule Take 1 capsule (4 mg total) by mouth 2 (two) times daily as needed for muscle spasms.  . traZODone (DESYREL) 100 MG tablet Take 2 tablets (200 mg total) by mouth at bedtime.     Allergies:   Montelukast, Depakote [divalproex sodium], Invokana [canagliflozin], Atorvastatin, and Singulair [montelukast sodium]   Social History   Socioeconomic History  . Marital status: Married    Spouse name: clementin  . Number of children: 2  . Years of education: HS Grad  . Highest education level: Associate degree: occupational, Hotel manager, or vocational program  Occupational History  . Occupation: Employeed  Tobacco Use  . Smoking status: Former Smoker    Packs/day: 1.50    Years: 30.00    Pack years: 45.00    Types: Cigarettes    Quit date: 08/08/2013    Years since quitting: 5.7  . Smokeless tobacco: Never Used  Substance and Sexual Activity  . Alcohol use: No  . Drug use: Never  . Sexual  activity: Not Currently  Other Topics Concern  . Not on file  Social History Narrative  . Not on file   Social Determinants of Health   Financial Resource Strain:   . Difficulty of Paying Living Expenses: Not on file  Food Insecurity:   . Worried About Charity fundraiser in the Last Year: Not on file  . Ran Out of Food in the Last Year: Not on file  Transportation Needs:   . Lack of Transportation (Medical): Not on file  . Lack of Transportation (Non-Medical): Not on file  Physical Activity:   . Days of Exercise per Week: Not on file  . Minutes of Exercise per Session: Not on file  Stress:   . Feeling of Stress : Not  on file  Social Connections:   . Frequency of Communication with Friends and Family: Not on file  . Frequency of Social Gatherings with Friends and Family: Not on file  . Attends Religious Services: Not on file  . Active Member of Clubs or Organizations: Not on file  . Attends Archivist Meetings: Not on file  . Marital Status: Not on file     Family History: The patient's family history includes Diabetes in his father; Heart attack in his father; Heart disease in his father; Hyperlipidemia in his father and mother.  ROS:   Please see the history of present illness.     All other systems reviewed and are negative.  EKGs/Labs/Other Studies Reviewed:    The following studies were reviewed today: TTE 22-Jun-2013 Interpretation:  Clinical Diagnoses and Echocardiographic Findings    Normal left ventricular ejection fraction (01-65%)   Diastolic left ventricular dysfunction   Mitral regurgitation (trivial)   Normal right ventricular contractile performance   Tricuspid regurgitation (trivial)  Descriptive Comments  Left Ventricle   The left ventricle is normal in size with normal wall thickness and   normal contraction.   The visually estimated left ventricular ejection fraction is 60-65%.   Diastolic  transmitral flow profile and mitral annular tissue Doppler   signal characteristic of impaired left ventricular relaxation. Mitral Valve   The mitral valve is structurally normal.   There is trivial mitral regurgitation by color flow and continuous   wave Doppler examinations. Left Atrium   The left atrium is normal in size. Aortic Valve   The aortic valve is trileaflet with normal excursion.   There is no echocardiographic evidence of aortic regurgitation by   color flow or continuous wave Doppler examinations.   There is no evidence of hemodynamically important aortic transvalvular   gradient; the maximal instantaneous left ventricular outflow velocity   is 1.0 m/s. Aorta   The ascending thoracic aorta is normal in diameter at the level of the   left ventricular outflow tract and sinuses of Valsalva; the aorta is   suboptimally imaged above the sinotubular junction and at the   transverse arch. Pulmonary Artery   The pulmonary artery is suboptimally imaged. Pulmonic Valve   The pulmonary valve is suboptimally imaged.   There is no detectable pulmonary regurgitation by color flow Doppler   imaging.   There is no evidence of hemodynamically important pulmonary   transvalvular gradient;  the maximal instantaneous right ventricular   outflow velocity is approximately 1.0 m/s. Right Ventricle   There is normal right ventricular chamber size and contraction. Tricuspid Valve   The tricuspid valve is structurally normal.   There is trivial tricuspid regurgitation by color flow and continuous   wave Doppler imaging.   The signal is of suboptimal technical quality; right ventricular and   pulmonary arterial systolic pressure cannot be accurately estimated   from this examination. Right Atrium   The right atrium is normal in size. Inferior  Vena Cava   The inferior vena cava is normal in size with physiological phasic   respiratory change characteristic of normal central venous and right   atrial pressures; the estimated central venous and right atrial   pressures are 5-10 mm Hg. Pericardium   There is no evidence of pericardial effusion.  EKG:  EKG is  ordered today.  The ekg ordered today demonstrates normal sinus rhythm, poor R wave progression in anterior leads.  Recent Labs: 09/24/2018: Hemoglobin 15.1; Platelets 219 04/15/2019:  ALT 14; BUN 9; Creatinine, Ser 0.89; Potassium 4.3; Sodium 139  Recent Lipid Panel    Component Value Date/Time   CHOL 172 04/15/2019 1507   TRIG 290 (H) 04/15/2019 1507   HDL 41 04/15/2019 1507   CHOLHDL 4.2 04/15/2019 1507   CHOLHDL 2.7 01/08/2018 1159   VLDL 22 01/08/2018 1159   LDLCALC 83 04/15/2019 1507   LDLCALC 124 (H) 01/31/2017 1230    Physical Exam:    VS:  BP 104/70 (BP Location: Right Arm, Patient Position: Sitting, Cuff Size: Normal)   Pulse 74   Ht '5\' 8"'  (1.727 m)   Wt 199 lb 4 oz (90.4 kg)   SpO2 99%   BMI 30.30 kg/m     Wt Readings from Last 3 Encounters:  04/21/19 199 lb 4 oz (90.4 kg)  04/15/19 203 lb (92.1 kg)  11/11/18 191 lb (86.6 kg)     GEN:  Well nourished, well developed in no acute distress HEENT: Normal NECK: No JVD; No carotid bruits LYMPHATICS: No lymphadenopathy CARDIAC: RRR, no murmurs, rubs, gallops RESPIRATORY:  Clear to auscultation without rales, wheezing or rhonchi  ABDOMEN: Soft, non-tender, non-distended MUSCULOSKELETAL:  No edema; No deformity  SKIN: Warm and dry NEUROLOGIC:  Alert and oriented x 3 PSYCHIATRIC:  Normal affect   ASSESSMENT:    1. DOE (dyspnea on exertion)   2. Hx of CABG   3. Coronary artery disease involving coronary bypass graft of native heart without angina pectoris    PLAN:    In order of problems listed above:  1. Get echocardiogram, get Lexiscan myocardial perfusion imaging  stress test. 2. Patient with sternal pain.  Recommended he follows-up with CT surgery for input.  He denies fevers.  Consider getting CT/MRI chest to rule out osteo if symptoms persist. 3. Continue aspirin, Crestor, Toprol-XL.  Follow-up in 3 months  This note was generated in part or whole with voice recognition software. Voice recognition is usually quite accurate but there are transcription errors that can and very often do occur. I apologize for any typographical errors that were not detected and corrected.  Medication Adjustments/Labs and Tests Ordered: Current medicines are reviewed at length with the patient today.  Concerns regarding medicines are outlined above.  Orders Placed This Encounter  Procedures  . NM Myocar Multi W/Spect W/Wall Motion / EF  . EKG 12-Lead  . ECHOCARDIOGRAM COMPLETE   No orders of the defined types were placed in this encounter.   Patient Instructions  Medication Instructions:  Your physician recommends that you continue on your current medications as directed. Please refer to the Current Medication list given to you today.  *If you need a refill on your cardiac medications before your next appointment, please call your pharmacy*  Lab Work: None ordered If you have labs (blood work) drawn today and your tests are completely normal, you will receive your results only by: Marland Kitchen MyChart Message (if you have MyChart) OR . A paper copy in the mail If you have any lab test that is abnormal or we need to change your treatment, we will call you to review the results.  Testing/Procedures: Your physician has requested that you have an echocardiogram. Echocardiography is a painless test that uses sound waves to create images of your heart. It provides your doctor with information about the size and shape of your heart and how well your heart's chambers and valves are working. This procedure takes approximately one hour. There are no restrictions for this  procedure.  Your physician has requested that you have a lexiscan myoview. For further information please visit [email or web address]. Please follow instruction sheet, as given.    Follow-Up: At [facility], you and your health needs are our priority.  As part of our continuing mission to provide you with exceptional heart care, we have created designated Provider Care Teams.  These Care Teams include your primary Cardiologist (physician) and Advanced Practice Providers (APPs -  Physician Assistants and Nurse Practitioners) who all work together to provide you with the care you need, when you need it.  Your next appointment:   3 month(s)  The format for your next appointment:   In Person  Provider:    You may see  Dr. Mylo Red- Charlestine Night or one of the following Advanced Practice Providers on your designated Care Team:    Murray Hodgkins, NP  Christell Faith, PA-C  Marrianne Mood, PA-C   Other Instructions  [place]  Your caregiver has ordered a Stress Test with nuclear imaging. The purpose of this test is to evaluate the blood supply to your heart muscle. This procedure is referred to as a "Non-Invasive Stress Test." This is because other than having an IV started in your vein, nothing is inserted or "invades" your body. Cardiac stress tests are done to find areas of poor blood flow to the heart by determining the extent of coronary artery disease (CAD). Some patients exercise on a treadmill, which naturally increases the blood flow to your heart, while others who are  unable to walk on a treadmill due to physical limitations have a pharmacologic/chemical stress agent called Lexiscan . This medicine will mimic walking on a treadmill by temporarily increasing your coronary blood flow.   Please note: these test may take anywhere between 2-4 hours to complete  PLEASE REPORT TO [facility] AT THE FIRST DESK WILL DIRECT YOU WHERE TO GO  Date of  Procedure:_____________________________________  Arrival Time for Procedure:______________________________  Instructions regarding medication:   __X_ : Hold diabetes medication morning of procedure   PLEASE NOTIFY THE OFFICE AT LEAST 24 HOURS IN ADVANCE IF YOU ARE UNABLE TO KEEP YOUR APPOINTMENT.  [phone] AND  PLEASE NOTIFY NUCLEAR MEDICINE AT [facility] AT LEAST 24 HOURS IN ADVANCE IF YOU ARE UNABLE TO KEEP YOUR APPOINTMENT. [phone]  How to prepare for your Myoview test:  1. Do not eat or drink after midnight 2. No caffeine for 24 hours prior to test 3. No smoking 24 hours prior to test. 4. Your medication may be taken with water.  If your doctor stopped a medication because of this test, do not take that medication. 5. Ladies, please do not wear dresses.  Skirts or pants are appropriate. Please wear a short sleeve shirt. 6. No perfume, cologne or lotion. 7. Wear comfortable walking shoes. No heels!               Signed, Kate Sable, MD  04/21/2019 4:52 PM    [facility]

## 2019-04-29 ENCOUNTER — Encounter
Admission: RE | Admit: 2019-04-29 | Discharge: 2019-04-29 | Disposition: A | Discharge status: Home / Self Care | Payer: Managed Care, Other (non HMO) | Source: Ambulatory Visit | Attending: Cardiology | Admitting: Cardiology

## 2019-04-29 ENCOUNTER — Other Ambulatory Visit: Payer: Self-pay

## 2019-04-29 DIAGNOSIS — Z951 Presence of aortocoronary bypass graft: Secondary | ICD-10-CM | POA: Diagnosis present

## 2019-04-29 DIAGNOSIS — I25810 Atherosclerosis of coronary artery bypass graft(s) without angina pectoris: Secondary | ICD-10-CM | POA: Diagnosis present

## 2019-04-29 DIAGNOSIS — R0609 Other forms of dyspnea: Secondary | ICD-10-CM

## 2019-04-29 DIAGNOSIS — R0600 Dyspnea, unspecified: Secondary | ICD-10-CM | POA: Insufficient documentation

## 2019-04-29 LAB — NM MYOCAR MULTI W/SPECT W/WALL MOTION / EF
Estimated workload: 1 METS
Exercise duration (min): 0 min
Exercise duration (sec): 0 s
LV dias vol: 85 mL (ref 62–150)
LV sys vol: 42 mL
MPHR: 165 {beats}/min
Peak HR: 107 {beats}/min
Percent HR: 64 %
Rest HR: 78 {beats}/min
TID: 1.04

## 2019-04-29 MED ORDER — TECHNETIUM TC 99M TETROFOSMIN IV KIT
30.0000 | PACK | Freq: Once | INTRAVENOUS | Status: AC | PRN
  Administered 2019-04-29: 30.942 via INTRAVENOUS

## 2019-04-29 MED ORDER — REGADENOSON 0.4 MG/5ML IV SOLN
0.4000 mg | Freq: Once | INTRAVENOUS | Status: AC
  Administered 2019-04-29: 0.4 mg via INTRAVENOUS
  Filled 2019-04-29: qty 5

## 2019-04-29 MED ORDER — TECHNETIUM TC 99M TETROFOSMIN IV KIT
10.0000 | PACK | Freq: Once | INTRAVENOUS | Status: AC | PRN
  Administered 2019-04-29: 10.8 via INTRAVENOUS

## 2019-05-08 ENCOUNTER — Other Ambulatory Visit: Payer: Managed Care, Other (non HMO)

## 2019-05-12 ENCOUNTER — Other Ambulatory Visit: Payer: Self-pay | Admitting: Family Medicine

## 2019-05-12 DIAGNOSIS — M545 Low back pain, unspecified: Secondary | ICD-10-CM

## 2019-05-12 DIAGNOSIS — M533 Sacrococcygeal disorders, not elsewhere classified: Secondary | ICD-10-CM

## 2019-05-12 DIAGNOSIS — G8929 Other chronic pain: Secondary | ICD-10-CM

## 2019-05-12 DIAGNOSIS — R0789 Other chest pain: Secondary | ICD-10-CM

## 2019-05-12 MED ORDER — OXYCODONE HCL 15 MG PO TABS: 15.0000 mg | ORAL_TABLET | ORAL | 0 refills | Status: DC | PRN

## 2019-05-12 NOTE — Telephone Encounter (Signed)
Requested medication (s) are due for refill today: yes  Requested medication (s) are on the active medication list: yes  Last refill:  04/15/19  Future visit scheduled: no   Notes to clinic:  not delegated    Requested Prescriptions  Pending Prescriptions Disp Refills   oxyCODONE (ROXICODONE) 15 MG immediate release tablet 180 tablet 0    Sig: Take 1 tablet (15 mg total) by mouth every 4 (four) hours as needed for pain.      Not Delegated - Analgesics:  Opioid Agonists Failed - 05/12/2019 10:18 AM      Failed - This refill cannot be delegated      Failed - Urine Drug Screen completed in last 360 days.      Passed - Valid encounter within last 6 months    Recent Outpatient Visits           3 weeks ago Type 2 diabetes mellitus with other specified complication, without long-term current use of insulin [facility])   [facility] Birdie Sons, MD   6 months ago Chronic midline low back pain without sciatica   [facility] Birdie Sons, MD   7 months ago [place] [facility] [place], [place]   9 months ago [facility] Birdie Sons, MD   10 months ago Uncontrolled type 2 diabetes mellitus with hyperglycemia [facility])   [facility] Birdie Sons, MD       Future Appointments             In 2 months Agbor-Etang, Aaron Edelman, MD [facility], [facility]

## 2019-05-12 NOTE — Telephone Encounter (Signed)
Copied from [place] [id number]. Topic: Quick Communication - Rx Refill/Question >> May 12, 2019 10:12 AM Leward Quan A wrote: Medication: oxyCODONE (ROXICODONE) 15 MG immediate release tablet  Has the patient contacted their pharmacy? Yes.   (Agent: If no, request that the patient contact the pharmacy for the refill.) (Agent: If yes, when and what did the pharmacy advise?)  Preferred Pharmacy (with phone number or street name): [place], [place]  Phone:  [phone] Fax:  [phone]     Agent: Please be advised that RX refills may take up to 3 business days. We ask that you follow-up with your pharmacy.

## 2019-05-15 ENCOUNTER — Telehealth: Payer: Self-pay | Admitting: *Deleted

## 2019-05-15 DIAGNOSIS — Z87891 Personal history of nicotine dependence: Secondary | ICD-10-CM

## 2019-05-15 NOTE — Telephone Encounter (Signed)
Received referral for initial lung cancer screening scan. Contacted patient and obtained smoking history,(former, quit 08/08/13, 45 pack year) as well as answering questions related to screening process. Patient denies signs of lung cancer such as weight loss or hemoptysis. Patient denies comorbidity that would prevent curative treatment if lung cancer were found. Patient is scheduled for shared decision making visit and CT scan on 05/21/19 at 1045am.

## 2019-05-17 ENCOUNTER — Other Ambulatory Visit: Payer: Self-pay | Admitting: Gastroenterology

## 2019-05-21 ENCOUNTER — Ambulatory Visit
Admission: RE | Admit: 2019-05-21 | Discharge: 2019-05-21 | Disposition: A | Discharge status: Home / Self Care | Payer: Managed Care, Other (non HMO) | Source: Ambulatory Visit | Attending: Oncology | Admitting: Oncology

## 2019-05-21 ENCOUNTER — Other Ambulatory Visit: Payer: Self-pay

## 2019-05-21 ENCOUNTER — Inpatient Hospital Stay: Payer: Managed Care, Other (non HMO) | Attending: Oncology | Admitting: Oncology

## 2019-05-21 DIAGNOSIS — Z87891 Personal history of nicotine dependence: Secondary | ICD-10-CM

## 2019-05-21 NOTE — Progress Notes (Signed)
Virtual Visit via Video Note  I connected with Vincent Hall on 05/21/19 at 10:45 AM EST by a video enabled telemedicine application and verified that I am speaking with the correct person using two identifiers.  Location: Patient: [facility] Provider: Office   I discussed the limitations of evaluation and management by telemedicine and the availability of in person appointments. The patient expressed understanding and agreed to proceed.  I discussed the assessment and treatment plan with the patient. The patient was provided an opportunity to ask questions and all were answered. The patient agreed with the plan and demonstrated an understanding of the instructions.   The patient was advised to call back or seek an in-person evaluation if the symptoms worsen or if the condition fails to improve as anticipated.   In accordance with CMS guidelines, patient has met eligibility criteria including age, absence of signs or symptoms of lung cancer.  Social History   Tobacco Use  . Smoking status: Former Smoker    Packs/day: 1.50    Years: 30.00    Pack years: 45.00    Types: Cigarettes    Quit date: 08/08/2013    Years since quitting: 5.7  . Smokeless tobacco: Never Used  Substance Use Topics  . Alcohol use: No  . Drug use: Never      A shared decision-making session was conducted prior to the performance of CT scan. This includes one or more decision aids, includes benefits and harms of screening, follow-up diagnostic testing, over-diagnosis, false positive rate, and total radiation exposure.   Counseling on the importance of adherence to annual lung cancer LDCT screening, impact of co-morbidities, and ability or willingness to undergo diagnosis and treatment is imperative for compliance of the program.   Counseling on the importance of continued smoking cessation for former smokers; the importance of smoking cessation for current smokers, and information about tobacco cessation interventions have  been given to patient including [place] and 1800 quit [place] programs.   Written order for lung cancer screening with LDCT has been given to the patient and any and all questions have been answered to the best of my abilities.    Yearly follow up will be coordinated by Burgess Estelle, Thoracic Navigator.  I provided 15 minutes of face-to-face video visit time during this encounter, and > 50% was spent counseling as documented under my assessment & plan.   Jacquelin Hawking, NP

## 2019-05-24 ENCOUNTER — Other Ambulatory Visit: Payer: Self-pay | Admitting: Family Medicine

## 2019-05-24 DIAGNOSIS — M549 Dorsalgia, unspecified: Secondary | ICD-10-CM

## 2019-05-24 NOTE — Telephone Encounter (Signed)
Requested medication (s) are due for refill today: yes  Requested medication (s) are on the active medication list: yes  Last refill:  03/29/2018  Future visit scheduled: no  Notes to clinic:  medication not delegated to NT to refill   Requested Prescriptions  Pending Prescriptions Disp Refills   cyclobenzaprine (FLEXERIL) 5 MG tablet [Pharmacy Med Name: CYCLOBENZAPRINE 5 MG TABLET] 30 tablet 11    Sig: TAKE ONE TABLET BY MOUTH THREE TIMES A DAY AS NEEDED FOR MUSCLE SPASMS      Not Delegated - Analgesics:  Muscle Relaxants Failed - 05/24/2019 10:51 AM      Failed - This refill cannot be delegated      Passed - Valid encounter within last 6 months    Recent Outpatient Visits           1 month ago Type 2 diabetes mellitus with other specified complication, without long-term current use of insulin [facility])   [facility] Birdie Sons, MD   6 months ago Chronic midline low back pain without sciatica   [facility] Birdie Sons, MD   7 months ago [place] [facility] [place], [place]   9 months ago [facility] Birdie Sons, MD   11 months ago Uncontrolled type 2 diabetes mellitus with hyperglycemia [facility])   [facility] Birdie Sons, MD       Future Appointments             In 1 month Agbor-Etang, Aaron Edelman, MD [facility], [place]

## 2019-05-26 ENCOUNTER — Telehealth: Payer: Self-pay | Admitting: *Deleted

## 2019-05-26 NOTE — Telephone Encounter (Signed)
Notified patient of LDCT lung cancer screening program results with recommendation for 6 month follow up imaging. Also notified of incidental findings noted below and is encouraged to discuss further with PCP who will receive a copy of this note and/or the CT report. Patient verbalizes understanding.   IMPRESSION: Lung-RADS 3, probably benign findings. Short-term follow-up in 6 months is recommended with repeat low-dose chest CT without contrast (please use the following order, "CT CHEST LCS NODULE FOLLOW-UP /O CM").  Aortic Atherosclerosis (ICD10-I70.0) and Emphysema (ICD10-J43.9).

## 2019-05-27 ENCOUNTER — Other Ambulatory Visit: Payer: Managed Care, Other (non HMO)

## 2019-05-28 ENCOUNTER — Other Ambulatory Visit: Payer: Managed Care, Other (non HMO)

## 2019-05-28 ENCOUNTER — Encounter: Payer: Self-pay | Admitting: *Deleted

## 2019-06-10 ENCOUNTER — Other Ambulatory Visit: Payer: Self-pay | Admitting: Family Medicine

## 2019-06-10 DIAGNOSIS — R0789 Other chest pain: Secondary | ICD-10-CM

## 2019-06-10 DIAGNOSIS — G8929 Other chronic pain: Secondary | ICD-10-CM

## 2019-06-10 DIAGNOSIS — M533 Sacrococcygeal disorders, not elsewhere classified: Secondary | ICD-10-CM

## 2019-06-10 MED ORDER — OXYCODONE HCL 15 MG PO TABS: 15.0000 mg | ORAL_TABLET | ORAL | 0 refills | Status: DC | PRN

## 2019-06-10 NOTE — Telephone Encounter (Signed)
Medication Refill - Medication: oxyCODONE (ROXICODONE) 15 MG immediate release tablet    Preferred Pharmacy (with phone number or street name):  [place], [place] Phone:  [phone]  Fax:  [phone]       Agent: Please be advised that RX refills may take up to 3 business days. We ask that you follow-up with your pharmacy.

## 2019-06-10 NOTE — Telephone Encounter (Signed)
Requested medication (s) are due for refill today: yes  Requested medication (s) are on the active medication list: yes  Last refill:  05/12/19  Future visit scheduled: yes  Notes to clinic:  not delegated    Requested Prescriptions  Pending Prescriptions Disp Refills   oxyCODONE (ROXICODONE) 15 MG immediate release tablet 180 tablet 0    Sig: Take 1 tablet (15 mg total) by mouth every 4 (four) hours as needed for pain.      Not Delegated - Analgesics:  Opioid Agonists Failed - 06/10/2019  9:12 AM      Failed - This refill cannot be delegated      Failed - Urine Drug Screen completed in last 360 days.      Passed - Valid encounter within last 6 months    Recent Outpatient Visits           1 month ago Type 2 diabetes mellitus with other specified complication, without long-term current use of insulin [facility])   [facility] Birdie Sons, MD   7 months ago Chronic midline low back pain without sciatica   [facility] Birdie Sons, MD   8 months ago [place], [place], [place]   10 months ago [facility] Birdie Sons, MD   11 months ago Uncontrolled type 2 diabetes mellitus with hyperglycemia [facility])   [facility] Birdie Sons, MD       Future Appointments             In 1 month Agbor-Etang, Aaron Edelman, MD [facility], [facility]

## 2019-07-03 ENCOUNTER — Other Ambulatory Visit: Payer: Self-pay | Admitting: Family Medicine

## 2019-07-03 DIAGNOSIS — M533 Sacrococcygeal disorders, not elsewhere classified: Secondary | ICD-10-CM

## 2019-07-03 DIAGNOSIS — R0789 Other chest pain: Secondary | ICD-10-CM

## 2019-07-03 DIAGNOSIS — G8929 Other chronic pain: Secondary | ICD-10-CM

## 2019-07-03 DIAGNOSIS — M545 Low back pain, unspecified: Secondary | ICD-10-CM

## 2019-07-03 NOTE — Telephone Encounter (Signed)
Requested medication (s) are due for refill today - if to continue  Requested medication (s) are on the active medication list -yes  Future visit scheduled -no  Last refill: 06/10/19  Notes to clinic: Patient request refill non delegated Rx  Requested Prescriptions  Pending Prescriptions Disp Refills   oxyCODONE (ROXICODONE) 15 MG immediate release tablet 180 tablet 0    Sig: Take 1 tablet (15 mg total) by mouth every 4 (four) hours as needed for pain.      Not Delegated - Analgesics:  Opioid Agonists Failed - 07/03/2019 12:47 PM      Failed - This refill cannot be delegated      Failed - Urine Drug Screen completed in last 360 days.      Passed - Valid encounter within last 6 months    Recent Outpatient Visits           2 months ago Type 2 diabetes mellitus with other specified complication, without long-term current use of insulin [facility])   [facility] Birdie Sons, MD   7 months ago Chronic midline low back pain without sciatica   [facility] Birdie Sons, MD   9 months ago [place] Carles Collet M, [place]   11 months ago [facility] Birdie Sons, MD   1 year ago Uncontrolled type 2 diabetes mellitus with hyperglycemia [facility])   [facility] Birdie Sons, MD       Future Appointments             In 2 weeks Agbor-Etang, Aaron Edelman, MD [facility], [facility]                Requested Prescriptions  Pending Prescriptions Disp Refills   oxyCODONE (ROXICODONE) 15 MG immediate release tablet 180 tablet 0    Sig: Take 1 tablet (15 mg total) by mouth every 4 (four) hours as needed for pain.      Not Delegated - Analgesics:  Opioid Agonists Failed - 07/03/2019 12:47 PM      Failed - This refill cannot be delegated      Failed - Urine Drug Screen completed in last 360 days.      Passed - Valid encounter within last 6 months    Recent Outpatient  Visits           2 months ago Type 2 diabetes mellitus with other specified complication, without long-term current use of insulin [facility])   [facility] Birdie Sons, MD   7 months ago Chronic midline low back pain without sciatica   [facility] Birdie Sons, MD   9 months ago [place], [place]   11 months ago [facility] Birdie Sons, MD   1 year ago Uncontrolled type 2 diabetes mellitus with hyperglycemia [facility])   [facility] Birdie Sons, MD       Future Appointments             In 2 weeks Agbor-Etang, Aaron Edelman, MD [facility], [facility]

## 2019-07-03 NOTE — Telephone Encounter (Signed)
Copied from [place] [id number]. Topic: Quick Communication - Rx Refill/Question >> Jul 03, 2019 12:45 PM Leward Quan A wrote: Medication: oxyCODONE (ROXICODONE) 15 MG immediate release tablet   Has the patient contacted their pharmacy? Yes.   (Agent: If no, request that the patient contact the pharmacy for the refill.) (Agent: If yes, when and what did the pharmacy advise?)  Preferred Pharmacy (with phone number or street name): [place], [place]  Phone:  [phone] Fax:  [phone]     Agent: Please be advised that RX refills may take up to 3 business days. We ask that you follow-up with your pharmacy.

## 2019-07-04 NOTE — Telephone Encounter (Signed)
30 day supply dispensed 06-10-2019

## 2019-07-07 MED ORDER — OXYCODONE HCL 15 MG PO TABS: 15.0000 mg | ORAL_TABLET | ORAL | 0 refills | Status: DC | PRN

## 2019-07-07 NOTE — Telephone Encounter (Signed)
Pt called asking if the refill on his oxycodone had been filled  [employer]  CB# [id number]

## 2019-07-08 ENCOUNTER — Other Ambulatory Visit: Payer: Managed Care, Other (non HMO)

## 2019-07-09 ENCOUNTER — Other Ambulatory Visit: Payer: Self-pay | Admitting: Family Medicine

## 2019-07-09 DIAGNOSIS — G4700 Insomnia, unspecified: Secondary | ICD-10-CM

## 2019-07-21 ENCOUNTER — Ambulatory Visit (INDEPENDENT_AMBULATORY_CARE_PROVIDER_SITE_OTHER): Payer: Managed Care, Other (non HMO) | Admitting: Cardiology

## 2019-07-21 ENCOUNTER — Encounter: Payer: Self-pay | Admitting: Cardiology

## 2019-07-21 ENCOUNTER — Other Ambulatory Visit: Payer: Self-pay

## 2019-07-21 VITALS — BP 126/66 | HR 68 | Ht 68.0 in | Wt 203.0 lb

## 2019-07-21 DIAGNOSIS — R0600 Dyspnea, unspecified: Secondary | ICD-10-CM

## 2019-07-21 DIAGNOSIS — Z951 Presence of aortocoronary bypass graft: Secondary | ICD-10-CM

## 2019-07-21 DIAGNOSIS — I25810 Atherosclerosis of coronary artery bypass graft(s) without angina pectoris: Secondary | ICD-10-CM

## 2019-07-21 DIAGNOSIS — R0609 Other forms of dyspnea: Secondary | ICD-10-CM

## 2019-07-21 NOTE — Progress Notes (Signed)
Cardiology Office Note:    Date:  07/21/2019   ID:  Vincent Hall, DOB 03-24-1966, MRN [id number]  PCP:  Vincent Sons, MD  Cardiologist:  Vincent Sable, MD  Electrophysiologist:  None   Referring MD: Vincent Sons, MD   Chief Complaint  Patient presents with  . Other    3 month follow up. patient c/o some SOB. Meds reviewed verbally with patient.     History of Present Illness:    Vincent Hall is a 54 y.o. male with a hx of hypertension, diabetes, STEMI/PCI to LAD, CAD/CABG x 3 in 2015(LIMA to LAD, SVG to PDA, SVG to diagonal 1) who presents for follow-up.  He was last seen to establish care and also worsening dyspnea on exertion for about 6 months.  Patient also complained of some sternal chest discomfort and feeling of his sternum not fusing properly since his surgery in 2015.  He could feel his sternum moving when he stretches his arms.  He was advised to follow-up with CT surgery for input.  Echocardiogram and Lexiscan stress test was ordered.  Echo is scheduled later on this month.  Historical notes Patient presented to [facility] with chest pain/anterior STEMI was diagnosed.  PCI was performed with bare-metal stent to to the proximal LAD.  Further evaluation by IVUS and FFR showed ostial left main and ostial RCA disease.  He subsequently underwent CABG x3.  Last echocardiogram in 2015 showed normal systolic function, EF 60 to 17%, grade 1 diastolic dysfunction.   Past Medical History:  Diagnosis Date  . Anxiety   . Asthma   . Bipolar 2 disorder ([place])   . Coronary artery disease   . Diabetes ([place])   . Fatty liver   . History of MI (myocardial infarction)   . STEMI (ST elevation myocardial infarction) ([place]) 06/22/2013    Past Surgical History:  Procedure Laterality Date  . APPENDECTOMY    . CARDIAC CATHETERIZATION  06/24/2013  . COLONOSCOPY WITH PROPOFOL N/A 09/23/2014   Procedure: COLONOSCOPY WITH PROPOFOL;  Surgeon: Lucilla Lame, MD;  Location:  [facility];  Service: Endoscopy;  Laterality: N/A;  . COLONOSCOPY WITH PROPOFOL N/A 10/30/2018   Procedure: COLONOSCOPY WITH PROPOFOL;  Surgeon: Virgel Manifold, MD;  Location: [facility];  Service: Endoscopy;  Laterality: N/A;  . CORONARY ANGIOPLASTY     stent placement to LAD  . CORONARY ARTERY BYPASS GRAFT  07/31/2013   x 3 vessels  . ESOPHAGOGASTRODUODENOSCOPY (EGD) WITH PROPOFOL N/A 10/30/2018   Procedure: ESOPHAGOGASTRODUODENOSCOPY (EGD) WITH PROPOFOL;  Surgeon: Virgel Manifold, MD;  Location: [facility];  Service: Endoscopy;  Laterality: N/A;  . FINGER SURGERY    . NASAL SINUS SURGERY    . POLYPECTOMY  09/23/2014   Procedure: POLYPECTOMY;  Surgeon: Lucilla Lame, MD;  Location: [facility];  Service: Endoscopy;;    Current Medications: Current Meds  Medication Sig  . albuterol (PROVENTIL HFA;VENTOLIN HFA) 108 (90 Base) MCG/ACT inhaler Inhale 2 puffs into the lungs every 6 (six) hours as needed for wheezing or shortness of breath.  Marland Kitchen aspirin 81 MG tablet Take 81 mg by mouth daily.  Marland Kitchen azelastine (ASTELIN) 0.1 % nasal spray Place 2 sprays into both nostrils 2 (two) times daily.  Marland Kitchen azelastine (OPTIVAR) 0.05 % ophthalmic solution drop affect eye twice a day  . Blood Glucose Monitoring Suppl (ONE TOUCH ULTRA 2) w/Device KIT USE AS DIRECTED  . citalopram (CELEXA) 20 MG tablet Take 1/2 daily for 4 days,  then increase to 1 tablet daily  . cyclobenzaprine (FLEXERIL) 5 MG tablet TAKE ONE TABLET BY MOUTH THREE TIMES A DAY AS NEEDED FOR MUSCLE SPASMS  . glipiZIDE (GLIPIZIDE XL) 5 MG 24 hr tablet Take 1 tablet (5 mg total) by mouth daily with breakfast.  . glucose blood (ONE TOUCH ULTRA TEST) test strip Check blood sugar 3 times daily E11.65 (uncontrolled insulin dependent diabetes)  . ketoconazole (NIZORAL) 2 % cream Apply 1 application topically daily.  . metFORMIN (GLUCOPHAGE) 500 MG tablet Take 1 tablet (500 mg total) by mouth 2 (two) times daily with a meal.  .  mometasone (ELOCON) 0.1 % cream One application daily as needed for rash  . nitroGLYCERIN (NITROSTAT) 0.4 MG SL tablet Place 1 tablet (0.4 mg total) under the tongue every 5 (five) minutes as needed for chest pain.  Marland Kitchen oxyCODONE (ROXICODONE) 15 MG immediate release tablet Take 1 tablet (15 mg total) by mouth every 4 (four) hours as needed for pain.  . pantoprazole (PROTONIX) 20 MG tablet TAKE ONE TABLET BY MOUTH DAILY  . pioglitazone (ACTOS) 15 MG tablet TAKE ONE TABLET BY MOUTH DAILY  . Plecanatide (TRULANCE) 3 MG TABS Take 3 mg by mouth daily.  . rosuvastatin (CRESTOR) 40 MG tablet TAKE ONE TABLET BY MOUTH DAILY  . Semaglutide,0.25 or 0.5MG/DOS, (OZEMPIC, 0.25 OR 0.5 MG/DOSE,) 2 MG/1.5ML SOPN Inject 0.5 mg into the skin once a week.  Marland Kitchen tiZANidine (ZANAFLEX) 4 MG capsule Take 1 capsule (4 mg total) by mouth 2 (two) times daily as needed for muscle spasms.  . traZODone (DESYREL) 100 MG tablet TAKE TWO TABLETS BY MOUTH EVERY NIGHT AT BEDTIME     Allergies:   Montelukast, Depakote [divalproex sodium], Invokana [canagliflozin], Atorvastatin, and Singulair [montelukast sodium]   Social History   Socioeconomic History  . Marital status: Married    Spouse name: Vincent Hall  . Number of children: 2  . Years of education: HS Grad  . Highest education level: Associate degree: occupational, Hotel manager, or vocational program  Occupational History  . Occupation: Employeed  Tobacco Use  . Smoking status: Former Smoker    Packs/day: 1.50    Years: 30.00    Pack years: 45.00    Types: Cigarettes    Quit date: 08/08/2013    Years since quitting: 5.9  . Smokeless tobacco: Never Used  Substance and Sexual Activity  . Alcohol use: No  . Drug use: Never  . Sexual activity: Not Currently  Other Topics Concern  . Not on file  Social History Narrative  . Not on file   Social Determinants of Health   Financial Resource Strain:   . Difficulty of Paying Living Expenses:   Food Insecurity:   . Worried  About Charity fundraiser in the Last Year:   . Arboriculturist in the Last Year:   Transportation Needs:   . Film/video editor (Medical):   Marland Kitchen Lack of Transportation (Non-Medical):   Physical Activity:   . Days of Exercise per Week:   . Minutes of Exercise per Session:   Stress:   . Feeling of Stress :   Social Connections:   . Frequency of Communication with Friends and Family:   . Frequency of Social Gatherings with Friends and Family:   . Attends Religious Services:   . Active Member of Clubs or Organizations:   . Attends Archivist Meetings:   Marland Kitchen Marital Status:      Family History: The patient's family history  includes Diabetes in his father; Heart attack in his father; Heart disease in his father; Hyperlipidemia in his father and mother.  ROS:   Please see the history of present illness.     All other systems reviewed and are negative.  EKGs/Labs/Other Studies Reviewed:    The following studies were reviewed today: TTE 05/16/13 Interpretation:  Clinical Diagnoses and Echocardiographic Findings    Normal left ventricular ejection fraction (24-58%)   Diastolic left ventricular dysfunction   Mitral regurgitation (trivial)   Normal right ventricular contractile performance   Tricuspid regurgitation (trivial)  Descriptive Comments  Left Ventricle   The left ventricle is normal in size with normal wall thickness and   normal contraction.   The visually estimated left ventricular ejection fraction is 60-65%.   Diastolic transmitral flow profile and mitral annular tissue Doppler   signal characteristic of impaired left ventricular relaxation. Mitral Valve   The mitral valve is structurally normal.   There is trivial mitral regurgitation by color flow and continuous   wave Doppler examinations. Left Atrium   The left atrium is normal in size. Aortic Valve   The aortic valve is trileaflet  with normal excursion.   There is no echocardiographic evidence of aortic regurgitation by   color flow or continuous wave Doppler examinations.   There is no evidence of hemodynamically important aortic transvalvular   gradient; the maximal instantaneous left ventricular outflow velocity   is 1.0 m/s. Aorta   The ascending thoracic aorta is normal in diameter at the level of the   left ventricular outflow tract and sinuses of Valsalva; the aorta is   suboptimally imaged above the sinotubular junction and at the   transverse arch. Pulmonary Artery   The pulmonary artery is suboptimally imaged. Pulmonic Valve   The pulmonary valve is suboptimally imaged.   There is no detectable pulmonary regurgitation by color flow Doppler   imaging.   There is no evidence of hemodynamically important pulmonary   transvalvular gradient;  the maximal instantaneous right ventricular   outflow velocity is approximately 1.0 m/s. Right Ventricle   There is normal right ventricular chamber size and contraction. Tricuspid Valve   The tricuspid valve is structurally normal.   There is trivial tricuspid regurgitation by color flow and continuous   wave Doppler imaging.   The signal is of suboptimal technical quality; right ventricular and   pulmonary arterial systolic pressure cannot be accurately estimated   from this examination. Right Atrium   The right atrium is normal in size. Inferior Vena Cava   The inferior vena cava is normal in size with physiological phasic   respiratory change characteristic of normal central venous and right   atrial pressures; the estimated central venous and right atrial   pressures are 5-10 mm Hg. Pericardium   There is no evidence of pericardial effusion.  EKG:  EKG is  ordered today.  The ekg ordered today demonstrates normal  sinus rhythm, nonspecific T wave changes.  Recent Labs: 09/24/2018: Hemoglobin 15.1; Platelets 219 04/15/2019: ALT 14; BUN 9; Creatinine, Ser 0.89; Potassium 4.3; Sodium 139  Recent Lipid Panel    Component Value Date/Time   CHOL 172 04/15/2019 1507   TRIG 290 (H) 04/15/2019 1507   HDL 41 04/15/2019 1507   CHOLHDL 4.2 04/15/2019 1507   CHOLHDL 2.7 01/08/2018 1159   VLDL 22 01/08/2018 1159   LDLCALC 83 04/15/2019 1507   LDLCALC 124 (H) 01/31/2017 1230    Physical Exam:    VS:  BP 126/66 (BP Location: Left Arm, Patient Position: Sitting, Cuff Size: Normal)   Pulse 68   Ht 5' 8" (1.727 m)   Wt 203 lb (92.1 kg)   SpO2 98%   BMI 30.87 kg/m     Wt Readings from Last 3 Encounters:  07/21/19 203 lb (92.1 kg)  05/21/19 200 lb (90.7 kg)  04/21/19 199 lb 4 oz (90.4 kg)     GEN:  Well nourished, well developed in no acute distress HEENT: Normal NECK: No JVD; No carotid bruits LYMPHATICS: No lymphadenopathy CARDIAC: RRR, no murmurs, rubs, gallops RESPIRATORY:  Clear to auscultation without rales, wheezing or rhonchi  ABDOMEN: Soft, non-tender, non-distended MUSCULOSKELETAL:  No edema; No deformity  SKIN: Warm and dry NEUROLOGIC:  Alert and oriented x 3 PSYCHIATRIC:  Normal affect   ASSESSMENT:    1. DOE (dyspnea on exertion)   2. Hx of CABG   3. Coronary artery disease involving coronary bypass graft of native heart without angina pectoris    PLAN:    In order of problems listed above:  1. Patient had dyspnea on exertion worsening over 68-month.  Lexiscan myocardial imaging stress test did not reveal any evidence for ischemia.  LVEF was normal at 51%.  Last echocardiogram in 2015 showed normal systolic function, impaired relaxation.  Repeat transthoracic echocardiogram is pending.  Patient advised to get echo, will call patient with results.  We will schedule an earlier appointment if anything is grossly abnormal. 2. Patient with sternal pain especially with arm movements.   Also follow-up with CT surgery for input.  He denies fevers or any signs of infection. 3. Continue aspirin, Crestor.  Not on beta-blocker due to low normal heart rate.  Follow-up in 6 months  This note was generated in part or whole with voice recognition software. Voice recognition is usually quite accurate but there are transcription errors that can and very often do occur. I apologize for any typographical errors that were not detected and corrected.  Medication Adjustments/Labs and Tests Ordered: Current medicines are reviewed at length with the patient today.  Concerns regarding medicines are outlined above.  Orders Placed This Encounter  Procedures  . EKG 12-Lead   No orders of the defined types were placed in this encounter.   Patient Instructions  Medication Instructions:  Your physician recommends that you continue on your current medications as directed. Please refer to the Current Medication list given to you today.  *If you need a refill on your cardiac medications before your next appointment, please call your pharmacy*   Lab Work: None ordered If you have labs (blood work) drawn today and your tests are completely normal, you will receive your results only by: .Marland KitchenMyChart Message (if you have MyChart) OR . A paper copy in the mail If you have any lab test that is abnormal or we need to change your treatment, we will call you to review the results.   Testing/Procedures: None ordered   Follow-Up: [facility] and your health needs are our priority.  As part of our continuing mission to provide you with exceptional heart care, we have created designated Provider Care Teams.  These Care Teams include your primary Cardiologist (physician) and Advanced Practice Providers (APPs -  Physician Assistants and Nurse Practitioners) who all work together to provide you with the care you need, when you need it.  We recommend signing up for the patient portal called  "MyChart".  Sign up information is provided on this  After Visit Summary.  MyChart is used to connect with patients for Virtual Visits (Telemedicine).  Patients are able to view lab/test results, encounter notes, upcoming appointments, etc.  Non-urgent messages can be sent to your provider as well.   To learn more about what you can do with MyChart, go to [email or web address].    Your next appointment:   6 month(s)  The format for your next appointment:   In Person  Provider:    You may see Vincent Sable, MD or one of the following Advanced Practice Providers on your designated Care Team:    Murray Hodgkins, NP  Christell Faith, PA-C  Marrianne Mood, PA-C    Other Instructions N/A     Signed, Vincent Sable, MD  07/21/2019 4:29 PM    [place]

## 2019-07-21 NOTE — Patient Instructions (Signed)
Medication Instructions:  Your physician recommends that you continue on your current medications as directed. Please refer to the Current Medication list given to you today.  *If you need a refill on your cardiac medications before your next appointment, please call your pharmacy*   Lab Work: None ordered If you have labs (blood work) drawn today and your tests are completely normal, you will receive your results only by: . MyChart Message (if you have MyChart) OR . A paper copy in the mail If you have any lab test that is abnormal or we need to change your treatment, we will call you to review the results.   Testing/Procedures: None ordered   Follow-Up: At [facility], you and your health needs are our priority.  As part of our continuing mission to provide you with exceptional heart care, we have created designated Provider Care Teams.  These Care Teams include your primary Cardiologist (physician) and Advanced Practice Providers (APPs -  Physician Assistants and Nurse Practitioners) who all work together to provide you with the care you need, when you need it.  We recommend signing up for the patient portal called "MyChart".  Sign up information is provided on this After Visit Summary.  MyChart is used to connect with patients for Virtual Visits (Telemedicine).  Patients are able to view lab/test results, encounter notes, upcoming appointments, etc.  Non-urgent messages can be sent to your provider as well.   To learn more about what you can do with MyChart, go to [email or web address].    Your next appointment:   6 month(s)  The format for your next appointment:   In Person  Provider:    You may see Brian Agbor-Etang, MD or one of the following Advanced Practice Providers on your designated Care Team:    Christopher Berge, NP  Ryan Dunn, PA-C  Jacquelyn Visser, PA-C    Other Instructions N/A  

## 2019-08-04 ENCOUNTER — Other Ambulatory Visit: Payer: Self-pay

## 2019-08-04 ENCOUNTER — Ambulatory Visit (INDEPENDENT_AMBULATORY_CARE_PROVIDER_SITE_OTHER): Payer: Managed Care, Other (non HMO)

## 2019-08-04 DIAGNOSIS — I25810 Atherosclerosis of coronary artery bypass graft(s) without angina pectoris: Secondary | ICD-10-CM

## 2019-08-04 DIAGNOSIS — R0600 Dyspnea, unspecified: Secondary | ICD-10-CM

## 2019-08-04 DIAGNOSIS — R0609 Other forms of dyspnea: Secondary | ICD-10-CM

## 2019-08-05 ENCOUNTER — Other Ambulatory Visit: Payer: Self-pay | Admitting: Family Medicine

## 2019-08-05 DIAGNOSIS — G8929 Other chronic pain: Secondary | ICD-10-CM

## 2019-08-05 DIAGNOSIS — M533 Sacrococcygeal disorders, not elsewhere classified: Secondary | ICD-10-CM

## 2019-08-05 DIAGNOSIS — R0789 Other chest pain: Secondary | ICD-10-CM

## 2019-08-05 DIAGNOSIS — M545 Low back pain, unspecified: Secondary | ICD-10-CM

## 2019-08-05 NOTE — Telephone Encounter (Signed)
Last dispensed 30 days supply 07-09-2019.

## 2019-08-05 NOTE — Telephone Encounter (Signed)
Requested medication (s) are due for refill today: Yes  Requested medication (s) are on the active medication list: Yes  Last refill:  07/07/19  Future visit scheduled: No  Notes to clinic:  See request.    Requested Prescriptions  Pending Prescriptions Disp Refills   oxyCODONE (ROXICODONE) 15 MG immediate release tablet 180 tablet 0    Sig: Take 1 tablet (15 mg total) by mouth every 4 (four) hours as needed for pain.      Not Delegated - Analgesics:  Opioid Agonists Failed - 08/05/2019  2:46 PM      Failed - This refill cannot be delegated      Failed - Urine Drug Screen completed in last 360 days.      Passed - Valid encounter within last 6 months    Recent Outpatient Visits           3 months ago Type 2 diabetes mellitus with other specified complication, without long-term current use of insulin [facility])   [facility] Birdie Sons, MD   8 months ago Chronic midline low back pain without sciatica   [facility] Birdie Sons, MD   10 months ago [place], Wendee [place], [place]   1 year ago [facility] Birdie Sons, MD   1 year ago Uncontrolled type 2 diabetes mellitus with hyperglycemia [facility])   [facility] Birdie Sons, MD       Future Appointments             In 5 months Agbor-Etang, Aaron Edelman, MD [facility], [facility]

## 2019-08-05 NOTE — Telephone Encounter (Signed)
Copied from [place] [id number]. Topic: Quick Communication - Rx Refill/Question >> Aug 05, 2019  2:40 PM Leward Quan A wrote: Medication: oxyCODONE (ROXICODONE) 15 MG immediate release tablet  Has the patient contacted their pharmacy? Yes.   (Agent: If no, request that the patient contact the pharmacy for the refill.) (Agent: If yes, when and what did the pharmacy advise?)  Preferred Pharmacy (with phone number or street name): [place], [place]  Phone:  [phone] Fax:  [phone]     Agent: Please be advised that RX refills may take up to 3 business days. We ask that you follow-up with your pharmacy.

## 2019-08-06 MED ORDER — OXYCODONE HCL 15 MG PO TABS: 15.0000 mg | ORAL_TABLET | ORAL | 0 refills | Status: DC | PRN

## 2019-08-07 ENCOUNTER — Other Ambulatory Visit: Payer: Self-pay | Admitting: Gastroenterology

## 2019-08-07 ENCOUNTER — Other Ambulatory Visit: Payer: Self-pay | Admitting: Family Medicine

## 2019-08-07 NOTE — Telephone Encounter (Signed)
Requested medication (s) are due for refill today: yes  Requested medication (s) are on the active medication list: yes  Last refill:  04/20/19  Future visit scheduled: no  Notes to clinic: no assigned protocol   Requested Prescriptions  Pending Prescriptions Disp Refills   TRULANCE 3 MG TABS [Pharmacy Med Name: TRULANCE 3 MG TABLET] 30 tablet 4    Sig: TAKE ONE TABLET (3MG ) BY MOUTH DAILY      Off-Protocol Failed - 08/07/2019  3:51 PM      Failed - Medication not assigned to a protocol, review manually.      Passed - Valid encounter within last 12 months    Recent Outpatient Visits           3 months ago Type 2 diabetes mellitus with other specified complication, without long-term current use of insulin ([place])   [facility] Birdie Sons, MD   8 months ago Chronic midline low back pain without sciatica   [facility] Birdie Sons, MD   10 months ago [place], [place], [place]   1 year ago [place], Kirstie Peri, MD   1 year ago Uncontrolled type 2 diabetes mellitus with hyperglycemia [facility])   [facility] Birdie Sons, MD       Future Appointments             In 5 months Agbor-Etang, Aaron Edelman, MD [facility], [facility]             Refused Prescriptions Disp Refills   glipiZIDE (GLUCOTROL XL) 10 MG 24 hr tablet [Pharmacy Med Name: GLIPIZIDE XL 10 MG TABLET] 90 tablet 11    Sig: TAKE ONE TABLET BY MOUTH DAILY      Endocrinology:  Diabetes - Sulfonylureas Passed - 08/07/2019  3:51 PM      Passed - HBA1C is between 0 and 7.9 and within 180 days    Hemoglobin A1C  Date Value Ref Range Status  04/15/2019 6.3 (A) 4.0 - 5.6 % Final   Hgb A1c MFr Bld  Date Value Ref Range Status  03/25/2018 7.9 (H) 4.8 - 5.6 % Final    Comment:             Prediabetes: 5.7 - 6.4          Diabetes: >6.4          Glycemic control for adults with  diabetes: <7.0           Passed - Valid encounter within last 6 months    Recent Outpatient Visits           3 months ago Type 2 diabetes mellitus with other specified complication, without long-term current use of insulin [facility])   [facility] Birdie Sons, MD   8 months ago Chronic midline low back pain without sciatica   [facility] Birdie Sons, MD   10 months ago [place], Wendee Beavers, [place]   1 year ago [facility] Birdie Sons, MD   1 year ago Uncontrolled type 2 diabetes mellitus with hyperglycemia [facility])   [facility] Birdie Sons, MD       Future Appointments             In 5 months Agbor-Etang, Aaron Edelman, MD [facility], [facility]

## 2019-08-18 ENCOUNTER — Other Ambulatory Visit: Payer: Self-pay | Admitting: Gastroenterology

## 2019-08-26 ENCOUNTER — Other Ambulatory Visit: Payer: Self-pay | Admitting: Family Medicine

## 2019-08-26 MED ORDER — PANTOPRAZOLE SODIUM 20 MG PO TBEC: 20.0000 mg | DELAYED_RELEASE_TABLET | Freq: Every day | ORAL | 2 refills | Status: DC

## 2019-08-26 NOTE — Telephone Encounter (Signed)
[facility] faxed refill request for the following medications:   Disp Refills Start End   pantoprazole (PROTONIX) 20 MG tablet         Please advise.  Thanks, [employer]

## 2019-09-01 NOTE — Telephone Encounter (Signed)
Patient's last office visit was on 10/22/2018 and last refill was on

## 2019-09-02 ENCOUNTER — Other Ambulatory Visit: Payer: Self-pay | Admitting: Family Medicine

## 2019-09-02 DIAGNOSIS — G8929 Other chronic pain: Secondary | ICD-10-CM

## 2019-09-02 DIAGNOSIS — M533 Sacrococcygeal disorders, not elsewhere classified: Secondary | ICD-10-CM

## 2019-09-02 DIAGNOSIS — R0789 Other chest pain: Secondary | ICD-10-CM

## 2019-09-02 DIAGNOSIS — M545 Low back pain, unspecified: Secondary | ICD-10-CM

## 2019-09-02 MED ORDER — OXYCODONE HCL 15 MG PO TABS: 15.0000 mg | ORAL_TABLET | ORAL | 0 refills | Status: DC | PRN

## 2019-09-02 NOTE — Telephone Encounter (Signed)
Medication Refill - Medication: oxycodone   Has the patient contacted their pharmacy? Yes.   (Agent: If no, request that the patient contact the pharmacy for the refill.) (Agent: If yes, when and what did the pharmacy advise?)  Preferred Pharmacy (with phone number or street name):  [place], [address]  [place] [address]  Phone: [phone] Fax: [phone]  Not a 24 hour pharmacy; exact hours not known.     Agent: Please be advised that RX refills may take up to 3 business days. We ask that you follow-up with your pharmacy.

## 2019-09-02 NOTE — Telephone Encounter (Signed)
Patient is returning office call Call back [phone]

## 2019-09-05 ENCOUNTER — Other Ambulatory Visit: Payer: Self-pay | Admitting: Family Medicine

## 2019-09-05 ENCOUNTER — Ambulatory Visit (INDEPENDENT_AMBULATORY_CARE_PROVIDER_SITE_OTHER): Payer: Managed Care, Other (non HMO) | Admitting: Family Medicine

## 2019-09-05 ENCOUNTER — Other Ambulatory Visit: Payer: Self-pay

## 2019-09-05 ENCOUNTER — Encounter: Payer: Self-pay | Admitting: Family Medicine

## 2019-09-05 VITALS — BP 112/72 | HR 69 | Temp 97.3°F | Wt 205.6 lb

## 2019-09-05 DIAGNOSIS — F321 Major depressive disorder, single episode, moderate: Secondary | ICD-10-CM | POA: Diagnosis not present

## 2019-09-05 DIAGNOSIS — M5440 Lumbago with sciatica, unspecified side: Secondary | ICD-10-CM | POA: Diagnosis not present

## 2019-09-05 DIAGNOSIS — K219 Gastro-esophageal reflux disease without esophagitis: Secondary | ICD-10-CM | POA: Diagnosis not present

## 2019-09-05 DIAGNOSIS — G8929 Other chronic pain: Secondary | ICD-10-CM

## 2019-09-05 DIAGNOSIS — E1169 Type 2 diabetes mellitus with other specified complication: Secondary | ICD-10-CM

## 2019-09-05 LAB — POCT GLYCOSYLATED HEMOGLOBIN (HGB A1C)
Est. average glucose Bld gHb Est-mCnc: 148
Hemoglobin A1C: 6.8 % — AB (ref 4.0–5.6)

## 2019-09-05 MED ORDER — CITALOPRAM HYDROBROMIDE 40 MG PO TABS: 40.0000 mg | ORAL_TABLET | Freq: Every day | ORAL | 2 refills | Status: DC

## 2019-09-05 MED ORDER — CYCLOBENZAPRINE HCL 5 MG PO TABS: ORAL_TABLET | ORAL | 11 refills | Status: DC

## 2019-09-05 MED ORDER — RABEPRAZOLE SODIUM 20 MG PO TBEC: 20.0000 mg | DELAYED_RELEASE_TABLET | Freq: Every day | ORAL | 5 refills | Status: DC

## 2019-09-05 MED ORDER — PREDNISONE 10 MG PO TABS: ORAL_TABLET | ORAL | 0 refills | Status: AC

## 2019-09-05 MED ORDER — CITALOPRAM HYDROBROMIDE 40 MG PO TABS: ORAL_TABLET | ORAL | 2 refills | Status: DC

## 2019-09-05 NOTE — Telephone Encounter (Signed)
Please call patient about this prescription as below.

## 2019-09-05 NOTE — Telephone Encounter (Signed)
It looks like this was changed to pantoprazole. Please check with patient to see if he meant to get pantoprazole refilled?

## 2019-09-05 NOTE — Progress Notes (Signed)
Established patient visit   Patient: Vincent Hall   DOB: May 16, 1965   54 y.o. Male  MRN: [id number] Visit Date: 09/05/2019  Today's healthcare provider: [name], MD   Chief Complaint  Patient presents with  . Back Pain   I,Latasha Walston,acting as a scribe for [name], MD.,have documented all relevant documentation on the behalf of [name], MD,as directed by  [name], MD while in the presence of [name], MD.  Subjective    Back Pain This is a chronic problem. The current episode started more than 1 month ago. The problem occurs constantly. The problem has been gradually worsening since onset. The pain is present in the lumbar spine. The quality of the pain is described as aching. Radiates to: bilateral hips, legs and feet. Treatments tried: Oxycodone. The treatment provided mild relief.  Tried ibuprofen and tylenol which didn't help. Is worse when he when he gets up in the morning. Is going to chiropractor the last few months. No new injuries or precipitating factors. Is trying to get into clinic in [place].  Dr. Holley Raring referred him out to [facility] neurology. Tried amitriptyline and gabapentin which didn't help and affected his mood.   Has been having more anxiety last few months, has not seen psychiatrist since least year.   He also states that he is still having reflux every day while on pantoprazole. He Is sometimes taking 2-3 a day. He was previously on rabeprazole and states it completely controlled sx with just one tablet daily.      Medications: Outpatient Medications Prior to Visit  Medication Sig  . albuterol (PROVENTIL HFA;VENTOLIN HFA) 108 (90 Base) MCG/ACT inhaler Inhale 2 puffs into the lungs every 6 (six) hours as needed for wheezing or shortness of breath.  Marland Kitchen aspirin 81 MG tablet Take 81 mg by mouth daily.  Marland Kitchen azelastine (ASTELIN) 0.1 % nasal spray Place 2 sprays into both nostrils 2 (two) times daily.  Marland Kitchen azelastine (OPTIVAR) 0.05 %  ophthalmic solution drop affect eye twice a day  . Blood Glucose Monitoring Suppl (ONE TOUCH ULTRA 2) w/Device KIT USE AS DIRECTED  . citalopram (CELEXA) 20 MG tablet Take 1/2 daily for 4 days, then increase to 1 tablet daily  . cyclobenzaprine (FLEXERIL) 5 MG tablet TAKE ONE TABLET BY MOUTH THREE TIMES A DAY AS NEEDED FOR MUSCLE SPASMS  . glipiZIDE (GLIPIZIDE XL) 5 MG 24 hr tablet Take 1 tablet (5 mg total) by mouth daily with breakfast.  . glucose blood (ONE TOUCH ULTRA TEST) test strip Check blood sugar 3 times daily E11.65 (uncontrolled insulin dependent diabetes)  . ketoconazole (NIZORAL) 2 % cream Apply 1 application topically daily.  . mometasone (ELOCON) 0.1 % cream One application daily as needed for rash  . nitroGLYCERIN (NITROSTAT) 0.4 MG SL tablet Place 1 tablet (0.4 mg total) under the tongue every 5 (five) minutes as needed for chest pain.  Marland Kitchen oxyCODONE (ROXICODONE) 15 MG immediate release tablet Take 1 tablet (15 mg total) by mouth every 4 (four) hours as needed for pain.  . pantoprazole (PROTONIX) 20 MG tablet Take 1 tablet (20 mg total) by mouth daily.  . pioglitazone (ACTOS) 15 MG tablet TAKE ONE TABLET BY MOUTH DAILY  . rosuvastatin (CRESTOR) 40 MG tablet TAKE ONE TABLET BY MOUTH DAILY  . Semaglutide,0.25 or 0.5MG/DOS, (OZEMPIC, 0.25 OR 0.5 MG/DOSE,) 2 MG/1.5ML SOPN Inject 0.5 mg into the skin once a week.  . traZODone (DESYREL) 100 MG tablet TAKE TWO TABLETS  BY MOUTH EVERY NIGHT AT BEDTIME  . TRULANCE 3 MG TABS TAKE ONE TABLET (3MG) BY MOUTH DAILY  . metFORMIN (GLUCOPHAGE) 500 MG tablet Take 1 tablet (500 mg total) by mouth 2 (two) times daily with a meal. (Patient not taking: Reported on 09/05/2019)  . tiZANidine (ZANAFLEX) 4 MG capsule Take 1 capsule (4 mg total) by mouth 2 (two) times daily as needed for muscle spasms. (Patient not taking: Reported on 09/05/2019)   No facility-administered medications prior to visit.    Review of Systems  Constitutional: Negative.     Respiratory: Negative.   Cardiovascular: Negative.   Musculoskeletal: Positive for back pain.      Objective    BP 112/72 (BP Location: Right Arm, Patient Position: Sitting, Cuff Size: Large)   Pulse 69   Temp (!) 97.3 F (36.3 C) (Temporal)   Wt 205 lb 9.6 oz (93.3 kg)   BMI 31.26 kg/m    Physical Exam   General: Appearance:    Obese male in no acute distress  Eyes:    PERRL, conjunctiva/corneas clear, EOM's intact       Lungs:     Clear to auscultation bilaterally, respirations unlabored  Heart:    Normal heart rate. Normal rhythm. No murmurs, rubs, or gallops.   MS:   All extremities are intact.   Neurologic:   Awake, alert, oriented x 3. No apparent focal neurological           defect.        Results for orders placed or performed in visit on 09/05/19  POCT HgB A1C  Result Value Ref Range   Hemoglobin A1C 6.8 (A) 4.0 - 5.6 %   Est. average glucose Bld gHb Est-mCnc 148     Assessment & Plan     1. Chronic bilateral low back pain with sciatica, sciatica laterality unspecified Worsening the last few months. No relief from gabapentin or TCA prescribed from neurology. Will get on 12 day prednisone taper and refill cyclobenzaprine.  - predniSONE (DELTASONE) 10 MG tablet; 6 tablets for 2 days, then 5 for 2 days, then 4 for 2 days, then 3 for 2 days, then 2 for 2 days, then 1 for 2 days.  Dispense: 42 tablet; Refill: 0 - cyclobenzaprine (FLEXERIL) 5 MG tablet; TAKE ONE TABLET BY MOUTH THREE TIMES A DAY AS NEEDED FOR MUSCLE SPASMS  Dispense: 30 tablet; Refill: 11  He already had refill of oxycodone at [facility] ready to pick-up. Acknowledged that he may need to take extra dose occasionally until current flare calms down and may need early refill in June.   2. Type 2 diabetes mellitus with other specified complication, without long-term current use of insulin (HCC) Well controlled.  Continue current medications.    3. Current moderate episode of major depressive  disorder without prior episode (HCC) Double from 67m to citalopram (CELEXA) 40 MG tablet; Take 1 tablet (40 mg total) by mouth daily.  Dispense: 90 tablet; Refill: 2 He is to follow up with his regular psychiatrist.   4. Gastroesophageal reflux disease, unspecified whether esophagitis present Pantoprazole is not controlling symptoms whereas rabeprazole worked well in the past. Change to  RABEprazole (ACIPHEX) 20 MG tablet; Take 1 tablet (20 mg total) by mouth daily.  Dispense: 30 tablet; Refill: 5  Follow up diabetes 3-4 months for diabetes.     The entirety of the information documented in the History of Present Illness, Review of Systems and Physical Exam were personally obtained by  me. Portions of this information were initially documented by the CMA and reviewed by me for thoroughness and accuracy.      [name], MD  [facility] [phone] (phone) [phone] (fax)  [place]

## 2019-09-05 NOTE — Patient Instructions (Signed)
.   Please review the attached list of medications and notify my office if there are any errors.   . Please bring all of your medications to every appointment so we can make sure that our medication list is the same as yours.   

## 2019-09-05 NOTE — Telephone Encounter (Signed)
Patient states the Rabeprazole helped better  than the Pantoprazole does.

## 2019-09-29 ENCOUNTER — Other Ambulatory Visit: Payer: Self-pay | Admitting: Family Medicine

## 2019-09-29 DIAGNOSIS — R0789 Other chest pain: Secondary | ICD-10-CM

## 2019-09-29 DIAGNOSIS — G8929 Other chronic pain: Secondary | ICD-10-CM

## 2019-09-29 DIAGNOSIS — M533 Sacrococcygeal disorders, not elsewhere classified: Secondary | ICD-10-CM

## 2019-09-29 NOTE — Telephone Encounter (Signed)
PT need a refill oxyCODONE (ROXICODONE) 15 MG immediate release tablet [[id number]]  [facility] - [place], [place] [address]  Phone: [phone] Fax: [phone]

## 2019-09-29 NOTE — Telephone Encounter (Signed)
Requested medication (s) are due for refill today: yes  Requested medication (s) are on the active medication list: yes  Last refill:  09/02/19  Future visit scheduled: yes  Notes to clinic:  medication not delegated to NT to RF   Requested Prescriptions  Pending Prescriptions Disp Refills   oxyCODONE (ROXICODONE) 15 MG immediate release tablet 180 tablet 0    Sig: Take 1 tablet (15 mg total) by mouth every 4 (four) hours as needed for pain.      Not Delegated - Analgesics:  Opioid Agonists Failed - 09/29/2019 10:23 AM      Failed - This refill cannot be delegated      Failed - Urine Drug Screen completed in last 360 days.      Passed - Valid encounter within last 6 months    Recent Outpatient Visits           3 weeks ago Chronic bilateral low back pain with sciatica, sciatica laterality unspecified   [facility] Birdie Sons, MD   5 months ago Type 2 diabetes mellitus with other specified complication, without long-term current use of insulin [facility])   [facility] Birdie Sons, MD   10 months ago Chronic midline low back pain without sciatica   [facility] Birdie Sons, MD   1 year ago [place], [place]   1 year ago [place], Kirstie Peri, MD       Future Appointments             In 3 months [place], Kirstie Peri, MD [facility], [place]   In 3 months Agbor-Etang, Aaron Edelman, MD [facility], [place]

## 2019-09-30 MED ORDER — OXYCODONE HCL 15 MG PO TABS: ORAL_TABLET | ORAL | 0 refills | Status: DC

## 2019-10-01 ENCOUNTER — Other Ambulatory Visit: Payer: Self-pay | Admitting: Family Medicine

## 2019-10-01 DIAGNOSIS — E1165 Type 2 diabetes mellitus with hyperglycemia: Secondary | ICD-10-CM

## 2019-10-09 ENCOUNTER — Other Ambulatory Visit: Payer: Self-pay | Admitting: Family Medicine

## 2019-10-09 MED ORDER — ROSUVASTATIN CALCIUM 40 MG PO TABS: 40.0000 mg | ORAL_TABLET | Freq: Every day | ORAL | 3 refills | Status: DC

## 2019-10-09 NOTE — Telephone Encounter (Signed)
[facility] in [place] is calling and the pt does not have refill at [facility] to transfer to them. [facility] needs new rx rosuvastatin 40 mg

## 2019-10-24 ENCOUNTER — Other Ambulatory Visit: Payer: Self-pay | Admitting: Family Medicine

## 2019-10-24 MED ORDER — OXYCODONE HCL 15 MG PO TABS: ORAL_TABLET | ORAL | 0 refills | Status: AC

## 2019-10-24 NOTE — Telephone Encounter (Signed)
Requested medication (s) are due for refill today: no  Requested medication (s) are on the active medication list: yes  Last refill:  09/30/2019  Future visit scheduled: yes  Notes to clinic: this refill cannot be delegated    Requested Prescriptions  Pending Prescriptions Disp Refills   oxyCODONE (ROXICODONE) 15 MG immediate release tablet 180 tablet 0    Sig: 1 tablet every 3-4 hours as needed.      Not Delegated - Analgesics:  Opioid Agonists Failed - 10/24/2019  1:10 PM      Failed - This refill cannot be delegated      Failed - Urine Drug Screen completed in last 360 days.      Passed - Valid encounter within last 6 months    Recent Outpatient Visits           1 month ago Chronic bilateral low back pain with sciatica, sciatica laterality unspecified   [facility] Birdie Sons, MD   6 months ago Type 2 diabetes mellitus with other specified complication, without long-term current use of insulin [facility])   [facility] Birdie Sons, MD   11 months ago Chronic midline low back pain without sciatica   [facility] Birdie Sons, MD   1 year ago [place] Trinna Post, [place]   1 year ago [place], Kirstie Peri, MD       Future Appointments             In 2 months [place], Kirstie Peri, MD [facility], [place]   In 3 months Agbor-Etang, Aaron Edelman, MD [facility], [place]

## 2019-10-24 NOTE — Telephone Encounter (Signed)
Medication Refill - Medication:  oxyCODONE (ROXICODONE) 15 MG immediate release tablet [[id number]]    Preferred Pharmacy (with phone number or street name):  [place], [place] [address]  Phone: [phone] Fax: [phone]     Agent: Please be advised that RX refills may take up to 3 business days. We ask that you follow-up with your pharmacy.

## 2019-11-22 ENCOUNTER — Other Ambulatory Visit: Payer: Self-pay | Admitting: Family Medicine

## 2019-11-22 DIAGNOSIS — E1169 Type 2 diabetes mellitus with other specified complication: Secondary | ICD-10-CM

## 2019-11-22 NOTE — Telephone Encounter (Signed)
Requested Prescriptions  Pending Prescriptions Disp Refills  . OZEMPIC, 0.25 OR 0.5 MG/DOSE, 2 MG/1.5ML SOPN [Pharmacy Med Name: OZEMPIC (0.25 OR 0.5 MG/DOSE) 2 MG/] 1.5 mL 0    Sig: INJECT 0.5MG  SUBQ ONCE A [place]     Endocrinology:  Diabetes - GLP-1 Receptor Agonists Passed - 11/22/2019 11:49 AM      Passed - HBA1C is between 0 and 7.9 and within 180 days    Hemoglobin A1C  Date Value Ref Range Status  09/05/2019 6.8 (A) 4.0 - 5.6 % Final   Hgb A1c MFr Bld  Date Value Ref Range Status  03/25/2018 7.9 (H) 4.8 - 5.6 % Final    Comment:             Prediabetes: 5.7 - 6.4          Diabetes: >6.4          Glycemic control for adults with diabetes: <7.0          Passed - Valid encounter within last 6 months    Recent Outpatient Visits          2 months ago Chronic bilateral low back pain with sciatica, sciatica laterality unspecified   [facility] Birdie Sons, MD   7 months ago Type 2 diabetes mellitus with other specified complication, without long-term current use of insulin [facility])   [facility] Birdie Sons, MD   1 year ago Chronic midline low back pain without sciatica   [facility] Birdie Sons, MD   1 year ago [place], Wendee Beavers, [place]   1 year ago [place], Kirstie Peri, MD      Future Appointments            In 1 month [place], Kirstie Peri, MD [facility], [place]   In 2 months Agbor-Etang, Aaron Edelman, MD [facility], [place]

## 2019-11-24 ENCOUNTER — Other Ambulatory Visit: Payer: Self-pay | Admitting: Family Medicine

## 2019-11-24 DIAGNOSIS — M545 Low back pain, unspecified: Secondary | ICD-10-CM

## 2019-11-24 DIAGNOSIS — G8929 Other chronic pain: Secondary | ICD-10-CM

## 2019-11-24 DIAGNOSIS — R0789 Other chest pain: Secondary | ICD-10-CM

## 2019-11-24 NOTE — Telephone Encounter (Signed)
Patient of Dr. Caryn Section can you please review. KW

## 2019-11-24 NOTE — Telephone Encounter (Signed)
Requested medication (s) are due for refill today: no  Requested medication (s) are on the active medication list: yes  Last refill:  09/02/2019  Future visit scheduled: yes   Notes to clinic:  this refill cannot be delegated    Requested Prescriptions  Pending Prescriptions Disp Refills   oxyCODONE (ROXICODONE) 15 MG immediate release tablet 180 tablet 0    Sig: Take 1 tablet (15 mg total) by mouth every 4 (four) hours as needed for pain.      Not Delegated - Analgesics:  Opioid Agonists Failed - 11/24/2019 12:08 PM      Failed - This refill cannot be delegated      Failed - Urine Drug Screen completed in last 360 days.      Passed - Valid encounter within last 6 months    Recent Outpatient Visits           2 months ago Chronic bilateral low back pain with sciatica, sciatica laterality unspecified   [facility] Birdie Sons, MD   7 months ago Type 2 diabetes mellitus with other specified complication, without long-term current use of insulin [facility])   [facility] Birdie Sons, MD   1 year ago Chronic midline low back pain without sciatica   [facility] Birdie Sons, MD   1 year ago [place] Trinna Post, [place]   1 year ago [place], Kirstie Peri, MD       Future Appointments             In 1 month Fisher, Kirstie Peri, MD [facility], [place]   In 2 months Agbor-Etang, Aaron Edelman, MD [facility], [place]

## 2019-11-24 NOTE — Addendum Note (Signed)
Addended by: Elliot Cousin on: 11/24/2019 12:08 PM   Modules accepted: Orders

## 2019-11-24 NOTE — Telephone Encounter (Signed)
PT need a refill / hes out  oxyCODONE (ROXICODONE) 15 MG immediate release tablet [[id number]]  [place], [place] [address]  Phone: [phone] Fax: [phone]

## 2019-11-25 NOTE — Telephone Encounter (Signed)
Pt called today about status of refill request for oxyCODONE (ROXICODONE) 15 MG immediate release tablet  /Please advise

## 2019-11-26 MED ORDER — OXYCODONE HCL 15 MG PO TABS: 15.0000 mg | ORAL_TABLET | ORAL | 0 refills | Status: DC | PRN

## 2019-12-17 ENCOUNTER — Other Ambulatory Visit: Payer: Self-pay | Admitting: Family Medicine

## 2019-12-17 DIAGNOSIS — E1169 Type 2 diabetes mellitus with other specified complication: Secondary | ICD-10-CM

## 2019-12-19 ENCOUNTER — Other Ambulatory Visit: Payer: Self-pay | Admitting: Family Medicine

## 2019-12-19 DIAGNOSIS — M533 Sacrococcygeal disorders, not elsewhere classified: Secondary | ICD-10-CM

## 2019-12-19 DIAGNOSIS — M545 Low back pain, unspecified: Secondary | ICD-10-CM

## 2019-12-19 DIAGNOSIS — R0789 Other chest pain: Secondary | ICD-10-CM

## 2019-12-19 DIAGNOSIS — G8929 Other chronic pain: Secondary | ICD-10-CM

## 2019-12-19 MED ORDER — OXYCODONE HCL 15 MG PO TABS: 15.0000 mg | ORAL_TABLET | ORAL | 0 refills | Status: DC | PRN

## 2019-12-19 NOTE — Telephone Encounter (Signed)
Patient came by the office to request refill on Oxycodone 15 mg.   [facility]    HE WILL BE OUT TOMORROW

## 2019-12-19 NOTE — Telephone Encounter (Signed)
Please advise 

## 2020-01-06 ENCOUNTER — Encounter: Payer: Self-pay | Admitting: Family Medicine

## 2020-01-06 ENCOUNTER — Ambulatory Visit (INDEPENDENT_AMBULATORY_CARE_PROVIDER_SITE_OTHER): Payer: Managed Care, Other (non HMO) | Admitting: Family Medicine

## 2020-01-06 ENCOUNTER — Other Ambulatory Visit: Payer: Self-pay

## 2020-01-06 VITALS — BP 119/65 | HR 69 | Temp 98.6°F | Resp 16 | Wt 220.0 lb

## 2020-01-06 DIAGNOSIS — M546 Pain in thoracic spine: Secondary | ICD-10-CM

## 2020-01-06 DIAGNOSIS — Z23 Encounter for immunization: Secondary | ICD-10-CM | POA: Diagnosis not present

## 2020-01-06 DIAGNOSIS — M545 Low back pain, unspecified: Secondary | ICD-10-CM

## 2020-01-06 DIAGNOSIS — M533 Sacrococcygeal disorders, not elsewhere classified: Secondary | ICD-10-CM

## 2020-01-06 DIAGNOSIS — I2510 Atherosclerotic heart disease of native coronary artery without angina pectoris: Secondary | ICD-10-CM

## 2020-01-06 DIAGNOSIS — G8929 Other chronic pain: Secondary | ICD-10-CM

## 2020-01-06 DIAGNOSIS — R140 Abdominal distension (gaseous): Secondary | ICD-10-CM

## 2020-01-06 DIAGNOSIS — E1165 Type 2 diabetes mellitus with hyperglycemia: Secondary | ICD-10-CM | POA: Diagnosis not present

## 2020-01-06 DIAGNOSIS — R0789 Other chest pain: Secondary | ICD-10-CM

## 2020-01-06 DIAGNOSIS — F321 Major depressive disorder, single episode, moderate: Secondary | ICD-10-CM

## 2020-01-06 LAB — POCT GLYCOSYLATED HEMOGLOBIN (HGB A1C)
Est. average glucose Bld gHb Est-mCnc: 183
Hemoglobin A1C: 8 % — AB (ref 4.0–5.6)

## 2020-01-06 MED ORDER — PIOGLITAZONE HCL 30 MG PO TABS: 30.0000 mg | ORAL_TABLET | Freq: Every day | ORAL | 3 refills | Status: DC

## 2020-01-06 MED ORDER — CELECOXIB 100 MG PO CAPS: 100.0000 mg | ORAL_CAPSULE | Freq: Two times a day (BID) | ORAL | 2 refills | Status: DC

## 2020-01-06 MED ORDER — OXYCODONE HCL 15 MG PO TABS: 15.0000 mg | ORAL_TABLET | ORAL | 0 refills | Status: DC | PRN

## 2020-01-06 NOTE — Progress Notes (Signed)
Established patient visit   Patient: Vincent Hall   DOB: 1965/10/24   54 y.o. Male  MRN: [id number] Visit Date: 01/06/2020  Today's healthcare provider: [name], MD   Chief Complaint  Patient presents with  . Diabetes  . Bloated   Subjective    HPI  Diabetes Mellitus Type II, Follow-up  Lab Results  Component Value Date   HGBA1C 8.0 (A) 01/06/2020   HGBA1C 6.8 (A) 09/05/2019   HGBA1C 6.3 (A) 04/15/2019   Wt Readings from Last 3 Encounters:  01/06/20 220 lb (99.8 kg)  09/05/19 205 lb 9.6 oz (93.3 kg)  07/21/19 203 lb (92.1 kg)   Last seen for diabetes 4 months ago.  Management since then includes no changes. He was previously taken off of metformin due to persistent upset stomach which he states is much better since then.  He reports good compliance with treatment. He is not having side effects.    Home blood sugar records: fasting range: 200s  Episodes of hypoglycemia? No    Current insulin regiment: Ozempic 0.5 MG  Most Recent Eye Exam: 08/2019 Current exercise: none Current diet habits: well balanced  Pertinent Labs: Lab Results  Component Value Date   CHOL 172 04/15/2019   HDL 41 04/15/2019   LDLCALC 83 04/15/2019   TRIG 290 (H) 04/15/2019   CHOLHDL 4.2 04/15/2019   Lab Results  Component Value Date   NA 139 04/15/2019   K 4.3 04/15/2019   CREATININE 0.89 04/15/2019   GFRNONAA 96 04/15/2019   GFRAA 111 04/15/2019   GLUCOSE 99 04/15/2019      Bloating Patient also mentions that he has felt bloated in the last 2 weeks. He denies any changes in eating habits. He denies diarrhea symptoms. He has not vomited. He has not had this before. He has had chronic constipation for many years for which he is Trulance, which he states has helped. Is still constipated, but no worse than usual.   Chronic pain He continue on oxycodone 15 six times day for chronic chest wall pain since CABG and worsening mild to low back pain. He has been having to  take an extra tablet frequently and wants to increase the quantity of his prescription. He did go to pain clinic last year and states it was not helpful. He states he does have an appointment with another back specialist in October.       Medications: Outpatient Medications Prior to Visit  Medication Sig  . albuterol (PROVENTIL HFA;VENTOLIN HFA) 108 (90 Base) MCG/ACT inhaler Inhale 2 puffs into the lungs every 6 (six) hours as needed for wheezing or shortness of breath.  Marland Kitchen aspirin 81 MG tablet Take 81 mg by mouth daily.  Marland Kitchen azelastine (ASTELIN) 0.1 % nasal spray Place 2 sprays into both nostrils 2 (two) times daily.  Marland Kitchen azelastine (OPTIVAR) 0.05 % ophthalmic solution drop affect eye twice a day  . Blood Glucose Monitoring Suppl (ONE TOUCH ULTRA 2) w/Device KIT USE AS DIRECTED  . citalopram (CELEXA) 40 MG tablet Take 1 tablet (40 mg total) by mouth daily.  . cyclobenzaprine (FLEXERIL) 5 MG tablet TAKE ONE TABLET BY MOUTH THREE TIMES A DAY AS NEEDED FOR MUSCLE SPASMS  . glipiZIDE (GLIPIZIDE XL) 5 MG 24 hr tablet Take 1 tablet (5 mg total) by mouth daily with breakfast.  . glucose blood (ONE TOUCH ULTRA TEST) test strip Check blood sugar 3 times daily E11.65 (uncontrolled insulin dependent diabetes)  . ketoconazole (NIZORAL) 2 %  cream Apply 1 application topically daily.  . mometasone (ELOCON) 0.1 % cream One application daily as needed for rash  . nitroGLYCERIN (NITROSTAT) 0.4 MG SL tablet Place 1 tablet (0.4 mg total) under the tongue every 5 (five) minutes as needed for chest pain.  Marland Kitchen oxyCODONE (ROXICODONE) 15 MG immediate release tablet Take 1 tablet (15 mg total) by mouth every 4 (four) hours as needed for pain.  Marland Kitchen OZEMPIC, 0.25 OR 0.5 MG/DOSE, 2 MG/1.5ML SOPN INJECT 0.5MG SUBQ ONCE A WEEK  . pioglitazone (ACTOS) 15 MG tablet TAKE ONE TABLET BY MOUTH DAILY  . RABEprazole (ACIPHEX) 20 MG tablet TAKE ONE TABLET BY MOUTH DAILY IN PLACE OF DEXILANT  . rosuvastatin (CRESTOR) 40 MG tablet Take 1  tablet (40 mg total) by mouth daily.  . traZODone (DESYREL) 100 MG tablet TAKE TWO TABLETS BY MOUTH EVERY NIGHT AT BEDTIME  . TRULANCE 3 MG TABS TAKE ONE TABLET (3MG) BY MOUTH DAILY  . metFORMIN (GLUCOPHAGE) 500 MG tablet Take 1 tablet (500 mg total) by mouth 2 (two) times daily with a meal. (Patient not taking: Reported on 09/05/2019)  . RABEprazole (ACIPHEX) 20 MG tablet Take 1 tablet (20 mg total) by mouth daily.  Marland Kitchen tiZANidine (ZANAFLEX) 4 MG capsule Take 1 capsule (4 mg total) by mouth 2 (two) times daily as needed for muscle spasms. (Patient not taking: Reported on 09/05/2019)   No facility-administered medications prior to visit.       Objective    BP 119/65   Pulse 69   Temp 98.6 F (37 C)   Resp 16   Wt 220 lb (99.8 kg)   BMI 33.45 kg/m    Physical Exam   General: Appearance:    Obese male in no acute distress  Eyes:    PERRL, conjunctiva/corneas clear, EOM's intact       Lungs:     Clear to auscultation bilaterally, respirations unlabored  Heart:    Normal heart rate. Normal rhythm. No murmurs, rubs, or gallops.   MS:   All extremities are intact.   Neurologic:   Awake, alert, oriented x 3. No apparent focal neurological           defect.         Results for orders placed or performed in visit on 01/06/20  POCT HgB A1C  Result Value Ref Range   Hemoglobin A1C 8.0 (A) 4.0 - 5.6 %   Est. average glucose Bld gHb Est-mCnc 183     Assessment & Plan     1. Uncontrolled type 2 diabetes mellitus with hyperglycemia (HCC) A1c worse since stopping metformin due to upset stomach. Will increase from 71m to - pioglitazone (ACTOS) 30 MG tablet; Take 1 tablet (30 mg total) by mouth daily.  Dispense: 90 tablet; Refill: 3 - TSH  2. Abdominal bloating Recent onset in setting of chronic constipation which has not changed recently.  - DG Abd 2 Views; Future - CBC - TSH  3. Chronic midline thoracic back pain   4. Chronic midline low back pain without sciatica add-  celecoxib (CELEBREX) 100 MG capsule; Take 1 capsule (100 mg total) by mouth 2 (two) times daily.  Dispense: 60 capsule; Refill: 2 continue oxyCODONE (ROXICODONE) 15 MG immediate release tablet; Take 1 tablet (15 mg total) by mouth every 4 (four) hours as needed for pain.  Dispense: 180 tablet; Refill: 0  He has scheduled himself evaluation with back specialist in October.   5. Chest wall pain refill- oxyCODONE (ROXICODONE)  15 MG immediate release tablet; Take 1 tablet (15 mg total) by mouth every 4 (four) hours as needed for pain.  Dispense: 180 tablet; Refill: 0  6. Chronic left SI joint pain refill- oxyCODONE (ROXICODONE) 15 MG immediate release tablet; Take 1 tablet (15 mg total) by mouth every 4 (four) hours as needed for pain.  Dispense: 180 tablet; Refill: 0  7. Coronary artery disease involving native coronary artery of native heart without angina pectoris Asymptomatic. Compliant with medication.  Continue aggressive risk factor modification.  Continue routine cardiology follow up.  - Comprehensive metabolic panel - Lipid panel - Amylase  8. Current moderate episode of major depressive disorder without prior episode ([place]) Doing well con current dose of citalopram which he is to continue unchanged.   9. Need for influenza vaccination  - Flu Vaccine QUAD 6+ mos PF IM (Fluarix Quad PF)    Addressed extensive list of chronic and acute medical problems today requiring 45 minutes reviewing his medical record, counseling patient regarding his conditions and coordination of care.        The entirety of the information documented in the History of Present Illness, Review of Systems and Physical Exam were personally obtained by me. Portions of this information were initially documented by the CMA and reviewed by me for thoroughness and accuracy.      [name], MD  [facility] [phone] (phone) [phone] (fax)  [place]

## 2020-01-06 NOTE — Patient Instructions (Addendum)
.   Increase pioglitazone to 30mg  daily. You can use of your 15mg  tablets by taking two every day until they are gone.   . Please review the attached list of medications and notify my office if there are any errors.   . Please bring all of your medications to every appointment so we can make sure that our medication list is the same as yours.    Try OTC probiotic such as Align to help with abdominal bloating. Avoid all dairy products.

## 2020-01-07 LAB — COMPREHENSIVE METABOLIC PANEL
ALT: 26 IU/L (ref 0–44)
AST: 15 IU/L (ref 0–40)
Albumin/Globulin Ratio: 1.7 (ref 1.2–2.2)
Albumin: 4.2 g/dL (ref 3.8–4.9)
Alkaline Phosphatase: 61 IU/L (ref 44–121)
BUN/Creatinine Ratio: 10 (ref 9–20)
BUN: 8 mg/dL (ref 6–24)
Bilirubin Total: 0.2 mg/dL (ref 0.0–1.2)
CO2: 27 mmol/L (ref 20–29)
Calcium: 9.6 mg/dL (ref 8.7–10.2)
Chloride: 100 mmol/L (ref 96–106)
Creatinine, Ser: 0.84 mg/dL (ref 0.76–1.27)
GFR calc Af Amer: 114 mL/min/{1.73_m2} (ref >59)
GFR calc non Af Amer: 99 mL/min/{1.73_m2} (ref >59)
Globulin, Total: 2.5 g/dL (ref 1.5–4.5)
Glucose: 104 mg/dL — ABNORMAL HIGH (ref 65–99)
Potassium: 4.6 mmol/L (ref 3.5–5.2)
Sodium: 138 mmol/L (ref 134–144)
Total Protein: 6.7 g/dL (ref 6.0–8.5)

## 2020-01-07 LAB — TSH: TSH: 1.81 u[IU]/mL (ref 0.450–4.500)

## 2020-01-07 LAB — AMYLASE: Amylase: 41 U/L (ref 31–110)

## 2020-01-07 LAB — LIPID PANEL
Chol/HDL Ratio: 3.5 ratio (ref 0.0–5.0)
Cholesterol, Total: 140 mg/dL (ref 100–199)
HDL: 40 mg/dL (ref >39)
LDL Chol Calc (NIH): 69 mg/dL (ref 0–99)
Triglycerides: 186 mg/dL — ABNORMAL HIGH (ref 0–149)
VLDL Cholesterol Cal: 31 mg/dL (ref 5–40)

## 2020-01-07 LAB — CBC
Hematocrit: 42.6 % (ref 37.5–51.0)
Hemoglobin: 13.6 g/dL (ref 13.0–17.7)
MCH: 27.1 pg (ref 26.6–33.0)
MCHC: 31.9 g/dL (ref 31.5–35.7)
MCV: 85 fL (ref 79–97)
Platelets: 177 10*3/uL (ref 150–450)
RBC: 5.02 x10E6/uL (ref 4.14–5.80)
RDW: 12.6 % (ref 11.6–15.4)
WBC: 6.4 10*3/uL (ref 3.4–10.8)

## 2020-01-09 ENCOUNTER — Telehealth: Payer: Self-pay | Admitting: Family Medicine

## 2020-01-09 NOTE — Telephone Encounter (Signed)
Please review. KW 

## 2020-01-09 NOTE — Telephone Encounter (Signed)
Pt states the pharmacy will not fill his  oxyCODONE (ROXICODONE) 15 MG immediate release tablet  The pharmacy is stating it is too early.  But pt states he explained to Dr Caryn Section that he has had to take extra tabs due to his back pain.  Pt would like the dr to call the pharmacy And let them know ok to fill today.  [place], [place]. Phone:  [phone]  Fax:  [phone]

## 2020-01-12 NOTE — Telephone Encounter (Signed)
Patient called again to check on the status of his Oxycodone being okay' ed to be filled early per Dr. Caryn Section

## 2020-01-13 NOTE — Telephone Encounter (Signed)
Wife called back and is concerned for the pt's welfare. She states he has been out of medication the past 2 days and this is day 3. Pharmacy says they will not fill until 10/8.  She is afraid if they wait that long he may be back in hospital.  She is also requesting Arbie Cookey to call him directly to the pt. Or if the dr nurse will talk to Dr Caryn Section to make sure he gets the message.

## 2020-01-13 NOTE — Telephone Encounter (Signed)
Pt has only 2 pills left and needs medication today. Pt daughter Lysle Dingwall is calling

## 2020-01-14 ENCOUNTER — Other Ambulatory Visit: Payer: Self-pay | Admitting: Family Medicine

## 2020-01-14 DIAGNOSIS — E1169 Type 2 diabetes mellitus with other specified complication: Secondary | ICD-10-CM

## 2020-01-14 NOTE — Telephone Encounter (Signed)
Please advise pharmacy OK to dispense oxycodone today. They should prescription from last week on file.

## 2020-01-15 ENCOUNTER — Encounter: Payer: Self-pay | Admitting: Family Medicine

## 2020-01-15 ENCOUNTER — Other Ambulatory Visit: Payer: Self-pay

## 2020-01-15 ENCOUNTER — Ambulatory Visit (INDEPENDENT_AMBULATORY_CARE_PROVIDER_SITE_OTHER): Payer: Managed Care, Other (non HMO) | Admitting: Family Medicine

## 2020-01-15 VITALS — BP 140/80 | HR 61 | Ht 68.0 in | Wt 220.0 lb

## 2020-01-15 DIAGNOSIS — M999 Biomechanical lesion, unspecified: Secondary | ICD-10-CM | POA: Insufficient documentation

## 2020-01-15 DIAGNOSIS — M5440 Lumbago with sciatica, unspecified side: Secondary | ICD-10-CM

## 2020-01-15 DIAGNOSIS — M533 Sacrococcygeal disorders, not elsewhere classified: Secondary | ICD-10-CM

## 2020-01-15 DIAGNOSIS — G8929 Other chronic pain: Secondary | ICD-10-CM

## 2020-01-15 DIAGNOSIS — F321 Major depressive disorder, single episode, moderate: Secondary | ICD-10-CM

## 2020-01-15 DIAGNOSIS — M2550 Pain in unspecified joint: Secondary | ICD-10-CM | POA: Diagnosis not present

## 2020-01-15 DIAGNOSIS — M545 Low back pain, unspecified: Secondary | ICD-10-CM

## 2020-01-15 LAB — CBC WITH DIFFERENTIAL/PLATELET
Basophils Absolute: 0 10*3/uL (ref 0.0–0.1)
Basophils Relative: 0.4 % (ref 0.0–3.0)
Eosinophils Absolute: 0.4 10*3/uL (ref 0.0–0.7)
Eosinophils Relative: 5.7 % — ABNORMAL HIGH (ref 0.0–5.0)
HCT: 40.4 % (ref 39.0–52.0)
Hemoglobin: 13.8 g/dL (ref 13.0–17.0)
Lymphocytes Relative: 43.1 % (ref 12.0–46.0)
Lymphs Abs: 2.8 10*3/uL (ref 0.7–4.0)
MCHC: 34.1 g/dL (ref 30.0–36.0)
MCV: 83 fl (ref 78.0–100.0)
Monocytes Absolute: 0.5 10*3/uL (ref 0.1–1.0)
Monocytes Relative: 8.1 % (ref 3.0–12.0)
Neutro Abs: 2.8 10*3/uL (ref 1.4–7.7)
Neutrophils Relative %: 42.7 % — ABNORMAL LOW (ref 43.0–77.0)
Platelets: 173 10*3/uL (ref 150.0–400.0)
RBC: 4.87 Mil/uL (ref 4.22–5.81)
RDW: 13 % (ref 11.5–15.5)
WBC: 6.5 10*3/uL (ref 4.0–10.5)

## 2020-01-15 LAB — COMPREHENSIVE METABOLIC PANEL
ALT: 23 U/L (ref 0–53)
AST: 11 U/L (ref 0–37)
Albumin: 4.4 g/dL (ref 3.5–5.2)
Alkaline Phosphatase: 54 U/L (ref 39–117)
BUN: 8 mg/dL (ref 6–23)
CO2: 34 mEq/L — ABNORMAL HIGH (ref 19–32)
Calcium: 9.4 mg/dL (ref 8.4–10.5)
Chloride: 101 mEq/L (ref 96–112)
Creatinine, Ser: 0.98 mg/dL (ref 0.40–1.50)
GFR: 85.87 mL/min (ref >60.00)
Glucose, Bld: 196 mg/dL — ABNORMAL HIGH (ref 70–99)
Potassium: 4 mEq/L (ref 3.5–5.1)
Sodium: 139 mEq/L (ref 135–145)
Total Bilirubin: 0.3 mg/dL (ref 0.2–1.2)
Total Protein: 6.8 g/dL (ref 6.0–8.3)

## 2020-01-15 LAB — C-REACTIVE PROTEIN: CRP: <1 mg/dL (ref 0.5–20.0)

## 2020-01-15 LAB — LIPASE: Lipase: 25 U/L (ref 11.0–59.0)

## 2020-01-15 LAB — IBC PANEL
Iron: 99 ug/dL (ref 42–165)
Saturation Ratios: 24.2 % (ref 20.0–50.0)
Transferrin: 292 mg/dL (ref 212.0–360.0)

## 2020-01-15 LAB — VITAMIN D 25 HYDROXY (VIT D DEFICIENCY, FRACTURES): VITD: 18.6 ng/mL — ABNORMAL LOW (ref 30.00–100.00)

## 2020-01-15 LAB — URIC ACID: Uric Acid, Serum: 4.4 mg/dL (ref 4.0–7.8)

## 2020-01-15 LAB — TSH: TSH: 4.45 u[IU]/mL (ref 0.35–4.50)

## 2020-01-15 LAB — VITAMIN B12: Vitamin B-12: 483 pg/mL (ref 211–911)

## 2020-01-15 LAB — SEDIMENTATION RATE: Sed Rate: 3 mm/hr (ref 0–20)

## 2020-01-15 LAB — FERRITIN: Ferritin: 29.9 ng/mL (ref 22.0–322.0)

## 2020-01-15 NOTE — Assessment & Plan Note (Signed)
   Decision today to treat with OMT was based on Physical Exam  After verbal consent patient was treated with  ME, FPR techniques in  lumbar and sacral areas, all areas are chronic   Patient tolerated the procedure well with improvement in symptoms  Patient given exercises, stretches and lifestyle modifications  See medications in patient instructions if given  Patient will follow up in 4-8 weeks

## 2020-01-15 NOTE — Telephone Encounter (Signed)
Pharmacy said this is the second time they are refilling it early and they were reluctant to fill it this time but said they are not going to fill it early again.

## 2020-01-15 NOTE — Assessment & Plan Note (Signed)
Attempted osteopathic manipulation with very minimal benefit.  We will see how patient responds but we will get laboratory work-up to make sure nothing else is being missed.

## 2020-01-15 NOTE — Assessment & Plan Note (Signed)
Patient has had chronic back pain for quite some time.  Known degenerative disc disease.  Patient states that he has had advanced imaging as well as nerve conduction test.  I was able to find the most recent nerve conduction test that was unremarkable.  Patient has had many other comorbidities that could also be contributing to this.  Patient has had some difficulty with his diabetes recently as well as cardiac issues history of CABG and continued pain.  History of major depressive disorder and is on Celexa.  I would like to check with primary care provider to see if we can transition patient from citalopram to Cymbalta for more pain relief.  Laboratory work-up ordered today to further evaluate for such things as autoimmune that could be contributing.  Patient will start with formal physical therapy which he has not done in the past he states.  Follow-up with me again in 4 to 6 weeks.  Attempted very mild osteopathic manipulation of more muscle energy and we will see how patient responds.

## 2020-01-15 NOTE — Progress Notes (Signed)
[place] [address] Vincent Hall Phone: [phone] Subjective:   I Vincent Hall am serving as a Education administrator for Dr. Hulan Saas.  This visit occurred during the SARS-CoV-2 public health emergency.  Safety protocols were in place, including screening questions prior to the visit, additional usage of staff PPE, and extensive cleaning of exam room while observing appropriate contact time as indicated for disinfecting solutions.   I'm seeing this patient by the request  of:  Birdie Sons, MD  CC: Low back pain  [id number]  Vincent Hall is a 54 y.o. male coming in with complaint of back pain. Patient states he has lower back pain that radiates down his legs. Had open heart surgery and states his sternum moves. States he has seen multiple doctors and they can't tell him the issue. Has been taking oxycodone 15 mg. Stretches his back and uses other modalities for pain. He also states he has joint pain in the lower extremity. The pain is sharp and most common on the left side. Standing for 15 - 20 minutes causes sharp pain in his lower back. Sitting relieves pain. 10/10 at its worse. Loss of ROM.  Patient is on chronic narcotics from another provider. Patient has had a work-up including a recent nerve conduction study that was independently visualized by me showing no significant neurologic component. MRI of the lumbar spine was from 2019 and found to have an L3-L4 disc extrusion but no true canal or foraminal narrowing noted. Some worsening of his diabetes with an A1c of 8      Past Medical History:  Diagnosis Date  . Anxiety   . Asthma   . Bipolar 2 disorder ([place])   . Coronary artery disease   . Diabetes ([place])   . Fatty liver   . History of MI (myocardial infarction)   . STEMI (ST elevation myocardial infarction) ([place]) 06/22/2013   Past Surgical History:  Procedure Laterality Date  . APPENDECTOMY    . CARDIAC CATHETERIZATION   06/24/2013  . COLONOSCOPY WITH PROPOFOL N/A 09/23/2014   Procedure: COLONOSCOPY WITH PROPOFOL;  Surgeon: Lucilla Lame, MD;  Location: [facility];  Service: Endoscopy;  Laterality: N/A;  . COLONOSCOPY WITH PROPOFOL N/A 10/30/2018   Procedure: COLONOSCOPY WITH PROPOFOL;  Surgeon: Virgel Manifold, MD;  Location: [facility];  Service: Endoscopy;  Laterality: N/A;  . CORONARY ANGIOPLASTY     stent placement to LAD  . CORONARY ARTERY BYPASS GRAFT  07/31/2013   x 3 vessels  . ESOPHAGOGASTRODUODENOSCOPY (EGD) WITH PROPOFOL N/A 10/30/2018   Procedure: ESOPHAGOGASTRODUODENOSCOPY (EGD) WITH PROPOFOL;  Surgeon: Virgel Manifold, MD;  Location: [facility] END[facility];  Service: Endoscopy;  Laterality: N/A;  . FINGER SURGERY    . NASAL SINUS SURGERY    . POLYPECTOMY  09/23/2014   Procedure: POLYPECTOMY;  Surgeon: Lucilla Lame, MD;  Location: [facility];  Service: Endoscopy;;   Social History   Socioeconomic History  . Marital status: Married    Spouse name: clementin  . Number of children: 2  . Years of education: HS Grad  . Highest education level: Associate degree: occupational, Hotel manager, or vocational program  Occupational History  . Occupation: Employeed  Tobacco Use  . Smoking status: Former Smoker    Packs/day: 1.50    Years: 30.00    Pack years: 45.00    Types: Cigarettes    Quit date: 08/08/2013    Years since quitting: 6.4  . Smokeless tobacco: Never  Used  Vaping Use  . Vaping Use: Never used  Substance and Sexual Activity  . Alcohol use: No  . Drug use: Never  . Sexual activity: Not Currently  Other Topics Concern  . Not on file  Social History Narrative  . Not on file   Social Determinants of Health   Financial Resource Strain:   . Difficulty of Paying Living Expenses: Not on file  Food Insecurity:   . Worried About Charity fundraiser in the Last Year: Not on file  . Ran Out of Food in the Last Year: Not on file  Transportation Needs:   . Lack of  Transportation (Medical): Not on file  . Lack of Transportation (Non-Medical): Not on file  Physical Activity:   . Days of Exercise per Week: Not on file  . Minutes of Exercise per Session: Not on file  Stress:   . Feeling of Stress : Not on file  Social Connections:   . Frequency of Communication with Friends and Family: Not on file  . Frequency of Social Gatherings with Friends and Family: Not on file  . Attends Religious Services: Not on file  . Active Member of Clubs or Organizations: Not on file  . Attends Archivist Meetings: Not on file  . Marital Status: Not on file   Allergies  Allergen Reactions  . Montelukast Hives    Singular  . Depakote [Divalproex Sodium] Rash  . Invokana [Canagliflozin] Other (See Comments)    Balanitis   . Metformin And Related     Taken off in in early 2021 due to upset stomach.   . Atorvastatin Other (See Comments)    Muscle pain, neck stiffness  . Singulair [Montelukast Sodium] Hives   Family History  Problem Relation Age of Onset  . Hyperlipidemia Mother   . Heart attack Father   . Hyperlipidemia Father   . Diabetes Father   . Heart disease Father     Current Outpatient Medications (Endocrine & Metabolic):  .  glipiZIDE (GLIPIZIDE XL) 5 MG 24 hr tablet, Take 1 tablet (5 mg total) by mouth daily with breakfast. .  OZEMPIC, 0.25 OR 0.5 MG/DOSE, 2 MG/1.5ML SOPN, INJECT 0.5MG SUBQ ONCE A WEEK .  pioglitazone (ACTOS) 30 MG tablet, Take 1 tablet (30 mg total) by mouth daily.  Current Outpatient Medications (Cardiovascular):  .  nitroGLYCERIN (NITROSTAT) 0.4 MG SL tablet, Place 1 tablet (0.4 mg total) under the tongue every 5 (five) minutes as needed for chest pain. .  rosuvastatin (CRESTOR) 40 MG tablet, Take 1 tablet (40 mg total) by mouth daily.  Current Outpatient Medications (Respiratory):  .  albuterol (PROVENTIL HFA;VENTOLIN HFA) 108 (90 Base) MCG/ACT inhaler, Inhale 2 puffs into the lungs every 6 (six) hours as needed for  wheezing or shortness of breath. Marland Kitchen  azelastine (ASTELIN) 0.1 % nasal spray, Place 2 sprays into both nostrils 2 (two) times daily.  Current Outpatient Medications (Analgesics):  .  aspirin 81 MG tablet, Take 81 mg by mouth daily. .  celecoxib (CELEBREX) 100 MG capsule, Take 1 capsule (100 mg total) by mouth 2 (two) times daily. Marland Kitchen  oxyCODONE (ROXICODONE) 15 MG immediate release tablet, Take 1 tablet (15 mg total) by mouth every 4 (four) hours as needed for pain.   Current Outpatient Medications (Other):  .  azelastine (OPTIVAR) 0.05 % ophthalmic solution, drop affect eye twice a day .  Blood Glucose Monitoring Suppl (ONE TOUCH ULTRA 2) w/Device KIT, USE AS DIRECTED .  citalopram (CELEXA) 40 MG tablet, Take 1 tablet (40 mg total) by mouth daily. .  cyclobenzaprine (FLEXERIL) 5 MG tablet, TAKE ONE TABLET BY MOUTH THREE TIMES A DAY AS NEEDED FOR MUSCLE SPASMS .  glucose blood (ONE TOUCH ULTRA TEST) test strip, Check blood sugar 3 times daily E11.65 (uncontrolled insulin dependent diabetes) .  ketoconazole (NIZORAL) 2 % cream, Apply 1 application topically daily. .  mometasone (ELOCON) 0.1 % cream, One application daily as needed for rash .  RABEprazole (ACIPHEX) 20 MG tablet, TAKE ONE TABLET BY MOUTH DAILY IN PLACE OF DEXILANT .  RABEprazole (ACIPHEX) 20 MG tablet, Take 1 tablet (20 mg total) by mouth daily. Marland Kitchen  tiZANidine (ZANAFLEX) 4 MG capsule, Take 1 capsule (4 mg total) by mouth 2 (two) times daily as needed for muscle spasms. .  traZODone (DESYREL) 100 MG tablet, TAKE TWO TABLETS BY MOUTH EVERY NIGHT AT BEDTIME .  TRULANCE 3 MG TABS, TAKE ONE TABLET (3MG) BY MOUTH DAILY   Reviewed prior external information including notes and imaging from  primary care provider As well as notes that were available from care everywhere and other healthcare systems.  Past medical history, social, surgical and family history all reviewed in electronic medical record.  No pertanent information unless stated  regarding to the chief complaint.   Review of Systems:  No headache, visual changes, nausea, vomiting, diarrhea, constipation, dizziness, abdominal pain, skin rash, fevers, chills, night sweats, weight loss, swollen lymph nodes, body aches, joint swelling, chest pain, shortness of breath, mood changes. POSITIVE muscle aches  Objective  Blood pressure 140/80, pulse 61, height _0  (1.727 m), weight 220 lb (99.8 kg), SpO2 98 %.   General: No apparent distress alert and oriented x3 mood and affect normal, dressed appropriately.  Does appear uncomfortable with sitting HEENT: Pupils equal, extraocular movements intact  Respiratory: Patient's speak in full sentences and does not appear short of breath  Cardiovascular: No lower extremity edema, non tender, no erythema  Neuro: Cranial nerves II through XII are intact, neurovascularly intact in all extremities with 2+ DTRs and 2+ pulses.  Gait antalgic MSK: Significant stiffness noted in the paraspinal musculature of the lumbar spine.  Patient has difficulty with Corky Sox right greater than left.  Patient has significant tightness of the hamstring.  No true radicular symptoms.  Patient is diffusely tender to palpation in the soft tissue of the back noted.  Osteopathic findings L 3 flexed rotated and side bent right Sacrum left on the   Impression and Recommendations:     The above documentation has been reviewed and is accurate and complete Lyndal Pulley, DO

## 2020-01-15 NOTE — Assessment & Plan Note (Signed)
History of major depressive disorder.  Would like to consideration from primary care of switching citalopram to Cymbalta for potentially better pain relief.

## 2020-01-15 NOTE — Patient Instructions (Addendum)
Good to see you Labs today Will check wit PCP to change to cymbalta  PT will call you See me again in 4-6 weeks

## 2020-01-20 LAB — PTH, INTACT AND CALCIUM
Calcium: 9.3 mg/dL (ref 8.6–10.3)
PTH: 28 pg/mL (ref 14–64)

## 2020-01-20 LAB — ANGIOTENSIN CONVERTING ENZYME: Angiotensin-Converting Enzyme: 51 U/L (ref 9–67)

## 2020-01-20 LAB — ANA: Anti Nuclear Antibody (ANA): NEGATIVE

## 2020-01-20 LAB — CALCIUM, IONIZED: Calcium, Ion: 5.2 mg/dL (ref 4.8–5.6)

## 2020-01-20 LAB — CYCLIC CITRUL PEPTIDE ANTIBODY, IGG: Cyclic Citrullin Peptide Ab: <16 UNITS

## 2020-01-20 LAB — RHEUMATOID FACTOR: Rheumatoid fact SerPl-aCnc: <14 IU/mL (ref <14)

## 2020-01-23 ENCOUNTER — Ambulatory Visit: Payer: Managed Care, Other (non HMO) | Admitting: Cardiology

## 2020-01-23 ENCOUNTER — Other Ambulatory Visit: Payer: Self-pay | Admitting: Family Medicine

## 2020-01-23 DIAGNOSIS — G4700 Insomnia, unspecified: Secondary | ICD-10-CM

## 2020-01-26 ENCOUNTER — Encounter: Payer: Self-pay | Admitting: Cardiology

## 2020-02-05 ENCOUNTER — Other Ambulatory Visit: Payer: Self-pay | Admitting: Family Medicine

## 2020-02-05 DIAGNOSIS — R0789 Other chest pain: Secondary | ICD-10-CM

## 2020-02-05 DIAGNOSIS — G8929 Other chronic pain: Secondary | ICD-10-CM

## 2020-02-05 DIAGNOSIS — M545 Low back pain, unspecified: Secondary | ICD-10-CM

## 2020-02-05 NOTE — Telephone Encounter (Signed)
Requested medication (s) are due for refill today: yes  Requested medication (s) are on the active medication list:yes  Last refill: 01/06/2020  Future visit scheduled: no  Notes to clinic:  this refill cannot be delegated    Requested Prescriptions  Pending Prescriptions Disp Refills   oxyCODONE (ROXICODONE) 15 MG immediate release tablet 180 tablet 0    Sig: Take 1 tablet (15 mg total) by mouth every 4 (four) hours as needed for pain.      Not Delegated - Analgesics:  Opioid Agonists Failed - 02/05/2020 10:56 AM      Failed - This refill cannot be delegated      Failed - Urine Drug Screen completed in last 360 days.      Passed - Valid encounter within last 6 months    Recent Outpatient Visits           1 month ago Uncontrolled type 2 diabetes mellitus with hyperglycemia [facility])   [facility] Birdie Sons, MD   5 months ago Chronic bilateral low back pain with sciatica, sciatica laterality unspecified   [facility] Birdie Sons, MD   9 months ago Type 2 diabetes mellitus with other specified complication, without long-term current use of insulin [facility])   [facility] Birdie Sons, MD   1 year ago Chronic midline low back pain without sciatica   [facility] Birdie Sons, MD   1 year ago [place] Trinna Post, [place]       Future Appointments             In 1 week Lyndal Pulley, [place]

## 2020-02-05 NOTE — Telephone Encounter (Signed)
Medication Refill - Medication: oxyCODONE (ROXICODONE) 15 MG immediate release tablet    Has the patient contacted their pharmacy?yes (Agent: If no, request that the patient contact the pharmacy for the refill.) (Agent: If yes, when and what did the pharmacy advise?)Contact PCP  Preferred Pharmacy (with phone number or street name):  [place], [place] Phone:  [phone]  Fax:  [phone]       Agent: Please be advised that RX refills may take up to 3 business days. We ask that you follow-up with your pharmacy.

## 2020-02-06 MED ORDER — OXYCODONE HCL 15 MG PO TABS: 15.0000 mg | ORAL_TABLET | ORAL | 0 refills | Status: DC | PRN

## 2020-02-06 NOTE — Telephone Encounter (Signed)
Patient checking on the status of medication refill request stating he is completely out and would like rx sent over today  [place], [place] Phone:  [phone]  Fax:  [phone]

## 2020-02-09 ENCOUNTER — Telehealth: Payer: Self-pay

## 2020-02-09 ENCOUNTER — Encounter: Payer: Self-pay | Admitting: Intensive Care

## 2020-02-09 ENCOUNTER — Emergency Department
Admission: EM | Admit: 2020-02-09 | Discharge: 2020-02-09 | Disposition: A | Discharge status: Home / Self Care | Payer: Managed Care, Other (non HMO) | Attending: Student in an Organized Health Care Education/Training Program | Admitting: Student in an Organized Health Care Education/Training Program

## 2020-02-09 ENCOUNTER — Other Ambulatory Visit: Payer: Self-pay

## 2020-02-09 ENCOUNTER — Emergency Department: Payer: Managed Care, Other (non HMO)

## 2020-02-09 DIAGNOSIS — M549 Dorsalgia, unspecified: Secondary | ICD-10-CM | POA: Diagnosis present

## 2020-02-09 DIAGNOSIS — Z951 Presence of aortocoronary bypass graft: Secondary | ICD-10-CM | POA: Diagnosis not present

## 2020-02-09 DIAGNOSIS — Z86010 Personal history of colonic polyps: Secondary | ICD-10-CM | POA: Insufficient documentation

## 2020-02-09 DIAGNOSIS — Z87891 Personal history of nicotine dependence: Secondary | ICD-10-CM | POA: Diagnosis not present

## 2020-02-09 DIAGNOSIS — R109 Unspecified abdominal pain: Secondary | ICD-10-CM | POA: Insufficient documentation

## 2020-02-09 DIAGNOSIS — E1169 Type 2 diabetes mellitus with other specified complication: Secondary | ICD-10-CM | POA: Diagnosis not present

## 2020-02-09 DIAGNOSIS — F1120 Opioid dependence, uncomplicated: Secondary | ICD-10-CM | POA: Diagnosis not present

## 2020-02-09 DIAGNOSIS — Z79899 Other long term (current) drug therapy: Secondary | ICD-10-CM | POA: Diagnosis not present

## 2020-02-09 DIAGNOSIS — Z7982 Long term (current) use of aspirin: Secondary | ICD-10-CM | POA: Diagnosis not present

## 2020-02-09 DIAGNOSIS — I2510 Atherosclerotic heart disease of native coronary artery without angina pectoris: Secondary | ICD-10-CM | POA: Insufficient documentation

## 2020-02-09 DIAGNOSIS — F1110 Opioid abuse, uncomplicated: Secondary | ICD-10-CM | POA: Insufficient documentation

## 2020-02-09 DIAGNOSIS — Z7984 Long term (current) use of oral hypoglycemic drugs: Secondary | ICD-10-CM | POA: Insufficient documentation

## 2020-02-09 DIAGNOSIS — E782 Mixed hyperlipidemia: Secondary | ICD-10-CM | POA: Insufficient documentation

## 2020-02-09 DIAGNOSIS — Z955 Presence of coronary angioplasty implant and graft: Secondary | ICD-10-CM | POA: Insufficient documentation

## 2020-02-09 DIAGNOSIS — M545 Low back pain, unspecified: Secondary | ICD-10-CM | POA: Diagnosis not present

## 2020-02-09 DIAGNOSIS — G8929 Other chronic pain: Secondary | ICD-10-CM | POA: Diagnosis not present

## 2020-02-09 DIAGNOSIS — F4310 Post-traumatic stress disorder, unspecified: Secondary | ICD-10-CM | POA: Insufficient documentation

## 2020-02-09 LAB — COMPREHENSIVE METABOLIC PANEL
ALT: 25 U/L (ref 0–44)
AST: 26 U/L (ref 15–41)
Albumin: 5 g/dL (ref 3.5–5.0)
Alkaline Phosphatase: 59 U/L (ref 38–126)
Anion gap: 15 (ref 5–15)
BUN: 13 mg/dL (ref 6–20)
CO2: 20 mmol/L — ABNORMAL LOW (ref 22–32)
Calcium: 9.6 mg/dL (ref 8.9–10.3)
Chloride: 97 mmol/L — ABNORMAL LOW (ref 98–111)
Creatinine, Ser: 0.98 mg/dL (ref 0.61–1.24)
GFR, Estimated: >60 mL/min (ref >60)
Glucose, Bld: 266 mg/dL — ABNORMAL HIGH (ref 70–99)
Potassium: 3.8 mmol/L (ref 3.5–5.1)
Sodium: 132 mmol/L — ABNORMAL LOW (ref 135–145)
Total Bilirubin: 0.8 mg/dL (ref 0.3–1.2)
Total Protein: 7.9 g/dL (ref 6.5–8.1)

## 2020-02-09 LAB — LIPASE, BLOOD: Lipase: 36 U/L (ref 11–51)

## 2020-02-09 LAB — CBC
HCT: 43.8 % (ref 39.0–52.0)
Hemoglobin: 15 g/dL (ref 13.0–17.0)
MCH: 27.5 pg (ref 26.0–34.0)
MCHC: 34.2 g/dL (ref 30.0–36.0)
MCV: 80.2 fL (ref 80.0–100.0)
Platelets: 237 10*3/uL (ref 150–400)
RBC: 5.46 MIL/uL (ref 4.22–5.81)
RDW: 12.2 % (ref 11.5–15.5)
WBC: 10.3 10*3/uL (ref 4.0–10.5)
nRBC: 0 % (ref 0.0–0.2)

## 2020-02-09 MED ORDER — CLONIDINE HCL 0.1 MG PO TABS: 0.1000 mg | ORAL_TABLET | Freq: Two times a day (BID) | ORAL | 0 refills | Status: DC

## 2020-02-09 MED ORDER — ONDANSETRON 4 MG PO TBDP: 4.0000 mg | ORAL_TABLET | Freq: Once | ORAL | Status: DC | PRN

## 2020-02-09 MED ORDER — LIDOCAINE VISCOUS HCL 2 % MT SOLN
15.0000 mL | Freq: Once | OROMUCOSAL | Status: AC
  Administered 2020-02-09: 15 mL via ORAL
  Filled 2020-02-09: qty 15

## 2020-02-09 MED ORDER — PROMETHAZINE HCL 25 MG/ML IJ SOLN: 12.5000 mg | Freq: Four times a day (QID) | INTRAMUSCULAR | Status: DC | PRN

## 2020-02-09 MED ORDER — ALUM & MAG HYDROXIDE-SIMETH 200-200-20 MG/5ML PO SUSP
30.0000 mL | Freq: Once | ORAL | Status: AC
  Administered 2020-02-09: 30 mL via ORAL
  Filled 2020-02-09: qty 30

## 2020-02-09 MED ORDER — PROMETHAZINE HCL 25 MG PO TABS
25.0000 mg | ORAL_TABLET | Freq: Once | ORAL | Status: AC
  Administered 2020-02-09: 25 mg via ORAL
  Filled 2020-02-09: qty 1

## 2020-02-09 MED ORDER — PROMETHAZINE HCL 12.5 MG PO TABS: 12.5000 mg | ORAL_TABLET | Freq: Four times a day (QID) | ORAL | 0 refills | Status: DC | PRN

## 2020-02-09 MED ORDER — OXYCODONE HCL 5 MG PO TABS
15.0000 mg | ORAL_TABLET | Freq: Once | ORAL | Status: AC
  Administered 2020-02-09: 15 mg via ORAL
  Filled 2020-02-09: qty 3

## 2020-02-09 MED ORDER — ACETAMINOPHEN 500 MG PO TABS
1000.0000 mg | ORAL_TABLET | Freq: Once | ORAL | Status: AC
  Administered 2020-02-09: 1000 mg via ORAL
  Filled 2020-02-09: qty 2

## 2020-02-09 MED ORDER — CLONIDINE HCL 0.1 MG PO TABS
0.1000 mg | ORAL_TABLET | Freq: Once | ORAL | Status: AC
  Administered 2020-02-09: 0.1 mg via ORAL
  Filled 2020-02-09: qty 1

## 2020-02-09 NOTE — Telephone Encounter (Signed)
Called the patient's wife to let her know patient would have to wait until provider is back to fill early.  Otila Kluver (patient's wife) expressed understanding.  She requested him be placed into rehab to help him detox and get off of the controlled substances.  She stated she can't handle him at home when he runs out and wanted him to go straight from the ER to rehab.  She was advised to contact the social worker in the ER and they would help set something up.  If not we would be happy to have our Education officer, museum contact her.  She stated she would talk to the ER and call us back if she needed help.

## 2020-02-09 NOTE — ED Notes (Signed)
PT ambulatory in lobby.  Pt walked into carpeted foyer and laid on floor.  Pt assisted up to feet and ambulatory back to wheelchair.  Pt instructed to remain in wheelchair until room is available for him.  Pt verbalizes understanding of same.

## 2020-02-09 NOTE — Telephone Encounter (Signed)
i need help with this patient. His wife came by [facility] to say [facility] would not refill his Oxycodone on 02/06/20 because he was 8 days early. Pharmacist says he is notorious for getting this early and tries to say the pharmacy shorts him pills. When he transferred from [facility] to [employer] pharmacist at [facility] called and told HT to watch this guy because he was always getting refills early. Evidently he got pissed at [facility] and left them for a while also but is not back trying to get the Oxycodone.Otila Kluver, his wife, told me she had to call EMS this morning to get him to go to the ER for severe withdrawals. But she wants to know if we would refill his Oxycodone early for him. Dr. Caryn Section wont be back till tomorrow

## 2020-02-09 NOTE — ED Provider Notes (Signed)
[facility] Emergency Department Provider Note    First MD Initiated Contact with Patient 02/09/20 1136     (approximate)  I have reviewed the triage vital signs and the nursing notes.   HISTORY  Chief Complaint Abdominal Pain    HPI [name] is a 54 y.o. male bolus past medical history as well as what appears to be longstanding history of narcotic dependence presents to the ER for nausea vomiting worsening abdominal pain and back pain.  Has a history of chronic back pain.  States that he ran out of his oxycodone's early this is last dose was over 24 hours ago and feels like he is in withdrawal.  Denies any chest pain or shortness of breath.  No fevers.  Review of the record it appears that pharmacies have noted that this is a common occurrence with patient runs out of his medications early comes in requesting refills.  Daughter at bedside states that patient has a long history of substance abuse and narcotic dependence.  He denies any SI or HI.  Does not feel depressed.  Does not feel anxious.    Past Medical History:  Diagnosis Date  . Anxiety   . Asthma   . Bipolar 2 disorder ([place])   . Coronary artery disease   . Diabetes ([place])   . Fatty liver   . History of MI (myocardial infarction)   . STEMI (ST elevation myocardial infarction) ([place]) 06/22/2013   Family History  Problem Relation Age of Onset  . Hyperlipidemia Mother   . Heart attack Father   . Hyperlipidemia Father   . Diabetes Father   . Heart disease Father    Past Surgical History:  Procedure Laterality Date  . APPENDECTOMY    . CARDIAC CATHETERIZATION  06/24/2013  . COLONOSCOPY WITH PROPOFOL N/A 09/23/2014   Procedure: COLONOSCOPY WITH PROPOFOL;  Surgeon: Lucilla Lame, MD;  Location: [place];  Service: Endoscopy;  Laterality: N/A;  . COLONOSCOPY WITH PROPOFOL N/A 10/30/2018   Procedure: COLONOSCOPY WITH PROPOFOL;  Surgeon: Virgel Manifold, MD;  Location: [facility]  ENDOSCOPY;  Service: Endoscopy;  Laterality: N/A;  . CORONARY ANGIOPLASTY     stent placement to LAD  . CORONARY ARTERY BYPASS GRAFT  07/31/2013   x 3 vessels  . ESOPHAGOGASTRODUODENOSCOPY (EGD) WITH PROPOFOL N/A 10/30/2018   Procedure: ESOPHAGOGASTRODUODENOSCOPY (EGD) WITH PROPOFOL;  Surgeon: Virgel Manifold, MD;  Location: [facility] ENDOSCOPY;  Service: Endoscopy;  Laterality: N/A;  . FINGER SURGERY    . NASAL SINUS SURGERY    . POLYPECTOMY  09/23/2014   Procedure: POLYPECTOMY;  Surgeon: Lucilla Lame, MD;  Location: [place];  Service: Endoscopy;;   Patient Active Problem List   Diagnosis Date Noted  . Nonallopathic lesion of sacral region 01/15/2020  . Nonallopathic lesion of lumbar region 01/15/2020  . Chronic bilateral low back pain with sciatica 09/05/2019  . Stomach irritation   . History of colonic polyps   . Colonic edema   . Internal hemorrhoids   . Pulmonary nodule 10/03/2018  . Type 2 diabetes mellitus with other specified complication ([place]) 69/67/8938  . Fatty liver 03/28/2018  . Left hip pain 03/26/2018  . Chronic left SI joint pain 03/26/2018  . Hx of CABG (chronic thoracic/chest wall pain) 03/26/2018  . Depression 01/25/2018  . Suicide attempt ([place]) 01/08/2018  . Benzodiazepine overdose 01/08/2018  . Severe recurrent major depression without psychotic features ([place]) 01/08/2018  . Chronic pain 01/08/2018  . Thrush, oral 12/03/2017  .  Current moderate episode of major depressive disorder without prior episode ([place]) 07/26/2017  . Anxiety 10/28/2014  . Arthralgia 10/28/2014  . Chest wall pain 10/28/2014  . Dysphagia 10/28/2014  . Fatigue 10/28/2014  . History of tobacco use 10/28/2014  . Hyperlipidemia, mixed 10/28/2014  . Insomnia 10/28/2014  . Chronic idiopathic constipation 09/18/2014  . Esophageal reflux 09/18/2014  . Esophageal spasm 09/18/2014  . Coronary artery disease 07/07/2013  . Allergic rhinitis 06/18/2008      Prior to Admission  medications   Medication Sig Start Date End Date Taking? Authorizing Provider  albuterol (PROVENTIL HFA;VENTOLIN HFA) 108 (90 Base) MCG/ACT inhaler Inhale 2 puffs into the lungs every 6 (six) hours as needed for wheezing or shortness of breath. 03/25/18   Birdie Sons, MD  aspirin 81 MG tablet Take 81 mg by mouth daily.    [provider]  azelastine (ASTELIN) 0.1 % nasal spray Place 2 sprays into both nostrils 2 (two) times daily. 04/15/19   Birdie Sons, MD  azelastine (OPTIVAR) 0.05 % ophthalmic solution drop affect eye twice a day 04/15/19   Birdie Sons, MD  Blood Glucose Monitoring Suppl (ONE TOUCH ULTRA 2) w/Device KIT USE AS DIRECTED 05/16/18   Birdie Sons, MD  celecoxib (CELEBREX) 100 MG capsule Take 1 capsule (100 mg total) by mouth 2 (two) times daily. 01/06/20   Birdie Sons, MD  citalopram (CELEXA) 40 MG tablet Take 1 tablet (40 mg total) by mouth daily. 09/05/19   Birdie Sons, MD  cloNIDine (CATAPRES) 0.1 MG tablet Take 1 tablet (0.1 mg total) by mouth 2 (two) times daily for 3 days. 02/09/20 02/12/20  Merlyn Lot, MD  cyclobenzaprine (FLEXERIL) 5 MG tablet TAKE ONE TABLET BY MOUTH THREE TIMES A DAY AS NEEDED FOR MUSCLE SPASMS 09/05/19   Birdie Sons, MD  glipiZIDE (GLIPIZIDE XL) 5 MG 24 hr tablet Take 1 tablet (5 mg total) by mouth daily with breakfast. 04/15/19   Birdie Sons, MD  glucose blood (ONE TOUCH ULTRA TEST) test strip Check blood sugar 3 times daily E11.65 (uncontrolled insulin dependent diabetes) 04/19/18   Birdie Sons, MD  ketoconazole (NIZORAL) 2 % cream Apply 1 application topically daily. 04/30/18   Birdie Sons, MD  mometasone (ELOCON) 0.1 % cream One application daily as needed for rash 11/16/17   Carmon Ginsberg, PA  nitroGLYCERIN (NITROSTAT) 0.4 MG SL tablet Place 1 tablet (0.4 mg total) under the tongue every 5 (five) minutes as needed for chest pain. 05/09/18   Birdie Sons, MD  oxyCODONE (ROXICODONE) 15 MG immediate  release tablet Take 1 tablet (15 mg total) by mouth every 4 (four) hours as needed for pain. 02/06/20   Birdie Sons, MD  OZEMPIC, 0.25 OR 0.5 MG/DOSE, 2 MG/1.5ML SOPN INJECT 0.5MG SUBQ ONCE A WEEK 01/14/20   Birdie Sons, MD  pioglitazone (ACTOS) 30 MG tablet Take 1 tablet (30 mg total) by mouth daily. 01/06/20   Birdie Sons, MD  promethazine (PHENERGAN) 12.5 MG tablet Take 1 tablet (12.5 mg total) by mouth every 6 (six) hours as needed. 02/09/20   Merlyn Lot, MD  RABEprazole (ACIPHEX) 20 MG tablet TAKE ONE TABLET BY MOUTH DAILY IN PLACE OF DEXILANT 09/09/19   Birdie Sons, MD  RABEprazole (ACIPHEX) 20 MG tablet Take 1 tablet (20 mg total) by mouth daily. 09/05/19   Birdie Sons, MD  rosuvastatin (CRESTOR) 40 MG tablet Take 1 tablet (40 mg total) by mouth  daily. 10/09/19   Birdie Sons, MD  tiZANidine (ZANAFLEX) 4 MG capsule Take 1 capsule (4 mg total) by mouth 2 (two) times daily as needed for muscle spasms. 03/26/18   Gillis Santa, MD  traZODone (DESYREL) 100 MG tablet TAKE 2 TABLETS BY MOUTH AT BEDTIME 01/23/20   Birdie Sons, MD  TRULANCE 3 MG TABS TAKE ONE TABLET (3MG) BY MOUTH DAILY 08/08/19   Birdie Sons, MD    Allergies Montelukast, Depakote [divalproex sodium], Invokana [canagliflozin], Metformin and related, Atorvastatin, and Singulair [montelukast sodium]    Social History Social History   Tobacco Use  . Smoking status: Former Smoker    Packs/day: 1.50    Years: 30.00    Pack years: 45.00    Types: Cigarettes    Quit date: 08/08/2013    Years since quitting: 6.5  . Smokeless tobacco: Never Used  Vaping Use  . Vaping Use: Never used  Substance Use Topics  . Alcohol use: No  . Drug use: Never    Review of Systems Patient denies headaches, rhinorrhea, blurry vision, numbness, shortness of breath, chest pain, edema, cough, abdominal pain, nausea, vomiting, diarrhea, dysuria, fevers, rashes or hallucinations unless otherwise stated above in  HPI. ____________________________________________   PHYSICAL EXAM:  VITAL SIGNS: Vitals:   02/09/20 1300 02/09/20 1330  BP: (!) 168/87 (!) 177/91  Pulse: 76 87  Resp: 18 18  Temp:    SpO2: 98% 99%    Constitutional: Alert and oriented. Non toxic appearing Eyes: Conjunctivae are normal.  Head: Atraumatic. Nose: No congestion/rhinnorhea. Mouth/Throat: Mucous membranes are moist.   Neck: No stridor. Painless ROM.  Cardiovascular: Normal rate, regular rhythm. Grossly normal heart sounds.  Good peripheral circulation. Respiratory: Normal respiratory effort.  No retractions. Lungs CTAB. Gastrointestinal: Soft and nontender in all four quadrants. No distention. No abdominal bruits. No CVA tenderness. Genitourinary:  Musculoskeletal: No lower extremity tenderness nor edema.  No joint effusions. Neurologic:  Normal speech and language. No gross focal neurologic deficits are appreciated. No facial droop Skin:  Skin is warm, dry and intact. No rash noted. Psychiatric: calm, cooperative ____________________________________________   LABS (all labs ordered are listed, but only abnormal results are displayed)  Results for orders placed or performed during the hospital encounter of 02/09/20 (from the past 24 hour(s))  Lipase, blood     Status: None   Collection Time: 02/09/20  8:59 AM  Result Value Ref Range   Lipase 36 11 - 51 U/L  Comprehensive metabolic panel     Status: Abnormal   Collection Time: 02/09/20  8:59 AM  Result Value Ref Range   Sodium 132 (L) 135 - 145 mmol/L   Potassium 3.8 3.5 - 5.1 mmol/L   Chloride 97 (L) 98 - 111 mmol/L   CO2 20 (L) 22 - 32 mmol/L   Glucose, Bld 266 (H) 70 - 99 mg/dL   BUN 13 6 - 20 mg/dL   Creatinine, Ser 0.98 0.61 - 1.24 mg/dL   Calcium 9.6 8.9 - 10.3 mg/dL   Total Protein 7.9 6.5 - 8.1 g/dL   Albumin 5.0 3.5 - 5.0 g/dL   AST 26 15 - 41 U/L   ALT 25 0 - 44 U/L   Alkaline Phosphatase 59 38 - 126 U/L   Total Bilirubin 0.8 0.3 - 1.2 mg/dL    GFR, Estimated >60 >60 mL/min   Anion gap 15 5 - 15  CBC     Status: None   Collection Time: 02/09/20  8:59  AM  Result Value Ref Range   WBC 10.3 4.0 - 10.5 K/uL   RBC 5.46 4.22 - 5.81 MIL/uL   Hemoglobin 15.0 13.0 - 17.0 g/dL   HCT 43.8 39 - 52 %   MCV 80.2 80.0 - 100.0 fL   MCH 27.5 26.0 - 34.0 pg   MCHC 34.2 30.0 - 36.0 g/dL   RDW 12.2 11.5 - 15.5 %   Platelets 237 150 - 400 K/uL   nRBC 0.0 0.0 - 0.2 %   ____________________________________________  EKG My review and personal interpretation at Time: 8:37   Indication: chest pain  Rate: 75  Rhythm: sinus Axis: normal Other: normal intervals, no stemi ____________________________________________  RADIOLOGY  I personally reviewed all radiographic images ordered to evaluate for the above acute complaints and reviewed radiology reports and findings.  These findings were personally discussed with the patient.  Please see medical record for radiology report.  ____________________________________________   PROCEDURES  Procedure(s) performed:  Procedures    Critical Care performed: no ____________________________________________   INITIAL IMPRESSION / ASSESSMENT AND PLAN / ED COURSE  Pertinent labs & imaging results that were available during my care of the patient were reviewed by me and considered in my medical decision making (see chart for details).   DDX: withdrawal, gastritis, enteritis, constipation, sbo  Shykeem N Viney is a 54 y.o. who presents to the ED with history and presentation as described above.  Patient uncomfortable appearing and I suspect he is in some form of mild withdrawal.  We will check blood work.  States he was feeling constipated but did move his bowels therefore have a lower suspicion for SBO.  X-ray without any evidence of obstructive pattern.  Clinical Course as of Feb 08 1425  Mon Feb 09, 2020  1424 Patient complains of additional back pain requesting additional narcotic pain  medication.  After review of his history and presentation I do suspect this is acute narcotic withdrawal in the setting of a history of extensive narcotic dependence and substance abuse history.  I informed her that was not providing additional narcotic pain medication for his chronic pain.  He is tolerating p.o.  His blood work is otherwise reassuring.  Did contact TTS who has spoken with family at bedside and provided resources for narcotic dependency detox.  Do not see indication for admission to the hospital at this time.  Will be discharged with symptomatic management and referral to detox.   [PR]    Clinical Course User Index [PR] Merlyn Lot, MD    The patient was evaluated in Emergency Department today for the symptoms described in the history of present illness. He/she was evaluated in the context of the global COVID-19 pandemic, which necessitated consideration that the patient might be at risk for infection with the SARS-CoV-2 virus that causes COVID-19. Institutional protocols and algorithms that pertain to the evaluation of patients at risk for COVID-19 are in a state of rapid change based on information released by regulatory bodies including the CDC and federal and state organizations. These policies and algorithms were followed during the patient's care in the ED.  As part of my medical decision making, I reviewed the following data within the [place] notes reviewed and incorporated, Labs reviewed, notes from prior ED visits and [place] Controlled Substance Database   ____________________________________________   FINAL CLINICAL IMPRESSION(S) / ED DIAGNOSES  Final diagnoses:  Chronic low back pain, unspecified back pain laterality, unspecified whether sciatica present  Narcotic dependence (  [place])      NEW MEDICATIONS STARTED DURING THIS VISIT:  New Prescriptions   CLONIDINE (CATAPRES) 0.1 MG TABLET    Take 1 tablet (0.1 mg total) by mouth 2 (two)  times daily for 3 days.   PROMETHAZINE (PHENERGAN) 12.5 MG TABLET    Take 1 tablet (12.5 mg total) by mouth every 6 (six) hours as needed.     Note:  This document was prepared using Dragon voice recognition software and may include unintentional dictation errors.    Merlyn Lot, MD 02/09/20 1426

## 2020-02-09 NOTE — ED Notes (Signed)
Pt states that the pain was getting better after the GI cocktail, but now is returning. MD made aware.

## 2020-02-09 NOTE — ED Notes (Signed)
Pt to ER via EMS for "overdose".  Pt ran out of chronic use oxycodone several days ago and was refused a refill.  Per wife pt has been taking "several of each every hour - amytriptaline, hydroxyzine, metformin, tylenol and statin".  Per EMS CBG 237, pt c/o abdominal pain and vomiting.

## 2020-02-09 NOTE — ED Triage Notes (Signed)
Patient c/o abdominal pain and lower back pain that started last night. Takes oxy for chronic back pain. Ran out the last few days. Reports no BM X1 day. Pt appears very uncomfortable in triage. Moving from bench to floor and pacing

## 2020-02-09 NOTE — Telephone Encounter (Signed)
I have let Vincent Hall know that I will not refill early.  The ER can decide what they feel is appropriate for possible withdrawal symptoms.  Given that early refills seem to be a recurrent issue with this patient, I believe that he needs to speak to his PCP about this.  Controlled substance contracts are an expectation for chronic opioids in any patient.  These stimulate that patient needs to stick with the same pharmacy for refills and that no early refills will be given.  Would recommend that PCP discusses and completes 1 of these with the patient if he has not already.  If he does already have one on file, then we should not be giving early refills anyway.

## 2020-02-10 DIAGNOSIS — R45850 Homicidal ideations: Secondary | ICD-10-CM | POA: Insufficient documentation

## 2020-02-10 NOTE — Telephone Encounter (Signed)
Patient has been admitted.

## 2020-02-10 NOTE — Telephone Encounter (Signed)
Please review. Thanks!  

## 2020-02-12 ENCOUNTER — Ambulatory Visit: Payer: Managed Care, Other (non HMO) | Admitting: Family Medicine

## 2020-02-12 NOTE — Progress Notes (Deleted)
[place] Phone: [phone] Subjective:    I'm seeing this patient by the request  of:  Birdie Sons, MD  CC: Back pain follow-up  XAJ:[id number]   01/15/2020 History of major depressive disorder.  Would like to consideration from primary care of switching citalopram to Cymbalta for potentially better pain relief.  Attempted osteopathic manipulation with very minimal benefit.  We will see how patient responds but we will get laboratory work-up to make sure nothing else is being missed.  Patient has had chronic back pain for quite some time.  Known degenerative disc disease.  Patient states that he has had advanced imaging as well as nerve conduction test.  I was able to find the most recent nerve conduction test that was unremarkable.  Patient has had many other comorbidities that could also be contributing to this.  Patient has had some difficulty with his diabetes recently as well as cardiac issues history of CABG and continued pain.  History of major depressive disorder and is on Celexa.  I would like to check with primary care provider to see if we can transition patient from citalopram to Cymbalta for more pain relief.  Laboratory work-up ordered today to further evaluate for such things as autoimmune that could be contributing.  Patient will start with formal physical therapy which he has not done in the past he states.  Follow-up with me again in 4 to 6 weeks.  Attempted very mild osteopathic manipulation of more muscle energy and we will see how patient responds.  Update 02/12/2020 JAYCEN VERCHER is a 54 y.o. male coming in with complaint of low back pain. Patient was seen in ED on 02/09/2020 for an increase in back pain due to running out of pain mediation.  While patient was in the emergency room was found to have hyperglycemia as well as hyponatremia minorly     Patient's previous imaging included an MRI in  2019 of the lumbar spine showed a very small L3-L4 disc extrusion but otherwise completely unremarkable  Prior laboratory work-up did show that patient did have vitamin D deficiency  Past Medical History:  Diagnosis Date  . Anxiety   . Asthma   . Bipolar 2 disorder ([place])   . Coronary artery disease   . Diabetes ([place])   . Fatty liver   . History of MI (myocardial infarction)   . STEMI (ST elevation myocardial infarction) ([place]) 06/22/2013   Past Surgical History:  Procedure Laterality Date  . APPENDECTOMY    . CARDIAC CATHETERIZATION  06/24/2013  . COLONOSCOPY WITH PROPOFOL N/A 09/23/2014   Procedure: COLONOSCOPY WITH PROPOFOL;  Surgeon: Lucilla Lame, MD;  Location: [facility];  Service: Endoscopy;  Laterality: N/A;  . COLONOSCOPY WITH PROPOFOL N/A 10/30/2018   Procedure: COLONOSCOPY WITH PROPOFOL;  Surgeon: Virgel Manifold, MD;  Location: [facility] END[facility]COPY;  Service: Endoscopy;  Laterality: N/A;  . CORONARY ANGIOPLASTY     stent placement to LAD  . CORONARY ARTERY BYPASS GRAFT  07/31/2013   x 3 vessels  . ESOPHAGOGASTRODUODENOSCOPY (EGD) WITH PROPOFOL N/A 10/30/2018   Procedure: ESOPHAGOGASTRODUODENOSCOPY (EGD) WITH PROPOFOL;  Surgeon: Virgel Manifold, MD;  Location: [facility] ENDOSCOPY;  Service: Endoscopy;  Laterality: N/A;  . FINGER SURGERY    . NASAL SINUS SURGERY    . POLYPECTOMY  09/23/2014   Procedure: POLYPECTOMY;  Surgeon: Lucilla Lame, MD;  Location: [facility];  Service: Endoscopy;;   Social History  Socioeconomic History  . Marital status: Married    Spouse name: clementin  . Number of children: 2  . Years of education: HS Grad  . Highest education level: Associate degree: occupational, Hotel manager, or vocational program  Occupational History  . Occupation: Employeed  Tobacco Use  . Smoking status: Former Smoker    Packs/day: 1.50    Years: 30.00    Pack years: 45.00    Types: Cigarettes    Quit date: 08/08/2013    Years since quitting: 6.5  .  Smokeless tobacco: Never Used  Vaping Use  . Vaping Use: Never used  Substance and Sexual Activity  . Alcohol use: No  . Drug use: Never  . Sexual activity: Not Currently  Other Topics Concern  . Not on file  Social History Narrative  . Not on file   Social Determinants of Health   Financial Resource Strain:   . Difficulty of Paying Living Expenses: Not on file  Food Insecurity:   . Worried About Charity fundraiser in the Last Year: Not on file  . Ran Out of Food in the Last Year: Not on file  Transportation Needs:   . Lack of Transportation (Medical): Not on file  . Lack of Transportation (Non-Medical): Not on file  Physical Activity:   . Days of Exercise per Week: Not on file  . Minutes of Exercise per Session: Not on file  Stress:   . Feeling of Stress : Not on file  Social Connections:   . Frequency of Communication with Friends and Family: Not on file  . Frequency of Social Gatherings with Friends and Family: Not on file  . Attends Religious Services: Not on file  . Active Member of Clubs or Organizations: Not on file  . Attends Archivist Meetings: Not on file  . Marital Status: Not on file   Allergies  Allergen Reactions  . Montelukast Hives    Singular  . Depakote [Divalproex Sodium] Rash  . Invokana [Canagliflozin] Other (See Comments)    Balanitis   . Metformin And Related     Taken off in in early 2021 due to upset stomach.   . Atorvastatin Other (See Comments)    Muscle pain, neck stiffness  . Singulair [Montelukast Sodium] Hives   Family History  Problem Relation Age of Onset  . Hyperlipidemia Mother   . Heart attack Father   . Hyperlipidemia Father   . Diabetes Father   . Heart disease Father     Current Outpatient Medications (Endocrine & Metabolic):  .  glipiZIDE (GLIPIZIDE XL) 5 MG 24 hr tablet, Take 1 tablet (5 mg total) by mouth daily with breakfast. .  OZEMPIC, 0.25 OR 0.5 MG/DOSE, 2 MG/1.5ML SOPN, INJECT 0.5MG SUBQ ONCE A  WEEK .  pioglitazone (ACTOS) 30 MG tablet, Take 1 tablet (30 mg total) by mouth daily.  Current Outpatient Medications (Cardiovascular):  .  cloNIDine (CATAPRES) 0.1 MG tablet, Take 1 tablet (0.1 mg total) by mouth 2 (two) times daily for 3 days. .  nitroGLYCERIN (NITROSTAT) 0.4 MG SL tablet, Place 1 tablet (0.4 mg total) under the tongue every 5 (five) minutes as needed for chest pain. .  rosuvastatin (CRESTOR) 40 MG tablet, Take 1 tablet (40 mg total) by mouth daily.  Current Outpatient Medications (Respiratory):  .  albuterol (PROVENTIL HFA;VENTOLIN HFA) 108 (90 Base) MCG/ACT inhaler, Inhale 2 puffs into the lungs every 6 (six) hours as needed for wheezing or shortness of breath. Marland Kitchen  azelastine (  ASTELIN) 0.1 % nasal spray, Place 2 sprays into both nostrils 2 (two) times daily. .  promethazine (PHENERGAN) 12.5 MG tablet, Take 1 tablet (12.5 mg total) by mouth every 6 (six) hours as needed.  Current Outpatient Medications (Analgesics):  .  aspirin 81 MG tablet, Take 81 mg by mouth daily. .  celecoxib (CELEBREX) 100 MG capsule, Take 1 capsule (100 mg total) by mouth 2 (two) times daily. Marland Kitchen  oxyCODONE (ROXICODONE) 15 MG immediate release tablet, Take 1 tablet (15 mg total) by mouth every 4 (four) hours as needed for pain.   Current Outpatient Medications (Other):  .  azelastine (OPTIVAR) 0.05 % ophthalmic solution, drop affect eye twice a day .  Blood Glucose Monitoring Suppl (ONE TOUCH ULTRA 2) w/Device KIT, USE AS DIRECTED .  citalopram (CELEXA) 40 MG tablet, Take 1 tablet (40 mg total) by mouth daily. .  cyclobenzaprine (FLEXERIL) 5 MG tablet, TAKE ONE TABLET BY MOUTH THREE TIMES A DAY AS NEEDED FOR MUSCLE SPASMS .  glucose blood (ONE TOUCH ULTRA TEST) test strip, Check blood sugar 3 times daily E11.65 (uncontrolled insulin dependent diabetes) .  ketoconazole (NIZORAL) 2 % cream, Apply 1 application topically daily. .  mometasone (ELOCON) 0.1 % cream, One application daily as needed for  rash .  RABEprazole (ACIPHEX) 20 MG tablet, TAKE ONE TABLET BY MOUTH DAILY IN PLACE OF DEXILANT .  RABEprazole (ACIPHEX) 20 MG tablet, Take 1 tablet (20 mg total) by mouth daily. Marland Kitchen  tiZANidine (ZANAFLEX) 4 MG capsule, Take 1 capsule (4 mg total) by mouth 2 (two) times daily as needed for muscle spasms. .  traZODone (DESYREL) 100 MG tablet, TAKE 2 TABLETS BY MOUTH AT BEDTIME .  TRULANCE 3 MG TABS, TAKE ONE TABLET (3MG) BY MOUTH DAILY   Reviewed prior external information including notes and imaging from  primary care provider As well as notes that were available from care everywhere and other healthcare systems.  Past medical history, social, surgical and family history all reviewed in electronic medical record.  No pertanent information unless stated regarding to the chief complaint.   Review of Systems:  No headache, visual changes, nausea, vomiting, diarrhea, constipation, dizziness, abdominal pain, skin rash, fevers, chills, night sweats, weight loss, swollen lymph nodes, body aches, joint swelling, chest pain, shortness of breath, mood changes. POSITIVE muscle aches  Objective  There were no vitals taken for this visit.   General: No apparent distress alert and oriented x3 mood and affect normal, dressed appropriately.  HEENT: Pupils equal, extraocular movements intact  Respiratory: Patient's speak in full sentences and does not appear short of breath  Cardiovascular: No lower extremity edema, non tender, no erythema  Neuro: Cranial nerves II through XII are intact, neurovascularly intact in all extremities with 2+ DTRs and 2+ pulses.  Gait normal with good balance and coordination.  MSK:  Non tender with full range of motion and good stability and symmetric strength and tone of shoulders, elbows, wrist, hip, knee and ankles bilaterally.     Impression and Recommendations:     The above documentation has been reviewed and is accurate and complete Lyndal Pulley, DO

## 2020-03-17 ENCOUNTER — Other Ambulatory Visit: Payer: Self-pay | Admitting: Family Medicine

## 2020-03-17 DIAGNOSIS — G8929 Other chronic pain: Secondary | ICD-10-CM

## 2020-03-17 DIAGNOSIS — R0789 Other chest pain: Secondary | ICD-10-CM

## 2020-03-17 MED ORDER — OXYCODONE HCL 15 MG PO TABS: 15.0000 mg | ORAL_TABLET | ORAL | 0 refills | Status: DC | PRN

## 2020-03-17 NOTE — Telephone Encounter (Signed)
Please review for refill. Refill not delegated per protocol. Last refill: 02/06/20 #180

## 2020-03-17 NOTE — Telephone Encounter (Signed)
Medication Refill - Medication: oxyCODONE (ROXICODONE) 15 MG immediate release tablet     Preferred Pharmacy (with phone number or street name):  [place], [place]. Phone:  [phone]  Fax:  [phone]       Agent: Please be advised that RX refills may take up to 3 business days. We ask that you follow-up with your pharmacy.

## 2020-03-18 ENCOUNTER — Other Ambulatory Visit: Payer: Self-pay | Admitting: Family Medicine

## 2020-03-20 ENCOUNTER — Other Ambulatory Visit: Payer: Self-pay | Admitting: Family Medicine

## 2020-03-20 DIAGNOSIS — E1169 Type 2 diabetes mellitus with other specified complication: Secondary | ICD-10-CM

## 2020-03-20 NOTE — Telephone Encounter (Signed)
Requested Prescriptions  Pending Prescriptions Disp Refills  . glipiZIDE (GLUCOTROL XL) 5 MG 24 hr tablet [Pharmacy Med Name: GLIPIZIDE ER 5 MG TAB] 90 tablet 0    Sig: TAKE 1 TABLET BY MOUTH ONCE DAILY WITH BREAKFAST     Endocrinology:  Diabetes - Sulfonylureas Failed - 03/20/2020  9:45 AM      Failed - HBA1C is between 0 and 7.9 and within 180 days    Hemoglobin A1C  Date Value Ref Range Status  01/06/2020 8.0 (A) 4.0 - 5.6 % Final   Hgb A1c MFr Bld  Date Value Ref Range Status  03/25/2018 7.9 (H) 4.8 - 5.6 % Final    Comment:             Prediabetes: 5.7 - 6.4          Diabetes: >6.4          Glycemic control for adults with diabetes: <7.0          Passed - Valid encounter within last 6 months    Recent Outpatient Visits          2 months ago Uncontrolled type 2 diabetes mellitus with hyperglycemia [facility])   [facility] Birdie Sons, MD   6 months ago Chronic bilateral low back pain with sciatica, sciatica laterality unspecified   [facility] Birdie Sons, MD   11 months ago Type 2 diabetes mellitus with other specified complication, without long-term current use of insulin [facility])   [facility] Birdie Sons, MD   1 year ago Chronic midline low back pain without sciatica   [facility] Birdie Sons, MD   1 year ago [place], Fabio Bering [place], [place]

## 2020-03-29 ENCOUNTER — Other Ambulatory Visit: Payer: Self-pay | Admitting: Family Medicine

## 2020-03-29 DIAGNOSIS — G8929 Other chronic pain: Secondary | ICD-10-CM

## 2020-03-29 DIAGNOSIS — M545 Low back pain, unspecified: Secondary | ICD-10-CM

## 2020-04-16 ENCOUNTER — Telehealth: Payer: Self-pay

## 2020-04-16 ENCOUNTER — Other Ambulatory Visit: Payer: Self-pay | Admitting: Family Medicine

## 2020-04-16 DIAGNOSIS — E1169 Type 2 diabetes mellitus with other specified complication: Secondary | ICD-10-CM

## 2020-04-16 MED ORDER — HUMALOG KWIKPEN 200 UNIT/ML ~~LOC~~ SOPN: PEN_INJECTOR | SUBCUTANEOUS | 0 refills | Status: DC

## 2020-04-16 MED ORDER — HUMALOG KWIKPEN 200 UNIT/ML ~~LOC~~ SOPN
PEN_INJECTOR | SUBCUTANEOUS | 0 refills | Status: DC
Start: 2020-04-16 — End: 2021-09-23

## 2020-04-16 NOTE — Telephone Encounter (Signed)
Copied from [place] [id number]. Topic: General - Inquiry >> Apr 16, 2020 11:46 AM Greggory Keen D wrote: Reason for CRM: Pt called asking for samples of Humalog.  Pt is out of the samples as of today.  His glucose is 300.  After using the last one today  CB#   [phone]

## 2020-04-16 NOTE — Telephone Encounter (Signed)
He can have one sample pen of the humalog U-200.

## 2020-04-19 NOTE — Telephone Encounter (Signed)
Since were giving 100U instead of 200 will sig still be same as before? KW

## 2020-04-19 NOTE — Telephone Encounter (Signed)
[id number] 

## 2020-04-19 NOTE — Telephone Encounter (Signed)
Dr. Caryn Section, I dont see any samples of the Humalog U-200 in the refrigerator. We have 2 sample pens of the Humalog U-100.

## 2020-04-19 NOTE — Telephone Encounter (Signed)
Ok, he can have one of the u-100s

## 2020-04-19 NOTE — Telephone Encounter (Signed)
Yes, the pen automatically adjusts the volume delivered for whichever concentration of insulin is in it.

## 2020-04-21 ENCOUNTER — Other Ambulatory Visit: Payer: Self-pay | Admitting: Family Medicine

## 2020-04-21 DIAGNOSIS — E1165 Type 2 diabetes mellitus with hyperglycemia: Secondary | ICD-10-CM

## 2020-04-21 NOTE — Telephone Encounter (Signed)
Requested medication (s) are due for refill today - yes  Requested medication (s) are on the active medication list -yes  Future visit scheduled -yes  Last refill: 03/18/20  Notes to clinic: Last visit notes increase in dosage- 30 mg- can not find where dosage was lowered- sent for review   Requested Prescriptions  Pending Prescriptions Disp Refills   pioglitazone (ACTOS) 15 MG tablet [Pharmacy Med Name: PIOGLITAZONE HCL 15 MG TAB] 30 tablet 0    Sig: TAKE 1 TABLET BY MOUTH ONCE DAILY      Endocrinology:  Diabetes - Glitazones - pioglitazone Failed - 04/21/2020  3:50 PM      Failed - HBA1C is between 0 and 7.9 and within 180 days    Hemoglobin A1C  Date Value Ref Range Status  01/06/2020 8.0 (A) 4.0 - 5.6 % Final   Hgb A1c MFr Bld  Date Value Ref Range Status  03/25/2018 7.9 (H) 4.8 - 5.6 % Final    Comment:             Prediabetes: 5.7 - 6.4          Diabetes: >6.4          Glycemic control for adults with diabetes: <7.0           Passed - Valid encounter within last 6 months    Recent Outpatient Visits           3 months ago Uncontrolled type 2 diabetes mellitus with hyperglycemia ([place])   [facility] Birdie Sons, MD   7 months ago Chronic bilateral low back pain with sciatica, sciatica laterality unspecified   [facility] Birdie Sons, MD   1 year ago Type 2 diabetes mellitus with other specified complication, without long-term current use of insulin ([place])   [facility] Birdie Sons, MD   1 year ago Chronic midline low back pain without sciatica   [facility] Birdie Sons, MD   1 year ago [place], Wendee [place], [place]       Future Appointments             In 2 weeks Caryn Section, Kirstie Peri, MD [facility], [facility]              Signed Prescriptions Disp Refills   RABEprazole (ACIPHEX) 20 MG tablet 30 tablet 0    Sig: TAKE 1 TABLET BY MOUTH  ONCE DAILY      Gastroenterology: Proton Pump Inhibitors Passed - 04/21/2020  3:50 PM      Passed - Valid encounter within last 12 months    Recent Outpatient Visits           3 months ago Uncontrolled type 2 diabetes mellitus with hyperglycemia [facility])   [facility] Birdie Sons, MD   7 months ago Chronic bilateral low back pain with sciatica, sciatica laterality unspecified   [facility] Birdie Sons, MD   1 year ago Type 2 diabetes mellitus with other specified complication, without long-term current use of insulin [facility])   [facility] Birdie Sons, MD   1 year ago Chronic midline low back pain without sciatica   [facility] Birdie Sons, MD   1 year ago [place], [place]       Future Appointments             In  2 weeks Fisher, Kirstie Peri, MD [facility], [facility]                 Requested Prescriptions  Pending Prescriptions Disp Refills   pioglitazone (ACTOS) 15 MG tablet [Pharmacy Med Name: PIOGLITAZONE HCL 15 MG TAB] 30 tablet 0    Sig: TAKE 1 TABLET BY MOUTH ONCE DAILY      Endocrinology:  Diabetes - Glitazones - pioglitazone Failed - 04/21/2020  3:50 PM      Failed - HBA1C is between 0 and 7.9 and within 180 days    Hemoglobin A1C  Date Value Ref Range Status  01/06/2020 8.0 (A) 4.0 - 5.6 % Final   Hgb A1c MFr Bld  Date Value Ref Range Status  03/25/2018 7.9 (H) 4.8 - 5.6 % Final    Comment:             Prediabetes: 5.7 - 6.4          Diabetes: >6.4          Glycemic control for adults with diabetes: <7.0           Passed - Valid encounter within last 6 months    Recent Outpatient Visits           3 months ago Uncontrolled type 2 diabetes mellitus with hyperglycemia [facility])   [facility] Birdie Sons, MD   7 months ago Chronic bilateral low back pain with sciatica, sciatica laterality unspecified    [facility] Birdie Sons, MD   1 year ago Type 2 diabetes mellitus with other specified complication, without long-term current use of insulin ([place])   [facility] Birdie Sons, MD   1 year ago Chronic midline low back pain without sciatica   [facility] Birdie Sons, MD   1 year ago [place], Wendee [place], [place]       Future Appointments             In 2 weeks Caryn Section, Kirstie Peri, MD [facility], [facility]              Signed Prescriptions Disp Refills   RABEprazole (ACIPHEX) 20 MG tablet 30 tablet 0    Sig: TAKE 1 TABLET BY MOUTH ONCE DAILY      Gastroenterology: Proton Pump Inhibitors Passed - 04/21/2020  3:50 PM      Passed - Valid encounter within last 12 months    Recent Outpatient Visits           3 months ago Uncontrolled type 2 diabetes mellitus with hyperglycemia [facility])   [facility] Birdie Sons, MD   7 months ago Chronic bilateral low back pain with sciatica, sciatica laterality unspecified   [facility] Birdie Sons, MD   1 year ago Type 2 diabetes mellitus with other specified complication, without long-term current use of insulin [facility])   [facility] Birdie Sons, MD   1 year ago Chronic midline low back pain without sciatica   [facility] Birdie Sons, MD   1 year ago [place], [place], [place]       Future Appointments             In 2 weeks Caryn Section, Kirstie Peri, MD [facility], [place]

## 2020-04-23 MED ORDER — PIOGLITAZONE HCL 30 MG PO TABS: 30.0000 mg | ORAL_TABLET | Freq: Every day | ORAL | 3 refills | Status: DC

## 2020-05-10 ENCOUNTER — Ambulatory Visit (INDEPENDENT_AMBULATORY_CARE_PROVIDER_SITE_OTHER): Payer: Managed Care, Other (non HMO) | Admitting: Family Medicine

## 2020-05-10 ENCOUNTER — Encounter: Payer: Self-pay | Admitting: Family Medicine

## 2020-05-10 ENCOUNTER — Other Ambulatory Visit: Payer: Self-pay

## 2020-05-10 VITALS — BP 128/63 | HR 59 | Temp 98.0°F | Resp 16 | Ht 68.0 in | Wt 215.0 lb

## 2020-05-10 DIAGNOSIS — R0609 Other forms of dyspnea: Secondary | ICD-10-CM

## 2020-05-10 DIAGNOSIS — R0600 Dyspnea, unspecified: Secondary | ICD-10-CM

## 2020-05-10 DIAGNOSIS — J301 Allergic rhinitis due to pollen: Secondary | ICD-10-CM

## 2020-05-10 DIAGNOSIS — M545 Low back pain, unspecified: Secondary | ICD-10-CM

## 2020-05-10 DIAGNOSIS — F321 Major depressive disorder, single episode, moderate: Secondary | ICD-10-CM

## 2020-05-10 DIAGNOSIS — E1165 Type 2 diabetes mellitus with hyperglycemia: Secondary | ICD-10-CM | POA: Diagnosis not present

## 2020-05-10 DIAGNOSIS — M533 Sacrococcygeal disorders, not elsewhere classified: Secondary | ICD-10-CM

## 2020-05-10 DIAGNOSIS — M5440 Lumbago with sciatica, unspecified side: Secondary | ICD-10-CM

## 2020-05-10 DIAGNOSIS — G8929 Other chronic pain: Secondary | ICD-10-CM

## 2020-05-10 DIAGNOSIS — E782 Mixed hyperlipidemia: Secondary | ICD-10-CM

## 2020-05-10 DIAGNOSIS — R0789 Other chest pain: Secondary | ICD-10-CM

## 2020-05-10 DIAGNOSIS — R0989 Other specified symptoms and signs involving the circulatory and respiratory systems: Secondary | ICD-10-CM

## 2020-05-10 DIAGNOSIS — F1120 Opioid dependence, uncomplicated: Secondary | ICD-10-CM

## 2020-05-10 DIAGNOSIS — E1169 Type 2 diabetes mellitus with other specified complication: Secondary | ICD-10-CM

## 2020-05-10 DIAGNOSIS — I2510 Atherosclerotic heart disease of native coronary artery without angina pectoris: Secondary | ICD-10-CM

## 2020-05-10 DIAGNOSIS — E559 Vitamin D deficiency, unspecified: Secondary | ICD-10-CM

## 2020-05-10 DIAGNOSIS — M5441 Lumbago with sciatica, right side: Secondary | ICD-10-CM

## 2020-05-10 DIAGNOSIS — R609 Edema, unspecified: Secondary | ICD-10-CM

## 2020-05-10 DIAGNOSIS — G4710 Hypersomnia, unspecified: Secondary | ICD-10-CM

## 2020-05-10 LAB — POCT UA - MICROALBUMIN: Microalbumin Ur, POC: 20 mg/L

## 2020-05-10 LAB — POCT GLYCOSYLATED HEMOGLOBIN (HGB A1C)
Est. average glucose Bld gHb Est-mCnc: 246
Hemoglobin A1C: 10.2 % — AB (ref 4.0–5.6)

## 2020-05-10 MED ORDER — CYCLOBENZAPRINE HCL 5 MG PO TABS
5.0000 mg | ORAL_TABLET | Freq: Three times a day (TID) | ORAL | 11 refills | Status: DC | PRN
Start: 2020-05-10 — End: 2021-01-26

## 2020-05-10 MED ORDER — AZELASTINE HCL 0.05 % OP SOLN: OPHTHALMIC | 3 refills | Status: DC

## 2020-05-10 MED ORDER — AZELASTINE HCL 0.1 % NA SOLN
2.0000 | Freq: Two times a day (BID) | NASAL | 6 refills | Status: DC
Start: 2020-05-10 — End: 2021-01-26

## 2020-05-10 MED ORDER — ALBUTEROL SULFATE HFA 108 (90 BASE) MCG/ACT IN AERS: 2.0000 | INHALATION_SPRAY | Freq: Four times a day (QID) | RESPIRATORY_TRACT | 3 refills | Status: DC | PRN

## 2020-05-10 MED ORDER — CITALOPRAM HYDROBROMIDE 40 MG PO TABS: 40.0000 mg | ORAL_TABLET | Freq: Every day | ORAL | 4 refills | Status: DC

## 2020-05-10 MED ORDER — OXYCODONE HCL 15 MG PO TABS: 15.0000 mg | ORAL_TABLET | ORAL | 0 refills | Status: DC | PRN

## 2020-05-10 MED ORDER — NITROGLYCERIN 0.4 MG SL SUBL: 0.4000 mg | SUBLINGUAL_TABLET | SUBLINGUAL | 1 refills | Status: DC | PRN

## 2020-05-10 MED ORDER — OZEMPIC (0.25 OR 0.5 MG/DOSE) 2 MG/1.5ML ~~LOC~~ SOPN: 0.2500 mg | PEN_INJECTOR | SUBCUTANEOUS | 5 refills | Status: DC

## 2020-05-10 NOTE — Patient Instructions (Addendum)
.   You will be due for annual lung tumor screening in February. You should get a call before the end of the month to have it scheduled.

## 2020-05-10 NOTE — Progress Notes (Signed)
Established patient visit   Patient: Vincent Hall   DOB: 13-Dec-1965   55 y.o. Male  MRN: [id number] Visit Date: 05/10/2020  Today's healthcare provider: [name], MD   No chief complaint on file.  Subjective    HPI  Diabetes Mellitus Type II, Follow-up  Lab Results  Component Value Date   HGBA1C 8.0 (A) 01/06/2020   HGBA1C 6.8 (A) 09/05/2019   HGBA1C 6.3 (A) 04/15/2019   Wt Readings from Last 3 Encounters:  02/09/20 230 lb (104.3 kg)  01/15/20 220 lb (99.8 kg)  01/06/20 220 lb (99.8 kg)   Last seen for diabetes 4 months ago.  Management since then includes increasing Actos from 74m to 364mdaily. . Marland Kitchene reports good compliance with treatment. However, patient reports that he was only given Actos 1578my the pharmacy.  He is not having side effects.  Symptoms: Yes fatigue No foot ulcerations  No appetite changes No nausea  Yes paresthesia of the feet  No polydipsia  No polyuria No visual disturbances   No vomiting     Home blood sugar records: fasting range: 400s  Episodes of hypoglycemia? No    Current insulin regiment: Humalog Most Recent Eye Exam: > 75yr72yr Current exercise: no regular exercise Current diet habits: well balanced  Pertinent Labs: Lab Results  Component Value Date   CHOL 140 01/06/2020   HDL 40 01/06/2020   LDLCALC 69 01/06/2020   TRIG 186 (H) 01/06/2020   CHOLHDL 3.5 01/06/2020   Lab Results  Component Value Date   NA 132 (L) 02/09/2020   K 3.8 02/09/2020   CREATININE 0.98 02/09/2020   GFRNONAA >60 02/09/2020   GFRAA 114 01/06/2020   GLUCOSE 266 (H) 02/09/2020      Follow up for chronic low back pain:  The patient was last seen for this 4 months ago. Changes made at last visit include adding Celebrex.  He reports poor compliance with treatment. He feels that condition is Unchanged. He is not having side effects.   [facility] been [facility] consistently at that time. Patient now reports he is taking his psychiatric medications consistently and has not had any suicidal ideation recently, and is generally in much better mood.  He apparently had the Ozempic and rosuvastatin stopped at some point.      Medications: Outpatient Medications Prior to Visit  Medication Sig  . albuterol (PROVENTIL HFA;VENTOLIN HFA) 108 (90 Base) MCG/ACT inhaler Inhale 2 puffs into the lungs every 6 (six) hours as needed for wheezing or shortness of breath.  . asMarland Kitchenirin 81 MG tablet Take 81 mg by mouth daily.  . azMarland Kitchenlastine (ASTELIN) 0.1 % nasal spray Place 2 sprays into both nostrils 2 (two) times daily.  . azMarland Kitchenlastine (OPTIVAR) 0.05 % ophthalmic solution drop affect eye twice a day  . Blood Glucose Monitoring Suppl (ONE TOUCH ULTRA 2) w/Device KIT USE AS DIRECTED  . celecoxib (CELEBREX) 100 MG capsule TAKE 1 CAPSULE BY MOUTH TWICE DAILY  . citalopram (CELEXA) 40 MG tablet Take 1 tablet (40 mg total) by mouth daily.  . cyclobenzaprine (FLEXERIL) 5 MG tablet TAKE ONE TABLET BY MOUTH THREE TIMES A DAY AS NEEDED FOR MUSCLE SPASMS  . glipiZIDE (GLUCOTROL XL) 5 MG 24 hr tablet TAKE 1 TABLET BY MOUTH ONCE DAILY WITH BREAKFAST  . glucose blood (ONE TOUCH  ULTRA TEST) test strip Check blood sugar 3 times daily E11.65 (uncontrolled insulin dependent diabetes)  . insulin lispro (HUMALOG KWIKPEN) 200 UNIT/ML KwikPen Use before meals up to three times a day. Take 6 units if sugar is over 200. 8 units if over 250 and 10 units if over 300.  Marland Kitchen linaclotide (LINZESS) 290 MCG CAPS capsule Take 290 mcg by mouth daily before breakfast.  . mometasone (ELOCON) 0.1 % cream One application daily as needed for rash  . oxyCODONE (ROXICODONE) 15 MG immediate release tablet Take 1 tablet (15 mg total) by mouth every 4 (four) hours as needed for pain.  . pioglitazone (ACTOS) 30 MG tablet Take 1  tablet (30 mg total) by mouth daily.  . promethazine (PHENERGAN) 12.5 MG tablet Take 1 tablet (12.5 mg total) by mouth every 6 (six) hours as needed.  . RABEprazole (ACIPHEX) 20 MG tablet TAKE 1 TABLET BY MOUTH ONCE DAILY  . traZODone (DESYREL) 100 MG tablet TAKE 2 TABLETS BY MOUTH AT BEDTIME  . ketoconazole (NIZORAL) 2 % cream Apply 1 application topically daily.  . nitroGLYCERIN (NITROSTAT) 0.4 MG SL tablet Place 1 tablet (0.4 mg total) under the tongue every 5 (five) minutes as needed for chest pain.  Marland Kitchen OZEMPIC, 0.25 OR 0.5 MG/DOSE, 2 MG/1.5ML SOPN INJECT 0.5MG SUBQ ONCE A WEEK (Patient not taking: Reported on 05/10/2020)  . rosuvastatin (CRESTOR) 40 MG tablet Take 1 tablet (40 mg total) by mouth daily. (Patient not taking: Reported on 05/10/2020)  . TRULANCE 3 MG TABS TAKE ONE TABLET (3MG) BY MOUTH DAILY (Patient not taking: Reported on 05/10/2020)  . [DISCONTINUED] cloNIDine (CATAPRES) 0.1 MG tablet Take 1 tablet (0.1 mg total) by mouth 2 (two) times daily for 3 days.  . [DISCONTINUED] RABEprazole (ACIPHEX) 20 MG tablet TAKE ONE TABLET BY MOUTH DAILY IN PLACE OF DEXILANT  . [DISCONTINUED] RABEprazole (ACIPHEX) 20 MG tablet Take 1 tablet (20 mg total) by mouth daily.  . [DISCONTINUED] RABEprazole (ACIPHEX) 20 MG tablet TAKE 1 TABLET BY MOUTH ONCE DAILY  . [DISCONTINUED] tiZANidine (ZANAFLEX) 4 MG capsule Take 1 capsule (4 mg total) by mouth 2 (two) times daily as needed for muscle spasms. (Patient not taking: Reported on 05/10/2020)   No facility-administered medications prior to visit.       Objective    BP 128/63   Pulse (!) 59   Temp 98 F (36.7 C)   Resp 16   Ht '5\' 8"'  (1.727 m)   Wt 215 lb (97.5 kg)   SpO2 100%   BMI 32.69 kg/m     Physical Exam   General: Appearance:    Mildly obese male in no acute distress  Eyes:    PERRL, conjunctiva/corneas clear, EOM's intact       Lungs:     Clear to auscultation bilaterally, respirations unlabored  Heart:    Bradycardic. Normal  rhythm. No murmurs, rubs, or gallops. 1+ bilateral pitting edema.   MS:   All extremities are intact.   Neurologic:   Awake, alert, oriented x 3. No apparent focal neurological           defect.         Results for orders placed or performed in visit on 05/10/20  POCT HgB A1C  Result Value Ref Range   Hemoglobin A1C 10.2 (A) 4.0 - 5.6 %   Est. average glucose Bld gHb Est-mCnc 246     Assessment & Plan     1. Uncontrolled type 2 diabetes mellitus with  hyperglycemia ([place]) Has apparently been off of Ozempic accounting for dramatic rise in A1c. Is to restart at low dose and follow up in about 6 weeks.   2. Narcotic dependence ([place]) Dong better since starting back on his antidepressants.   3. Current moderate episode of major depressive disorder without prior episode [facility]) Had been off of antidepressants for a few months prior to psychiatric admission in November. He is feeling much better and needs refill- citalopram (CELEXA) 40 MG tablet; Take 1 tablet (40 mg total) by mouth daily.  Dispense: 90 tablet; Refill: 4  4. Chronic bilateral low back pain with sciatica, sciatica laterality unspecified refill - cyclobenzaprine (FLEXERIL) 5 MG tablet; Take 1 tablet (5 mg total) by mouth 3 (three) times daily as needed for muscle spasms. TAKE ONE TABLET BY MOUTH THREE TIMES A DAY AS NEEDED FOR MUSCLE SPASMS  Dispense: 30 tablet; Refill: 11 - Pain Mgt Scrn (14 Drugs), Ur  5. Dyspnea on exertion  - Brain natriuretic peptide - CBC - albuterol (VENTOLIN HFA) 108 (90 Base) MCG/ACT inhaler; Inhale 2 puffs into the lungs every 6 (six) hours as needed for wheezing or shortness of breath.  Dispense: 1 each; Refill: 3  6. Edema, unspecified type Recent onset seeming around the time pioglitazone was increased. If BNP is normal will manage symptomatically.  - Comprehensive metabolic panel - CBC  7. Chronic sinus complaints He was seen several years ago and [facility] and has been having more  chronic sinus congestion lately, requesting referral back to ENT.  - Ambulatory referral to ENT  8. Hypersomnia  - Home sleep test  9. Allergic rhinitis due to pollen, unspecified seasonality refill - azelastine (OPTIVAR) 0.05 % ophthalmic solution; drop affect eye twice a day  Dispense: 6 mL; Refill: 3 - azelastine (ASTELIN) 0.1 % nasal spray; Place 2 sprays into both nostrils 2 (two) times daily.  Dispense: 30 mL; Refill: 6  10. Current moderate episode of major depressive disorder without prior episode ([place]) Start back - citalopram (CELEXA) 40 MG tablet; Take 1 tablet (40 mg total) by mouth daily.  Dispense: 90 tablet; Refill: 4  11. Chronic midline low back pain without sciatica refill- oxyCODONE (ROXICODONE) 15 MG immediate release tablet; Take 1 tablet (15 mg total) by mouth every 4 (four) hours as needed for pain.  Dispense: 180 tablet; Refill: 0  12. Chest wall pain refill - oxyCODONE (ROXICODONE) 15 MG immediate release tablet; Take 1 tablet (15 mg total) by mouth every 4 (four) hours as needed for pain.  Dispense: 180 tablet; Refill: 0  13. Chronic left SI joint pain  14. Coronary artery disease involving native coronary artery of native heart without angina pectoris Asymptomatic. Compliant with medication.  Continue aggressive risk factor modification.    15. Hyperlipidemia, mixed Has been off rosuvastatin. Check - Lipid panel  16. Vitamin D deficiency  - VITAMIN D 25 Hydroxy (Vit-D Deficiency, Fractures)   Addressed extensive list of chronic and acute medical problems today requiring 50 minutes reviewing his medical record, counseling patient regarding his conditions and coordination of care.        The entirety of the information documented in the History of Present Illness, Review of Systems and Physical Exam were personally obtained by me. Portions of this information were initially documented by the CMA and reviewed by me for thoroughness and accuracy.       [name], MD  [facility] [phone] (phone) [phone] (fax)  [place]

## 2020-05-11 ENCOUNTER — Other Ambulatory Visit: Payer: Self-pay | Admitting: Family Medicine

## 2020-05-11 DIAGNOSIS — E559 Vitamin D deficiency, unspecified: Secondary | ICD-10-CM | POA: Insufficient documentation

## 2020-05-11 LAB — COMPREHENSIVE METABOLIC PANEL
ALT: 21 IU/L (ref 0–44)
AST: 23 IU/L (ref 0–40)
Albumin/Globulin Ratio: 1.8 (ref 1.2–2.2)
Albumin: 4.8 g/dL (ref 3.8–4.9)
Alkaline Phosphatase: 86 IU/L (ref 44–121)
BUN/Creatinine Ratio: 9 (ref 9–20)
BUN: 8 mg/dL (ref 6–24)
Bilirubin Total: 0.3 mg/dL (ref 0.0–1.2)
CO2: 27 mmol/L (ref 20–29)
Calcium: 10.1 mg/dL (ref 8.7–10.2)
Chloride: 100 mmol/L (ref 96–106)
Creatinine, Ser: 0.93 mg/dL (ref 0.76–1.27)
GFR calc Af Amer: 106 mL/min/{1.73_m2} (ref >59)
GFR calc non Af Amer: 91 mL/min/{1.73_m2} (ref >59)
Globulin, Total: 2.6 g/dL (ref 1.5–4.5)
Glucose: 90 mg/dL (ref 65–99)
Potassium: 4.5 mmol/L (ref 3.5–5.2)
Sodium: 142 mmol/L (ref 134–144)
Total Protein: 7.4 g/dL (ref 6.0–8.5)

## 2020-05-11 LAB — LIPID PANEL
Chol/HDL Ratio: 5.2 ratio — ABNORMAL HIGH (ref 0.0–5.0)
Cholesterol, Total: 230 mg/dL — ABNORMAL HIGH (ref 100–199)
HDL: 44 mg/dL (ref >39)
LDL Chol Calc (NIH): 137 mg/dL — ABNORMAL HIGH (ref 0–99)
Triglycerides: 271 mg/dL — ABNORMAL HIGH (ref 0–149)
VLDL Cholesterol Cal: 49 mg/dL — ABNORMAL HIGH (ref 5–40)

## 2020-05-11 LAB — CBC
Hematocrit: 44.2 % (ref 37.5–51.0)
Hemoglobin: 14.5 g/dL (ref 13.0–17.7)
MCH: 26.9 pg (ref 26.6–33.0)
MCHC: 32.8 g/dL (ref 31.5–35.7)
MCV: 82 fL (ref 79–97)
Platelets: 238 10*3/uL (ref 150–450)
RBC: 5.39 x10E6/uL (ref 4.14–5.80)
RDW: 12.3 % (ref 11.6–15.4)
WBC: 5.6 10*3/uL (ref 3.4–10.8)

## 2020-05-11 LAB — BRAIN NATRIURETIC PEPTIDE: BNP: 39.5 pg/mL (ref 0.0–100.0)

## 2020-05-11 LAB — VITAMIN D 25 HYDROXY (VIT D DEFICIENCY, FRACTURES): Vit D, 25-Hydroxy: 15.9 ng/mL — ABNORMAL LOW (ref 30.0–100.0)

## 2020-05-11 NOTE — Addendum Note (Signed)
Addended by: Wilburt Finlay on: 05/11/2020 01:51 PM   Modules accepted: Orders

## 2020-05-12 LAB — PAIN MGT SCRN (14 DRUGS), UR
Amphetamine Scrn, Ur: NEGATIVE ng/mL
BARBITURATE SCREEN URINE: NEGATIVE ng/mL
BENZODIAZEPINE SCREEN, URINE: NEGATIVE ng/mL
Buprenorphine, Urine: POSITIVE ng/mL — AB
CANNABINOIDS UR QL SCN: NEGATIVE ng/mL
Cocaine (Metab) Scrn, Ur: NEGATIVE ng/mL
Creatinine(Crt), U: 18.7 mg/dL — ABNORMAL LOW (ref 20.0–300.0)
Fentanyl, Urine: NEGATIVE pg/mL
Meperidine Screen, Urine: NEGATIVE ng/mL
Methadone Screen, Urine: NEGATIVE ng/mL
OXYCODONE+OXYMORPHONE UR QL SCN: POSITIVE ng/mL — AB
Opiate Scrn, Ur: NEGATIVE ng/mL
Ph of Urine: 7.8 (ref 4.5–8.9)
Phencyclidine Qn, Ur: NEGATIVE ng/mL
Propoxyphene Scrn, Ur: NEGATIVE ng/mL
Tramadol Screen, Urine: NEGATIVE ng/mL

## 2020-05-12 LAB — SPECIFIC GRAVITY (REFLEXED): SPECIFIC GRAVITY: 1.0038

## 2020-05-17 IMAGING — CT CT ABDOMEN AND PELVIS WITH CONTRAST
2 of 5 series · 16 of 46 positions shown, 18 images · IV contrast (omnipaque)
Comparison: Abdominal ultrasound dated March 27, 2018. CT
abdomen pelvis dated May 29, 2008.

CLINICAL DATA: Severe abdominal pain and constipation for the past
3 days.

EXAM:
CT ABDOMEN AND PELVIS WITH CONTRAST
TECHNIQUE: Multidetector CT imaging of the abdomen and pelvis was performed
using the standard protocol following bolus administration of
intravenous contrast.
CONTRAST:  100mL OMNIPAQUE IOHEXOL 300 MG/ML  SOLN

[Series 2: axial st · axial · 0.89mm/px · z∈[+439,+924]mm · 13 of 109 slices shown, 15 images]
[im 6/109  soft-tissue]
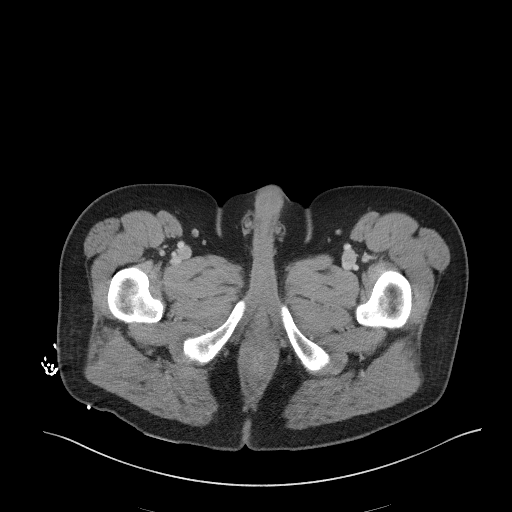
[im 6/109  bone]
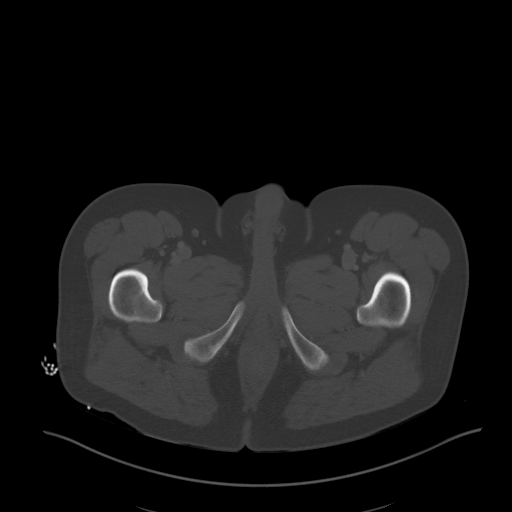
[im 18/109  soft-tissue]
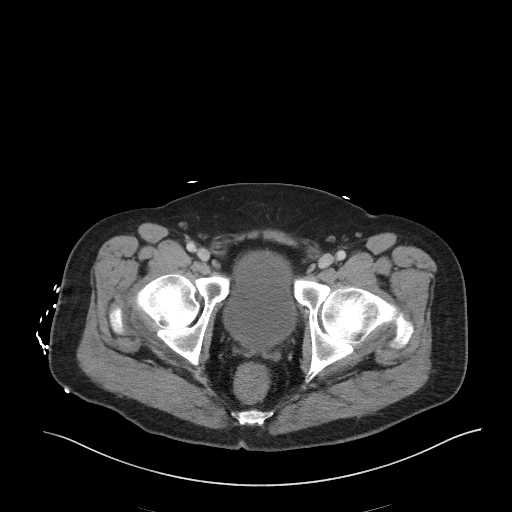
[im 23/109  soft-tissue]
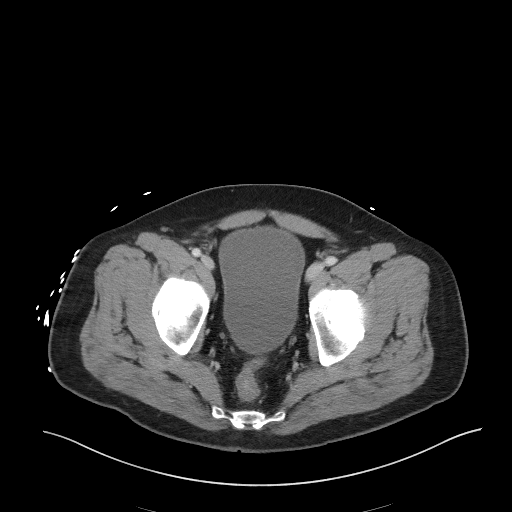
[im 29/109  soft-tissue]
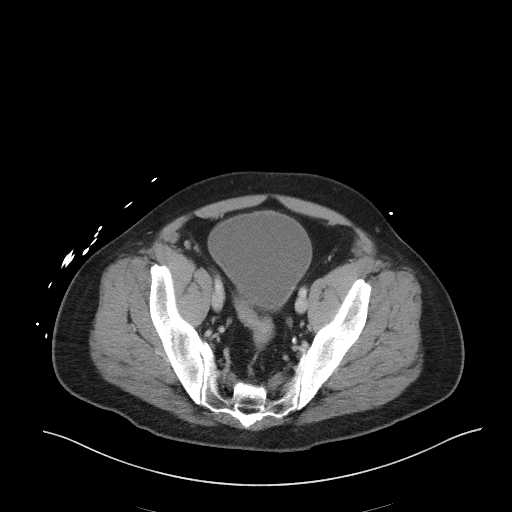
[im 40/109  soft-tissue]
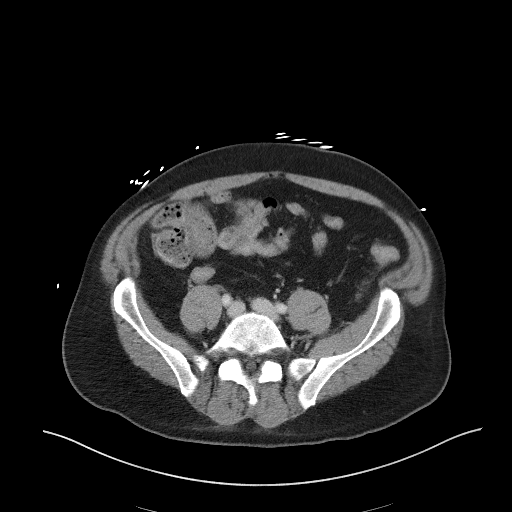
[im 46/109  soft-tissue]
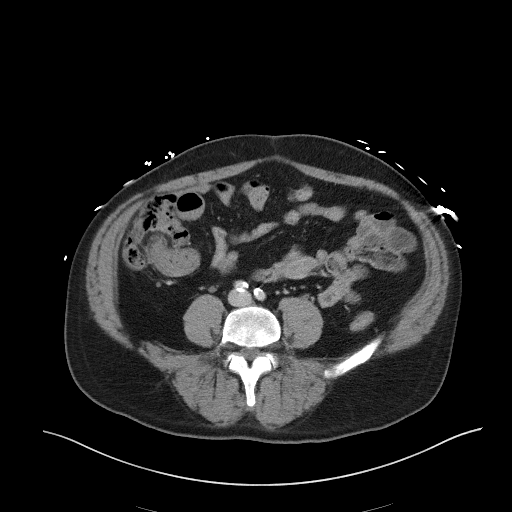
[im 57/109  soft-tissue]
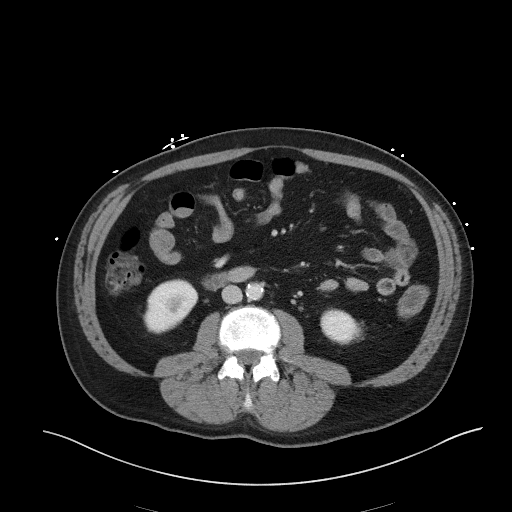
[im 63/109  soft-tissue]
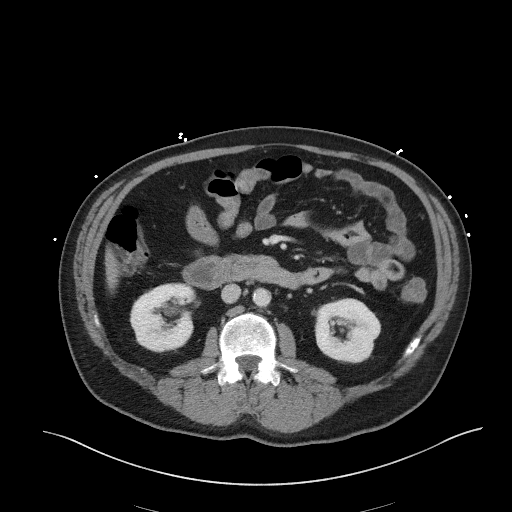
[im 69/109  soft-tissue]
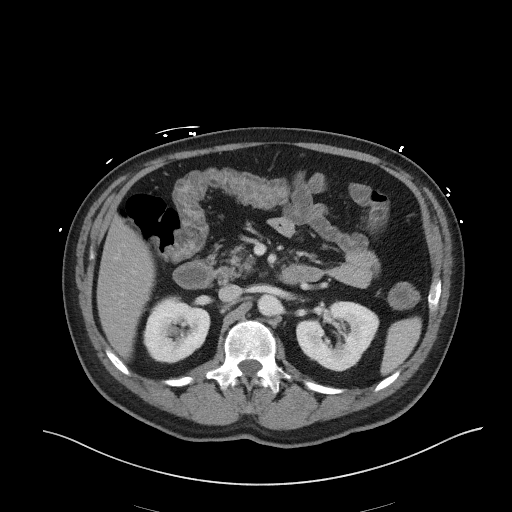
[im 69/109  bone]
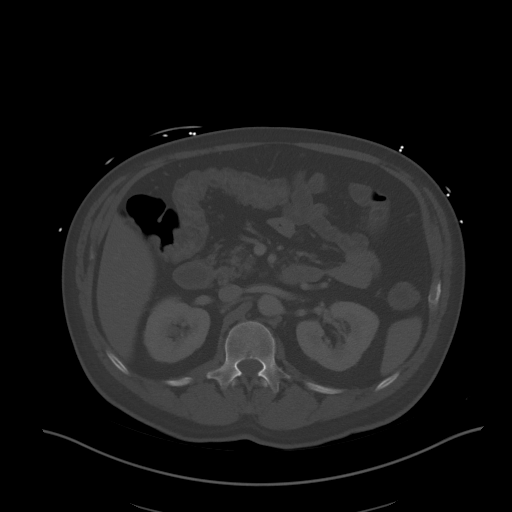
[im 80/109  soft-tissue]
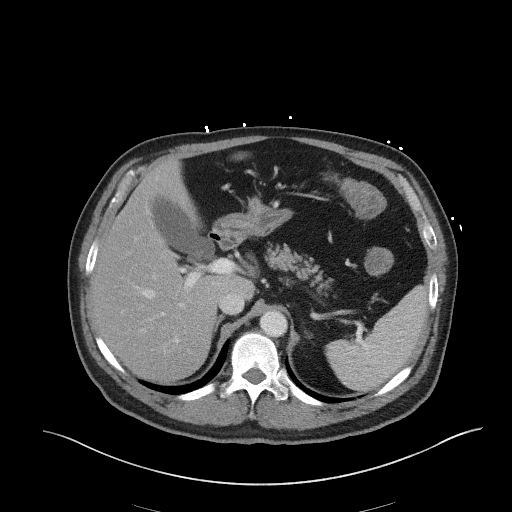
[im 86/109  soft-tissue]
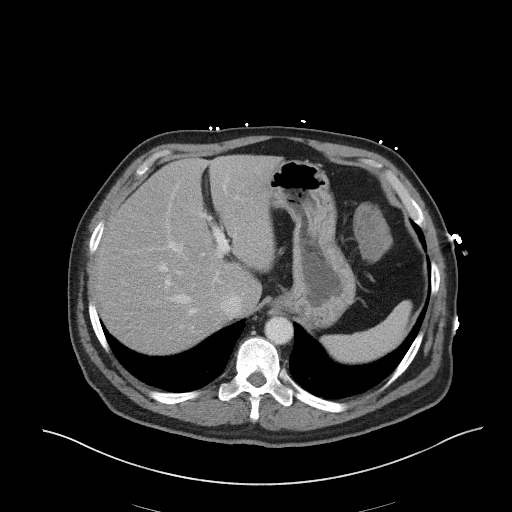
[im 91/109  soft-tissue]
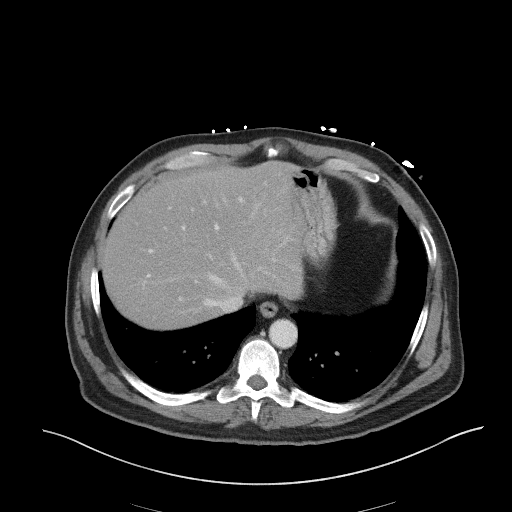
[im 103/109  soft-tissue]
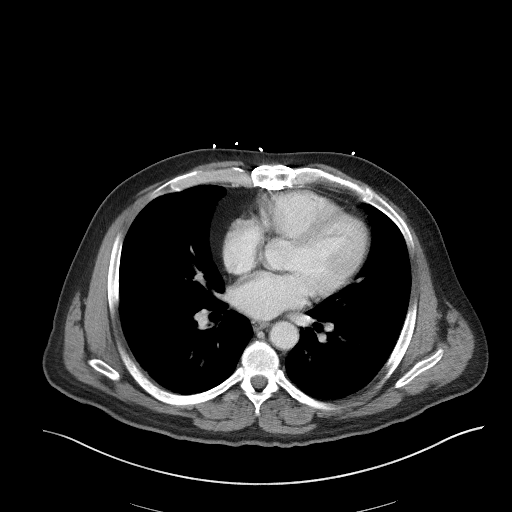

[Series 5: coronal st · coronal · 0.82mm/px · 3 of 94 slices shown]
[im 32/94  soft-tissue]
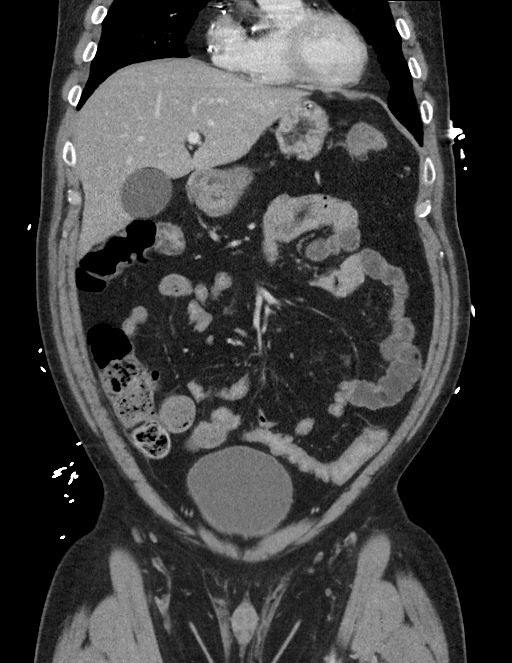
[im 42/94  soft-tissue]
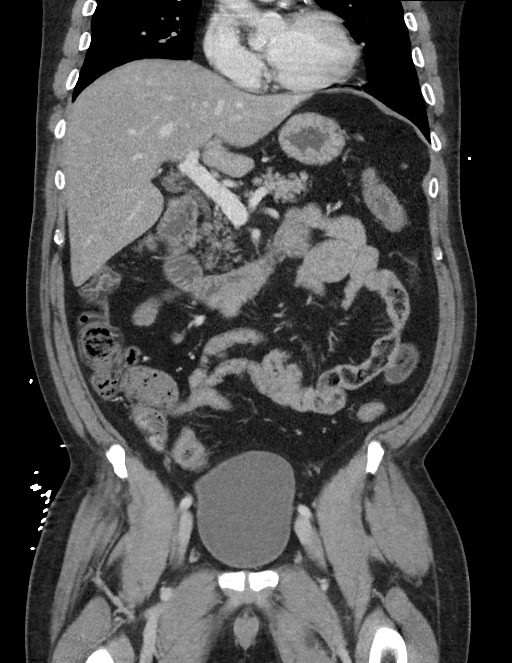
[im 52/94  soft-tissue]
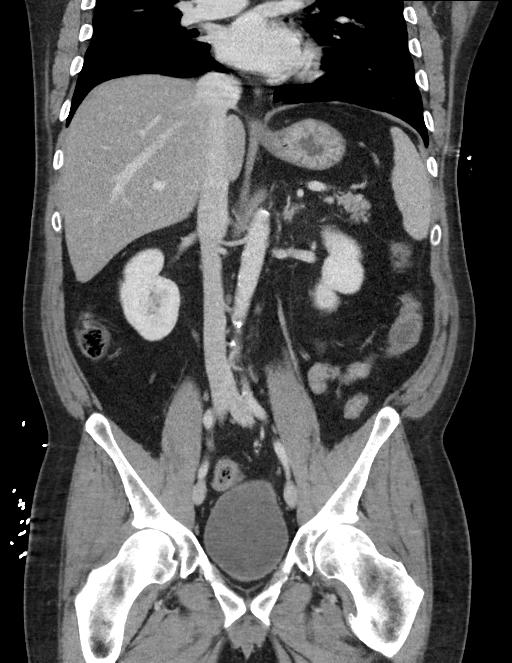

[16 of 46 positions shown; findings below may reference images not displayed]

FINDINGS: Lower chest: No acute abnormality. 4 mm nodule in the right middle
lobe (series 4, image 6).

Hepatobiliary: Unchanged diffuse hepatic steatosis. No focal liver
abnormality. The gallbladder is unremarkable. No biliary dilatation.

Pancreas: Unremarkable. No pancreatic ductal dilatation or
surrounding inflammatory changes.

Spleen: Normal in size without focal abnormality.

Adrenals/Urinary Tract: Adrenal glands are unremarkable.
Subcentimeter low-density lesion in the lower pole of the right
kidney is too small to characterize. No renal calculi or
hydronephrosis. Bladder is unremarkable.

Stomach/Bowel: The stomach is within normal limits. Mild
circumferential wall thickening of the transverse and descending
colon as well as the rectum. No obstruction. History of prior
appendectomy.

Vascular/Lymphatic: Aortic atherosclerosis. No enlarged abdominal or
pelvic lymph nodes.

Reproductive: Prostate is unremarkable.

Other: No abdominal wall hernia or abnormality. No abdominopelvic
ascites. No pneumoperitoneum.

Musculoskeletal: No acute or significant osseous findings.
IMPRESSION: 1. Mild circumferential wall thickening of the transverse and
descending colon as well as the rectum, suggestive of proctocolitis.
2. Unchanged diffuse hepatic steatosis.
3. 4 mm pulmonary nodule in the right middle lobe. No follow-up
needed if patient is low-risk. Non-contrast chest CT can be
considered in 12 months if patient is high-risk. This recommendation
follows the consensus statement: Guidelines for Management of
Incidental Pulmonary Nodules Detected on CT Images: From the
4.  Aortic atherosclerosis ([id number].X).

## 2020-05-17 IMAGING — CR ABDOMEN - 1 VIEW
1 series · 2 of 2 positions shown · non-contrast
Comparison: None.

CLINICAL DATA: Abdominal pain, constipation for 3 days

EXAM:
ABDOMEN - 1 VIEW

[Series 1: dg abd 1 view · 0.14mm/px · 2 of 2 slices shown]
[im 1/2]
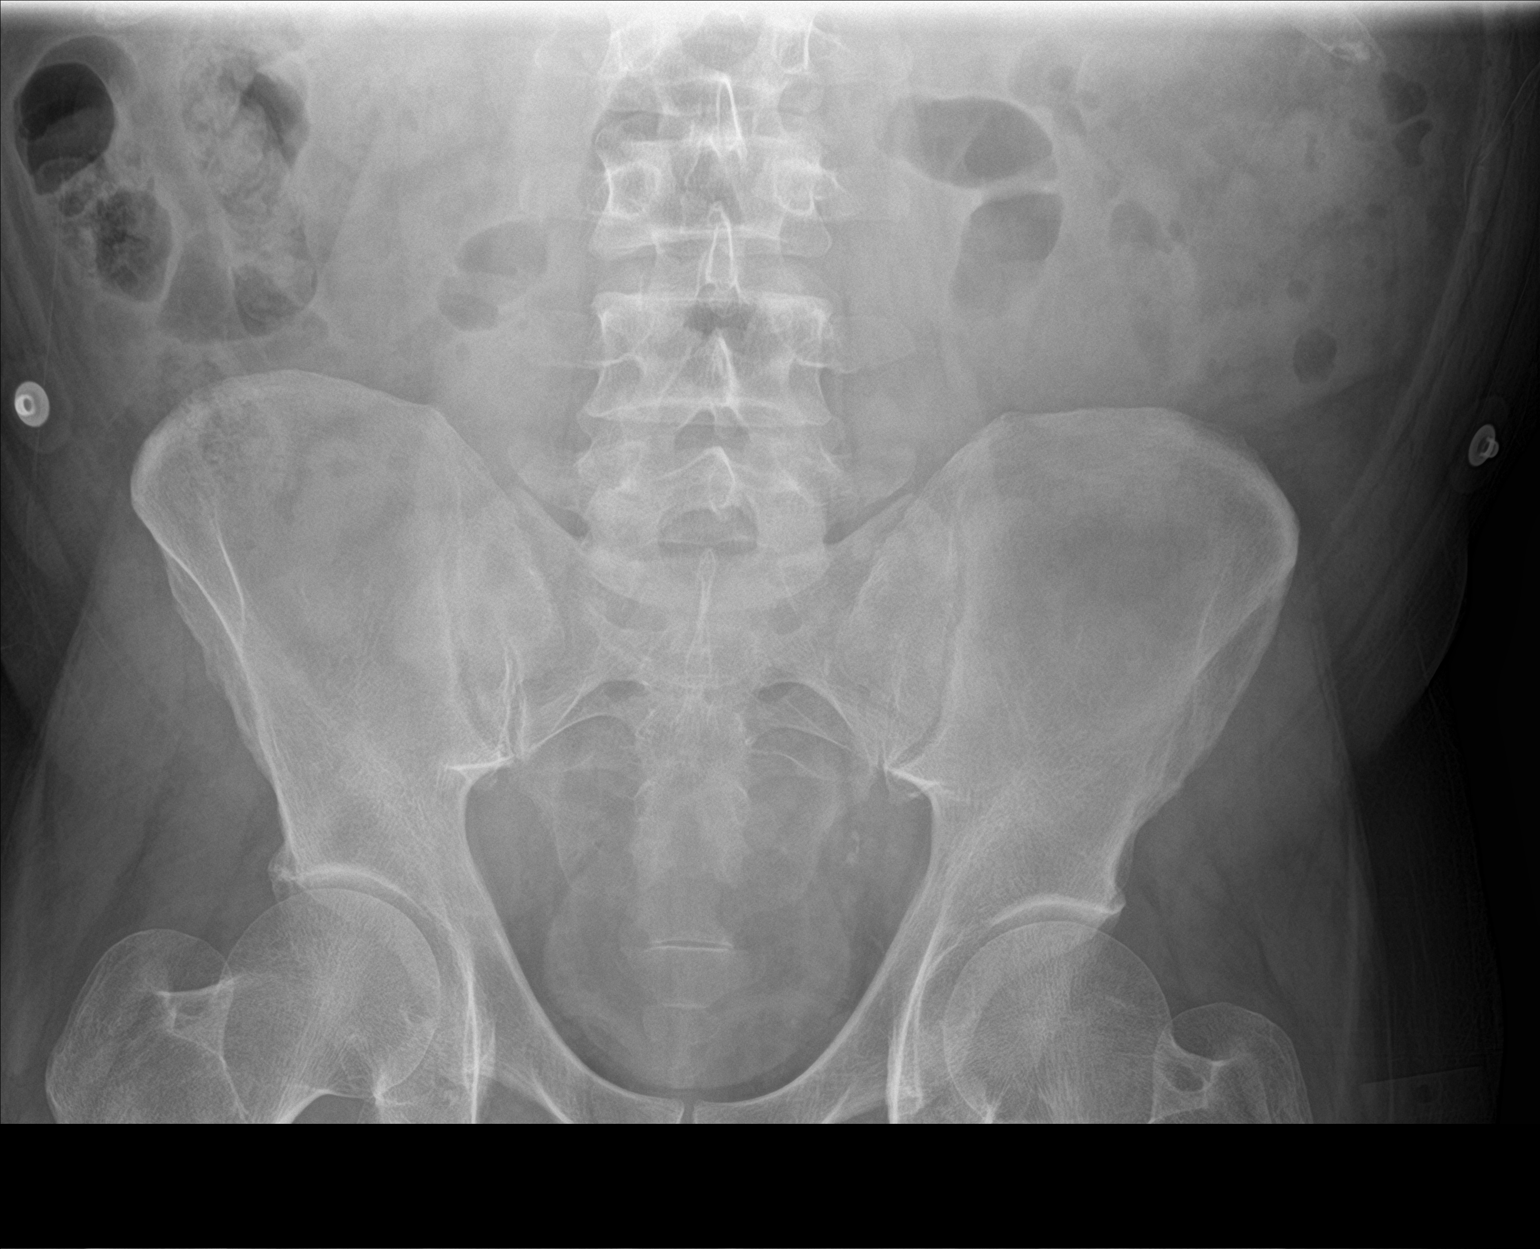
[im 2/2]
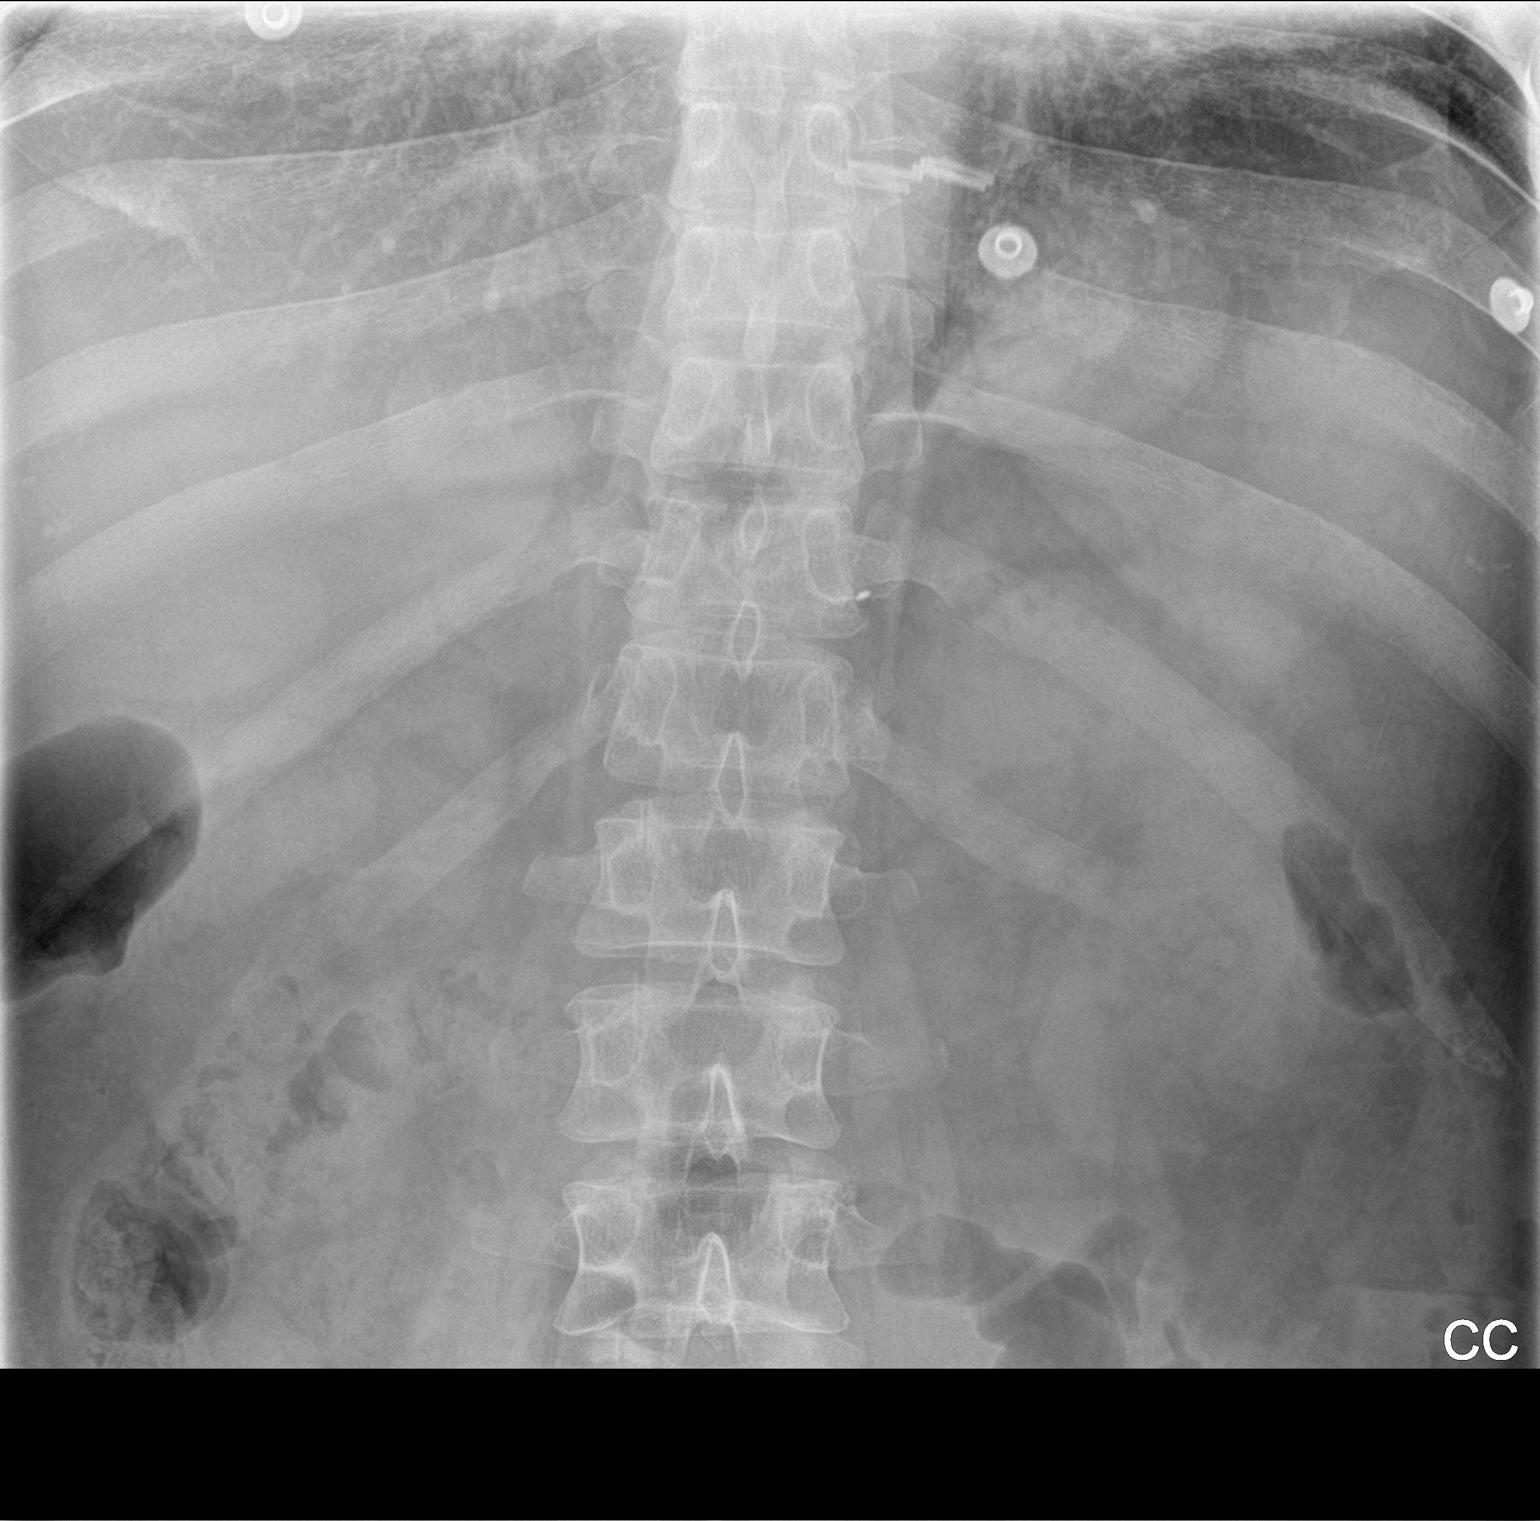

[2 of 2 positions shown; findings below may reference images not displayed]

FINDINGS: The bowel gas pattern is normal. No radio-opaque calculi or other
significant radiographic abnormality are seen.
IMPRESSION: Negative.

## 2020-05-20 ENCOUNTER — Telehealth: Payer: Self-pay | Admitting: *Deleted

## 2020-05-20 NOTE — Telephone Encounter (Signed)
Attempted to contact and schedule lung screening scan. Message left for patient to call back to schedule. 

## 2020-05-27 ENCOUNTER — Other Ambulatory Visit: Payer: Self-pay | Admitting: Family Medicine

## 2020-06-03 ENCOUNTER — Telehealth: Payer: Self-pay

## 2020-06-03 DIAGNOSIS — Z122 Encounter for screening for malignant neoplasm of respiratory organs: Secondary | ICD-10-CM

## 2020-06-03 DIAGNOSIS — Z87891 Personal history of nicotine dependence: Secondary | ICD-10-CM

## 2020-06-04 NOTE — Telephone Encounter (Signed)
Contacted and scheduled for annual lung screening scan. Former smoker, quit 08/08/13, 45 pack year history.

## 2020-06-08 ENCOUNTER — Other Ambulatory Visit: Payer: Self-pay | Admitting: Family Medicine

## 2020-06-08 DIAGNOSIS — G8929 Other chronic pain: Secondary | ICD-10-CM

## 2020-06-08 DIAGNOSIS — M533 Sacrococcygeal disorders, not elsewhere classified: Secondary | ICD-10-CM

## 2020-06-08 DIAGNOSIS — M545 Low back pain, unspecified: Secondary | ICD-10-CM

## 2020-06-08 DIAGNOSIS — R0789 Other chest pain: Secondary | ICD-10-CM

## 2020-06-08 NOTE — Telephone Encounter (Signed)
Requested medication (s) are due for refill today: yes  Requested medication (s) are on the active medication list: yes  Last refill: 05/10/2020  Future visit scheduled: no  Notes to clinic:  this refill cannot be delegated    Requested Prescriptions  Pending Prescriptions Disp Refills   oxyCODONE (ROXICODONE) 15 MG immediate release tablet 180 tablet 0    Sig: Take 1 tablet (15 mg total) by mouth every 4 (four) hours as needed for pain.      Not Delegated - Analgesics:  Opioid Agonists Failed - 06/08/2020  2:01 PM      Failed - This refill cannot be delegated      Failed - Urine Drug Screen completed in last 360 days      Passed - Valid encounter within last 6 months    Recent Outpatient Visits           4 weeks ago Uncontrolled type 2 diabetes mellitus with hyperglycemia [facility])   [facility] Birdie Sons, MD   5 months ago Uncontrolled type 2 diabetes mellitus with hyperglycemia [facility])   [facility] Birdie Sons, MD   9 months ago Chronic bilateral low back pain with sciatica, sciatica laterality unspecified   [facility] Birdie Sons, MD   1 year ago Type 2 diabetes mellitus with other specified complication, without long-term current use of insulin [facility])   [facility] Birdie Sons, MD   1 year ago Chronic midline low back pain without sciatica   [facility] Birdie Sons, MD

## 2020-06-08 NOTE — Telephone Encounter (Signed)
Medication Refill - Medication: oxyCODONE (ROXICODONE) 15 MG immediate release tablet    Has the patient contacted their pharmacy? No. (Agent: If no, request that the patient contact the pharmacy for the refill.) (Agent: If yes, when and what did the pharmacy advise?)  Preferred Pharmacy (with phone number or street name):  [place], [place]  [address] [place] [address]  Phone:  [phone] Fax:  [phone]   Agent: Please be advised that RX refills may take up to 3 business days. We ask that you follow-up with your pharmacy.

## 2020-06-09 ENCOUNTER — Encounter: Payer: Self-pay | Admitting: Family Medicine

## 2020-06-09 LAB — PULMONARY FUNCTION TEST

## 2020-06-11 MED ORDER — OXYCODONE HCL 15 MG PO TABS: 15.0000 mg | ORAL_TABLET | ORAL | 0 refills | Status: DC | PRN

## 2020-06-11 NOTE — Telephone Encounter (Signed)
Please advise have sent refill, but we need to see him back to follow up on pain meds in 3-4 weeks, before next refill.

## 2020-06-21 ENCOUNTER — Ambulatory Visit: Admission: RE | Admit: 2020-06-21 | Payer: Managed Care, Other (non HMO) | Source: Ambulatory Visit

## 2020-06-23 ENCOUNTER — Encounter: Payer: Self-pay | Admitting: Family Medicine

## 2020-06-23 ENCOUNTER — Telehealth: Payer: Self-pay | Admitting: Family Medicine

## 2020-06-23 ENCOUNTER — Other Ambulatory Visit: Payer: Self-pay | Admitting: Family Medicine

## 2020-06-23 DIAGNOSIS — G4733 Obstructive sleep apnea (adult) (pediatric): Secondary | ICD-10-CM | POA: Insufficient documentation

## 2020-06-23 NOTE — Telephone Encounter (Signed)
Sleep study shows severe sleep apnea, have sent in order for CPAP, he will need to schedule follow up office visit 1 month after starting CPAP.

## 2020-06-23 NOTE — Telephone Encounter (Signed)
Patient advised and verbalized understanding 

## 2020-07-05 ENCOUNTER — Other Ambulatory Visit: Payer: Self-pay

## 2020-07-05 ENCOUNTER — Ambulatory Visit (INDEPENDENT_AMBULATORY_CARE_PROVIDER_SITE_OTHER): Payer: Managed Care, Other (non HMO) | Admitting: Family Medicine

## 2020-07-05 ENCOUNTER — Encounter: Payer: Self-pay | Admitting: Family Medicine

## 2020-07-05 ENCOUNTER — Telehealth: Payer: Self-pay | Admitting: Family Medicine

## 2020-07-05 VITALS — BP 129/61 | HR 70 | Temp 97.9°F | Resp 16 | Ht 68.0 in | Wt 221.0 lb

## 2020-07-05 DIAGNOSIS — R0789 Other chest pain: Secondary | ICD-10-CM | POA: Diagnosis not present

## 2020-07-05 DIAGNOSIS — M533 Sacrococcygeal disorders, not elsewhere classified: Secondary | ICD-10-CM | POA: Diagnosis not present

## 2020-07-05 DIAGNOSIS — E1169 Type 2 diabetes mellitus with other specified complication: Secondary | ICD-10-CM | POA: Diagnosis not present

## 2020-07-05 DIAGNOSIS — E1165 Type 2 diabetes mellitus with hyperglycemia: Secondary | ICD-10-CM

## 2020-07-05 DIAGNOSIS — F1129 Opioid dependence with unspecified opioid-induced disorder: Secondary | ICD-10-CM

## 2020-07-05 DIAGNOSIS — M545 Low back pain, unspecified: Secondary | ICD-10-CM

## 2020-07-05 DIAGNOSIS — G8929 Other chronic pain: Secondary | ICD-10-CM

## 2020-07-05 LAB — POCT GLYCOSYLATED HEMOGLOBIN (HGB A1C)
Est. average glucose Bld gHb Est-mCnc: 180
Hemoglobin A1C: 7.9 % — AB (ref 4.0–5.6)

## 2020-07-05 MED ORDER — OXYCODONE HCL 15 MG PO TABS
15.0000 mg | ORAL_TABLET | ORAL | 0 refills | Status: DC | PRN
Start: 2020-07-05 — End: 2020-08-09

## 2020-07-05 MED ORDER — OXYCODONE HCL 15 MG PO TABS: 15.0000 mg | ORAL_TABLET | ORAL | 0 refills | Status: DC | PRN

## 2020-07-05 NOTE — Progress Notes (Signed)
Established patient visit   Patient: Vincent Hall   DOB: 1965-12-16   55 y.o. Male  MRN: [id number] Visit Date: 07/05/2020  Today's healthcare provider: [name], MD   Chief Complaint  Patient presents with  . Diabetes   Subjective    HPI  Diabetes Mellitus Type II, Follow-up  Lab Results  Component Value Date   HGBA1C 7.9 (A) 07/05/2020   HGBA1C 10.2 (A) 05/10/2020   HGBA1C 8.0 (A) 01/06/2020   Wt Readings from Last 3 Encounters:  07/05/20 221 lb (100.2 kg)  05/10/20 215 lb (97.5 kg)  02/09/20 230 lb (104.3 kg)   Last seen for diabetes 2 months ago.  Management since then includes restarted Ozempic. He reports excellent compliance with treatment. He is not having side effects.  Symptoms: No fatigue No foot ulcerations  No appetite changes No nausea  No paresthesia of the feet  No polydipsia  No polyuria No visual disturbances   No vomiting     Home blood sugar records: fasting range: 200's  Episodes of hypoglycemia? No    Current insulin regiment: as directed Most Recent Eye Exam: not up to date Current exercise: no regular exercise Current diet habits: not asked  Pertinent Labs: Lab Results  Component Value Date   CHOL 230 (H) 05/10/2020   HDL 44 05/10/2020   LDLCALC 137 (H) 05/10/2020   TRIG 271 (H) 05/10/2020   CHOLHDL 5.2 (H) 05/10/2020   Lab Results  Component Value Date   NA 142 05/10/2020   K 4.5 05/10/2020   CREATININE 0.93 05/10/2020   GFRNONAA 91 05/10/2020   GFRAA 106 05/10/2020   GLUCOSE 90 05/10/2020     ---------------------------------------------------------------------------------------------------      Medications: Outpatient Medications Prior to Visit  Medication Sig  . albuterol (VENTOLIN HFA) 108 (90 Base) MCG/ACT inhaler Inhale 2 puffs into the lungs every 6 (six) hours as needed for wheezing or shortness of breath.  Marland Kitchen aspirin 81 MG tablet Take 81 mg by mouth daily.  Marland Kitchen azelastine (ASTELIN) 0.1 % nasal  spray Place 2 sprays into both nostrils 2 (two) times daily.  Marland Kitchen azelastine (OPTIVAR) 0.05 % ophthalmic solution drop affect eye twice a day  . Blood Glucose Monitoring Suppl (ONE TOUCH ULTRA 2) w/Device KIT USE AS DIRECTED  . celecoxib (CELEBREX) 100 MG capsule TAKE 1 CAPSULE BY MOUTH TWICE DAILY  . citalopram (CELEXA) 40 MG tablet Take 1 tablet (40 mg total) by mouth daily.  . cyclobenzaprine (FLEXERIL) 5 MG tablet Take 1 tablet (5 mg total) by mouth 3 (three) times daily as needed for muscle spasms. TAKE ONE TABLET BY MOUTH THREE TIMES A DAY AS NEEDED FOR MUSCLE SPASMS  . glipiZIDE (GLUCOTROL XL) 5 MG 24 hr tablet TAKE 1 TABLET BY MOUTH ONCE DAILY WITH BREAKFAST  . glucose blood (ONE TOUCH ULTRA TEST) test strip Check blood sugar 3 times daily E11.65 (uncontrolled insulin dependent diabetes)  . insulin lispro (HUMALOG KWIKPEN) 200 UNIT/ML KwikPen Use before meals up to three times a day. Take 6 units if sugar is over 200. 8 units if over 250 and 10 units if over 300.  . ketoconazole (NIZORAL) 2 % cream Apply 1 application topically daily.  Marland Kitchen linaclotide (LINZESS) 290 MCG CAPS capsule Take 290 mcg by mouth daily before breakfast.  . mometasone (ELOCON) 0.1 % cream One application daily as needed for rash  . nitroGLYCERIN (NITROSTAT) 0.4 MG SL tablet Place 1 tablet (0.4 mg total) under the tongue every  5 (five) minutes as needed for chest pain.  Marland Kitchen oxyCODONE (ROXICODONE) 15 MG immediate release tablet Take 1 tablet (15 mg total) by mouth every 4 (four) hours as needed for pain. PATIENT NEEDS TO SCHEDULE OFFICE VISIT FOR FOLLOW UP  . pioglitazone (ACTOS) 30 MG tablet Take 1 tablet (30 mg total) by mouth daily.  . promethazine (PHENERGAN) 12.5 MG tablet Take 1 tablet (12.5 mg total) by mouth every 6 (six) hours as needed.  . RABEprazole (ACIPHEX) 20 MG tablet TAKE 1 TABLET BY MOUTH ONCE DAILY  . rosuvastatin (CRESTOR) 40 MG tablet Take 1 tablet (40 mg total) by mouth daily.  . Semaglutide,0.25 or  0.5MG/DOS, (OZEMPIC, 0.25 OR 0.5 MG/DOSE,) 2 MG/1.5ML SOPN Inject 0.25 mg as directed once a week.  . traZODone (DESYREL) 100 MG tablet TAKE 2 TABLETS BY MOUTH AT BEDTIME  . Buprenorphine HCl-Naloxone HCl 8-2 MG FILM Place under the tongue.   No facility-administered medications prior to visit.    Review of Systems  Constitutional: Negative for activity change, appetite change and fatigue.  Eyes: Negative for visual disturbance.  Respiratory: Negative for chest tightness and shortness of breath.   Cardiovascular: Negative for chest pain, palpitations and leg swelling.    Last thyroid functions Lab Results  Component Value Date   TSH 4.45 01/15/2020       Objective    BP 129/61 (BP Location: Left Arm, Patient Position: Sitting, Cuff Size: Large)   Pulse 70   Temp 97.9 F (36.6 C) (Temporal)   Resp 16   Ht '5\' 8"'  (1.727 m)   Wt 221 lb (100.2 kg)   SpO2 98%   BMI 33.60 kg/m  Wt Readings from Last 3 Encounters:  07/05/20 221 lb (100.2 kg)  05/10/20 215 lb (97.5 kg)  02/09/20 230 lb (104.3 kg)      Physical Exam    General: Appearance:    Mildly obese male in no acute distress  Eyes:    PERRL, conjunctiva/corneas clear, EOM's intact       Lungs:     Clear to auscultation bilaterally, respirations unlabored  Heart:    Normal heart rate. Normal rhythm. No murmurs, rubs, or gallops.   MS:   All extremities are intact.   Neurologic:   Awake, alert, oriented x 3. No apparent focal neurological           defect.         Results for orders placed or performed in visit on 07/05/20  POCT glycosylated hemoglobin (Hb A1C)  Result Value Ref Range   Hemoglobin A1C 7.9 (A) 4.0 - 5.6 %   Est. average glucose Bld gHb Est-mCnc 180     Assessment & Plan     1. Type 2 diabetes mellitus with other specified complication, without long-term current use of insulin ([facility]) Doing much better with addition of low dose of Ozempic which he is tolerating well. He does have history chronic  constipation is on Linzess which is working well. Will keep Ozempic at 0.18m due to potential to aggravate constipation with higher doses. Reinforced importance of healthy diet.   2. Chronic midline low back pain without sciatica Fairly well controlled, but has taken a few extra doses over the last month.  - oxyCODONE (ROXICODONE) 15 MG immediate release tablet; Take 1 tablet (15 mg total) by mouth every 4 (four) hours as needed for pain. May refill today 07/05/2020  Dispense: 180 tablet; Refill: 0  3. Chest wall pain  - oxyCODONE (ROXICODONE)  15 MG immediate release tablet; Take 1 tablet (15 mg total) by mouth every 4 (four) hours as needed for pain. May refill today 07/05/2020  Dispense: 180 tablet; Refill: 0  4. Chronic left SI joint pain  - oxyCODONE (ROXICODONE) 15 MG immediate release tablet; Take 1 tablet (15 mg total) by mouth every 4 (four) hours as needed for pain. May refill today 07/05/2020  Dispense: 180 tablet; Refill: 0  5. Opioid dependence with opioid-induced disorder [facility]) Continue regular follow up with Dr. Jeralene Huff in [place] who is prescribing.  - Buprenorphine HCl-Naloxone HCl 8-2 MG FILM; Place under the tongue.   Future Appointments  Date Time Provider [place]  11/08/2020  3:20 PM Birdie Sons, MD [id number]         The entirety of the information documented in the History of Present Illness, Review of Systems and Physical Exam were personally obtained by me. Portions of this information were initially documented by the CMA and reviewed by me for thoroughness and accuracy.      [name], MD  [facility] [phone] (phone) [phone] (fax)  [place]

## 2020-07-05 NOTE — Telephone Encounter (Signed)
Vincent Hall from [facility] called and stated that the Rx for oxyCODONE (ROXICODONE) 15 MG immediate release tablet Needs Prior Authorization/ also the Rx didn't not go through to the pharmacy when sent today/ class states print / please advise

## 2020-07-05 NOTE — Patient Instructions (Signed)
Diabetes Mellitus and Nutrition, Adult When you have diabetes, or diabetes mellitus, it is very important to have healthy eating habits because your blood sugar (glucose) levels are greatly affected by what you eat and drink. Eating healthy foods in the right amounts, at about the same times every day, can help you:  Control your blood glucose.  Lower your risk of heart disease.  Improve your blood pressure.  Reach or maintain a healthy weight. What can affect my meal plan? Every person with diabetes is different, and each person has different needs for a meal plan. Your health care provider may recommend that you work with a dietitian to make a meal plan that is best for you. Your meal plan may vary depending on factors such as:  The calories you need.  The medicines you take.  Your weight.  Your blood glucose, blood pressure, and cholesterol levels.  Your activity level.  Other health conditions you have, such as heart or kidney disease. How do carbohydrates affect me? Carbohydrates, also called carbs, affect your blood glucose level more than any other type of food. Eating carbs naturally raises the amount of glucose in your blood. Carb counting is a method for keeping track of how many carbs you eat. Counting carbs is important to keep your blood glucose at a healthy level, especially if you use insulin or take certain oral diabetes medicines. It is important to know how many carbs you can safely have in each meal. This is different for every person. Your dietitian can help you calculate how many carbs you should have at each meal and for each snack. How does alcohol affect me? Alcohol can cause a sudden decrease in blood glucose (hypoglycemia), especially if you use insulin or take certain oral diabetes medicines. Hypoglycemia can be a life-threatening condition. Symptoms of hypoglycemia, such as sleepiness, dizziness, and confusion, are similar to symptoms of having too much  alcohol.  Do not drink alcohol if: ? Your health care provider tells you not to drink. ? You are pregnant, may be pregnant, or are planning to become pregnant.  If you drink alcohol: ? Do not drink on an empty stomach. ? Limit how much you use to:  0-1 drink a day for women.  0-2 drinks a day for men. ? Be aware of how much alcohol is in your drink. In the [place] one drink equals one 12 oz bottle of beer (355 mL), one 5 oz glass of wine (148 mL), or one 1 oz glass of hard liquor (44 mL). ? Keep yourself hydrated with water, diet soda, or unsweetened iced tea.  Keep in mind that regular soda, juice, and other mixers may contain a lot of sugar and must be counted as carbs. What are tips for following this plan? Reading food labels  Start by checking the serving size on the "Nutrition Facts" label of packaged foods and drinks. The amount of calories, carbs, fats, and other nutrients listed on the label is based on one serving of the item. Many items contain more than one serving per package.  Check the total grams (g) of carbs in one serving. You can calculate the number of servings of carbs in one serving by dividing the total carbs by 15. For example, if a food has 30 g of total carbs per serving, it would be equal to 2 servings of carbs.  Check the number of grams (g) of saturated fats and trans fats in one serving. Choose foods that have   a low amount or none of these fats.  Check the number of milligrams (mg) of salt (sodium) in one serving. Most people should limit total sodium intake to less than 2,300 mg per day.  Always check the nutrition information of foods labeled as "low-fat" or "nonfat." These foods may be higher in added sugar or refined carbs and should be avoided.  Talk to your dietitian to identify your daily goals for nutrients listed on the label. Shopping  Avoid buying canned, pre-made, or processed foods. These foods tend to be high in fat, sodium, and added  sugar.  Shop around the outside edge of the grocery store. This is where you will most often find fresh fruits and vegetables, bulk grains, fresh meats, and fresh dairy. Cooking  Use low-heat cooking methods, such as baking, instead of high-heat cooking methods like deep frying.  Cook using healthy oils, such as olive, canola, or sunflower oil.  Avoid cooking with butter, cream, or high-fat meats. Meal planning  Eat meals and snacks regularly, preferably at the same times every day. Avoid going long periods of time without eating.  Eat foods that are high in fiber, such as fresh fruits, vegetables, beans, and whole grains. Talk with your dietitian about how many servings of carbs you can eat at each meal.  Eat 4-6 oz (112-168 g) of lean protein each day, such as lean meat, chicken, fish, eggs, or tofu. One ounce (oz) of lean protein is equal to: ? 1 oz (28 g) of meat, chicken, or fish. ? 1 egg. ?  cup (62 g) of tofu.  Eat some foods each day that contain healthy fats, such as avocado, nuts, seeds, and fish.   What foods should I eat? Fruits Berries. Apples. Oranges. Peaches. Apricots. Plums. Grapes. Mango. Papaya. Pomegranate. Kiwi. Cherries. Vegetables Lettuce. Spinach. Leafy greens, including kale, chard, collard greens, and mustard greens. Beets. Cauliflower. Cabbage. Broccoli. Carrots. Green beans. Tomatoes. Peppers. Onions. Cucumbers. Brussels sprouts. Grains Whole grains, such as whole-wheat or whole-grain bread, crackers, tortillas, cereal, and pasta. Unsweetened oatmeal. Quinoa. Brown or wild rice. Meats and other proteins Seafood. Poultry without skin. Lean cuts of poultry and beef. Tofu. Nuts. Seeds. Dairy Low-fat or fat-free dairy products such as milk, yogurt, and cheese. The items listed above may not be a complete list of foods and beverages you can eat. Contact a dietitian for more information. What foods should I avoid? Fruits Fruits canned with  syrup. Vegetables Canned vegetables. Frozen vegetables with butter or cream sauce. Grains Refined white flour and flour products such as bread, pasta, snack foods, and cereals. Avoid all processed foods. Meats and other proteins Fatty cuts of meat. Poultry with skin. Breaded or fried meats. Processed meat. Avoid saturated fats. Dairy Full-fat yogurt, cheese, or milk. Beverages Sweetened drinks, such as soda or iced tea. The items listed above may not be a complete list of foods and beverages you should avoid. Contact a dietitian for more information. Questions to ask a health care provider  Do I need to meet with a diabetes educator?  Do I need to meet with a dietitian?  What number can I call if I have questions?  When are the best times to check my blood glucose? Where to find more information:  American Diabetes Association: diabetes.org  Academy of Nutrition and Dietetics: www.eatright.org  National Institute of Diabetes and Digestive and Kidney Diseases: www.niddk.nih.gov  Association of Diabetes Care and Education Specialists: www.diabeteseducator.org Summary  It is important to have healthy eating   habits because your blood sugar (glucose) levels are greatly affected by what you eat and drink.  A healthy meal plan will help you control your blood glucose and maintain a healthy lifestyle.  Your health care provider may recommend that you work with a dietitian to make a meal plan that is best for you.  Keep in mind that carbohydrates (carbs) and alcohol have immediate effects on your blood glucose levels. It is important to count carbs and to use alcohol carefully. This information is not intended to replace advice given to you by your health care provider. Make sure you discuss any questions you have with your health care provider. Document Revised: 03/04/2019 Document Reviewed: 03/04/2019 Elsevier Patient Education  2021 Elsevier Inc.  

## 2020-07-06 ENCOUNTER — Telehealth: Payer: Self-pay | Admitting: *Deleted

## 2020-07-06 NOTE — Telephone Encounter (Signed)
Patient scheduled for lung screening CT 07/16/20 3:00 PM, No change in insurance quit smoking in 2015.

## 2020-07-07 ENCOUNTER — Telehealth: Payer: Self-pay | Admitting: Family Medicine

## 2020-07-07 NOTE — Telephone Encounter (Signed)
Vincent Hall Chartered certified accountant at [employer] is calling to let md know they void e-scribed for oxycodone that sent on 07-05-2020  due to pt got hard copy and pt took hard copy to [facility] and they would not fill it either also rx was 7 day early

## 2020-07-14 NOTE — Telephone Encounter (Signed)
Dr. Caryn Section, Im working on the PA for this medication but the questions are more complex and Im not sure how to answer them. The PA is started and saved in covermy meds. Key: [id number]   Could you review the questions? Or should I request them to fax a hard copy of the PA for you to fill out?  Below are a few of the questions that I am unsure of.     Is the prescriber a board certified pain management specialist?  Yes  No/Uknown  Is this drug being prescribed in coordination with a board certified pain management specialist?  Yes  No/Uknown  Is there documentation that the provider has performed a quarterly reassessment of opioid therapy benefits/risks specific to the patient's diagnosis and treatment goals?  Yes  No/Uknown  Is there documentation that the provider has considered additional precautions that are intended to reduce the risk of serious harm associated with high dose opioids (for example, education and provision of naloxone)?  Yes  No/Uknown  Please provide clinical support for your patient to be on a daily dose of all opioid analgesics exceeding 120 MME (morphine milligram equivalents),including the following: 1) details for ALL alternative medications this patient has tried: drug name, date(s) taken and for how long, and what the documented results were of taking the drug(s), including any documented intolerances or adverse reactions your patient experienced; 2) details of any contraindications per FDA label or any other clinical reason yourpatient is not a candidate to use any other alternative medication; 3) and if the patient is currently taking the requested medication, then please specify how they have been receiving it (samples, paying out of pocket, etc.) and how long they have been on it (dates).

## 2020-07-16 ENCOUNTER — Ambulatory Visit: Admission: RE | Admit: 2020-07-16 | Payer: Managed Care, Other (non HMO) | Source: Ambulatory Visit

## 2020-07-21 ENCOUNTER — Other Ambulatory Visit: Payer: Self-pay | Admitting: Family Medicine

## 2020-07-21 DIAGNOSIS — G4700 Insomnia, unspecified: Secondary | ICD-10-CM

## 2020-07-23 ENCOUNTER — Ambulatory Visit
Admission: RE | Admit: 2020-07-23 | Discharge: 2020-07-23 | Disposition: A | Discharge status: Home / Self Care | Payer: Managed Care, Other (non HMO) | Source: Ambulatory Visit | Attending: Oncology | Admitting: Oncology

## 2020-07-23 ENCOUNTER — Other Ambulatory Visit: Payer: Self-pay

## 2020-07-23 DIAGNOSIS — Z87891 Personal history of nicotine dependence: Secondary | ICD-10-CM | POA: Insufficient documentation

## 2020-07-23 DIAGNOSIS — Z122 Encounter for screening for malignant neoplasm of respiratory organs: Secondary | ICD-10-CM | POA: Insufficient documentation

## 2020-07-29 ENCOUNTER — Encounter: Payer: Self-pay | Admitting: *Deleted

## 2020-08-09 ENCOUNTER — Telehealth: Payer: Self-pay | Admitting: Family Medicine

## 2020-08-09 DIAGNOSIS — M533 Sacrococcygeal disorders, not elsewhere classified: Secondary | ICD-10-CM

## 2020-08-09 DIAGNOSIS — M545 Low back pain, unspecified: Secondary | ICD-10-CM

## 2020-08-09 DIAGNOSIS — R0789 Other chest pain: Secondary | ICD-10-CM

## 2020-08-09 DIAGNOSIS — G8929 Other chronic pain: Secondary | ICD-10-CM

## 2020-08-09 MED ORDER — OXYCODONE HCL 15 MG PO TABS: 15.0000 mg | ORAL_TABLET | ORAL | 0 refills | Status: DC | PRN

## 2020-08-09 NOTE — Telephone Encounter (Signed)
Medication Refill - Medication: oxyCODONE (ROXICODONE) 15 MG immediate release tablet    Preferred Pharmacy (with phone number or street name):  [facility] - [place], [place] - [address] Phone:  [phone]  Fax:  [phone]       Agent: Please be advised that RX refills may take up to 3 business days. We ask that you follow-up with your pharmacy.  

## 2020-08-09 NOTE — Addendum Note (Signed)
Addended by: [name] E on: 08/09/2020 01:10 PM   Modules accepted: Orders

## 2020-08-09 NOTE — Telephone Encounter (Signed)
Requested medication (s) are due for refill today: yes  Requested medication (s) are on the active medication list: yes  Last refill: 07/05/2020  Future visit scheduled: yes  Notes to clinic:  this refill cannot be delegated    Requested Prescriptions  Pending Prescriptions Disp Refills   oxyCODONE (ROXICODONE) 15 MG immediate release tablet 180 tablet 0    Sig: Take 1 tablet (15 mg total) by mouth every 4 (four) hours as needed for pain. May refill today 07/05/2020      There is no refill protocol information for this order

## 2020-09-07 ENCOUNTER — Other Ambulatory Visit: Payer: Self-pay | Admitting: Family Medicine

## 2020-09-07 DIAGNOSIS — M545 Low back pain, unspecified: Secondary | ICD-10-CM

## 2020-09-07 DIAGNOSIS — M533 Sacrococcygeal disorders, not elsewhere classified: Secondary | ICD-10-CM

## 2020-09-07 DIAGNOSIS — R0789 Other chest pain: Secondary | ICD-10-CM

## 2020-09-07 DIAGNOSIS — G8929 Other chronic pain: Secondary | ICD-10-CM

## 2020-09-07 MED ORDER — OXYCODONE HCL 15 MG PO TABS: 15.0000 mg | ORAL_TABLET | ORAL | 0 refills | Status: DC | PRN

## 2020-09-07 NOTE — Telephone Encounter (Signed)
Requested medication (s) are due for refill today: yes   Requested medication (s) are on the active medication list: yes   Last refill: 08/09/2020  Future visit scheduled:  yes   Notes to clinic:  this refill cannot be delegated    Requested Prescriptions  Pending Prescriptions Disp Refills   oxyCODONE (ROXICODONE) 15 MG immediate release tablet 180 tablet 0    Sig: Take 1 tablet (15 mg total) by mouth every 4 (four) hours as needed for pain.      Not Delegated - Analgesics:  Opioid Agonists Failed - 09/07/2020 11:33 AM      Failed - This refill cannot be delegated      Failed - Urine Drug Screen completed in last 360 days      Passed - Valid encounter within last 6 months    Recent Outpatient Visits           2 months ago Type 2 diabetes mellitus with other specified complication, without long-term current use of insulin [facility])   [facility] Birdie Sons, MD   4 months ago Uncontrolled type 2 diabetes mellitus with hyperglycemia [facility])   [facility] Birdie Sons, MD   8 months ago Uncontrolled type 2 diabetes mellitus with hyperglycemia [facility])   [facility] Birdie Sons, MD   1 year ago Chronic bilateral low back pain with sciatica, sciatica laterality unspecified   [facility] Birdie Sons, MD   1 year ago Type 2 diabetes mellitus with other specified complication, without long-term current use of insulin [facility])   [facility] Birdie Sons, MD       Future Appointments             In 2 months Fisher, Kirstie Peri, MD [facility], [place]

## 2020-09-07 NOTE — Telephone Encounter (Signed)
Requested Prescriptions  Pending Prescriptions Disp Refills  . RABEprazole (ACIPHEX) 20 MG tablet [Pharmacy Med Name: RABEPRAZOLE SODIUM 20 MG DR TAB] 90 tablet 0    Sig: TAKE 1 TABLET BY MOUTH ONCE DAILY     Gastroenterology: Proton Pump Inhibitors Passed - 09/07/2020  7:29 PM      Passed - Valid encounter within last 12 months    Recent Outpatient Visits          2 months ago Type 2 diabetes mellitus with other specified complication, without long-term current use of insulin [facility])   [facility] Birdie Sons, MD   4 months ago Uncontrolled type 2 diabetes mellitus with hyperglycemia [facility])   [facility] Birdie Sons, MD   8 months ago Uncontrolled type 2 diabetes mellitus with hyperglycemia [facility])   [facility] Birdie Sons, MD   1 year ago Chronic bilateral low back pain with sciatica, sciatica laterality unspecified   [facility] Birdie Sons, MD   1 year ago Type 2 diabetes mellitus with other specified complication, without long-term current use of insulin [facility])   [facility] Birdie Sons, MD      Future Appointments            In 2 months Fisher, Kirstie Peri, MD [facility], [place]

## 2020-09-07 NOTE — Telephone Encounter (Signed)
Copied from [place] [id number]. Topic: Quick Communication - Rx Refill/Question >> Sep 07, 2020 11:25 AM Leward Quan A wrote: Medication: oxyCODONE (ROXICODONE) 15 MG immediate release tablet  Has the patient contacted their pharmacy? Yes.   (Agent: If no, request that the patient contact the pharmacy for the refill.) (Agent: If yes, when and what did the pharmacy advise?)  Preferred Pharmacy (with phone number or street name): [place], [place]  Phone:  [phone] Fax:  [phone]     Agent: Please be advised that RX refills may take up to 3 business days. We ask that you follow-up with your pharmacy.

## 2020-09-23 ENCOUNTER — Encounter: Payer: Self-pay | Admitting: Family Medicine

## 2020-09-23 DIAGNOSIS — I700 Atherosclerosis of aorta: Secondary | ICD-10-CM | POA: Insufficient documentation

## 2020-09-23 DIAGNOSIS — J439 Emphysema, unspecified: Secondary | ICD-10-CM | POA: Insufficient documentation

## 2020-10-06 ENCOUNTER — Other Ambulatory Visit: Payer: Self-pay | Admitting: Family Medicine

## 2020-10-06 DIAGNOSIS — R0789 Other chest pain: Secondary | ICD-10-CM

## 2020-10-06 DIAGNOSIS — G8929 Other chronic pain: Secondary | ICD-10-CM

## 2020-10-06 DIAGNOSIS — M545 Low back pain, unspecified: Secondary | ICD-10-CM

## 2020-10-06 NOTE — Telephone Encounter (Signed)
Requested medication (s) are due for refill today:  yes  Requested medication (s) are on the active medication list: yes   Last refill:  09/07/2020  Future visit scheduled: yes   Notes to clinic:  this refill cannot be delegated    Requested Prescriptions  Pending Prescriptions Disp Refills   oxyCODONE (ROXICODONE) 15 MG immediate release tablet 180 tablet 0    Sig: Take 1 tablet (15 mg total) by mouth every 4 (four) hours as needed for pain.      Not Delegated - Analgesics:  Opioid Agonists Failed - 10/06/2020 11:28 AM      Failed - This refill cannot be delegated      Failed - Urine Drug Screen completed in last 360 days      Passed - Valid encounter within last 6 months    Recent Outpatient Visits           3 months ago Type 2 diabetes mellitus with other specified complication, without long-term current use of insulin [facility])   [facility] Birdie Sons, MD   4 months ago Uncontrolled type 2 diabetes mellitus with hyperglycemia [facility])   [facility] Birdie Sons, MD   9 months ago Uncontrolled type 2 diabetes mellitus with hyperglycemia [facility])   [facility] Birdie Sons, MD   1 year ago Chronic bilateral low back pain with sciatica, sciatica laterality unspecified   [facility] Birdie Sons, MD   1 year ago Type 2 diabetes mellitus with other specified complication, without long-term current use of insulin [facility])   [facility] Birdie Sons, MD       Future Appointments             In 1 month Fisher, Kirstie Peri, MD [facility], [place]

## 2020-10-06 NOTE — Telephone Encounter (Signed)
Medication Refill - Medication: oxyCODONE (ROXICODONE) 15 MG immediate release tablet   Has the patient contacted their pharmacy? No. (Agent: If no, request that the patient contact the pharmacy for the refill.) (Agent: If yes, when and what did the pharmacy advise?)  Preferred Pharmacy (with phone number or street name):  [facility] [name], [place]  [place] [address]  Phone: [phone] Fax: [phone]    Agent: Please be advised that RX refills may take up to 3 business days. We ask that you follow-up with your pharmacy.

## 2020-10-07 MED ORDER — OXYCODONE HCL 15 MG PO TABS: 15.0000 mg | ORAL_TABLET | ORAL | 0 refills | Status: DC | PRN

## 2020-11-05 ENCOUNTER — Other Ambulatory Visit: Payer: Self-pay | Admitting: Family Medicine

## 2020-11-05 DIAGNOSIS — R0789 Other chest pain: Secondary | ICD-10-CM

## 2020-11-05 DIAGNOSIS — G8929 Other chronic pain: Secondary | ICD-10-CM

## 2020-11-05 DIAGNOSIS — M533 Sacrococcygeal disorders, not elsewhere classified: Secondary | ICD-10-CM

## 2020-11-05 NOTE — Telephone Encounter (Signed)
Copied from [place] [id number]. Topic: Quick Communication - Rx Refill/Question >> Nov 05, 2020  9:01 AM Leward Quan A wrote: Medication: oxyCODONE (ROXICODONE) 15 MG immediate release tablet  Has the patient contacted their pharmacy? No. (Agent: If no, request that the patient contact the pharmacy for the refill.) (Agent: If yes, when and what did the pharmacy advise?)  Preferred Pharmacy (with phone number or street name): [facility] [name], [place]  Phone:  [phone] Fax:  [phone]     Agent: Please be advised that RX refills may take up to 3 business days. We ask that you follow-up with your pharmacy.

## 2020-11-05 NOTE — Telephone Encounter (Signed)
Requested medication (s) are due for refill today - yes  Requested medication (s) are on the active medication list -yes  Future visit scheduled -yes  Last refill: 10/07/20  Notes to clinic: Request RF- non delegated Rx  Requested Prescriptions  Pending Prescriptions Disp Refills   oxyCODONE (ROXICODONE) 15 MG immediate release tablet 180 tablet 0    Sig: Take 1 tablet (15 mg total) by mouth every 4 (four) hours as needed for pain.      Not Delegated - Analgesics:  Opioid Agonists Failed - 11/05/2020  9:04 AM      Failed - This refill cannot be delegated      Failed - Urine Drug Screen completed in last 360 days      Passed - Valid encounter within last 6 months    Recent Outpatient Visits           4 months ago Type 2 diabetes mellitus with other specified complication, without long-term current use of insulin [facility])   [facility] Birdie Sons, MD   5 months ago Uncontrolled type 2 diabetes mellitus with hyperglycemia [facility])   [facility] Birdie Sons, MD   10 months ago Uncontrolled type 2 diabetes mellitus with hyperglycemia [facility])   [facility] Bird[facility] Sons, MD   1 year ago Chronic bilateral low back pain with sciatica, sciatica laterality unspecified   [facility] Birdie Sons, MD   1 year ago Type 2 diabetes mellitus with other specified complication, without long-term current use of insulin [facility])   [facility] Birdie Sons, MD       Future Appointments             In 3 days Fisher, Kirstie Peri, MD [facility]                 Requested Prescriptions  Pending Prescriptions Disp Refills   oxyCODONE (ROXICODONE) 15 MG immediate release tablet 180 tablet 0    Sig: Take 1 tablet (15 mg total) by mouth every 4 (four) hours as needed for pain.      Not Delegated - Analgesics:  Opioid Agonists Failed - 11/05/2020  9:04 AM      Failed - This refill cannot  be delegated      Failed - Urine Drug Screen completed in last 360 days      Passed - Valid encounter within last 6 months    Recent Outpatient Visits           4 months ago Type 2 diabetes mellitus with other specified complication, without long-term current use of insulin [facility])   [facility] Birdie Sons, MD   5 months ago Uncontrolled type 2 diabetes mellitus with hyperglycemia [facility])   [facility] Birdie Sons, MD   10 months ago Uncontrolled type 2 diabetes mellitus with hyperglycemia [facility])   [facility] Birdie Sons, MD   1 year ago Chronic bilateral low back pain with sciatica, sciatica laterality unspecified   [facility] Birdie Sons, MD   1 year ago Type 2 diabetes mellitus with other specified complication, without long-term current use of insulin [facility])   [facility] Birdie Sons, MD       Future Appointments             In 3 days Fisher, Kirstie Peri, MD [facility]

## 2020-11-08 ENCOUNTER — Other Ambulatory Visit: Payer: Self-pay

## 2020-11-08 ENCOUNTER — Ambulatory Visit (INDEPENDENT_AMBULATORY_CARE_PROVIDER_SITE_OTHER): Payer: BC Managed Care – PPO | Admitting: Family Medicine

## 2020-11-08 ENCOUNTER — Encounter: Payer: Self-pay | Admitting: Family Medicine

## 2020-11-08 VITALS — BP 111/67 | HR 61 | Wt 225.0 lb

## 2020-11-08 DIAGNOSIS — E1169 Type 2 diabetes mellitus with other specified complication: Secondary | ICD-10-CM | POA: Diagnosis not present

## 2020-11-08 DIAGNOSIS — M5440 Lumbago with sciatica, unspecified side: Secondary | ICD-10-CM

## 2020-11-08 DIAGNOSIS — G8929 Other chronic pain: Secondary | ICD-10-CM | POA: Diagnosis not present

## 2020-11-08 DIAGNOSIS — R0789 Other chest pain: Secondary | ICD-10-CM | POA: Diagnosis not present

## 2020-11-08 LAB — POCT GLYCOSYLATED HEMOGLOBIN (HGB A1C): Hemoglobin A1C: 7.5 % — AB (ref 4.0–5.6)

## 2020-11-08 MED ORDER — OXYCODONE HCL 15 MG PO TABS: 15.0000 mg | ORAL_TABLET | ORAL | 0 refills | Status: DC | PRN

## 2020-11-08 NOTE — Progress Notes (Signed)
Established patient visit   Patient: Vincent Hall   DOB: [birth year]   55 y.o. Male  MRN: [id number] Visit Date: 11/08/2020  Today's healthcare provider: [name], MD   No chief complaint on file.  Subjective    HPI  Diabetes Mellitus Type II, follow-up  Lab Results  Component Value Date   HGBA1C 7.9 (A) 07/05/2020   HGBA1C 10.2 (A) 05/10/2020   HGBA1C 8.0 (A) 01/06/2020   Last seen for diabetes 4 months ago.  Management since then includes continuing the same treatment. He reports excellent compliance with treatment. He is not having side effects.   Home blood sugar records: fasting range: mid to high 100's  Episodes of hypoglycemia? No    Current insulin regiment: Humalog Sliding Scale. Most Recent Eye Exam: Pt is due for an eye exam  --------------------------------------------------------------------------------------------------- Lipid/Cholesterol, follow-up  Last Lipid Panel: Lab Results  Component Value Date   CHOL 230 (H) 05/10/2020   LDLCALC 137 (H) 05/10/2020   HDL 44 05/10/2020   TRIG 271 (H) 05/10/2020    He was last seen for this 7 months ago.  Management since that visit includes; labs checked showing-cholesterol is much too high at 230, it needs to be under 130. Needs to start back on rosuvastatin.  He reports excellent compliance with treatment. He is not having side effects.   He is following a Regular diet. Current exercise:  exercising regularly  Last metabolic panel Lab Results  Component Value Date   GLUCOSE 90 05/10/2020   NA 142 05/10/2020   K 4.5 05/10/2020   BUN 8 05/10/2020   CREATININE 0.93 05/10/2020   GFRNONAA 91 05/10/2020   GFRAA 106 05/10/2020   CALCIUM 10.1 05/10/2020   AST 23 05/10/2020   ALT 21 05/10/2020   The 10-year ASCVD risk score Mikey Bussing DC Jr., et al., 2013) is: 13.5%  --------------------------------------------------------------------------------------------------- Follow up for Chronic  midline low back pain without sciatica:  The patient was last seen for this 4 months ago. Changes made at last visit include; Fairly well controlled.  He reports excellent compliance with treatment. He feels that condition is Unchanged. He is not having side effects.   -----------------------------------------------------------------------------------------       Medications: Outpatient Medications Prior to Visit  Medication Sig   albuterol (VENTOLIN HFA) 108 (90 Base) MCG/ACT inhaler Inhale 2 puffs into the lungs every 6 (six) hours as needed for wheezing or shortness of breath.   aspirin 81 MG tablet Take 81 mg by mouth daily.   azelastine (ASTELIN) 0.1 % nasal spray Place 2 sprays into both nostrils 2 (two) times daily.   azelastine (OPTIVAR) 0.05 % ophthalmic solution drop affect eye twice a day   Blood Glucose Monitoring Suppl (ONE TOUCH ULTRA 2) w/Device KIT USE AS DIRECTED   Buprenorphine HCl-Naloxone HCl 8-2 MG FILM Place under the tongue.   celecoxib (CELEBREX) 100 MG capsule TAKE 1 CAPSULE BY MOUTH TWICE DAILY   citalopram (CELEXA) 40 MG tablet Take 1 tablet (40 mg total) by mouth daily.   cyclobenzaprine (FLEXERIL) 5 MG tablet Take 1 tablet (5 mg total) by mouth 3 (three) times daily as needed for muscle spasms. TAKE ONE TABLET BY MOUTH THREE TIMES A DAY AS NEEDED FOR MUSCLE SPASMS   glipiZIDE (GLUCOTROL XL) 5 MG 24 hr tablet TAKE 1 TABLET BY MOUTH ONCE DAILY WITH BREAKFAST   glucose blood (ONE TOUCH ULTRA TEST) test strip Check blood sugar 3 times daily E11.65 (uncontrolled insulin dependent diabetes)  insulin lispro (HUMALOG KWIKPEN) 200 UNIT/ML KwikPen Use before meals up to three times a day. Take 6 units if sugar is over 200. 8 units if over 250 and 10 units if over 300.   ketoconazole (NIZORAL) 2 % cream Apply 1 application topically daily.   linaclotide (LINZESS) 290 MCG CAPS capsule Take 290 mcg by mouth daily before breakfast.   mometasone (ELOCON) 0.1 % cream One  application daily as needed for rash   nitroGLYCERIN (NITROSTAT) 0.4 MG SL tablet Place 1 tablet (0.4 mg total) under the tongue every 5 (five) minutes as needed for chest pain.   oxyCODONE (ROXICODONE) 15 MG immediate release tablet Take 1 tablet (15 mg total) by mouth every 4 (four) hours as needed for pain.   pioglitazone (ACTOS) 30 MG tablet Take 1 tablet (30 mg total) by mouth daily.   promethazine (PHENERGAN) 12.5 MG tablet Take 1 tablet (12.5 mg total) by mouth every 6 (six) hours as needed.   RABEprazole (ACIPHEX) 20 MG tablet TAKE 1 TABLET BY MOUTH ONCE DAILY   rosuvastatin (CRESTOR) 40 MG tablet Take 1 tablet (40 mg total) by mouth daily.   Semaglutide,0.25 or 0.5MG /DOS, (OZEMPIC, 0.25 OR 0.5 MG/DOSE,) 2 MG/1.5ML SOPN Inject 0.25 mg as directed once a week.   traZODone (DESYREL) 100 MG tablet TAKE 2 TABLETS BY MOUTH AT BEDTIME   No facility-administered medications prior to visit.    Review of Systems  Constitutional: Negative.  Negative for appetite change, chills and fever.  Respiratory: Negative.  Negative for chest tightness, shortness of breath and wheezing.   Cardiovascular:  Positive for leg swelling. Negative for chest pain and palpitations.  Gastrointestinal: Negative.  Negative for abdominal pain, nausea and vomiting.  Musculoskeletal:  Positive for back pain.  Skin:  Negative for wound.  Neurological:  Negative for dizziness, light-headedness, numbness and headaches.      Objective    BP 111/67 (BP Location: Right Arm, Patient Position: Sitting, Cuff Size: Large)   Pulse 61   Wt 225 lb (102.1 kg)   SpO2 98%   BMI 34.21 kg/m    Physical Exam  General appearance: Mildly obese male, cooperative and in no acute distress Head: Normocephalic, without obvious abnormality, atraumatic Respiratory: Respirations even and unlabored, normal respiratory rate Extremities: All extremities are intact.  Skin: Skin color, texture, turgor normal. No rashes seen  Psych:  Appropriate mood and affect. Neurologic: Mental status: Alert, oriented to person, place, and time, thought content appropriate.   Results for orders placed or performed in visit on 11/08/20  POCT glycosylated hemoglobin (Hb A1C)  Result Value Ref Range   Hemoglobin A1C 7.5 (A) 4.0 - 5.6 %    Assessment & Plan     1. Type 2 diabetes mellitus with other specified complication, without long-term current use of insulin (HCC) Doing much better with addition of low dose semaglutide. Considering history of chronic constipation will continue the 0.25 rather than titrated up.   2. Chest wall pain   3. Other chronic pain   4. Chronic bilateral low back pain with sciatica, sciatica laterality unspecified Is adequately controlled on current oxycodone dose.      The entirety of the information documented in the History of Present Illness, Review of Systems and Physical Exam were personally obtained by me. Portions of this information were initially documented by the CMA and reviewed by me for thoroughness and accuracy.     [name], MD  [facility] [phone] (phone) [phone] (fax)  [facility]

## 2020-11-27 ENCOUNTER — Emergency Department: Payer: BC Managed Care – PPO

## 2020-11-27 ENCOUNTER — Other Ambulatory Visit: Payer: Self-pay

## 2020-11-27 ENCOUNTER — Emergency Department
Admission: EM | Admit: 2020-11-27 | Discharge: 2020-11-27 | Disposition: A | Discharge status: Home / Self Care | Payer: BC Managed Care – PPO | Attending: Emergency Medicine | Admitting: Emergency Medicine

## 2020-11-27 DIAGNOSIS — Z951 Presence of aortocoronary bypass graft: Secondary | ICD-10-CM | POA: Insufficient documentation

## 2020-11-27 DIAGNOSIS — Z7984 Long term (current) use of oral hypoglycemic drugs: Secondary | ICD-10-CM | POA: Diagnosis not present

## 2020-11-27 DIAGNOSIS — Z794 Long term (current) use of insulin: Secondary | ICD-10-CM | POA: Diagnosis not present

## 2020-11-27 DIAGNOSIS — J45909 Unspecified asthma, uncomplicated: Secondary | ICD-10-CM | POA: Insufficient documentation

## 2020-11-27 DIAGNOSIS — I2510 Atherosclerotic heart disease of native coronary artery without angina pectoris: Secondary | ICD-10-CM | POA: Insufficient documentation

## 2020-11-27 DIAGNOSIS — Z7951 Long term (current) use of inhaled steroids: Secondary | ICD-10-CM | POA: Insufficient documentation

## 2020-11-27 DIAGNOSIS — R1032 Left lower quadrant pain: Secondary | ICD-10-CM | POA: Diagnosis not present

## 2020-11-27 DIAGNOSIS — E119 Type 2 diabetes mellitus without complications: Secondary | ICD-10-CM | POA: Insufficient documentation

## 2020-11-27 DIAGNOSIS — Z7982 Long term (current) use of aspirin: Secondary | ICD-10-CM | POA: Diagnosis not present

## 2020-11-27 DIAGNOSIS — R0689 Other abnormalities of breathing: Secondary | ICD-10-CM | POA: Diagnosis not present

## 2020-11-27 DIAGNOSIS — D72829 Elevated white blood cell count, unspecified: Secondary | ICD-10-CM | POA: Diagnosis not present

## 2020-11-27 DIAGNOSIS — R079 Chest pain, unspecified: Secondary | ICD-10-CM | POA: Diagnosis not present

## 2020-11-27 DIAGNOSIS — K760 Fatty (change of) liver, not elsewhere classified: Secondary | ICD-10-CM | POA: Diagnosis not present

## 2020-11-27 DIAGNOSIS — K529 Noninfective gastroenteritis and colitis, unspecified: Secondary | ICD-10-CM | POA: Insufficient documentation

## 2020-11-27 DIAGNOSIS — R739 Hyperglycemia, unspecified: Secondary | ICD-10-CM | POA: Diagnosis not present

## 2020-11-27 DIAGNOSIS — Z87891 Personal history of nicotine dependence: Secondary | ICD-10-CM | POA: Diagnosis not present

## 2020-11-27 DIAGNOSIS — R0789 Other chest pain: Secondary | ICD-10-CM | POA: Diagnosis not present

## 2020-11-27 LAB — COMPREHENSIVE METABOLIC PANEL
ALT: 26 U/L (ref 0–44)
AST: 22 U/L (ref 15–41)
Albumin: 4.4 g/dL (ref 3.5–5.0)
Alkaline Phosphatase: 73 U/L (ref 38–126)
Anion gap: 11 (ref 5–15)
BUN: 13 mg/dL (ref 6–20)
CO2: 22 mmol/L (ref 22–32)
Calcium: 9.4 mg/dL (ref 8.9–10.3)
Chloride: 101 mmol/L (ref 98–111)
Creatinine, Ser: 0.8 mg/dL (ref 0.61–1.24)
GFR, Estimated: >60 mL/min (ref >60)
Glucose, Bld: 415 mg/dL — ABNORMAL HIGH (ref 70–99)
Potassium: 3.8 mmol/L (ref 3.5–5.1)
Sodium: 134 mmol/L — ABNORMAL LOW (ref 135–145)
Total Bilirubin: 0.5 mg/dL (ref 0.3–1.2)
Total Protein: 7.6 g/dL (ref 6.5–8.1)

## 2020-11-27 LAB — APTT: aPTT: 25 seconds (ref 24–36)

## 2020-11-27 LAB — LIPASE, BLOOD: Lipase: 24 U/L (ref 11–51)

## 2020-11-27 LAB — CBC
HCT: 43.5 % (ref 39.0–52.0)
Hemoglobin: 14.9 g/dL (ref 13.0–17.0)
MCH: 26.9 pg (ref 26.0–34.0)
MCHC: 34.3 g/dL (ref 30.0–36.0)
MCV: 78.5 fL — ABNORMAL LOW (ref 80.0–100.0)
Platelets: 207 10*3/uL (ref 150–400)
RBC: 5.54 MIL/uL (ref 4.22–5.81)
RDW: 12.2 % (ref 11.5–15.5)
WBC: 13.8 10*3/uL — ABNORMAL HIGH (ref 4.0–10.5)
nRBC: 0 % (ref 0.0–0.2)

## 2020-11-27 LAB — TYPE AND SCREEN
ABO/RH(D): B POS
Antibody Screen: NEGATIVE

## 2020-11-27 LAB — PROTIME-INR
INR: 1.1 (ref 0.8–1.2)
Prothrombin Time: 13.9 seconds (ref 11.4–15.2)

## 2020-11-27 MED ORDER — ALUM & MAG HYDROXIDE-SIMETH 200-200-20 MG/5ML PO SUSP
30.0000 mL | Freq: Once | ORAL | Status: AC
  Administered 2020-11-27: 30 mL via ORAL
  Filled 2020-11-27: qty 30

## 2020-11-27 MED ORDER — MORPHINE SULFATE (PF) 4 MG/ML IV SOLN
4.0000 mg | Freq: Once | INTRAVENOUS | Status: AC
Start: 2020-11-27 — End: 2020-11-27
  Administered 2020-11-27: 4 mg via INTRAVENOUS
  Filled 2020-11-27: qty 1

## 2020-11-27 MED ORDER — AMOXICILLIN-POT CLAVULANATE 875-125 MG PO TABS: 1.0000 | ORAL_TABLET | Freq: Two times a day (BID) | ORAL | 0 refills | Status: AC

## 2020-11-27 MED ORDER — LIDOCAINE VISCOUS HCL 2 % MT SOLN
15.0000 mL | Freq: Once | OROMUCOSAL | Status: AC
  Administered 2020-11-27: 15 mL via ORAL
  Filled 2020-11-27: qty 15

## 2020-11-27 MED ORDER — ONDANSETRON HCL 4 MG/2ML IJ SOLN
4.0000 mg | Freq: Once | INTRAMUSCULAR | Status: AC
  Administered 2020-11-27: 4 mg via INTRAVENOUS
  Filled 2020-11-27: qty 2

## 2020-11-27 MED ORDER — DICYCLOMINE HCL 10 MG PO CAPS: 10.0000 mg | ORAL_CAPSULE | Freq: Four times a day (QID) | ORAL | 0 refills | Status: DC

## 2020-11-27 MED ORDER — IOHEXOL 350 MG/ML SOLN
80.0000 mL | Freq: Once | INTRAVENOUS | Status: AC | PRN
  Administered 2020-11-27: 80 mL via INTRAVENOUS

## 2020-11-27 MED ORDER — OMEPRAZOLE 40 MG PO CPDR: 40.0000 mg | DELAYED_RELEASE_CAPSULE | Freq: Every day | ORAL | 1 refills | Status: DC

## 2020-11-27 MED ORDER — PANTOPRAZOLE SODIUM 40 MG IV SOLR
40.0000 mg | Freq: Once | INTRAVENOUS | Status: AC
  Administered 2020-11-27: 40 mg via INTRAVENOUS
  Filled 2020-11-27: qty 40

## 2020-11-27 NOTE — ED Provider Notes (Signed)
[facility] Emergency Department Provider Note   ____________________________________________    I have reviewed the triage vital signs and the nursing notes.   HISTORY  Chief Complaint Abdominal pain     HPI Vincent Hall is a 55 y.o. male with history of bipolar disorder, CAD, diabetes, fatty liver who presents with complaints of abdominal pain.  Patient describes epigastric and left lower quadrant abdominal pain which started yesterday.  He reports slightly uncomfortable bowel movement.  Small amount of blood in stool.  1 episode of vomiting, nonbloody.  Reports he has had this couple of years ago and a GI cocktail seemed to help.  No history of abdominal surgery.  No fevers chills or cough.  Has not take anything for this  Past Medical History:  Diagnosis Date   Anxiety    Asthma    Bipolar 2 disorder ([place])    Coronary artery disease    Diabetes ([place])    Fatty liver    History of MI (myocardial infarction)    STEMI (ST elevation myocardial infarction) ([place]) 06/22/2013    Patient Active Problem List   Diagnosis Date Noted   Aortic atherosclerosis ([place]) 09/23/2020   Emphysema lung ([place]) 09/23/2020   OSA (obstructive sleep apnea) 06/23/2020   Vitamin D deficiency 05/11/2020   Narcotic dependence ([place]) 05/10/2020   Nonallopathic lesion of sacral region 01/15/2020   Nonallopathic lesion of lumbar region 01/15/2020   Chronic bilateral low back pain with sciatica 09/05/2019   Stomach irritation    History of colonic polyps    Colonic edema    Internal hemorrhoids    Pulmonary nodule 10/03/2018   Type 2 diabetes mellitus with other specified complication ([place]) 35/46/5681   Fatty liver 03/28/2018   Left hip pain 03/26/2018   Chronic left SI joint pain 03/26/2018   Hx of CABG (chronic thoracic/chest wall pain) 03/26/2018   Depression 01/25/2018   Suicide attempt ([place]) 01/08/2018   Benzodiazepine overdose 01/08/2018   Severe recurrent  major depression without psychotic features ([place]) 01/08/2018   Chronic pain 01/08/2018   Thrush, oral 12/03/2017   Current moderate episode of major depressive disorder without prior episode ([place]) 07/26/2017   Anxiety 10/28/2014   Arthralgia 10/28/2014   Chest wall pain 10/28/2014   Dysphagia 10/28/2014   Fatigue 10/28/2014   History of tobacco use 10/28/2014   Hyperlipidemia, mixed 10/28/2014   Insomnia 10/28/2014   Chronic idiopathic constipation 09/18/2014   Esophageal reflux 09/18/2014   Esophageal spasm 09/18/2014   Coronary artery disease 07/07/2013   Allergic rhinitis 06/18/2008    Past Surgical History:  Procedure Laterality Date   APPENDECTOMY     CARDIAC CATHETERIZATION  06/24/2013   COLONOSCOPY WITH PROPOFOL N/A 09/23/2014   Procedure: COLONOSCOPY WITH PROPOFOL;  Surgeon: Lucilla Lame, MD;  Location: [facility];  Service: Endoscopy;  Laterality: N/A;   COLONOSCOPY WITH PROPOFOL N/A 10/30/2018   Procedure: COLONOSCOPY WITH PROPOFOL;  Surgeon: Virgel Manifold, MD;  Location: [facility];  Service: Endoscopy;  Laterality: N/A;   CORONARY ANGIOPLASTY     stent placement to LAD   CORONARY ARTERY BYPASS GRAFT  07/31/2013   x 3 vessels   ESOPHAGOGASTRODUODENOSCOPY (EGD) WITH PROPOFOL N/A 10/30/2018   Procedure: ESOPHAGOGASTRODUODENOSCOPY (EGD) WITH PROPOFOL;  Surgeon: Virgel Manifold, MD;  Location: [facility] END[facility];  Service: Endoscopy;  Laterality: N/A;   FINGER SURGERY     NASAL SINUS SURGERY     POLYPECTOMY  09/23/2014   Procedure: POLYPECTOMY;  Surgeon:  Lucilla Lame, MD;  Location: [place];  Service: Endoscopy;;    Prior to Admission medications   Medication Sig Start Date End Date Taking? Authorizing Provider  amoxicillin-clavulanate (AUGMENTIN) 875-125 MG tablet Take 1 tablet by mouth 2 (two) times daily for 7 days. 11/27/20 12/04/20 Yes Lavonia Drafts, MD  dicyclomine (BENTYL) 10 MG capsule Take 1 capsule (10 mg total) by mouth 4 (four)  times daily for 14 days. 11/27/20 12/11/20 Yes Lavonia Drafts, MD  omeprazole (PRILOSEC) 40 MG capsule Take 1 capsule (40 mg total) by mouth daily. 11/27/20 01/26/21 Yes Lavonia Drafts, MD  albuterol (VENTOLIN HFA) 108 (90 Base) MCG/ACT inhaler Inhale 2 puffs into the lungs every 6 (six) hours as needed for wheezing or shortness of breath. 05/10/20   Birdie Sons, MD  aspirin 81 MG tablet Take 81 mg by mouth daily.    [provider]  azelastine (ASTELIN) 0.1 % nasal spray Place 2 sprays into both nostrils 2 (two) times daily. 05/10/20   Birdie Sons, MD  azelastine (OPTIVAR) 0.05 % ophthalmic solution drop affect eye twice a day 05/10/20   Birdie Sons, MD  Blood Glucose Monitoring Suppl (ONE TOUCH ULTRA 2) w/Device KIT USE AS DIRECTED 05/16/18   Birdie Sons, MD  Buprenorphine HCl-Naloxone HCl 8-2 MG FILM Place under the tongue. 06/23/20   [provider]  celecoxib (CELEBREX) 100 MG capsule TAKE 1 CAPSULE BY MOUTH TWICE DAILY 03/29/20   Birdie Sons, MD  citalopram (CELEXA) 40 MG tablet Take 1 tablet (40 mg total) by mouth daily. 05/10/20   Birdie Sons, MD  cyclobenzaprine (FLEXERIL) 5 MG tablet Take 1 tablet (5 mg total) by mouth 3 (three) times daily as needed for muscle spasms. TAKE ONE TABLET BY MOUTH THREE TIMES A DAY AS NEEDED FOR MUSCLE SPASMS 05/10/20   Birdie Sons, MD  glipiZIDE (GLUCOTROL XL) 5 MG 24 hr tablet TAKE 1 TABLET BY MOUTH ONCE DAILY WITH BREAKFAST 03/20/20   Birdie Sons, MD  glucose blood (ONE TOUCH ULTRA TEST) test strip Check blood sugar 3 times daily E11.65 (uncontrolled insulin dependent diabetes) 04/19/18   Birdie Sons, MD  insulin lispro (HUMALOG KWIKPEN) 200 UNIT/ML KwikPen Use before meals up to three times a day. Take 6 units if sugar is over 200. 8 units if over 250 and 10 units if over 300. 04/16/20   Fisher, Kirstie Peri, MD  ketoconazole (NIZORAL) 2 % cream Apply 1 application topically daily. 04/30/18   Birdie Sons, MD   linaclotide Rolan Lipa) 290 MCG CAPS capsule Take 290 mcg by mouth daily before breakfast.    [provider]  mometasone (ELOCON) 0.1 % cream One application daily as needed for rash 11/16/17   Carmon Ginsberg, PA  nitroGLYCERIN (NITROSTAT) 0.4 MG SL tablet Place 1 tablet (0.4 mg total) under the tongue every 5 (five) minutes as needed for chest pain. 05/10/20   Birdie Sons, MD  oxyCODONE (ROXICODONE) 15 MG immediate release tablet Take 1 tablet (15 mg total) by mouth every 4 (four) hours as needed for pain. 11/08/20   Birdie Sons, MD  pioglitazone (ACTOS) 30 MG tablet Take 1 tablet (30 mg total) by mouth daily. 04/23/20   Birdie Sons, MD  promethazine (PHENERGAN) 12.5 MG tablet Take 1 tablet (12.5 mg total) by mouth every 6 (six) hours as needed. 02/09/20   Merlyn Lot, MD  RABEprazole (ACIPHEX) 20 MG tablet TAKE 1 TABLET BY MOUTH ONCE DAILY 09/07/20  Birdie Sons, MD  rosuvastatin (CRESTOR) 40 MG tablet Take 1 tablet (40 mg total) by mouth daily. 10/09/19   Birdie Sons, MD  Semaglutide,0.25 or 0.5MG/DOS, (OZEMPIC, 0.25 OR 0.5 MG/DOSE,) 2 MG/1.5ML SOPN Inject 0.25 mg as directed once a week. 05/10/20   Birdie Sons, MD  traZODone (DESYREL) 100 MG tablet TAKE 2 TABLETS BY MOUTH AT BEDTIME 07/21/20   Birdie Sons, MD     Allergies Montelukast, Depakote [divalproex sodium], Invokana [canagliflozin], Metformin and related, Atorvastatin, and Singulair [montelukast sodium]  Family History  Problem Relation Age of Onset   Hyperlipidemia Mother    Heart attack Father    Hyperlipidemia Father    Diabetes Father    Heart disease Father     Social History Social History   Tobacco Use   Smoking status: Former    Packs/day: 1.50    Years: 30.00    Pack years: 45.00    Types: Cigarettes    Quit date: 08/08/2013    Years since quitting: 7.3   Smokeless tobacco: Never  Vaping Use   Vaping Use: Never used  Substance Use Topics   Alcohol use: No   Drug use:  Never    Review of Systems  Constitutional: No fever/chills Eyes: No visual changes.  ENT: No sore throat. Cardiovascular: Denies chest pain. Respiratory: Denies shortness of breath. Gastrointestinal: As above Genitourinary: Negative for dysuria. Musculoskeletal: Negative for back pain. Skin: Negative for rash. Neurological: Negative for headaches   ____________________________________________   PHYSICAL EXAM:  VITAL SIGNS: ED Triage Vitals  Enc Vitals Group     BP 11/27/20 1229 (!) 190/76     Pulse Rate 11/27/20 1221 82     Resp 11/27/20 1221 16     Temp 11/27/20 1221 97.6 F (36.4 C)     Temp Source 11/27/20 1221 Oral     SpO2 11/27/20 1220 100 %     Weight 11/27/20 1221 98.9 kg (218 lb)     Height 11/27/20 1221 1.727 m (5' 8")     Head Circumference --      Peak Flow --      Pain Score 11/27/20 1221 10     Pain Loc --      Pain Edu? --      Excl. in [place]? --     Constitutional: Alert and oriented. Eyes: Conjunctivae are normal.   Nose: No congestion/rhinnorhea. Mouth/Throat: Mucous membranes are moist.    Cardiovascular: Normal rate, regular rhythm. Grossly normal heart sounds.  Good peripheral circulation. Respiratory: Normal respiratory effort.  No retractions. Lungs CTAB. Gastrointestinal: Soft, mild tenderness in the epigastrium as well as the left lower quadrant and right lower quadrant  Musculoskeletal: No lower extremity tenderness nor edema.  Warm and well perfused Neurologic:  Normal speech and language. No gross focal neurologic deficits are appreciated.  Skin:  Skin is warm, dry and intact. No rash noted. Psychiatric: Mood and affect are normal. Speech and behavior are normal.  ____________________________________________   LABS (all labs ordered are listed, but only abnormal results are displayed)  Labs Reviewed  CBC - Abnormal; Notable for the following components:      Result Value   WBC 13.8 (*)    MCV 78.5 (*)    All other components  within normal limits  COMPREHENSIVE METABOLIC PANEL - Abnormal; Notable for the following components:   Sodium 134 (*)    Glucose, Bld 415 (*)    All other components within normal limits  LIPASE, BLOOD  PROTIME-INR  APTT  TYPE AND SCREEN   ____________________________________________  EKG  None ____________________________________________  RADIOLOGY  CT abdomen pelvis reviewed by me, consistent with colitis ____________________________________________   PROCEDURES  Procedure(s) performed: No  Procedures   Critical Care performed: No ____________________________________________   INITIAL IMPRESSION / ASSESSMENT AND PLAN / ED COURSE  Pertinent labs & imaging results that were available during my care of the patient were reviewed by me and considered in my medical decision making (see chart for details).   Patient presents with abdominal pain as detailed above.  Differential includes gastritis, colitis, diverticulitis, pancreatitis.  No significant right upper quadrant pain to suggest cholecystitis  Lab work is overall reassuring, mild elevation of white blood cell count is nonspecific, glucose is elevated we will treat with IV fluids  Will send for CT abdomen pelvis given essentially diffuse abdominal pain  Treat with IV morphine, IV Zofran  CT is most consistent with colitis which does fit the distribution of his pain.  We will start the patient on Augmentin, Bentyl, Protonix in case there is a component of gastritis.  Patient has requested GI cocktail.  Outpatient follow-up, emphasized the need for colonoscopy with GI, return precautions discussed.    ____________________________________________   FINAL CLINICAL IMPRESSION(S) / ED DIAGNOSES  Final diagnoses:  Colitis        Note:  This document was prepared using Dragon voice recognition software and may include unintentional dictation errors.    Lavonia Drafts, MD 11/27/20 [phone]

## 2020-11-27 NOTE — ED Triage Notes (Signed)
BIB [facility] from home. [place] stools since last night. Vomiting since this AM. Burning sensation in abdomen. HX of GERD. unable to have any of his medications today. BGL 454 HR 60. 12-lead normal.  Pt non compliant with insulin. States he ran out.

## 2020-11-27 NOTE — ED Notes (Signed)
MD at bedside. 

## 2020-11-29 ENCOUNTER — Telehealth: Payer: Self-pay

## 2020-11-29 NOTE — Telephone Encounter (Signed)
Called patient to schedule him an appointment as per Dr. Vicente Males. However, I had to leave him a voicemail to return our call so we could schedule him a follow up appointment for colitis with Dr. Vicente Males.

## 2020-12-05 ENCOUNTER — Other Ambulatory Visit: Payer: Self-pay | Admitting: Family Medicine

## 2020-12-05 DIAGNOSIS — E1169 Type 2 diabetes mellitus with other specified complication: Secondary | ICD-10-CM

## 2020-12-05 NOTE — Telephone Encounter (Signed)
Requested Prescriptions  Pending Prescriptions Disp Refills  . rosuvastatin (CRESTOR) 40 MG tablet [Pharmacy Med Name: ROSUVASTATIN CALCIUM 40 MG TAB] 90 tablet 0    Sig: TAKE 1 TABLET BY MOUTH ONCE DAILY     Cardiovascular:  Antilipid - Statins Failed - 12/05/2020  3:41 PM      Failed - Total Cholesterol in normal range and within 360 days    Cholesterol, Total  Date Value Ref Range Status  05/10/2020 230 (H) 100 - 199 mg/dL Final         Failed - LDL in normal range and within 360 days    LDL Cholesterol (Calc)  Date Value Ref Range Status  01/31/2017 124 (H) mg/dL (calc) Final    Comment:    Reference range: <100 . Desirable range <100 mg/dL for primary prevention;   <70 mg/dL for patients with CHD or diabetic patients  with > or = 2 CHD risk factors. Marland Kitchen LDL-C is now calculated using the Martin-Hopkins  calculation, which is a validated novel method providing  better accuracy than the Friedewald equation in the  estimation of LDL-C.  Cresenciano Genre et al. Annamaria Helling. [id number]): 2061-2068  (http://education.[employer].com/faq/FAQ164)    LDL Chol Calc (NIH)  Date Value Ref Range Status  05/10/2020 137 (H) 0 - 99 mg/dL Final         Failed - Triglycerides in normal range and within 360 days    Triglycerides  Date Value Ref Range Status  05/10/2020 271 (H) 0 - 149 mg/dL Final         Passed - HDL in normal range and within 360 days    HDL  Date Value Ref Range Status  05/10/2020 44 >39 mg/dL Final         Passed - Patient is not pregnant      Passed - Valid encounter within last 12 months    Recent Outpatient Visits          3 weeks ago Type 2 diabetes mellitus with other specified complication, without long-term current use of insulin ([place])   [facility] Birdie Sons, MD   5 months ago Type 2 diabetes mellitus with other specified complication, without long-term current use of insulin ([place])   [facility] Birdie Sons, MD   6  months ago Uncontrolled type 2 diabetes mellitus with hyperglycemia [facility])   [facility] Birdie Sons, MD   11 months ago Uncontrolled type 2 diabetes mellitus with hyperglycemia [facility])   [facility] Birdie Sons, MD   1 year ago Chronic bilateral low back pain with sciatica, sciatica laterality unspecified   [facility] Birdie Sons, MD      Future Appointments            In 3 months Fisher, Kirstie Peri, MD [facility]           . glipiZIDE (GLUCOTROL XL) 5 MG 24 hr tablet [Pharmacy Med Name: GLIPIZIDE ER 5 MG TAB] 90 tablet 0    Sig: TAKE 1 TABLET BY MOUTH ONCE DAILY WITH BREAKFAST     Endocrinology:  Diabetes - Sulfonylureas Passed - 12/05/2020  3:41 PM      Passed - HBA1C is between 0 and 7.9 and within 180 days    Hemoglobin A1C  Date Value Ref Range Status  11/08/2020 7.5 (A) 4.0 - 5.6 % Final    Comment:    Average 169   Hgb A1c MFr  Bld  Date Value Ref Range Status  03/25/2018 7.9 (H) 4.8 - 5.6 % Final    Comment:             Prediabetes: 5.7 - 6.4          Diabetes: >6.4          Glycemic control for adults with diabetes: <7.0          Passed - Valid encounter within last 6 months    Recent Outpatient Visits          3 weeks ago Type 2 diabetes mellitus with other specified complication, without long-term current use of insulin ([place])   [facility] Birdie Sons, MD   5 months ago Type 2 diabetes mellitus with other specified complication, without long-term current use of insulin [facility]   [facility] Birdie Sons, MD   6 months ago Uncontrolled type 2 diabetes mellitus with hyperglycemia [facility])   [facility] Birdie Sons, MD   11 months ago Uncontrolled type 2 diabetes mellitus with hyperglycemia [facility])   [facility] Birdie Sons, MD   1 year ago Chronic bilateral low back pain with sciatica, sciatica laterality  unspecified   [facility] Birdie Sons, MD      Future Appointments            In 3 months Fisher, Kirstie Peri, MD [facility] [place]

## 2020-12-06 NOTE — Telephone Encounter (Signed)
Patient called back and he scheduled an appointment for 01/25/2021 with Dr. Vicente Males.

## 2020-12-07 ENCOUNTER — Other Ambulatory Visit: Payer: Self-pay | Admitting: Family Medicine

## 2020-12-07 DIAGNOSIS — M545 Low back pain, unspecified: Secondary | ICD-10-CM

## 2020-12-07 DIAGNOSIS — G8929 Other chronic pain: Secondary | ICD-10-CM

## 2020-12-07 DIAGNOSIS — R0789 Other chest pain: Secondary | ICD-10-CM

## 2020-12-07 NOTE — Telephone Encounter (Signed)
Medication Refill - Medication: oxycodone   Has the patient contacted their pharmacy? No. Pt states that he always has to call the office to have this refilled. Please advise.  (Agent: If no, request that the patient contact the pharmacy for the refill.) (Agent: If yes, when and what did the pharmacy advise?)  Preferred Pharmacy (with phone number or street name): Agent: Please be [facility] [name], [place] [address] [place]  [place] [address]  Phone: [phone] Fax: [phone]  Hours: Not open 24 hours  sed that RX refills may take up to 3 business days. We ask that you follow-up with your pharmacy.

## 2020-12-07 NOTE — Telephone Encounter (Signed)
Requested medications are due for refill today.  yes  Requested medications are on the active medications list.  yes  Last refill. 11/08/2020  Future visit scheduled.   yes  Notes to clinic.  Medication not delegated. 

## 2020-12-08 MED ORDER — OXYCODONE HCL 15 MG PO TABS: 15.0000 mg | ORAL_TABLET | ORAL | 0 refills | Status: DC | PRN

## 2020-12-26 ENCOUNTER — Other Ambulatory Visit: Payer: Self-pay | Admitting: Family Medicine

## 2020-12-26 DIAGNOSIS — G4700 Insomnia, unspecified: Secondary | ICD-10-CM

## 2020-12-29 ENCOUNTER — Encounter (HOSPITAL_COMMUNITY): Payer: Self-pay | Admitting: Radiology

## 2021-01-05 ENCOUNTER — Other Ambulatory Visit: Payer: Self-pay | Admitting: Family Medicine

## 2021-01-05 DIAGNOSIS — R0789 Other chest pain: Secondary | ICD-10-CM

## 2021-01-05 DIAGNOSIS — G8929 Other chronic pain: Secondary | ICD-10-CM

## 2021-01-05 NOTE — Telephone Encounter (Signed)
Requested medication (s) are due for refill today - yes  Requested medication (s) are on the active medication list -yes  Future visit scheduled -yes  Last refill: 12/08/20 #180  Notes to clinic: Request RF: non delegated Rx  Requested Prescriptions  Pending Prescriptions Disp Refills   oxyCODONE (ROXICODONE) 15 MG immediate release tablet 180 tablet 0    Sig: Take 1 tablet (15 mg total) by mouth every 4 (four) hours as needed for pain.     Not Delegated - Analgesics:  Opioid Agonists Failed - 01/05/2021 11:39 AM      Failed - This refill cannot be delegated      Failed - Urine Drug Screen completed in last 360 days      Passed - Valid encounter within last 6 months    Recent Outpatient Visits           1 month ago Type 2 diabetes mellitus with other specified complication, without long-term current use of insulin ([place])   [facility] Birdie Sons, MD   6 months ago Type 2 diabetes mellitus with other specified complication, without long-term current use of insulin [facility])   [facility] Birdie Sons, MD   8 months ago Uncontrolled type 2 diabetes mellitus with hyperglycemia [facility])   [facility] Birdie Sons, MD   1 year ago Uncontrolled type 2 diabetes mellitus with hyperglycemia [facility])   [facility] Birdie Sons, MD   1 year ago Chronic bilateral low back pain with sciatica, sciatica laterality unspecified   [facility] Birdie Sons, MD       Future Appointments             In 2 months Fisher, Kirstie Peri, MD [facility]               Requested Prescriptions  Pending Prescriptions Disp Refills   oxyCODONE (ROXICODONE) 15 MG immediate release tablet 180 tablet 0    Sig: Take 1 tablet (15 mg total) by mouth every 4 (four) hours as needed for pain.     Not Delegated - Analgesics:  Opioid Agonists Failed - 01/05/2021 11:39 AM      Failed - This refill cannot  be delegated      Failed - Urine Drug Screen completed in last 360 days      Passed - Valid encounter within last 6 months    Recent Outpatient Visits           1 month ago Type 2 diabetes mellitus with other specified complication, without long-term current use of insulin ([place])   [facility] Birdie Sons, MD   6 months ago Type 2 diabetes mellitus with other specified complication, without long-term current use of insulin [facility])   [facility] Birdie Sons, MD   8 months ago Uncontrolled type 2 diabetes mellitus with hyperglycemia [facility])   [facility] Birdie Sons, MD   1 year ago Uncontrolled type 2 diabetes mellitus with hyperglycemia [facility])   [facility] Birdie Sons, MD   1 year ago Chronic bilateral low back pain with sciatica, sciatica laterality unspecified   [facility] Birdie Sons, MD       Future Appointments             In 2 months Fisher, Kirstie Peri, MD [facility]

## 2021-01-05 NOTE — Telephone Encounter (Signed)
LOV: 11/08/2020 NOV: 03/25/2021  Thanks,   -Mickel Baas

## 2021-01-05 NOTE — Telephone Encounter (Signed)
Medication Refill - Medication: oxyCODONE (ROXICODONE) 15 MG immediate release tablet   Has the patient contacted their pharmacy? Yes.   (Agent: If no, request that the patient contact the pharmacy for the refill.) (Agent: If yes, when and what did the pharmacy advise?)  Preferred Pharmacy (with phone number or street name):  [facility] [name], [place]  [place] [address]  Phone: [phone] Fax: [phone]   Has the patient been seen for an appointment in the last year OR does the patient have an upcoming appointment? Yes.    Agent: Please be advised that RX refills may take up to 3 business days. We ask that you follow-up with your pharmacy.

## 2021-01-06 MED ORDER — OXYCODONE HCL 15 MG PO TABS: 15.0000 mg | ORAL_TABLET | ORAL | 0 refills | Status: DC | PRN

## 2021-01-24 ENCOUNTER — Observation Stay
Admission: EM | Admit: 2021-01-24 | Discharge: 2021-01-26 | Disposition: A | Discharge status: Home / Self Care | Payer: BC Managed Care – PPO | Attending: Internal Medicine | Admitting: Internal Medicine

## 2021-01-24 ENCOUNTER — Other Ambulatory Visit: Payer: Self-pay

## 2021-01-24 ENCOUNTER — Emergency Department: Payer: BC Managed Care – PPO

## 2021-01-24 DIAGNOSIS — R1084 Generalized abdominal pain: Secondary | ICD-10-CM | POA: Diagnosis not present

## 2021-01-24 DIAGNOSIS — E1110 Type 2 diabetes mellitus with ketoacidosis without coma: Secondary | ICD-10-CM | POA: Diagnosis present

## 2021-01-24 DIAGNOSIS — E1310 Other specified diabetes mellitus with ketoacidosis without coma: Principal | ICD-10-CM

## 2021-01-24 DIAGNOSIS — Z7984 Long term (current) use of oral hypoglycemic drugs: Secondary | ICD-10-CM | POA: Insufficient documentation

## 2021-01-24 DIAGNOSIS — G8929 Other chronic pain: Secondary | ICD-10-CM | POA: Diagnosis present

## 2021-01-24 DIAGNOSIS — F3341 Major depressive disorder, recurrent, in partial remission: Secondary | ICD-10-CM | POA: Diagnosis not present

## 2021-01-24 DIAGNOSIS — Z79899 Other long term (current) drug therapy: Secondary | ICD-10-CM | POA: Insufficient documentation

## 2021-01-24 DIAGNOSIS — Z7982 Long term (current) use of aspirin: Secondary | ICD-10-CM | POA: Diagnosis not present

## 2021-01-24 DIAGNOSIS — J45909 Unspecified asthma, uncomplicated: Secondary | ICD-10-CM | POA: Insufficient documentation

## 2021-01-24 DIAGNOSIS — I2510 Atherosclerotic heart disease of native coronary artery without angina pectoris: Secondary | ICD-10-CM | POA: Diagnosis not present

## 2021-01-24 DIAGNOSIS — Z87891 Personal history of nicotine dependence: Secondary | ICD-10-CM | POA: Diagnosis not present

## 2021-01-24 DIAGNOSIS — F319 Bipolar disorder, unspecified: Secondary | ICD-10-CM | POA: Diagnosis present

## 2021-01-24 DIAGNOSIS — Z794 Long term (current) use of insulin: Secondary | ICD-10-CM | POA: Insufficient documentation

## 2021-01-24 DIAGNOSIS — K6389 Other specified diseases of intestine: Secondary | ICD-10-CM | POA: Diagnosis not present

## 2021-01-24 DIAGNOSIS — F32A Depression, unspecified: Secondary | ICD-10-CM | POA: Diagnosis present

## 2021-01-24 DIAGNOSIS — I700 Atherosclerosis of aorta: Secondary | ICD-10-CM | POA: Diagnosis not present

## 2021-01-24 DIAGNOSIS — M5440 Lumbago with sciatica, unspecified side: Secondary | ICD-10-CM | POA: Diagnosis not present

## 2021-01-24 DIAGNOSIS — R188 Other ascites: Secondary | ICD-10-CM | POA: Diagnosis not present

## 2021-01-24 DIAGNOSIS — F419 Anxiety disorder, unspecified: Secondary | ICD-10-CM | POA: Diagnosis present

## 2021-01-24 DIAGNOSIS — Z20822 Contact with and (suspected) exposure to covid-19: Secondary | ICD-10-CM | POA: Diagnosis not present

## 2021-01-24 DIAGNOSIS — I10 Essential (primary) hypertension: Secondary | ICD-10-CM | POA: Diagnosis not present

## 2021-01-24 DIAGNOSIS — K3189 Other diseases of stomach and duodenum: Secondary | ICD-10-CM | POA: Diagnosis not present

## 2021-01-24 DIAGNOSIS — R739 Hyperglycemia, unspecified: Secondary | ICD-10-CM | POA: Diagnosis not present

## 2021-01-24 LAB — COMPREHENSIVE METABOLIC PANEL
ALT: 24 U/L (ref 0–44)
AST: 23 U/L (ref 15–41)
Albumin: 5.2 g/dL — ABNORMAL HIGH (ref 3.5–5.0)
Alkaline Phosphatase: 72 U/L (ref 38–126)
Anion gap: 20 — ABNORMAL HIGH (ref 5–15)
BUN: 11 mg/dL (ref 6–20)
CO2: 19 mmol/L — ABNORMAL LOW (ref 22–32)
Calcium: 10.1 mg/dL (ref 8.9–10.3)
Chloride: 96 mmol/L — ABNORMAL LOW (ref 98–111)
Creatinine, Ser: 1.06 mg/dL (ref 0.61–1.24)
GFR, Estimated: >60 mL/min (ref >60)
Glucose, Bld: 428 mg/dL — ABNORMAL HIGH (ref 70–99)
Potassium: 4.2 mmol/L (ref 3.5–5.1)
Sodium: 135 mmol/L (ref 135–145)
Total Bilirubin: 1.1 mg/dL (ref 0.3–1.2)
Total Protein: 8.4 g/dL — ABNORMAL HIGH (ref 6.5–8.1)

## 2021-01-24 LAB — BASIC METABOLIC PANEL
Anion gap: 10 (ref 5–15)
Anion gap: 7 (ref 5–15)
Anion gap: 10 (ref 5–15)
BUN: 11 mg/dL (ref 6–20)
BUN: 11 mg/dL (ref 6–20)
BUN: 10 mg/dL (ref 6–20)
CO2: 24 mmol/L (ref 22–32)
CO2: 26 mmol/L (ref 22–32)
CO2: 23 mmol/L (ref 22–32)
Calcium: 9 mg/dL (ref 8.9–10.3)
Calcium: 9.1 mg/dL (ref 8.9–10.3)
Calcium: 9.2 mg/dL (ref 8.9–10.3)
Chloride: 102 mmol/L (ref 98–111)
Chloride: 104 mmol/L (ref 98–111)
Chloride: 102 mmol/L (ref 98–111)
Creatinine, Ser: 0.85 mg/dL (ref 0.61–1.24)
Creatinine, Ser: 0.93 mg/dL (ref 0.61–1.24)
Creatinine, Ser: 0.8 mg/dL (ref 0.61–1.24)
GFR, Estimated: >60 mL/min (ref >60)
GFR, Estimated: >60 mL/min (ref >60)
GFR, Estimated: >60 mL/min (ref >60)
Glucose, Bld: 250 mg/dL — ABNORMAL HIGH (ref 70–99)
Glucose, Bld: 200 mg/dL — ABNORMAL HIGH (ref 70–99)
Glucose, Bld: 191 mg/dL — ABNORMAL HIGH (ref 70–99)
Potassium: 3.7 mmol/L (ref 3.5–5.1)
Potassium: 3.5 mmol/L (ref 3.5–5.1)
Potassium: 3.6 mmol/L (ref 3.5–5.1)
Sodium: 136 mmol/L (ref 135–145)
Sodium: 137 mmol/L (ref 135–145)
Sodium: 135 mmol/L (ref 135–145)

## 2021-01-24 LAB — URINALYSIS, COMPLETE (UACMP) WITH MICROSCOPIC
Bacteria, UA: NONE SEEN
Bilirubin Urine: NEGATIVE
Glucose, UA: >=500 mg/dL — AB
Ketones, ur: 80 mg/dL — AB
Leukocytes,Ua: NEGATIVE
Nitrite: NEGATIVE
Protein, ur: 30 mg/dL — AB
Specific Gravity, Urine: 1.03 (ref 1.005–1.030)
Squamous Epithelial / HPF: NONE SEEN (ref 0–5)
pH: 7 (ref 5.0–8.0)

## 2021-01-24 LAB — CBG MONITORING, ED
Glucose-Capillary: 424 mg/dL — ABNORMAL HIGH (ref 70–99)
Glucose-Capillary: 363 mg/dL — ABNORMAL HIGH (ref 70–99)
Glucose-Capillary: 339 mg/dL — ABNORMAL HIGH (ref 70–99)
Glucose-Capillary: 245 mg/dL — ABNORMAL HIGH (ref 70–99)
Glucose-Capillary: 239 mg/dL — ABNORMAL HIGH (ref 70–99)
Glucose-Capillary: 227 mg/dL — ABNORMAL HIGH (ref 70–99)
Glucose-Capillary: 223 mg/dL — ABNORMAL HIGH (ref 70–99)
Glucose-Capillary: 173 mg/dL — ABNORMAL HIGH (ref 70–99)
Glucose-Capillary: 195 mg/dL — ABNORMAL HIGH (ref 70–99)

## 2021-01-24 LAB — RESP PANEL BY RT-PCR (FLU A&B, COVID) ARPGX2
Influenza A by PCR: NEGATIVE
Influenza B by PCR: NEGATIVE
SARS Coronavirus 2 by RT PCR: NEGATIVE

## 2021-01-24 LAB — MRSA NEXT GEN BY PCR, NASAL: MRSA by PCR Next Gen: NOT DETECTED

## 2021-01-24 LAB — CBC
HCT: 49.2 % (ref 39.0–52.0)
Hemoglobin: 16.7 g/dL (ref 13.0–17.0)
MCH: 26.7 pg (ref 26.0–34.0)
MCHC: 33.9 g/dL (ref 30.0–36.0)
MCV: 78.6 fL — ABNORMAL LOW (ref 80.0–100.0)
Platelets: 278 10*3/uL (ref 150–400)
RBC: 6.26 MIL/uL — ABNORMAL HIGH (ref 4.22–5.81)
RDW: 12.3 % (ref 11.5–15.5)
WBC: 14.9 10*3/uL — ABNORMAL HIGH (ref 4.0–10.5)
nRBC: 0 % (ref 0.0–0.2)

## 2021-01-24 LAB — BETA-HYDROXYBUTYRIC ACID
Beta-Hydroxybutyric Acid: 1.14 mmol/L — ABNORMAL HIGH (ref 0.05–0.27)
Beta-Hydroxybutyric Acid: 0.08 mmol/L (ref 0.05–0.27)
Beta-Hydroxybutyric Acid: 0.05 mmol/L (ref 0.05–0.27)
Beta-Hydroxybutyric Acid: 0.13 mmol/L (ref 0.05–0.27)

## 2021-01-24 LAB — GLUCOSE, CAPILLARY
Glucose-Capillary: 179 mg/dL — ABNORMAL HIGH (ref 70–99)
Glucose-Capillary: 160 mg/dL — ABNORMAL HIGH (ref 70–99)

## 2021-01-24 LAB — LIPASE, BLOOD: Lipase: 47 U/L (ref 11–51)

## 2021-01-24 LAB — HEMOGLOBIN A1C
Hgb A1c MFr Bld: 11.2 % — ABNORMAL HIGH (ref 4.8–5.6)
Mean Plasma Glucose: 275 mg/dL

## 2021-01-24 LAB — HIV ANTIBODY (ROUTINE TESTING W REFLEX): HIV Screen 4th Generation wRfx: NONREACTIVE

## 2021-01-24 MED ORDER — BUPRENORPHINE HCL-NALOXONE HCL 8-2 MG SL SUBL: 1.0000 | SUBLINGUAL_TABLET | Freq: Every day | SUBLINGUAL | Status: DC

## 2021-01-24 MED ORDER — CLONIDINE HCL 0.1 MG PO TABS
0.1000 mg | ORAL_TABLET | Freq: Once | ORAL | Status: AC
  Administered 2021-01-24: 0.1 mg via ORAL
  Filled 2021-01-24: qty 1

## 2021-01-24 MED ORDER — TRAZODONE HCL 100 MG PO TABS
200.0000 mg | ORAL_TABLET | Freq: Every day | ORAL | Status: DC
  Administered 2021-01-24 – 2021-01-25 (×2): 200 mg via ORAL
  Filled 2021-01-24 (×2): qty 2

## 2021-01-24 MED ORDER — INFLUENZA VAC SPLIT QUAD 0.5 ML IM SUSY
0.5000 mL | PREFILLED_SYRINGE | INTRAMUSCULAR | Status: DC
  Filled 2021-01-24: qty 0.5

## 2021-01-24 MED ORDER — ROSUVASTATIN CALCIUM 10 MG PO TABS
40.0000 mg | ORAL_TABLET | Freq: Every day | ORAL | Status: DC
  Administered 2021-01-24 – 2021-01-25 (×2): 40 mg via ORAL
  Filled 2021-01-24: qty 4
  Filled 2021-01-24: qty 2
  Filled 2021-01-24 (×2): qty 4

## 2021-01-24 MED ORDER — CITALOPRAM HYDROBROMIDE 20 MG PO TABS
40.0000 mg | ORAL_TABLET | Freq: Every day | ORAL | Status: DC
  Administered 2021-01-24 – 2021-01-25 (×2): 40 mg via ORAL
  Filled 2021-01-24 (×3): qty 2

## 2021-01-24 MED ORDER — MORPHINE SULFATE (PF) 2 MG/ML IV SOLN
2.0000 mg | INTRAVENOUS | Status: DC | PRN
  Administered 2021-01-24 – 2021-01-26 (×11): 2 mg via INTRAVENOUS
  Filled 2021-01-24 (×11): qty 1

## 2021-01-24 MED ORDER — IOHEXOL 300 MG/ML  SOLN
100.0000 mL | Freq: Once | INTRAMUSCULAR | Status: AC | PRN
  Administered 2021-01-24: 100 mL via INTRAVENOUS

## 2021-01-24 MED ORDER — LACTATED RINGERS IV SOLN: INTRAVENOUS | Status: DC

## 2021-01-24 MED ORDER — KETOROLAC TROMETHAMINE 30 MG/ML IJ SOLN
15.0000 mg | Freq: Once | INTRAMUSCULAR | Status: AC
  Administered 2021-01-24: 15 mg via INTRAVENOUS
  Filled 2021-01-24: qty 1

## 2021-01-24 MED ORDER — LACTATED RINGERS IV BOLUS
20.0000 mL/kg | Freq: Once | INTRAVENOUS | Status: AC
  Administered 2021-01-24: 1886 mL via INTRAVENOUS

## 2021-01-24 MED ORDER — METOCLOPRAMIDE HCL 5 MG/ML IJ SOLN
10.0000 mg | Freq: Four times a day (QID) | INTRAMUSCULAR | Status: DC | PRN
  Administered 2021-01-24 – 2021-01-26 (×4): 10 mg via INTRAVENOUS
  Filled 2021-01-24 (×4): qty 2

## 2021-01-24 MED ORDER — LIDOCAINE VISCOUS HCL 2 % MT SOLN
15.0000 mL | Freq: Once | OROMUCOSAL | Status: AC
  Administered 2021-01-24: 15 mL via ORAL
  Filled 2021-01-24: qty 15

## 2021-01-24 MED ORDER — SODIUM CHLORIDE 0.9 % IV BOLUS
1000.0000 mL | Freq: Once | INTRAVENOUS | Status: AC
  Administered 2021-01-24: 1000 mL via INTRAVENOUS

## 2021-01-24 MED ORDER — INSULIN REGULAR(HUMAN) IN NACL 100-0.9 UT/100ML-% IV SOLN
INTRAVENOUS | Status: DC
  Administered 2021-01-24: 8 [IU]/h via INTRAVENOUS
  Administered 2021-01-25: 2.6 [IU]/h via INTRAVENOUS
  Filled 2021-01-24 (×2): qty 100

## 2021-01-24 MED ORDER — ALBUTEROL SULFATE (2.5 MG/3ML) 0.083% IN NEBU: 3.0000 mL | INHALATION_SOLUTION | Freq: Four times a day (QID) | RESPIRATORY_TRACT | Status: DC | PRN

## 2021-01-24 MED ORDER — ALUM & MAG HYDROXIDE-SIMETH 200-200-20 MG/5ML PO SUSP
30.0000 mL | Freq: Once | ORAL | Status: AC
  Administered 2021-01-24: 30 mL via ORAL
  Filled 2021-01-24: qty 30

## 2021-01-24 MED ORDER — NITROGLYCERIN 0.4 MG SL SUBL: 0.4000 mg | SUBLINGUAL_TABLET | SUBLINGUAL | Status: DC | PRN

## 2021-01-24 MED ORDER — POTASSIUM CHLORIDE 10 MEQ/100ML IV SOLN
10.0000 meq | INTRAVENOUS | Status: DC
Start: 2021-01-24 — End: 2021-01-24
  Administered 2021-01-24: 10 meq via INTRAVENOUS

## 2021-01-24 MED ORDER — ASPIRIN EC 81 MG PO TBEC
81.0000 mg | DELAYED_RELEASE_TABLET | Freq: Every day | ORAL | Status: DC
  Administered 2021-01-24 – 2021-01-25 (×2): 81 mg via ORAL
  Filled 2021-01-24 (×4): qty 1

## 2021-01-24 MED ORDER — DEXTROSE 50 % IV SOLN: 0.0000 mL | INTRAVENOUS | Status: DC | PRN

## 2021-01-24 MED ORDER — PANTOPRAZOLE SODIUM 40 MG IV SOLR
40.0000 mg | INTRAVENOUS | Status: DC
  Administered 2021-01-24 – 2021-01-26 (×3): 40 mg via INTRAVENOUS
  Filled 2021-01-24 (×3): qty 40

## 2021-01-24 MED ORDER — MORPHINE SULFATE (PF) 4 MG/ML IV SOLN: 4.0000 mg | INTRAVENOUS | Status: DC | PRN

## 2021-01-24 MED ORDER — ENOXAPARIN SODIUM 60 MG/0.6ML IJ SOSY
0.5000 mg/kg | PREFILLED_SYRINGE | INTRAMUSCULAR | Status: DC
  Administered 2021-01-24 – 2021-01-25 (×2): 47.5 mg via SUBCUTANEOUS
  Filled 2021-01-24: qty 0.6
  Filled 2021-01-24: qty 0.47

## 2021-01-24 MED ORDER — ONDANSETRON 4 MG PO TBDP
4.0000 mg | ORAL_TABLET | Freq: Once | ORAL | Status: AC
  Administered 2021-01-24: 4 mg via ORAL
  Filled 2021-01-24: qty 1

## 2021-01-24 MED ORDER — CHLORHEXIDINE GLUCONATE CLOTH 2 % EX PADS
6.0000 | MEDICATED_PAD | Freq: Every day | CUTANEOUS | Status: DC
  Administered 2021-01-25: 6 via TOPICAL

## 2021-01-24 MED ORDER — POTASSIUM CHLORIDE 10 MEQ/100ML IV SOLN
10.0000 meq | INTRAVENOUS | Status: AC
  Administered 2021-01-24: 10 meq via INTRAVENOUS
  Filled 2021-01-24 (×2): qty 100

## 2021-01-24 MED ORDER — DEXTROSE IN LACTATED RINGERS 5 % IV SOLN: INTRAVENOUS | Status: DC

## 2021-01-24 NOTE — ED Notes (Signed)
Agbata MD made aware of pt wanting to apeak to her regarding pain medication

## 2021-01-24 NOTE — ED Notes (Addendum)
Patient transported to CT 

## 2021-01-24 NOTE — ED Triage Notes (Signed)
Pt comes via EMs with c/o abdominal pain. Pt sates 10/10 pain. EMs reports VSS, CBG-300. Pt is coming off of oxy per EMS and hasn't had any in last few days. Pt states he is withdrawing off of the meds. Pt usually takes 6 15 mg oxy per day.

## 2021-01-24 NOTE — H&P (Addendum)
History and Physical    Vincent Hall QGB:[id number] DOB: [birth year] DOA: 01/24/2021  PCP: Birdie Sons, MD   Patient coming from: Home  I have personally briefly reviewed patient's old medical records in [place]  Chief Complaint: Nausea/Vomiting  HPI: Vincent Hall is a 55 y.o. male with medical history significant for, chronic pain syndrome, insulin dependent diabetes mellitus, coronary artery disease and anxiety disorder who presents to the ER for evaluation of a 2-day history of abdominal pain which he rated 10 x 10 in intensity at its worst.  Pain is diffuse and is associated with nausea and vomiting.  Per patient he thought he was having opioid withdrawal since he ran out of his pain medication.  He usually takes 6 tablets of 15 mg oxycodone daily.  Patient's blood sugars was in the 300s per EMS. He denies having any fever or chills, no changes in his bowel habits, no leg swelling, no palpitations, no diaphoresis, no headache, no dizziness, no lightheadedness, no focal deficits, no blurred vision, no shortness of breath, no chest pain, no cough. Labs show sodium 135, potassium 4.2, chloride 96, bicarb 19, glucose 428, BUN 11, creatinine 1.06, calcium 10.1, anion gap 20, alkaline phosphatase 72, albumin 5.2, lipase 47, AST 23, ALT 24, total protein 8.4, white count 14.9, hemoglobin 16.7, hematocrit 49.2, MCV 78.6, RDW 12.3, platelet count 278, PT 13.9, INR 1.1,Beta hydroxybutyric acid 1.14 Respiratory viral panel is negative CT scan of abdomen and pelvis shows no acute findings   ED Course: Patient is a 55 year old male who presents to the ER for evaluation of nausea, vomiting and diffuse abdominal pain. He has a history of insulin-dependent diabetes mellitus and missed 2 doses of his insulin because he has not been able to eat or drink due to persistent nausea and vomiting.  Patient is in DKA and insulin drip has been started in the ER. He will be admitted to the  hospital for further evaluation.    Review of Systems: As per HPI otherwise all other systems reviewed and negative.    Past Medical History:  Diagnosis Date   Anxiety    Asthma    Bipolar 2 disorder ([place])    Coronary artery disease    Diabetes (HCC)    Fatty liver    History of MI (myocardial infarction)    STEMI (ST elevation myocardial infarction) ([place]) 06/22/2013    Past Surgical History:  Procedure Laterality Date   APPENDECTOMY     CARDIAC CATHETERIZATION  06/24/2013   COLONOSCOPY WITH PROPOFOL N/A 09/23/2014   Procedure: COLONOSCOPY WITH PROPOFOL;  Surgeon: Lucilla Lame, MD;  Location: [place];  Service: Endoscopy;  Laterality: N/A;   COLONOSCOPY WITH PROPOFOL N/A 10/30/2018   Procedure: COLONOSCOPY WITH PROPOFOL;  Surgeon: Virgel Manifold, MD;  Location: [facility];  Service: Endoscopy;  Laterality: N/A;   CORONARY ANGIOPLASTY     stent placement to LAD   CORONARY ARTERY BYPASS GRAFT  07/31/2013   x 3 vessels   ESOPHAGOGASTRODUODENOSCOPY (EGD) WITH PROPOFOL N/A 10/30/2018   Procedure: ESOPHAGOGASTRODUODENOSCOPY (EGD) WITH PROPOFOL;  Surgeon: Virgel Manifold, MD;  Location: [facility];  Service: Endoscopy;  Laterality: N/A;   FINGER SURGERY     NASAL SINUS SURGERY     POLYPECTOMY  09/23/2014   Procedure: POLYPECTOMY;  Surgeon: Lucilla Lame, MD;  Location: [place];  Service: Endoscopy;;     reports that he quit smoking about 7 years ago. His smoking use included cigarettes. He  has a 45.00 pack-year smoking history. He has never used smokeless tobacco. He reports that he does not drink alcohol and does not use drugs.  Allergies  Allergen Reactions   Montelukast Hives    Singular   Depakote [Divalproex Sodium] Rash   Invokana [Canagliflozin] Other (See Comments)    Balanitis    Metformin And Related     Taken off in in early 2021 due to upset stomach.    Atorvastatin Other (See Comments)    Muscle pain, neck stiffness    Singulair [Montelukast Sodium] Hives    Family History  Problem Relation Age of Onset   Hyperlipidemia Mother    Heart attack Father    Hyperlipidemia Father    Diabetes Father    Heart disease Father       Prior to Admission medications   Medication Sig Start Date End Date Taking? Authorizing Provider  albuterol (VENTOLIN HFA) 108 (90 Base) MCG/ACT inhaler Inhale 2 puffs into the lungs every 6 (six) hours as needed for wheezing or shortness of breath. 05/10/20  Yes Birdie Sons, MD  aspirin 81 MG tablet Take 81 mg by mouth daily.   Yes [provider]  Blood Glucose Monitoring Suppl (ONE TOUCH ULTRA 2) w/Device KIT USE AS DIRECTED 05/16/18  Yes Birdie Sons, MD  citalopram (CELEXA) 40 MG tablet Take 1 tablet (40 mg total) by mouth daily. 05/10/20  Yes Birdie Sons, MD  glipiZIDE (GLUCOTROL XL) 5 MG 24 hr tablet TAKE 1 TABLET BY MOUTH ONCE DAILY WITH BREAKFAST 12/05/20  Yes Birdie Sons, MD  glucose blood (ONE TOUCH ULTRA TEST) test strip Check blood sugar 3 times daily E11.65 (uncontrolled insulin dependent diabetes) 04/19/18  Yes Birdie Sons, MD  insulin lispro (HUMALOG KWIKPEN) 200 UNIT/ML KwikPen Use before meals up to three times a day. Take 6 units if sugar is over 200. 8 units if over 250 and 10 units if over 300. 04/16/20  Yes Fisher, Kirstie Peri, MD  nitroGLYCERIN (NITROSTAT) 0.4 MG SL tablet Place 1 tablet (0.4 mg total) under the tongue every 5 (five) minutes as needed for chest pain. 05/10/20  Yes Birdie Sons, MD  omeprazole (PRILOSEC) 40 MG capsule Take 1 capsule (40 mg total) by mouth daily. 11/27/20 01/26/21 Yes Lavonia Drafts, MD  oxyCODONE (ROXICODONE) 15 MG immediate release tablet Take 1 tablet (15 mg total) by mouth every 4 (four) hours as needed for pain. 01/06/21  Yes Birdie Sons, MD  pioglitazone (ACTOS) 30 MG tablet Take 1 tablet (30 mg total) by mouth daily. 04/23/20  Yes Birdie Sons, MD  rosuvastatin (CRESTOR) 40 MG tablet TAKE 1  TABLET BY MOUTH ONCE DAILY 12/05/20  Yes Birdie Sons, MD  Semaglutide,0.25 or 0.5MG/DOS, (OZEMPIC, 0.25 OR 0.5 MG/DOSE,) 2 MG/1.5ML SOPN Inject 0.25 mg as directed once a week. 05/10/20  Yes Birdie Sons, MD  traZODone (DESYREL) 100 MG tablet TAKE 2 TABLETS BY MOUTH AT BEDTIME 12/27/20  Yes Birdie Sons, MD  azelastine (ASTELIN) 0.1 % nasal spray Place 2 sprays into both nostrils 2 (two) times daily. Patient not taking: No sig reported 05/10/20   Birdie Sons, MD  azelastine (OPTIVAR) 0.05 % ophthalmic solution drop affect eye twice a day Patient not taking: No sig reported 05/10/20   Birdie Sons, MD  celecoxib (CELEBREX) 100 MG capsule TAKE 1 CAPSULE BY MOUTH TWICE DAILY Patient not taking: No sig reported 03/29/20   Birdie Sons, MD  cyclobenzaprine (FLEXERIL) 5 MG tablet Take 1 tablet (5 mg total) by mouth 3 (three) times daily as needed for muscle spasms. TAKE ONE TABLET BY MOUTH THREE TIMES A DAY AS NEEDED FOR MUSCLE SPASMS Patient not taking: No sig reported 05/10/20   Birdie Sons, MD  dicyclomine (BENTYL) 10 MG capsule Take 1 capsule (10 mg total) by mouth 4 (four) times daily for 14 days. 11/27/20 12/11/20  Lavonia Drafts, MD  ketoconazole (NIZORAL) 2 % cream Apply 1 application topically daily. Patient not taking: No sig reported 04/30/18   Birdie Sons, MD  mometasone (ELOCON) 0.1 % cream One application daily as needed for rash Patient not taking: Reported on 01/24/2021 11/16/17   Carmon Ginsberg, PA  promethazine (PHENERGAN) 12.5 MG tablet Take 1 tablet (12.5 mg total) by mouth every 6 (six) hours as needed. Patient not taking: No sig reported 02/09/20   Merlyn Lot, MD  RABEprazole (ACIPHEX) 20 MG tablet TAKE 1 TABLET BY MOUTH ONCE DAILY Patient not taking: No sig reported 09/07/20   Birdie Sons, MD    Physical Exam: Vitals:   01/24/21 0748 01/24/21 1100  BP: (!) 150/81 (!) 175/81  Pulse: 75 62  Resp: 20 (!) 26  Temp: 98.3 F (36.8 C)    TempSrc: Oral   SpO2: 100% 100%  Weight: 94.3 kg   Height: _0  (1.727 m)      Vitals:   01/24/21 0748 01/24/21 1100  BP: (!) 150/81 (!) 175/81  Pulse: 75 62  Resp: 20 (!) 26  Temp: 98.3 F (36.8 C)   TempSrc: Oral   SpO2: 100% 100%  Weight: 94.3 kg   Height: _1  (1.727 m)       Constitutional: Alert and oriented x 3 . Not in any apparent distress HEENT:      Head: Normocephalic and atraumatic.         Eyes: PERLA, EOMI, Conjunctivae are normal. Sclera is non-icteric.       Mouth/Throat: Mucous membranes are moist.       Neck: Supple with no signs of meningismus. Cardiovascular: Regular rate and rhythm. No murmurs, gallops, or rubs. 2+ symmetrical distal pulses are present . No JVD. No LE edema Respiratory: Respiratory effort normal .Lungs sounds clear bilaterally. No wheezes, crackles, or rhonchi.  Gastrointestinal: Soft, non tender, and non distended with positive bowel sounds.  Central adiposity Genitourinary: No CVA tenderness. Musculoskeletal: Nontender with normal range of motion in all extremities. No cyanosis, or erythema of extremities. Neurologic:  Face is symmetric. Moving all extremities. No gross focal neurologic deficits . Skin: Skin is warm, dry.  No rash or ulcers Psychiatric: Mood and affect are normal    Labs on Admission: I have personally reviewed following labs and imaging studies  CBC: Recent Labs  Lab 01/24/21 0748  WBC 14.9*  HGB 16.7  HCT 49.2  MCV 78.6*  PLT 379   Basic Metabolic Panel: Recent Labs  Lab 01/24/21 0748  NA 135  K 4.2  CL 96*  CO2 19*  GLUCOSE 428*  BUN 11  CREATININE 1.06  CALCIUM 10.1   GFR: Estimated Creatinine Clearance: 86.7 mL/min (by C-G formula based on SCr of 1.06 mg/dL). Liver Function Tests: Recent Labs  Lab 01/24/21 0748  AST 23  ALT 24  ALKPHOS 72  BILITOT 1.1  PROT 8.4*  ALBUMIN 5.2*   Recent Labs  Lab 01/24/21 0748  LIPASE 47   No results for input(s): AMMONIA in the last 168  hours.  Coagulation Profile: No results for input(s): INR, PROTIME in the last 168 hours. Cardiac Enzymes: No results for input(s): CKTOTAL, CKMB, CKMBINDEX, TROPONINI in the last 168 hours. BNP (last 3 results) No results for input(s): PROBNP in the last 8760 hours. HbA1C: No results for input(s): HGBA1C in the last 72 hours. CBG: Recent Labs  Lab 01/24/21 1058 01/24/21 1136 01/24/21 1248  GLUCAP 424* 363* 339*   Lipid Profile: No results for input(s): CHOL, HDL, LDLCALC, TRIG, CHOLHDL, LDLDIRECT in the last 72 hours. Thyroid Function Tests: No results for input(s): TSH, T4TOTAL, FREET4, T3FREE, THYROIDAB in the last 72 hours. Anemia Panel: No results for input(s): VITAMINB12, FOLATE, FERRITIN, TIBC, IRON, RETICCTPCT in the last 72 hours. Urine analysis:    Component Value Date/Time   COLORURINE YELLOW (A) 01/24/2021 0748   APPEARANCEUR CLEAR (A) 01/24/2021 0748   LABSPEC 1.030 01/24/2021 0748   PHURINE 7.0 01/24/2021 0748   GLUCOSEU >=500 (A) 01/24/2021 0748   HGBUR MODERATE (A) 01/24/2021 0748   BILIRUBINUR NEGATIVE 01/24/2021 0748   KETONESUR 80 (A) 01/24/2021 0748   PROTEINUR 30 (A) 01/24/2021 0748   NITRITE NEGATIVE 01/24/2021 0748   LEUKOCYTESUR NEGATIVE 01/24/2021 0748    Radiological Exams on Admission: CT ABDOMEN PELVIS W CONTRAST  Result Date: 01/24/2021 CLINICAL DATA:  Abdominal pain, possible abscess, possible obstruction. Withdrawal symptoms. EXAM: CT ABDOMEN AND PELVIS WITH CONTRAST TECHNIQUE: Multidetector CT imaging of the abdomen and pelvis was performed using the standard protocol following bolus administration of intravenous contrast. CONTRAST:  153m OMNIPAQUE IOHEXOL 300 MG/ML  SOLN COMPARISON:  11/27/2020 FINDINGS: Lower chest: Coronary calcifications. Previous CABG. No pleural or pericardial effusion. Visualized lung bases clear. Hepatobiliary: No focal liver abnormality is seen. No gallstones, gallbladder wall thickening, or biliary dilatation.  Pancreas: Unremarkable. No pancreatic ductal dilatation or surrounding inflammatory changes. Spleen: Normal in size without focal abnormality. Adrenals/Urinary Tract: Adrenal glands are unremarkable. Kidneys are normal, without renal calculi, focal lesion, or hydronephrosis. Bladder is unremarkable. Stomach/Bowel: Partial distention of the stomach by fluid. The small bowel is nondilated. Post appendectomy. The colon is nondilated, unremarkable. Vascular/Lymphatic: Moderate aortoiliac calcified plaque without aneurysm. Portal vein patent. No abdominal or pelvic adenopathy. Reproductive: Prostate is unremarkable. Other: No ascites.  No free air. Musculoskeletal: Sternotomy wires. No fracture or worrisome bone lesion. IMPRESSION: 1. No acute findings. 2.  Aortic Atherosclerosis (ICD10-170.0). Electronically Signed   By: DLucrezia EuropeM.D.   On: 01/24/2021 11:23     Assessment/Plan Principal Problem:   DKA (diabetic ketoacidosis) ([place] Active Problems:   Coronary artery disease   Anxiety   Depression   Chronic bilateral low back pain with sciatica      Patient is a 55year old male with a history of insulin-dependent diabetes mellitus who presents to the ER for evaluation of abdominal pain, nausea and vomiting.  He is in DKA.      Diabetic ketoacidosis Secondary to medication noncompliance Continue insulin drip per protocol Check and supplement electrolytes as well as beta hydroxy butyrate acid levels     Anxiety and depression Continue citalopram and trazodone     History of coronary artery disease Continue Crestor and aspirin    Chronic pain syndrome Hold oxycodone due to nausea and vomiting Place patient on PRN IV Morphine    DVT prophylaxis: Lovenox  Code Status: full code  Family Communication: Greater than 50% of time was spent discussing patient's condition and plan of care with him at the bedside.  All questions and concerns have been addressed.  He  verbalizes  understanding and agrees with the plan.  CODE STATUS was discussed and he is a full code. Disposition Plan: Back to previous home environment Consults called: none  Status:At the time of admission, it appears that the appropriate admission status for this patient is inpatient. This is judged to be reasonable and necessary to provide the required intensity of service to ensure the patient's safety given the presenting symptoms, physical exam findings, and initial radiographic and laboratory data in the context of their comorbid conditions. Patient requires inpatient status due to high intensity of service, high risk for further deterioration and high frequency of surveillance required.     Collier Bullock MD [facility]     01/24/2021, 1:09 PM

## 2021-01-24 NOTE — ED Notes (Signed)
Report called to [facility] [place] ICU. Okay to bring pt up to room.

## 2021-01-24 NOTE — ED Provider Notes (Signed)
[facility] Emergency Department Provider Note    Event Date/Time   First MD Initiated Contact with Patient 01/24/21 [phone]     (approximate)  I have reviewed the triage vital signs and the nursing notes.   HISTORY  Chief Complaint Abdominal Pain    HPI Vincent Hall is a 55 y.o. male past medical history as listed below as well as admitted opioid dependence presents to the ER for evaluation of generalized abdominal pain associate with nausea vomiting requesting GI cocktail as well as pain medication.  States he wants to get off of his pain medications but is concerned that he is withdrawing.  Is having trouble keeping any food down.  No measured fevers but has had some chills.  Denies any pain radiating through to his back.  No cough or congestion.  Past Medical History:  Diagnosis Date   Anxiety    Asthma    Bipolar 2 disorder ([place])    Coronary artery disease    Diabetes ([place])    Fatty liver    History of MI (myocardial infarction)    STEMI (ST elevation myocardial infarction) ([place]) 06/22/2013   Family History  Problem Relation Age of Onset   Hyperlipidemia Mother    Heart attack Father    Hyperlipidemia Father    Diabetes Father    Heart disease Father    Past Surgical History:  Procedure Laterality Date   APPENDECTOMY     CARDIAC CATHETERIZATION  06/24/2013   COLONOSCOPY WITH PROPOFOL N/A 09/23/2014   Procedure: COLONOSCOPY WITH PROPOFOL;  Surgeon: Lucilla Lame, MD;  Location: [place];  Service: Endoscopy;  Laterality: N/A;   COLONOSCOPY WITH PROPOFOL N/A 10/30/2018   Procedure: COLONOSCOPY WITH PROPOFOL;  Surgeon: Virgel Manifold, MD;  Location: [facility] ENDOSCOPY;  Service: Endoscopy;  Laterality: N/A;   CORONARY ANGIOPLASTY     stent placement to LAD   CORONARY ARTERY BYPASS GRAFT  07/31/2013   x 3 vessels   ESOPHAGOGASTRODUODENOSCOPY (EGD) WITH PROPOFOL N/A 10/30/2018   Procedure: ESOPHAGOGASTRODUODENOSCOPY (EGD) WITH  PROPOFOL;  Surgeon: Virgel Manifold, MD;  Location: [facility] ENDOSCOPY;  Service: Endoscopy;  Laterality: N/A;   FINGER SURGERY     NASAL SINUS SURGERY     POLYPECTOMY  09/23/2014   Procedure: POLYPECTOMY;  Surgeon: Lucilla Lame, MD;  Location: [place];  Service: Endoscopy;;   Patient Active Problem List   Diagnosis Date Noted   Aortic atherosclerosis ([place]) 09/23/2020   Emphysema lung ([place]) 09/23/2020   OSA (obstructive sleep apnea) 06/23/2020   Vitamin D deficiency 05/11/2020   Narcotic dependence ([place]) 05/10/2020   Nonallopathic lesion of sacral region 01/15/2020   Nonallopathic lesion of lumbar region 01/15/2020   Chronic bilateral low back pain with sciatica 09/05/2019   Stomach irritation    History of colonic polyps    Colonic edema    Internal hemorrhoids    Pulmonary nodule 10/03/2018   Type 2 diabetes mellitus with other specified complication ([place]) 50/12/3816   Fatty liver 03/28/2018   Left hip pain 03/26/2018   Chronic left SI joint pain 03/26/2018   Hx of CABG (chronic thoracic/chest wall pain) 03/26/2018   Depression 01/25/2018   Suicide attempt ([place]) 01/08/2018   Benzodiazepine overdose 01/08/2018   Severe recurrent major depression without psychotic features ([place]) 01/08/2018   Chronic pain 01/08/2018   Thrush, oral 12/03/2017   Current moderate episode of major depressive disorder without prior episode ([place]) 07/26/2017   Anxiety 10/28/2014   Arthralgia 10/28/2014  Chest wall pain 10/28/2014   Dysphagia 10/28/2014   Fatigue 10/28/2014   History of tobacco use 10/28/2014   Hyperlipidemia, mixed 10/28/2014   Insomnia 10/28/2014   Chronic idiopathic constipation 09/18/2014   Esophageal reflux 09/18/2014   Esophageal spasm 09/18/2014   Coronary artery disease 07/07/2013   Allergic rhinitis 06/18/2008      Prior to Admission medications   Medication Sig Start Date End Date Taking? Authorizing Provider  albuterol (VENTOLIN HFA) 108 (90 Base)  MCG/ACT inhaler Inhale 2 puffs into the lungs every 6 (six) hours as needed for wheezing or shortness of breath. 05/10/20   Birdie Sons, MD  aspirin 81 MG tablet Take 81 mg by mouth daily.    [provider]  azelastine (ASTELIN) 0.1 % nasal spray Place 2 sprays into both nostrils 2 (two) times daily. 05/10/20   Birdie Sons, MD  azelastine (OPTIVAR) 0.05 % ophthalmic solution drop affect eye twice a day 05/10/20   Birdie Sons, MD  Blood Glucose Monitoring Suppl (ONE TOUCH ULTRA 2) w/Device KIT USE AS DIRECTED 05/16/18   Birdie Sons, MD  Buprenorphine HCl-Naloxone HCl 8-2 MG FILM Place under the tongue. 06/23/20   [provider]  celecoxib (CELEBREX) 100 MG capsule TAKE 1 CAPSULE BY MOUTH TWICE DAILY 03/29/20   Birdie Sons, MD  citalopram (CELEXA) 40 MG tablet Take 1 tablet (40 mg total) by mouth daily. 05/10/20   Birdie Sons, MD  cyclobenzaprine (FLEXERIL) 5 MG tablet Take 1 tablet (5 mg total) by mouth 3 (three) times daily as needed for muscle spasms. TAKE ONE TABLET BY MOUTH THREE TIMES A DAY AS NEEDED FOR MUSCLE SPASMS 05/10/20   Birdie Sons, MD  dicyclomine (BENTYL) 10 MG capsule Take 1 capsule (10 mg total) by mouth 4 (four) times daily for 14 days. 11/27/20 12/11/20  Lavonia Drafts, MD  glipiZIDE (GLUCOTROL XL) 5 MG 24 hr tablet TAKE 1 TABLET BY MOUTH ONCE DAILY WITH BREAKFAST 12/05/20   Birdie Sons, MD  glucose blood (ONE TOUCH ULTRA TEST) test strip Check blood sugar 3 times daily E11.65 (uncontrolled insulin dependent diabetes) 04/19/18   Birdie Sons, MD  insulin lispro (HUMALOG KWIKPEN) 200 UNIT/ML KwikPen Use before meals up to three times a day. Take 6 units if sugar is over 200. 8 units if over 250 and 10 units if over 300. 04/16/20   Fisher, Kirstie Peri, MD  ketoconazole (NIZORAL) 2 % cream Apply 1 application topically daily. 04/30/18   Birdie Sons, MD  linaclotide Rolan Lipa) 290 MCG CAPS capsule Take 290 mcg by mouth daily before  breakfast.    [provider]  mometasone (ELOCON) 0.1 % cream One application daily as needed for rash 11/16/17   Carmon Ginsberg, PA  nitroGLYCERIN (NITROSTAT) 0.4 MG SL tablet Place 1 tablet (0.4 mg total) under the tongue every 5 (five) minutes as needed for chest pain. 05/10/20   Birdie Sons, MD  omeprazole (PRILOSEC) 40 MG capsule Take 1 capsule (40 mg total) by mouth daily. 11/27/20 01/26/21  Lavonia Drafts, MD  oxyCODONE (ROXICODONE) 15 MG immediate release tablet Take 1 tablet (15 mg total) by mouth every 4 (four) hours as needed for pain. 01/06/21   Birdie Sons, MD  pioglitazone (ACTOS) 30 MG tablet Take 1 tablet (30 mg total) by mouth daily. 04/23/20   Birdie Sons, MD  promethazine (PHENERGAN) 12.5 MG tablet Take 1 tablet (12.5 mg total) by mouth every 6 (six) hours  as needed. 02/09/20   Merlyn Lot, MD  RABEprazole (ACIPHEX) 20 MG tablet TAKE 1 TABLET BY MOUTH ONCE DAILY 09/07/20   Birdie Sons, MD  rosuvastatin (CRESTOR) 40 MG tablet TAKE 1 TABLET BY MOUTH ONCE DAILY 12/05/20   Birdie Sons, MD  Semaglutide,0.25 or 0.5MG/DOS, (OZEMPIC, 0.25 OR 0.5 MG/DOSE,) 2 MG/1.5ML SOPN Inject 0.25 mg as directed once a week. 05/10/20   Birdie Sons, MD  traZODone (DESYREL) 100 MG tablet TAKE 2 TABLETS BY MOUTH AT BEDTIME 12/27/20   Birdie Sons, MD    Allergies Montelukast, Depakote [divalproex sodium], Invokana [canagliflozin], Metformin and related, Atorvastatin, and Singulair [montelukast sodium]    Social History Social History   Tobacco Use   Smoking status: Former    Packs/day: 1.50    Years: 30.00    Pack years: 45.00    Types: Cigarettes    Quit date: 08/08/2013    Years since quitting: 7.4   Smokeless tobacco: Never  Vaping Use   Vaping Use: Never used  Substance Use Topics   Alcohol use: No   Drug use: Never    Review of Systems Patient denies headaches, rhinorrhea, blurry vision, numbness, shortness of breath, chest pain, edema, cough,  abdominal pain, nausea, vomiting, diarrhea, dysuria, fevers, rashes or hallucinations unless otherwise stated above in HPI. ____________________________________________   PHYSICAL EXAM:  VITAL SIGNS: Vitals:   01/24/21 0748 01/24/21 1100  BP: (!) 150/81 (!) 175/81  Pulse: 75 62  Resp: 20 (!) 26  Temp: 98.3 F (36.8 C)   SpO2: 100% 100%    Constitutional: Alert and oriented.  Eyes: Conjunctivae are normal.  Head: Atraumatic. Nose: No congestion/rhinnorhea. Mouth/Throat: Mucous membranes are moist.   Neck: No stridor. Painless ROM.  Cardiovascular: Normal rate, regular rhythm. Grossly normal heart sounds.  Good peripheral circulation. Respiratory: Normal respiratory effort.  No retractions. Lungs CTAB. Gastrointestinal: Soft with mild ttp. No distention. No abdominal bruits. No CVA tenderness. Genitourinary:  Musculoskeletal: No lower extremity tenderness nor edema.  No joint effusions. Neurologic:  Normal speech and language. No gross focal neurologic deficits are appreciated. No facial droop Skin:  Skin is warm, dry and intact. No rash noted. Psychiatric: Mood and affect are normal. Speech and behavior are normal.  ____________________________________________   LABS (all labs ordered are listed, but only abnormal results are displayed)  Results for orders placed or performed during the hospital encounter of 01/24/21 (from the past 24 hour(s))  Lipase, blood     Status: None   Collection Time: 01/24/21  7:48 AM  Result Value Ref Range   Lipase 47 11 - 51 U/L  Comprehensive metabolic panel     Status: Abnormal   Collection Time: 01/24/21  7:48 AM  Result Value Ref Range   Sodium 135 135 - 145 mmol/L   Potassium 4.2 3.5 - 5.1 mmol/L   Chloride 96 (L) 98 - 111 mmol/L   CO2 19 (L) 22 - 32 mmol/L   Glucose, Bld 428 (H) 70 - 99 mg/dL   BUN 11 6 - 20 mg/dL   Creatinine, Ser 1.06 0.61 - 1.24 mg/dL   Calcium 10.1 8.9 - 10.3 mg/dL   Total Protein 8.4 (H) 6.5 - 8.1 g/dL    Albumin 5.2 (H) 3.5 - 5.0 g/dL   AST 23 15 - 41 U/L   ALT 24 0 - 44 U/L   Alkaline Phosphatase 72 38 - 126 U/L   Total Bilirubin 1.1 0.3 - 1.2 mg/dL   GFR, Estimated >  60 >60 mL/min   Anion gap 20 (H) 5 - 15  CBC     Status: Abnormal   Collection Time: 01/24/21  7:48 AM  Result Value Ref Range   WBC 14.9 (H) 4.0 - 10.5 K/uL   RBC 6.26 (H) 4.22 - 5.81 MIL/uL   Hemoglobin 16.7 13.0 - 17.0 g/dL   HCT 49.2 39.0 - 52.0 %   MCV 78.6 (L) 80.0 - 100.0 fL   MCH 26.7 26.0 - 34.0 pg   MCHC 33.9 30.0 - 36.0 g/dL   RDW 12.3 11.5 - 15.5 %   Platelets 278 150 - 400 K/uL   nRBC 0.0 0.0 - 0.2 %  Urinalysis, Complete w Microscopic     Status: Abnormal   Collection Time: 01/24/21  7:48 AM  Result Value Ref Range   Color, Urine YELLOW (A) YELLOW   APPearance CLEAR (A) CLEAR   Specific Gravity, Urine 1.030 1.005 - 1.030   pH 7.0 5.0 - 8.0   Glucose, UA >=500 (A) NEGATIVE mg/dL   Hgb urine dipstick MODERATE (A) NEGATIVE   Bilirubin Urine NEGATIVE NEGATIVE   Ketones, ur 80 (A) NEGATIVE mg/dL   Protein, ur 30 (A) NEGATIVE mg/dL   Nitrite NEGATIVE NEGATIVE   Leukocytes,Ua NEGATIVE NEGATIVE   RBC / HPF 0-5 0 - 5 RBC/hpf   WBC, UA 0-5 0 - 5 WBC/hpf   Bacteria, UA NONE SEEN NONE SEEN   Squamous Epithelial / LPF NONE SEEN 0 - 5  Beta-hydroxybutyric acid     Status: Abnormal   Collection Time: 01/24/21 10:21 AM  Result Value Ref Range   Beta-Hydroxybutyric Acid 1.14 (H) 0.05 - 0.27 mmol/L  CBG monitoring, ED     Status: Abnormal   Collection Time: 01/24/21 10:58 AM  Result Value Ref Range   Glucose-Capillary 424 (H) 70 - 99 mg/dL   ____________________________________________ ____________________________________________  RADIOLOGY  I personally reviewed all radiographic images ordered to evaluate for the above acute complaints and reviewed radiology reports and findings.  These findings were personally discussed with the patient.  Please see medical record for radiology  report. ____________________________________________   PROCEDURES  Procedure(s) performed:  .Critical Care Performed by: Merlyn Lot, MD Authorized by: Merlyn Lot, MD   Critical care provider statement:    Critical care time (minutes):  35   Critical care was necessary to treat or prevent imminent or life-threatening deterioration of the following conditions:  Endocrine crisis   Critical care was time spent personally by me on the following activities:  Ordering and performing treatments and interventions, ordering and review of laboratory studies, ordering and review of radiographic studies, pulse oximetry, re-evaluation of patient's condition, review of old charts, obtaining history from patient or surrogate, examination of patient, evaluation of patient's response to treatment and discussions with consultants    Critical Care performed: no ____________________________________________   INITIAL IMPRESSION / ASSESSMENT AND PLAN / ED COURSE  Pertinent labs & imaging results that were available during my care of the patient were reviewed by me and considered in my medical decision making (see chart for details).   DDX: Enteritis, colitis, perforation, diverticulitis, biliary pathology, pancreatitis, withdrawal, dka, hhs  ARNALDO HEFFRON is a 55 y.o. who presents to the ED with presentation as described above.  Patient nontoxic-appearing mildly hypertensive complaining of moderate to severe generalized abdominal pain with rising white count.  CT imaging will be ordered for above differential.  Will give pain medication as this may be related to withdrawal.  Will  observe.  Clinical Course as of 01/24/21 1118  Mon Jan 24, 2021  0955 Patient's blood work is concerning for DKA.  Have ordered IV fluids as well as insulin. [PR]  1110 Patient receiving fluids as well as IV insulin infusion.  Will discuss with hospitalist for admission. [PR]    Clinical Course User Index [PR]  Merlyn Lot, MD    The patient was evaluated in Emergency Department today for the symptoms described in the history of present illness. He/she was evaluated in the context of the global COVID-19 pandemic, which necessitated consideration that the patient might be at risk for infection with the SARS-CoV-2 virus that causes COVID-19. Institutional protocols and algorithms that pertain to the evaluation of patients at risk for COVID-19 are in a state of rapid change based on information released by regulatory bodies including the CDC and federal and state organizations. These policies and algorithms were followed during the patient's care in the ED.  As part of my medical decision making, I reviewed the following data within the [facility] notes reviewed and incorporated, Labs reviewed, notes from prior ED visits and [place] Controlled Substance Database   ____________________________________________   FINAL CLINICAL IMPRESSION(S) / ED DIAGNOSES  Final diagnoses:  Diabetic ketoacidosis without coma associated with other specified diabetes mellitus ([place])      NEW MEDICATIONS STARTED DURING THIS VISIT:  New Prescriptions   No medications on file     Note:  This document was prepared using Dragon voice recognition software and may include unintentional dictation errors.    Merlyn Lot, MD 01/24/21 1118

## 2021-01-25 ENCOUNTER — Ambulatory Visit: Payer: BC Managed Care – PPO | Admitting: Gastroenterology

## 2021-01-25 ENCOUNTER — Encounter: Payer: Self-pay | Admitting: Internal Medicine

## 2021-01-25 DIAGNOSIS — E1310 Other specified diabetes mellitus with ketoacidosis without coma: Secondary | ICD-10-CM | POA: Diagnosis not present

## 2021-01-25 DIAGNOSIS — Z7982 Long term (current) use of aspirin: Secondary | ICD-10-CM | POA: Diagnosis not present

## 2021-01-25 DIAGNOSIS — R1084 Generalized abdominal pain: Secondary | ICD-10-CM | POA: Diagnosis not present

## 2021-01-25 DIAGNOSIS — Z79899 Other long term (current) drug therapy: Secondary | ICD-10-CM | POA: Diagnosis not present

## 2021-01-25 DIAGNOSIS — Z7984 Long term (current) use of oral hypoglycemic drugs: Secondary | ICD-10-CM | POA: Diagnosis not present

## 2021-01-25 DIAGNOSIS — I2510 Atherosclerotic heart disease of native coronary artery without angina pectoris: Secondary | ICD-10-CM | POA: Diagnosis not present

## 2021-01-25 DIAGNOSIS — Z87891 Personal history of nicotine dependence: Secondary | ICD-10-CM | POA: Diagnosis not present

## 2021-01-25 DIAGNOSIS — I10 Essential (primary) hypertension: Secondary | ICD-10-CM | POA: Diagnosis not present

## 2021-01-25 DIAGNOSIS — Z20822 Contact with and (suspected) exposure to covid-19: Secondary | ICD-10-CM | POA: Diagnosis not present

## 2021-01-25 DIAGNOSIS — J45909 Unspecified asthma, uncomplicated: Secondary | ICD-10-CM | POA: Diagnosis not present

## 2021-01-25 DIAGNOSIS — E1110 Type 2 diabetes mellitus with ketoacidosis without coma: Secondary | ICD-10-CM | POA: Diagnosis not present

## 2021-01-25 DIAGNOSIS — Z794 Long term (current) use of insulin: Secondary | ICD-10-CM | POA: Diagnosis not present

## 2021-01-25 LAB — GLUCOSE, CAPILLARY
Glucose-Capillary: 115 mg/dL — ABNORMAL HIGH (ref 70–99)
Glucose-Capillary: 142 mg/dL — ABNORMAL HIGH (ref 70–99)
Glucose-Capillary: 160 mg/dL — ABNORMAL HIGH (ref 70–99)
Glucose-Capillary: 161 mg/dL — ABNORMAL HIGH (ref 70–99)
Glucose-Capillary: 161 mg/dL — ABNORMAL HIGH (ref 70–99)
Glucose-Capillary: 173 mg/dL — ABNORMAL HIGH (ref 70–99)
Glucose-Capillary: 180 mg/dL — ABNORMAL HIGH (ref 70–99)
Glucose-Capillary: 182 mg/dL — ABNORMAL HIGH (ref 70–99)
Glucose-Capillary: 192 mg/dL — ABNORMAL HIGH (ref 70–99)
Glucose-Capillary: 204 mg/dL — ABNORMAL HIGH (ref 70–99)
Glucose-Capillary: 208 mg/dL — ABNORMAL HIGH (ref 70–99)
Glucose-Capillary: 323 mg/dL — ABNORMAL HIGH (ref 70–99)

## 2021-01-25 LAB — BASIC METABOLIC PANEL
Anion gap: 9 (ref 5–15)
Anion gap: 8 (ref 5–15)
BUN: 8 mg/dL (ref 6–20)
BUN: 9 mg/dL (ref 6–20)
CO2: 24 mmol/L (ref 22–32)
CO2: 26 mmol/L (ref 22–32)
Calcium: 9 mg/dL (ref 8.9–10.3)
Calcium: 9 mg/dL (ref 8.9–10.3)
Chloride: 103 mmol/L (ref 98–111)
Chloride: 103 mmol/L (ref 98–111)
Creatinine, Ser: 0.68 mg/dL (ref 0.61–1.24)
Creatinine, Ser: 0.68 mg/dL (ref 0.61–1.24)
GFR, Estimated: >60 mL/min (ref >60)
GFR, Estimated: >60 mL/min (ref >60)
Glucose, Bld: 169 mg/dL — ABNORMAL HIGH (ref 70–99)
Glucose, Bld: 178 mg/dL — ABNORMAL HIGH (ref 70–99)
Potassium: 3.5 mmol/L (ref 3.5–5.1)
Potassium: 3.5 mmol/L (ref 3.5–5.1)
Sodium: 136 mmol/L (ref 135–145)
Sodium: 137 mmol/L (ref 135–145)

## 2021-01-25 LAB — BETA-HYDROXYBUTYRIC ACID
Beta-Hydroxybutyric Acid: 0.27 mmol/L (ref 0.05–0.27)
Beta-Hydroxybutyric Acid: 0.09 mmol/L (ref 0.05–0.27)

## 2021-01-25 LAB — HEMOGLOBIN A1C
Hgb A1c MFr Bld: 11.6 % — ABNORMAL HIGH (ref 4.8–5.6)
Mean Plasma Glucose: 286 mg/dL

## 2021-01-25 MED ORDER — LIDOCAINE VISCOUS HCL 2 % MT SOLN
15.0000 mL | Freq: Once | OROMUCOSAL | Status: AC
  Administered 2021-01-26: 15 mL via ORAL
  Filled 2021-01-25 (×2): qty 15

## 2021-01-25 MED ORDER — ALUM & MAG HYDROXIDE-SIMETH 200-200-20 MG/5ML PO SUSP
30.0000 mL | Freq: Four times a day (QID) | ORAL | Status: DC | PRN
  Administered 2021-01-25: 30 mL via ORAL
  Filled 2021-01-25: qty 30

## 2021-01-25 MED ORDER — OXYCODONE HCL 5 MG PO TABS
15.0000 mg | ORAL_TABLET | ORAL | Status: DC | PRN
  Administered 2021-01-26 (×2): 15 mg via ORAL
  Filled 2021-01-25 (×3): qty 3

## 2021-01-25 MED ORDER — INSULIN GLARGINE-YFGN 100 UNIT/ML ~~LOC~~ SOLN
20.0000 [IU] | Freq: Every day | SUBCUTANEOUS | Status: DC
  Administered 2021-01-25: 20 [IU] via SUBCUTANEOUS
  Filled 2021-01-25 (×2): qty 0.2

## 2021-01-25 MED ORDER — INSULIN ASPART 100 UNIT/ML IJ SOLN
0.0000 [IU] | Freq: Every day | INTRAMUSCULAR | Status: DC
  Administered 2021-01-25: 2 [IU] via SUBCUTANEOUS
  Filled 2021-01-25: qty 1

## 2021-01-25 MED ORDER — CALCIUM CARBONATE ANTACID 500 MG PO CHEW
1.0000 | CHEWABLE_TABLET | Freq: Three times a day (TID) | ORAL | Status: DC | PRN
  Administered 2021-01-25: 200 mg via ORAL
  Filled 2021-01-25: qty 1

## 2021-01-25 MED ORDER — ALUM & MAG HYDROXIDE-SIMETH 200-200-20 MG/5ML PO SUSP
30.0000 mL | Freq: Once | ORAL | Status: AC
  Administered 2021-01-26: 30 mL via ORAL
  Filled 2021-01-25 (×2): qty 30

## 2021-01-25 MED ORDER — INSULIN ASPART 100 UNIT/ML IJ SOLN
0.0000 [IU] | Freq: Three times a day (TID) | INTRAMUSCULAR | Status: DC
  Administered 2021-01-25 – 2021-01-26 (×2): 11 [IU] via SUBCUTANEOUS
  Administered 2021-01-26: 5 [IU] via SUBCUTANEOUS
  Filled 2021-01-25 (×3): qty 1

## 2021-01-25 MED ORDER — HYDRALAZINE HCL 50 MG PO TABS: 25.0000 mg | ORAL_TABLET | Freq: Four times a day (QID) | ORAL | Status: DC | PRN

## 2021-01-25 MED ORDER — LISINOPRIL 20 MG PO TABS
20.0000 mg | ORAL_TABLET | Freq: Every day | ORAL | Status: DC
  Administered 2021-01-25: 20 mg via ORAL
  Filled 2021-01-25 (×3): qty 1

## 2021-01-25 NOTE — Progress Notes (Signed)
Inpatient Diabetes Program Recommendations  AACE/ADA: New Consensus Statement on Inpatient Glycemic Control (2015)  Target Ranges:  Prepandial:   less than 140 mg/dL      Peak postprandial:   less than 180 mg/dL (1-2 hours)      Critically ill patients:  140 - 180 mg/dL   Lab Results  Component Value Date   GLUCAP 180 (H) 01/25/2021   HGBA1C 11.6 (H) 01/24/2021    Review of Glycemic Control Results for Vincent Hall, Vincent Hall" (MRN [id number]) as of 01/25/2021 11:41  Ref. Range 01/25/2021 02:49 01/25/2021 04:00 01/25/2021 05:20 01/25/2021 06:24 01/25/2021 07:38 01/25/2021 09:46 01/25/2021 10:58  Glucose-Capillary Latest Ref Range: 70 - 99 mg/dL 192 (H)  6 units/hour 182 (H)  5 units/hour 173 (H) 161 (H) 160 (H)  4.6 units/hour 115 (H)  1.1 units/hour 180 (H)      Semglee 20 units given at 1032   Diabetes history: DM 2 Outpatient Diabetes medications: Ozempic 0.25 mg weekly (been taking for 5-6 months), Glipizide 5 mg bid (pt reports taking in the morning and taking Actos in the evening), Actos 30 mg Daily, Humalog 6-10 units tid Current orders for Inpatient glycemic control:  IV insulin transitioning to  Semglee 20 units Daily   Inpatient Diabetes Program Recommendations:    -   Add Novolog 0-15 units tid + hs  Anticipating pt will require more basal insulin based on IV insulin gtt rates. May need closer to at least 30 units of Semglee. May need additional basal insulin injection this evening.  Spoke with pt regarding a1c level of 11.6% this admission and glucose control at home. A1c was 7.5% on 8/1 this year at PCP appt. Pt reports not doing what he should and not watching what he ate. Pt would take Novolog only after having his glucose trend up to bring it down. Pt checks his glucose at home and has noticed an increase in the levels. Pt states he will take anything as long as he does not come back to the hospital. Pt ok with starting basal insulin at time of d/c if  needed. Pt reports taking insulin via insulin pen. Pt also reported receiving Humalog samples from his doctors office.  D/C: -  Levemir looks to be preferred by pts insurance -  Insulin pen needles order # [id number]  Thanks,  Tama Headings RN, MSN, BC-ADM Inpatient Diabetes Coordinator Team Pager [phone] (8a-5p)

## 2021-01-25 NOTE — Progress Notes (Signed)
Initial Nutrition Assessment  DOCUMENTATION CODES:   Obesity unspecified  INTERVENTION:   RD will add supplements once pt's diet is advanced   Pt at high refeed risk; recommend monitor potassium, magnesium and phosphorus labs daily until stable  NUTRITION DIAGNOSIS:   Inadequate oral intake related to acute illness as evidenced by per patient/family report.  GOAL:   Patient will meet greater than or equal to 90% of their needs  MONITOR:   Diet advancement, Labs, Weight trends, Skin, I & O's  REASON FOR ASSESSMENT:   Malnutrition Screening Tool    ASSESSMENT:   55 y/o male with h/o DM, chronic pain syndrome with opioid dependence, CAD s/p CABG, bipolar 2, STEMI, fatty liver, suicide attempt, anxiety and depression who is admitted with DKA.  Met with pt and pt's wife in room today. Pt reports that he is feeling a little better. Wife at bedside provides most of the history as pt dozing on and off to sleep. Wife reports that pt has not eaten anything since some chicken noodle soup on Saturday r/t nausea and vomiting. Wife reports that at baseline, pt has good appetite but mainly eats a lot of sweets and fruit. Per chart, pt is down 19lbs(9%) over the past two months; this is significant. Pt is aware that he has lost weight. RD will add supplements once pt's diet is advanced (prefers strawberry). RD will follow up to provide diabetes diet education prior to discharge.   Medications reviewed and include: aspirin, celexa, lovenox, insulin, protonix  Labs reviewed: K 3.5 wnl Wbc- 14.9(H) Cbgs- 173, 161, 160, 115, 180 x 24 hrs AIC 11.6(H)- 10/17  NUTRITION - FOCUSED PHYSICAL EXAM:  Flowsheet Row Most Recent Value  Orbital Region No depletion  Upper Arm Region No depletion  Thoracic and Lumbar Region No depletion  Buccal Region No depletion  Temple Region No depletion  Clavicle Bone Region No depletion  Clavicle and Acromion Bone Region No depletion  Scapular Bone Region No  depletion  Dorsal Hand No depletion  Patellar Region No depletion  Anterior Thigh Region No depletion  Posterior Calf Region No depletion  Edema (RD Assessment) None  Hair Reviewed  Eyes Reviewed  Mouth Reviewed  Skin Reviewed  Nails Reviewed   Diet Order:   Diet Order             Diet NPO time specified  Diet effective now                  EDUCATION NEEDS:   Not appropriate for education at this time  Skin:  Skin Assessment: Reviewed RN Assessment  Last BM:  10/16  Height:   Ht Readings from Last 1 Encounters:  01/24/21 '5\' 8"'  (1.727 m)    Weight:   Wt Readings from Last 1 Encounters:  01/24/21 93.3 kg    Ideal Body Weight:  70 kg  BMI:  Body mass index is 31.27 kg/m.  Estimated Nutritional Needs:   Kcal:  2000-2300kcal/day  Protein:  100-115g/day  Fluid:  2.1-2.4L/day  Koleen Distance MS, RD, LDN Please refer to [facility] for RD and/or RD on-call/weekend/after hours pager

## 2021-01-25 NOTE — Progress Notes (Signed)
Progress Note    [name]  IWL:[id number] DOB: 01-20-1966  DOA: 01/24/2021 PCP: Birdie Sons, MD      Brief Narrative:    Medical records reviewed and are as summarized below:  Vincent Hall is a 55 y.o. male with medical history significant for obesity, type II DM, hypertension, fatty liver, CAD (history of STEMI), anxiety, asthma, bipolar disorder, who presented to the hospital because of nausea, vomiting and abdominal pain.  He was found to have DKA.  He was admitted to the ICU.  He was treated with IV insulin infusion and IV fluids.      Assessment/Plan:   Principal Problem:   DKA (diabetic ketoacidosis) ([place]) Active Problems:   Coronary artery disease   Anxiety   Depression   Chronic bilateral low back pain with sciatica   Nutrition Problem: Inadequate oral intake Etiology: acute illness  Signs/Symptoms: per patient/family report   Body mass index is 31.27 kg/m.  (Obesity)  DKA/type II DM with hyperglycemia: Hemoglobin A1c is 11.6.  Taper off insulin infusion and start insulin glargine 20 units daily.  Use NovoLog as needed for hyperglycemia.  He is willing to use long-acting insulin at discharge to control his sugars at home.  Hypertensive urgency, hypertension: Start lisinopril for BP control.  Risk and benefits were discussed and he is agreeable.  He understands that he needs to monitor his BMP with his PCP in the outpatient setting.  Medical nonadherence: The importance of medical adherence was emphasized.  Other comorbidities include CAD, anxiety, depression, bipolar disorder  Diet Order             Diet heart healthy/carb modified Room service appropriate? Yes; Fluid consistency: Thin  Diet effective now                      Consultants: None  Procedures: None    Medications:    aspirin EC  81 mg Oral Daily   Chlorhexidine Gluconate Cloth  6 each Topical Q0600   citalopram  40 mg Oral Daily   enoxaparin  (LOVENOX) injection  0.5 mg/kg Subcutaneous Q24H   influenza vac split quadrivalent PF  0.5 mL Intramuscular Tomorrow-1000   insulin glargine-yfgn  20 Units Subcutaneous Daily   lisinopril  20 mg Oral Daily   pantoprazole (PROTONIX) IV  40 mg Intravenous Q24H   rosuvastatin  40 mg Oral Daily   traZODone  200 mg Oral QHS   Continuous Infusions:  dextrose 5% lactated ringers 125 mL/hr at 01/24/21 1410   insulin 1.1 Units/hr (01/25/21 0947)   lactated ringers     lactated ringers Stopped (01/24/21 1318)     Anti-infectives (From admission, onward)    None              Family Communication/Anticipated D/C date and plan/Code Status   DVT prophylaxis:      Code Status: Full Code  Family Communication: Plan discussed with his wife at the bedside Disposition Plan:    Status is: Inpatient  Remains inpatient appropriate because: Hyperglycemia/DKA           Subjective:   Interval events noted.  Abdominal pain is better.  No nausea or vomiting  Objective:    Vitals:   01/25/21 0400 01/25/21 0600 01/25/21 0700 01/25/21 0800  BP: (!) 160/70 (!) 188/62  (!) 185/81  Pulse: 62 65 (!) 58 (!) 58  Resp: 18 (!) 22 18 18   Temp: 99.7 F (37.6 C)  TempSrc: Oral     SpO2: 99% 97% 98% 99%  Weight:      Height:       No data found.   Intake/Output Summary (Last 24 hours) at 01/25/2021 1233 Last data filed at 01/25/2021 0250 Gross per 24 hour  Intake 1900 ml  Output 700 ml  Net 1200 ml   Filed Weights   01/24/21 0748 01/24/21 2026  Weight: 94.3 kg 93.3 kg    Exam:  GEN: NAD SKIN: No rash EYES: EOMI ENT: MMM CV: RRR PULM: CTA B ABD: soft, obese, NT, +BS CNS: AAO x 3, non focal EXT: No edema or tenderness        Data Reviewed:   I have personally reviewed following labs and imaging studies:  Labs: Labs show the following:   Basic Metabolic Panel: Recent Labs  Lab 01/24/21 1413 01/24/21 1805 01/24/21 2106 01/25/21 0056  01/25/21 0451  NA 136 137 135 136 137  K 3.7 3.5 3.6 3.5 3.5  CL 102 104 102 103 103  CO2 24 26 23 24 26   GLUCOSE 250* 200* 191* 169* 178*  BUN 11 11 10 8 9   CREATININE 0.85 0.93 0.80 0.68 0.68  CALCIUM 9.0 9.1 9.2 9.0 9.0   GFR Estimated Creatinine Clearance: 114.3 mL/min (by C-G formula based on SCr of 0.68 mg/dL). Liver Function Tests: Recent Labs  Lab 01/24/21 0748  AST 23  ALT 24  ALKPHOS 72  BILITOT 1.1  PROT 8.4*  ALBUMIN 5.2*   Recent Labs  Lab 01/24/21 0748  LIPASE 47   No results for input(s): AMMONIA in the last 168 hours. Coagulation profile No results for input(s): INR, PROTIME in the last 168 hours.  CBC: Recent Labs  Lab 01/24/21 0748  WBC 14.9*  HGB 16.7  HCT 49.2  MCV 78.6*  PLT 278   Cardiac Enzymes: No results for input(s): CKTOTAL, CKMB, CKMBINDEX, TROPONINI in the last 168 hours. BNP (last 3 results) No results for input(s): PROBNP in the last 8760 hours. CBG: Recent Labs  Lab 01/25/21 0624 01/25/21 0738 01/25/21 0946 01/25/21 1058 01/25/21 1204  GLUCAP 161* 160* 115* 180* 161*   D-Dimer: No results for input(s): DDIMER in the last 72 hours. Hgb A1c: Recent Labs    01/24/21 1021 01/24/21 1413  HGBA1C 11.2* 11.6*   Lipid Profile: No results for input(s): CHOL, HDL, LDLCALC, TRIG, CHOLHDL, LDLDIRECT in the last 72 hours. Thyroid function studies: No results for input(s): TSH, T4TOTAL, T3FREE, THYROIDAB in the last 72 hours.  Invalid input(s): FREET3 Anemia work up: No results for input(s): VITAMINB12, FOLATE, FERRITIN, TIBC, IRON, RETICCTPCT in the last 72 hours. Sepsis Labs: Recent Labs  Lab 01/24/21 0748  WBC 14.9*    Microbiology Recent Results (from the past 240 hour(s))  Resp Panel by RT-PCR (Flu A&B, Covid) Nasopharyngeal Swab     Status: None   Collection Time: 01/24/21 11:38 AM   Specimen: Nasopharyngeal Swab; Nasopharyngeal(NP) swabs in vial transport medium  Result Value Ref Range Status   SARS  Coronavirus 2 by RT PCR NEGATIVE NEGATIVE Final    Comment: (NOTE) SARS-CoV-2 target nucleic acids are NOT DETECTED.  The SARS-CoV-2 RNA is generally detectable in upper respiratory specimens during the acute phase of infection. The lowest concentration of SARS-CoV-2 viral copies this assay can detect is 138 copies/mL. A negative result does not preclude SARS-Cov-2 infection and should not be used as the sole basis for treatment or other patient management decisions. A negative result may occur  with  improper specimen collection/handling, submission of specimen other than nasopharyngeal swab, presence of viral mutation(s) within the areas targeted by this assay, and inadequate number of viral copies(<138 copies/mL). A negative result must be combined with clinical observations, patient history, and epidemiological information. The expected result is Negative.  Fact Sheet for Patients:  EntrepreneurPulse.com.au  Fact Sheet for Healthcare Providers:  IncredibleEmployment.be  This test is no t yet approved or cleared by the [place] FDA and  has been authorized for detection and/or diagnosis of SARS-CoV-2 by FDA under an Emergency Use Authorization (EUA). This EUA will remain  in effect (meaning this test can be used) for the duration of the COVID-19 declaration under Section 564(b)(1) of the Act, 21 U.S.C.section 360bbb-3(b)(1), unless the authorization is terminated  or revoked sooner.       Influenza A by PCR NEGATIVE NEGATIVE Final   Influenza B by PCR NEGATIVE NEGATIVE Final    Comment: (NOTE) The Xpert Xpress SARS-CoV-2/FLU/RSV plus assay is intended as an aid in the diagnosis of influenza from Nasopharyngeal swab specimens and should not be used as a sole basis for treatment. Nasal washings and aspirates are unacceptable for Xpert Xpress SARS-CoV-2/FLU/RSV testing.  Fact Sheet for  Patients: EntrepreneurPulse.com.au  Fact Sheet for Healthcare Providers: IncredibleEmployment.be  This test is not yet approved or cleared by the [place] FDA and has been authorized for detection and/or diagnosis of SARS-CoV-2 by FDA under an Emergency Use Authorization (EUA). This EUA will remain in effect (meaning this test can be used) for the duration of the COVID-19 declaration under Section 564(b)(1) of the Act, 21 U.S.C. section 360bbb-3(b)(1), unless the authorization is terminated or revoked.  Performed at [facility], [address] [place], [place] [address]   MRSA Next Gen by PCR, Nasal     Status: None   Collection Time: 01/24/21  8:33 PM   Specimen: Nasal Mucosa; Nasal Swab  Result Value Ref Range Status   MRSA by PCR Next Gen NOT DETECTED NOT DETECTED Final    Comment: (NOTE) The GeneXpert MRSA Assay (FDA approved for NASAL specimens only), is one component of a comprehensive MRSA colonization surveillance program. It is not intended to diagnose MRSA infection nor to guide or monitor treatment for MRSA infections. Test performance is not FDA approved in patients less than 2 years old. Performed at [facility], [address] [place], [place] [address]     Procedures and diagnostic studies:  CT ABDOMEN PELVIS W CONTRAST  Result Date: 01/24/2021 CLINICAL DATA:  Abdominal pain, possible abscess, possible obstruction. Withdrawal symptoms. EXAM: CT ABDOMEN AND PELVIS WITH CONTRAST TECHNIQUE: Multidetector CT imaging of the abdomen and pelvis was performed using the standard protocol following bolus administration of intravenous contrast. CONTRAST:  140mL OMNIPAQUE IOHEXOL 300 MG/ML  SOLN COMPARISON:  11/27/2020 FINDINGS: Lower chest: Coronary calcifications. Previous CABG. No pleural or pericardial effusion. Visualized lung bases clear. Hepatobiliary: No focal liver abnormality is seen. No gallstones,  gallbladder wall thickening, or biliary dilatation. Pancreas: Unremarkable. No pancreatic ductal dilatation or surrounding inflammatory changes. Spleen: Normal in size without focal abnormality. Adrenals/Urinary Tract: Adrenal glands are unremarkable. Kidneys are normal, without renal calculi, focal lesion, or hydronephrosis. Bladder is unremarkable. Stomach/Bowel: Partial distention of the stomach by fluid. The small bowel is nondilated. Post appendectomy. The colon is nondilated, unremarkable. Vascular/Lymphatic: Moderate aortoiliac calcified plaque without aneurysm. Portal vein patent. No abdominal or pelvic adenopathy. Reproductive: Prostate is unremarkable. Other: No ascites.  No free air. Musculoskeletal: Sternotomy wires.  No fracture or worrisome bone lesion. IMPRESSION: 1. No acute findings. 2.  Aortic Atherosclerosis (ICD10-170.0). Electronically Signed   By: Lucrezia Europe M.D.   On: 01/24/2021 11:23               LOS: 1 day   Khoi Hamberger  [facility]   Pager on [email or web address]. If 7PM-7AM, please contact night-coverage at [email or web address]     01/25/2021, 12:33 PM

## 2021-01-26 DIAGNOSIS — E1110 Type 2 diabetes mellitus with ketoacidosis without coma: Secondary | ICD-10-CM | POA: Diagnosis not present

## 2021-01-26 LAB — GLUCOSE, CAPILLARY
Glucose-Capillary: 328 mg/dL — ABNORMAL HIGH (ref 70–99)
Glucose-Capillary: 229 mg/dL — ABNORMAL HIGH (ref 70–99)

## 2021-01-26 MED ORDER — LISINOPRIL 20 MG PO TABS: 20.0000 mg | ORAL_TABLET | Freq: Every day | ORAL | 0 refills | Status: DC

## 2021-01-26 MED ORDER — METHOCARBAMOL 750 MG PO TABS: 750.0000 mg | ORAL_TABLET | Freq: Four times a day (QID) | ORAL | 0 refills | Status: DC | PRN

## 2021-01-26 MED ORDER — INSULIN PEN NEEDLE 32G X 4 MM MISC: 1.0000 | Freq: Three times a day (TID) | 0 refills | Status: AC

## 2021-01-26 MED ORDER — DICYCLOMINE HCL 20 MG PO TABS
20.0000 mg | ORAL_TABLET | Freq: Three times a day (TID) | ORAL | Status: DC
  Administered 2021-01-26: 20 mg via ORAL
  Filled 2021-01-26 (×3): qty 1

## 2021-01-26 MED ORDER — METHOCARBAMOL 500 MG PO TABS
750.0000 mg | ORAL_TABLET | Freq: Four times a day (QID) | ORAL | Status: DC | PRN
  Administered 2021-01-26: 750 mg via ORAL
  Filled 2021-01-26: qty 2

## 2021-01-26 MED ORDER — ENSURE MAX PROTEIN PO LIQD
11.0000 [oz_av] | Freq: Two times a day (BID) | ORAL | Status: DC
  Administered 2021-01-26: 11 [oz_av] via ORAL
  Filled 2021-01-26: qty 330

## 2021-01-26 MED ORDER — METOCLOPRAMIDE HCL 10 MG PO TABS: 10.0000 mg | ORAL_TABLET | Freq: Three times a day (TID) | ORAL | 0 refills | Status: DC

## 2021-01-26 MED ORDER — SODIUM CHLORIDE 0.9 % IV SOLN
12.5000 mg | Freq: Four times a day (QID) | INTRAVENOUS | Status: DC | PRN
  Filled 2021-01-26: qty 0.5

## 2021-01-26 MED ORDER — LEVEMIR FLEXTOUCH 100 UNIT/ML ~~LOC~~ SOPN: 30.0000 [IU] | PEN_INJECTOR | Freq: Every day | SUBCUTANEOUS | 0 refills | Status: DC

## 2021-01-26 MED ORDER — METOCLOPRAMIDE HCL 10 MG PO TABS
10.0000 mg | ORAL_TABLET | Freq: Three times a day (TID) | ORAL | Status: DC
  Administered 2021-01-26: 10 mg via ORAL
  Filled 2021-01-26: qty 1

## 2021-01-26 MED ORDER — ADULT MULTIVITAMIN W/MINERALS CH: 1.0000 | ORAL_TABLET | Freq: Every day | ORAL | Status: DC

## 2021-01-26 MED ORDER — INSULIN GLARGINE-YFGN 100 UNIT/ML ~~LOC~~ SOLN
30.0000 [IU] | Freq: Every day | SUBCUTANEOUS | Status: DC
  Administered 2021-01-26: 30 [IU] via SUBCUTANEOUS
  Filled 2021-01-26: qty 0.3

## 2021-01-26 NOTE — Discharge Summary (Signed)
Physician Discharge Summary  LASH MATULICH YPP:[id number] DOB: [birth year] DOA: 01/24/2021  PCP: Birdie Sons, MD  Admit date: 01/24/2021 Discharge date: 01/26/2021  Admitted From: Home Disposition: Home  Recommendations for Outpatient Follow-up:  Follow up with PCP in 1-2 weeks Consider referral to endocrinology  Home Health: No Equipment/Devices: None  Discharge Condition: Stable CODE STATUS: Full Diet recommendation: Carb modified  Brief/Interim Summary: 55 y.o. male with medical history significant for obesity, type II DM, hypertension, fatty liver, CAD (history of STEMI), anxiety, asthma, bipolar disorder, who presented to the hospital because of nausea, vomiting and abdominal pain.  He was found to have DKA.  He was admitted to the ICU.  He was treated with IV insulin infusion and IV fluids.  DKA resolved.  Diabetic coordinator consult appreciated.  On the day of discharge I had a lengthy discussion with the patient as well as the wife at bedside.  Explained that patient's chronic abdominal pain was likely secondary to diabetic gastroparesis in the setting of poorly controlled diabetes mellitus.  I explained that without better control of diabetes his domino pain is unlikely to resolve.  Also for recommendations and diabetes coordinator will add Lantus 30 units daily to home diabetic regimen.  Patient encouraged to seek consultation with a endocrinologist in the outpatient setting.  At time of discharge I strongly recommend discontinuation of all narcotic therapy.  Will prescribe Bentyl 4 times daily, as needed Robaxin, Levemir.  Stable for discharge.  Follow-up outpatient PCP.    Discharge Diagnoses:  Principal Problem:   DKA (diabetic ketoacidosis) ([place]) Active Problems:   Coronary artery disease   Anxiety   Depression   Chronic bilateral low back pain with sciatica  EKG Type 2 diabetes mellitus with hyperglycemia, poor control Hemoglobin A1c 11.6.  Patient  endorses a dietary and medication indiscretion.  This is likely the source of his poorly controlled diabetes.  Time of discharge patient states he is willing to use a long-acting insulin.  We will add Levemir 30 units daily to home medication regimen.  Chronic abdominal pain CT abdomen pelvis negative.  Likely secondary to diabetic gastroparesis in the setting of poorly controlled diabetes mellitus.  Explained pathophysiology to the patient.  At time of discharge will recommend Reglan 10 mg p.o. 3 times daily.  As needed Phenergan for breakthrough nausea and vomiting.  Follow-up outpatient PCP.  Consider referral to endocrinology  Hypertensive urgency Does not appear that hypertension was an established diagnosis.  Was started on lisinopril in house.  Will prescribe on discharge.  Discharge Instructions  Discharge Instructions     Diet - low sodium heart healthy   Complete by: As directed    Increase activity slowly   Complete by: As directed       Allergies as of 01/26/2021       Reactions   Montelukast Hives   Singular   Depakote [divalproex Sodium] Rash   Invokana [canagliflozin] Other (See Comments)   Balanitis   Metformin And Related    Taken off in in early 2021 due to upset stomach.    Atorvastatin Other (See Comments)   Muscle pain, neck stiffness   Singulair [montelukast Sodium] Hives        Medication List     STOP taking these medications    azelastine 0.05 % ophthalmic solution Commonly known as: OPTIVAR   azelastine 0.1 % nasal spray Commonly known as: ASTELIN   celecoxib 100 MG capsule Commonly known as: CELEBREX   cyclobenzaprine 5  MG tablet Commonly known as: FLEXERIL   dicyclomine 10 MG capsule Commonly known as: Bentyl   ketoconazole 2 % cream Commonly known as: NIZORAL   mometasone 0.1 % cream Commonly known as: ELOCON   promethazine 12.5 MG tablet Commonly known as: PHENERGAN   RABEprazole 20 MG tablet Commonly known as: ACIPHEX        TAKE these medications    albuterol 108 (90 Base) MCG/ACT inhaler Commonly known as: VENTOLIN HFA Inhale 2 puffs into the lungs every 6 (six) hours as needed for wheezing or shortness of breath.   aspirin 81 MG tablet Take 81 mg by mouth daily.   citalopram 40 MG tablet Commonly known as: CELEXA Take 1 tablet (40 mg total) by mouth daily.   glipiZIDE 5 MG 24 hr tablet Commonly known as: GLUCOTROL XL TAKE 1 TABLET BY MOUTH ONCE DAILY WITH BREAKFAST   glucose blood test strip Commonly known as: ONE TOUCH ULTRA TEST Check blood sugar 3 times daily E11.65 (uncontrolled insulin dependent diabetes)   HumaLOG KwikPen 200 UNIT/ML KwikPen Generic drug: insulin lispro Use before meals up to three times a day. Take 6 units if sugar is over 200. 8 units if over 250 and 10 units if over 300.   Insulin Pen Needle 32G X 4 MM Misc 1 each by Does not apply route 4 (four) times daily -  before meals and at bedtime.   Levemir FlexTouch 100 UNIT/ML FlexPen Generic drug: insulin detemir Inject 30 Units into the skin daily.   lisinopril 20 MG tablet Commonly known as: ZESTRIL Take 1 tablet (20 mg total) by mouth daily. Start taking on: January 27, 2021   methocarbamol 750 MG tablet Commonly known as: ROBAXIN Take 1 tablet (750 mg total) by mouth every 6 (six) hours as needed for muscle spasms (back pain).   metoCLOPramide 10 MG tablet Commonly known as: REGLAN Take 1 tablet (10 mg total) by mouth 4 (four) times daily -  before meals and at bedtime.   nitroGLYCERIN 0.4 MG SL tablet Commonly known as: NITROSTAT Place 1 tablet (0.4 mg total) under the tongue every 5 (five) minutes as needed for chest pain.   omeprazole 40 MG capsule Commonly known as: PRILOSEC Take 1 capsule (40 mg total) by mouth daily.   ONE TOUCH ULTRA 2 w/Device Kit USE AS DIRECTED   oxyCODONE 15 MG immediate release tablet Commonly known as: ROXICODONE Take 1 tablet (15 mg total) by mouth every 4 (four)  hours as needed for pain.   Ozempic (0.25 or 0.5 MG/DOSE) 2 MG/1.5ML Sopn Generic drug: Semaglutide(0.25 or 0.5MG/DOS) Inject 0.25 mg as directed once a week.   pioglitazone 30 MG tablet Commonly known as: ACTOS Take 1 tablet (30 mg total) by mouth daily.   rosuvastatin 40 MG tablet Commonly known as: CRESTOR TAKE 1 TABLET BY MOUTH ONCE DAILY   traZODone 100 MG tablet Commonly known as: DESYREL TAKE 2 TABLETS BY MOUTH AT BEDTIME        Follow-up Information     Birdie Sons, MD. Schedule an appointment as soon as possible for a visit in 1 week(s).   Specialty: Family Medicine Contact information: [address] [place] [address] [phone]         Kate Sable, MD .   Specialties: Cardiology, Radiology Contact information: [address] [place] [address] [phone]                Allergies  Allergen Reactions  Montelukast Hives    Singular   Depakote [Divalproex Sodium] Rash   Invokana [Canagliflozin] Other (See Comments)    Balanitis    Metformin And Related     Taken off in in early 2021 due to upset stomach.    Atorvastatin Other (See Comments)    Muscle pain, neck stiffness   Singulair [Montelukast Sodium] Hives    Consultations: None   Procedures/Studies: CT ABDOMEN PELVIS W CONTRAST  Result Date: 01/24/2021 CLINICAL DATA:  Abdominal pain, possible abscess, possible obstruction. Withdrawal symptoms. EXAM: CT ABDOMEN AND PELVIS WITH CONTRAST TECHNIQUE: Multidetector CT imaging of the abdomen and pelvis was performed using the standard protocol following bolus administration of intravenous contrast. CONTRAST:  123m OMNIPAQUE IOHEXOL 300 MG/ML  SOLN COMPARISON:  11/27/2020 FINDINGS: Lower chest: Coronary calcifications. Previous CABG. No pleural or pericardial effusion. Visualized lung bases clear. Hepatobiliary: No focal liver abnormality is seen. No gallstones, gallbladder wall thickening, or  biliary dilatation. Pancreas: Unremarkable. No pancreatic ductal dilatation or surrounding inflammatory changes. Spleen: Normal in size without focal abnormality. Adrenals/Urinary Tract: Adrenal glands are unremarkable. Kidneys are normal, without renal calculi, focal lesion, or hydronephrosis. Bladder is unremarkable. Stomach/Bowel: Partial distention of the stomach by fluid. The small bowel is nondilated. Post appendectomy. The colon is nondilated, unremarkable. Vascular/Lymphatic: Moderate aortoiliac calcified plaque without aneurysm. Portal vein patent. No abdominal or pelvic adenopathy. Reproductive: Prostate is unremarkable. Other: No ascites.  No free air. Musculoskeletal: Sternotomy wires. No fracture or worrisome bone lesion. IMPRESSION: 1. No acute findings. 2.  Aortic Atherosclerosis (ICD10-170.0). Electronically Signed   By: DLucrezia EuropeM.D.   On: 01/24/2021 11:23      Subjective: Seen and examined at the time of discharge.  Stable no distress.  Continues to endorse some abdominal pain but improved from prior.  Stable for discharge home.  Discharge Exam: Vitals:   01/26/21 0543 01/26/21 0751  BP: 115/68 (!) 166/74  Pulse: 65 65  Resp: 20 18  Temp: 98.1 F (36.7 C) 98.9 F (37.2 C)  SpO2: 97% 98%   Vitals:   01/25/21 1757 01/25/21 2035 01/26/21 0543 01/26/21 0751  BP: (!) 163/86 (!) 171/74 115/68 (!) 166/74  Pulse: 62 62 65 65  Resp: _0 Temp: 99.5 F (37.5 C) 99.9 F (37.7 C) 98.1 F (36.7 C) 98.9 F (37.2 C)  TempSrc: Oral Oral Oral Oral  SpO2: 98% 100% 97% 98%  Weight:      Height:        General: Pt is alert, awake, not in acute distress Cardiovascular: RRR, S1/S2 +, no rubs, no gallops Respiratory: CTA bilaterally, no wheezing, no rhonchi Abdominal: Soft, NT, ND, bowel sounds + Extremities: no edema, no cyanosis    The results of significant diagnostics from this hospitalization (including imaging, microbiology, ancillary and laboratory) are listed  below for reference.     Microbiology: Recent Results (from the past 240 hour(s))  Resp Panel by RT-PCR (Flu A&B, Covid) Nasopharyngeal Swab     Status: None   Collection Time: 01/24/21 11:38 AM   Specimen: Nasopharyngeal Swab; Nasopharyngeal(NP) swabs in vial transport medium  Result Value Ref Range Status   SARS Coronavirus 2 by RT PCR NEGATIVE NEGATIVE Final    Comment: (NOTE) SARS-CoV-2 target nucleic acids are NOT DETECTED.  The SARS-CoV-2 RNA is generally detectable in upper respiratory specimens during the acute phase of infection. The lowest concentration of SARS-CoV-2 viral copies this assay can detect is 138 copies/mL. A negative result does not preclude SARS-Cov-2  infection and should not be used as the sole basis for treatment or other patient management decisions. A negative result may occur with  improper specimen collection/handling, submission of specimen other than nasopharyngeal swab, presence of viral mutation(s) within the areas targeted by this assay, and inadequate number of viral copies(<138 copies/mL). A negative result must be combined with clinical observations, patient history, and epidemiological information. The expected result is Negative.  Fact Sheet for Patients:  EntrepreneurPulse.com.au  Fact Sheet for Healthcare Providers:  IncredibleEmployment.be  This test is no t yet approved or cleared by the [place] FDA and  has been authorized for detection and/or diagnosis of SARS-CoV-2 by FDA under an Emergency Use Authorization (EUA). This EUA will remain  in effect (meaning this test can be used) for the duration of the COVID-19 declaration under Section 564(b)(1) of the Act, 21 U.S.C.section 360bbb-3(b)(1), unless the authorization is terminated  or revoked sooner.       Influenza A by PCR NEGATIVE NEGATIVE Final   Influenza B by PCR NEGATIVE NEGATIVE Final    Comment: (NOTE) The Xpert Xpress  SARS-CoV-2/FLU/RSV plus assay is intended as an aid in the diagnosis of influenza from Nasopharyngeal swab specimens and should not be used as a sole basis for treatment. Nasal washings and aspirates are unacceptable for Xpert Xpress SARS-CoV-2/FLU/RSV testing.  Fact Sheet for Patients: EntrepreneurPulse.com.au  Fact Sheet for Healthcare Providers: IncredibleEmployment.be  This test is not yet approved or cleared by the [place] FDA and has been authorized for detection and/or diagnosis of SARS-CoV-2 by FDA under an Emergency Use Authorization (EUA). This EUA will remain in effect (meaning this test can be used) for the duration of the COVID-19 declaration under Section 564(b)(1) of the Act, 21 U.S.C. section 360bbb-3(b)(1), unless the authorization is terminated or revoked.  Performed at [facility], [address] [place], [place] [address]   MRSA Next Gen by PCR, Nasal     Status: None   Collection Time: 01/24/21  8:33 PM   Specimen: Nasal Mucosa; Nasal Swab  Result Value Ref Range Status   MRSA by PCR Next Gen NOT DETECTED NOT DETECTED Final    Comment: (NOTE) The GeneXpert MRSA Assay (FDA approved for NASAL specimens only), is one component of a comprehensive MRSA colonization surveillance program. It is not intended to diagnose MRSA infection nor to guide or monitor treatment for MRSA infections. Test performance is not FDA approved in patients less than 81 years old. Performed at [facility], [address] [place],  [address]      Labs: BNP (last 3 results) Recent Labs    05/10/20 1444  BNP 32.0   Basic Metabolic Panel: Recent Labs  Lab 01/24/21 1413 01/24/21 1805 01/24/21 2106 01/25/21 0056 01/25/21 0451  NA 136 137 135 136 137  K 3.7 3.5 3.6 3.5 3.5  CL 102 104 102 103 103  CO2 _0 GLUCOSE 250* 200* 191* 169* 178*  BUN _1 CREATININE 0.85 0.93 0.80 0.68  0.68  CALCIUM 9.0 9.1 9.2 9.0 9.0   Liver Function Tests: Recent Labs  Lab 01/24/21 0748  AST 23  ALT 24  ALKPHOS 72  BILITOT 1.1  PROT 8.4*  ALBUMIN 5.2*   Recent Labs  Lab 01/24/21 0748  LIPASE 47   No results for input(s): AMMONIA in the last 168 hours. CBC: Recent Labs  Lab 01/24/21 0748  WBC 14.9*  HGB 16.7  HCT 49.2  MCV 78.6*  PLT 278   Cardiac Enzymes: No results for input(s): CKTOTAL, CKMB, CKMBINDEX, TROPONINI in the last 168 hours. BNP: Invalid input(s): POCBNP CBG: Recent Labs  Lab 01/25/21 1204 01/25/21 1516 01/25/21 2127 01/26/21 0748 01/26/21 1157  GLUCAP 161* 323* 204* 328* 229*   D-Dimer No results for input(s): DDIMER in the last 72 hours. Hgb A1c Recent Labs    01/24/21 1021 01/24/21 1413  HGBA1C 11.2* 11.6*   Lipid Profile No results for input(s): CHOL, HDL, LDLCALC, TRIG, CHOLHDL, LDLDIRECT in the last 72 hours. Thyroid function studies No results for input(s): TSH, T4TOTAL, T3FREE, THYROIDAB in the last 72 hours.  Invalid input(s): FREET3 Anemia work up No results for input(s): VITAMINB12, FOLATE, FERRITIN, TIBC, IRON, RETICCTPCT in the last 72 hours. Urinalysis    Component Value Date/Time   COLORURINE YELLOW (A) 01/24/2021 0748   APPEARANCEUR CLEAR (A) 01/24/2021 0748   LABSPEC 1.030 01/24/2021 0748   PHURINE 7.0 01/24/2021 0748   GLUCOSEU >=500 (A) 01/24/2021 0748   HGBUR MODERATE (A) 01/24/2021 0748   BILIRUBINUR NEGATIVE 01/24/2021 0748   KETONESUR 80 (A) 01/24/2021 0748   PROTEINUR 30 (A) 01/24/2021 0748   NITRITE NEGATIVE 01/24/2021 0748   LEUKOCYTESUR NEGATIVE 01/24/2021 0748   Sepsis Labs Invalid input(s): PROCALCITONIN,  WBC,  LACTICIDVEN Microbiology Recent Results (from the past 240 hour(s))  Resp Panel by RT-PCR (Flu A&B, Covid) Nasopharyngeal Swab     Status: None   Collection Time: 01/24/21 11:38 AM   Specimen: Nasopharyngeal Swab; Nasopharyngeal(NP) swabs in vial transport medium  Result Value Ref  Range Status   SARS Coronavirus 2 by RT PCR NEGATIVE NEGATIVE Final    Comment: (NOTE) SARS-CoV-2 target nucleic acids are NOT DETECTED.  The SARS-CoV-2 RNA is generally detectable in upper respiratory specimens during the acute phase of infection. The lowest concentration of SARS-CoV-2 viral copies this assay can detect is 138 copies/mL. A negative result does not preclude SARS-Cov-2 infection and should not be used as the sole basis for treatment or other patient management decisions. A negative result may occur with  improper specimen collection/handling, submission of specimen other than nasopharyngeal swab, presence of viral mutation(s) within the areas targeted by this assay, and inadequate number of viral copies(<138 copies/mL). A negative result must be combined with clinical observations, patient history, and epidemiological information. The expected result is Negative.  Fact Sheet for Patients:  EntrepreneurPulse.com.au  Fact Sheet for Healthcare Providers:  IncredibleEmployment.be  This test is no t yet approved or cleared by the [place] FDA and  has been authorized for detection and/or diagnosis of SARS-CoV-2 by FDA under an Emergency Use Authorization (EUA). This EUA will remain  in effect (meaning this test can be used) for the duration of the COVID-19 declaration under Section 564(b)(1) of the Act, 21 U.S.C.section 360bbb-3(b)(1), unless the authorization is terminated  or revoked sooner.       Influenza A by PCR NEGATIVE NEGATIVE Final   Influenza B by PCR NEGATIVE NEGATIVE Final    Comment: (NOTE) The Xpert Xpress SARS-CoV-2/FLU/RSV plus assay is intended as an aid in the diagnosis of influenza from Nasopharyngeal swab specimens and should not be used as a sole basis for treatment. Nasal washings and aspirates are unacceptable for Xpert Xpress SARS-CoV-2/FLU/RSV testing.  Fact Sheet for  Patients: EntrepreneurPulse.com.au  Fact Sheet for Healthcare Providers: IncredibleEmployment.be  This test is not yet approved or cleared by the [place] FDA and has been authorized for detection and/or diagnosis of SARS-CoV-2 by FDA  under an Emergency Use Authorization (EUA). This EUA will remain in effect (meaning this test can be used) for the duration of the COVID-19 declaration under Section 564(b)(1) of the Act, 21 U.S.C. section 360bbb-3(b)(1), unless the authorization is terminated or revoked.  Performed at [facility], [address] [place], [place] [address]   MRSA Next Gen by PCR, Nasal     Status: None   Collection Time: 01/24/21  8:33 PM   Specimen: Nasal Mucosa; Nasal Swab  Result Value Ref Range Status   MRSA by PCR Next Gen NOT DETECTED NOT DETECTED Final    Comment: (NOTE) The GeneXpert MRSA Assay (FDA approved for NASAL specimens only), is one component of a comprehensive MRSA colonization surveillance program. It is not intended to diagnose MRSA infection nor to guide or monitor treatment for MRSA infections. Test performance is not FDA approved in patients less than 10 years old. Performed at [facility], [address] [place], [place] [address]      Time coordinating discharge: Over 30 minutes  SIGNED:   Sidney Ace, MD  [facility] 01/26/2021, 1:46 PM Pager   If 7PM-7AM, please contact night-coverage

## 2021-01-26 NOTE — TOC CM/SW Note (Signed)
Patient has orders to discharge home today. Chart reviewed. PCP is Donald Fisher, MD. On room air. No wounds. No TOC needs identified. CSW signing off.  Ashrith Sagan, CSW [phone]  

## 2021-01-26 NOTE — Progress Notes (Signed)
Patient c/o 10/10 abdominal pain. 15mg  oxycodone offered; patient refused. Patient then refused all morning medications with the exception of Semglee. Patient states he does want scheduled Ensure. Will offer robaxin and give scheduled Bentyl. Lurlean Horns, 2C Director and Dr. Priscella Mann aware of patient complaints and requests.

## 2021-01-28 ENCOUNTER — Telehealth: Payer: Self-pay | Admitting: Family Medicine

## 2021-01-28 DIAGNOSIS — E1169 Type 2 diabetes mellitus with other specified complication: Secondary | ICD-10-CM

## 2021-01-28 NOTE — Telephone Encounter (Signed)
Medication: glucose blood (ONE TOUCH ULTRA TEST) test strip [[id number]]   Has the patient contacted their pharmacy? YES Advised to call the office (Agent: If no, request that the patient contact the pharmacy for the refill.) (Agent: If yes, when and what did the pharmacy advise?)  Preferred Pharmacy (with phone number or street name): [facility] [place], [place] [address] Phone: [phone] Fax: [phone] Hours: Not open 24 hours   Has the patient been seen for an appointment in the last year OR does the patient have an upcoming appointment? [facility] Follow 11/22  Agent: Please be advised that RX refills may take up to 3 business days. We ask that you follow-up with your pharmacy.

## 2021-01-29 MED ORDER — ONETOUCH ULTRA BLUE VI STRP: ORAL_STRIP | 9 refills | Status: DC

## 2021-01-29 NOTE — Telephone Encounter (Signed)
Requested Prescriptions  Pending Prescriptions Disp Refills  . glucose blood (ONE TOUCH ULTRA TEST) test strip 100 each 9    Sig: Check blood sugar 3 times daily E11.65 (uncontrolled insulin dependent diabetes)     Endocrinology: Diabetes - Testing Supplies Passed - 01/28/2021 11:32 AM      Passed - Valid encounter within last 12 months    Recent Outpatient Visits          2 months ago Type 2 diabetes mellitus with other specified complication, without long-term current use of insulin ([place])   [facility] Birdie Sons, MD   6 months ago Type 2 diabetes mellitus with other specified complication, without long-term current use of insulin ([place])   [facility] Birdie Sons, MD   8 months ago Uncontrolled type 2 diabetes mellitus with hyperglycemia [facility])   [facility] Birdie Sons, MD   1 year ago Uncontrolled type 2 diabetes mellitus with hyperglycemia [facility])   [facility] Birdie Sons, MD   1 year ago Chronic bilateral low back pain with sciatica, sciatica laterality unspecified   [facility] Birdie Sons, MD      Future Appointments            In 3 weeks Jerrol Banana., MD [facility], [place]   In 1 month Fisher, Kirstie Peri, MD [facility], [place]

## 2021-01-31 NOTE — Telephone Encounter (Signed)
Left message advising pt to find out which test strips his insurance company will pay for.  PEC please advise when he calls back.   Thanks,   -Mickel Baas

## 2021-01-31 NOTE — Telephone Encounter (Signed)
PA needed for this Rx, Mandy called from pharmacy listed  [place], [address]  [place] [address]  Phone: [phone] Fax: [phone]

## 2021-02-01 ENCOUNTER — Encounter: Payer: Self-pay | Admitting: Gastroenterology

## 2021-02-01 ENCOUNTER — Other Ambulatory Visit: Payer: Self-pay

## 2021-02-01 ENCOUNTER — Ambulatory Visit (INDEPENDENT_AMBULATORY_CARE_PROVIDER_SITE_OTHER): Payer: BC Managed Care – PPO | Admitting: Gastroenterology

## 2021-02-01 VITALS — BP 98/61 | HR 74 | Temp 98.4°F | Ht 68.0 in | Wt 212.8 lb

## 2021-02-01 DIAGNOSIS — K3184 Gastroparesis: Secondary | ICD-10-CM | POA: Diagnosis not present

## 2021-02-01 DIAGNOSIS — R935 Abnormal findings on diagnostic imaging of other abdominal regions, including retroperitoneum: Secondary | ICD-10-CM

## 2021-02-01 DIAGNOSIS — E1143 Type 2 diabetes mellitus with diabetic autonomic (poly)neuropathy: Secondary | ICD-10-CM | POA: Diagnosis not present

## 2021-02-01 LAB — HM DIABETES EYE EXAM

## 2021-02-01 MED ORDER — PEG 3350-KCL-NA BICARB-NACL 420 G PO SOLR: ORAL | 0 refills | Status: DC

## 2021-02-01 NOTE — Progress Notes (Signed)
Jonathon Bellows MD, MRCP(U.K) [address]  [place]  [place], [place] [address]  Main: [phone]  Fax: [phone]   Gastroenterology Consultation  Referring Provider:   ER visit Primary Care Physician:  Birdie Sons, MD Primary Gastroenterologist:  Dr. Jonathon Bellows  Reason for Consultation:     Colitis        HPI:   Vincent Hall is a 55 y.o. y/o male referred for colitis.    Patient has previously been seen at our office back in July 2020 by Dr. Bonna Gains for proctocolitis.  At that point of time the patient had been to the ER for constipation and abdominal pain with a CT showing mild circumferential wall thickening of the transverse and descending colon as well as the rectum.  Discharge on antibiotics he denies any diarrhea at that point of time.  Was having some dysphagia.  Has had prior colon polyps.  The plan was to perform a colonoscopy which was done in July 2020 by Dr. Bonna Gains which showed congestion in the cecum and biopsies were taken.  Otherwise rest of the colon appeared normal.  An upper endoscopy was performed on the same day.  That showed no abnormalities except for mild erythema at the antrum.  Biopsies of the stomach showed reactive gastropathy and biopsies of the esophagus showed no abnormality.  Cecal biopsies showed increased mucosal eosinophils up to 200 per high-power field.Marland Kitchen  He was advised to stop any NSAIDs.  And since he had no diarrhea or abdominal pain did not need any further work-up..  In November 2021 I see that he had an EGD at [facility] which was done for abdominal pain.  I cannot see the endoscopy report.  On 01/24/2021 was admitted to the hospital with nausea vomiting and abdominal pain and found to have DKA.  He underwent a CT abdomen which showed likely diabetic gastroparesis in the setting of poorly controlled diabetes mellitus and was commenced on Reglan.  11/27/2020 underwent a CT scan of the abdomen in the ER that showed apparent  thickening in the proximal to mid transverse colon and possibly within the cecum concerning for colitis or mass cannot be excluded in the cecal area.  Colonoscopy was recommended when acute symptoms resolved exclude a mass lesion  01/24/2021: HbA1c 11.6, Hemoglobin 16.7, CMP showed no abnormalities in liver function test but an elevated anion gap and blood sugars in the setting of DKA  He states that previously was on oxycodone for back pain and was leading to severe constipation and presently he is giving off of it and it has improved his bowel movements and presently has 1 soft bowel movement a day.  No other complaints.  Not having any vomiting no abdominal pain no NSAID use no diarrhea.  Past Medical History:  Diagnosis Date   Anxiety    Asthma    Bipolar 2 disorder ([place])    Coronary artery disease    Diabetes (HCC)    Fatty liver    History of MI (myocardial infarction)    STEMI (ST elevation myocardial infarction) ([place]) 06/22/2013    Past Surgical History:  Procedure Laterality Date   APPENDECTOMY     CARDIAC CATHETERIZATION  06/24/2013   COLONOSCOPY WITH PROPOFOL N/A 09/23/2014   Procedure: COLONOSCOPY WITH PROPOFOL;  Surgeon: Lucilla Lame, MD;  Location: [facility];  Service: Endoscopy;  Laterality: N/A;   COLONOSCOPY WITH PROPOFOL N/A 10/30/2018   Procedure: COLONOSCOPY WITH PROPOFOL;  Surgeon: Vonda Antigua  B, MD;  Location: [facility] ENDOSCOPY;  Service: Endoscopy;  Laterality: N/A;   CORONARY ANGIOPLASTY     stent placement to LAD   CORONARY ARTERY BYPASS GRAFT  07/31/2013   x 3 vessels   ESOPHAGOGASTRODUODENOSCOPY (EGD) WITH PROPOFOL N/A 10/30/2018   Procedure: ESOPHAGOGASTRODUODENOSCOPY (EGD) WITH PROPOFOL;  Surgeon: Virgel Manifold, MD;  Location: [facility] ENDOSCOPY;  Service: Endoscopy;  Laterality: N/A;   FINGER SURGERY     NASAL SINUS SURGERY     POLYPECTOMY  09/23/2014   Procedure: POLYPECTOMY;  Surgeon: Lucilla Lame, MD;  Location: [facility];   Service: Endoscopy;;    Prior to Admission medications   Medication Sig Start Date End Date Taking? Authorizing Provider  albuterol (VENTOLIN HFA) 108 (90 Base) MCG/ACT inhaler Inhale 2 puffs into the lungs every 6 (six) hours as needed for wheezing or shortness of breath. 05/10/20  Yes Birdie Sons, MD  aspirin 81 MG tablet Take 81 mg by mouth daily.   Yes [provider]  Blood Glucose Monitoring Suppl (ONE TOUCH ULTRA 2) w/Device KIT USE AS DIRECTED 05/16/18  Yes Birdie Sons, MD  citalopram (CELEXA) 40 MG tablet Take 1 tablet (40 mg total) by mouth daily. 05/10/20  Yes Birdie Sons, MD  glipiZIDE (GLUCOTROL XL) 5 MG 24 hr tablet TAKE 1 TABLET BY MOUTH ONCE DAILY WITH BREAKFAST 12/05/20  Yes Birdie Sons, MD  glucose blood (ONE TOUCH ULTRA TEST) test strip Check blood sugar 3 times daily E11.65 (uncontrolled insulin dependent diabetes) 01/29/21  Yes Birdie Sons, MD  insulin detemir (LEVEMIR FLEXTOUCH) 100 UNIT/ML FlexPen Inject 30 Units into the skin daily. 01/26/21 02/25/21 Yes Sreenath, Sudheer B, MD  insulin lispro (HUMALOG KWIKPEN) 200 UNIT/ML KwikPen Use before meals up to three times a day. Take 6 units if sugar is over 200. 8 units if over 250 and 10 units if over 300. 04/16/20  Yes Fisher, Kirstie Peri, MD  Insulin Pen Needle 32G X 4 MM MISC 1 each by Does not apply route 4 (four) times daily -  before meals and at bedtime. 01/26/21  Yes Sreenath, Sudheer B, MD  lisinopril (ZESTRIL) 20 MG tablet Take 1 tablet (20 mg total) by mouth daily. 01/27/21 02/26/21 Yes Sreenath, Sudheer B, MD  methocarbamol (ROBAXIN) 750 MG tablet Take 1 tablet (750 mg total) by mouth every 6 (six) hours as needed for muscle spasms (back pain). 01/26/21  Yes Sreenath, Sudheer B, MD  metoCLOPramide (REGLAN) 10 MG tablet Take 1 tablet (10 mg total) by mouth 4 (four) times daily -  before meals and at bedtime. 01/26/21 02/25/21 Yes Sreenath, Sudheer B, MD  nitroGLYCERIN (NITROSTAT) 0.4 MG SL tablet  Place 1 tablet (0.4 mg total) under the tongue every 5 (five) minutes as needed for chest pain. 05/10/20  Yes Birdie Sons, MD  omeprazole (PRILOSEC) 40 MG capsule Take 1 capsule (40 mg total) by mouth daily. 11/27/20 02/01/21 Yes Lavonia Drafts, MD  oxyCODONE (ROXICODONE) 15 MG immediate release tablet Take 1 tablet (15 mg total) by mouth every 4 (four) hours as needed for pain. 01/06/21  Yes Birdie Sons, MD  pioglitazone (ACTOS) 30 MG tablet Take 1 tablet (30 mg total) by mouth daily. 04/23/20  Yes Birdie Sons, MD  rosuvastatin (CRESTOR) 40 MG tablet TAKE 1 TABLET BY MOUTH ONCE DAILY 12/05/20  Yes Birdie Sons, MD  Semaglutide,0.25 or 0.5MG/DOS, (OZEMPIC, 0.25 OR 0.5 MG/DOSE,) 2 MG/1.5ML SOPN Inject 0.25 mg as directed once a week.  05/10/20  Yes Birdie Sons, MD  traZODone (DESYREL) 100 MG tablet TAKE 2 TABLETS BY MOUTH AT BEDTIME 12/27/20  Yes Birdie Sons, MD    Family History  Problem Relation Age of Onset   Hyperlipidemia Mother    Heart attack Father    Hyperlipidemia Father    Diabetes Father    Heart disease Father      Social History   Tobacco Use   Smoking status: Former    Packs/day: 1.50    Years: 30.00    Pack years: 45.00    Types: Cigarettes    Quit date: 08/08/2013    Years since quitting: 7.4   Smokeless tobacco: Never  Vaping Use   Vaping Use: Never used  Substance Use Topics   Alcohol use: No   Drug use: Never    Allergies as of 02/01/2021 - Review Complete 02/01/2021  Allergen Reaction Noted   Montelukast Hives 06/03/2015   Depakote [divalproex sodium] Rash 01/11/2018   Invokana [canagliflozin] Other (See Comments) 06/17/2018   Metformin and related  01/06/2020   Atorvastatin Other (See Comments) 08/01/2013   Singulair [montelukast sodium] Hives 09/18/2014    Review of Systems:    All systems reviewed and negative except where noted in HPI.   Physical Exam:  BP 98/61   Pulse 74   Temp 98.4 F (36.9 C) (Oral)   Ht '5\' 8"'  (1.727  m)   Wt 212 lb 12.8 oz (96.5 kg)   BMI 32.36 kg/m  No LMP for male patient. Psych:  Alert and cooperative. Normal mood and affect. General:   Alert,  Well-developed, well-nourished, pleasant and cooperative in NAD Head:  Normocephalic and atraumatic. Eyes:  Sclera clear, no icterus.   Conjunctiva pink. Ears:  Normal auditory acuity. Lungs:  Respirations even and unlabored.  Clear throughout to auscultation.   No wheezes, crackles, or rhonchi. No acute distress. Heart:  Regular rate and rhythm; no murmurs, clicks, rubs, or gallops. Abdomen:  Normal bowel sounds.  No bruits.  Soft, non-tender and non-distended without masses, hepatosplenomegaly or hernias noted.  No guarding or rebound tenderness.    Neurologic:  Alert and oriented x3;  grossly normal neurologically. Psych:  Alert and cooperative. Normal mood and affect.  Imaging Studies: CT ABDOMEN PELVIS W CONTRAST  Result Date: 01/24/2021 CLINICAL DATA:  Abdominal pain, possible abscess, possible obstruction. Withdrawal symptoms. EXAM: CT ABDOMEN AND PELVIS WITH CONTRAST TECHNIQUE: Multidetector CT imaging of the abdomen and pelvis was performed using the standard protocol following bolus administration of intravenous contrast. CONTRAST:  132m OMNIPAQUE IOHEXOL 300 MG/ML  SOLN COMPARISON:  11/27/2020 FINDINGS: Lower chest: Coronary calcifications. Previous CABG. No pleural or pericardial effusion. Visualized lung bases clear. Hepatobiliary: No focal liver abnormality is seen. No gallstones, gallbladder wall thickening, or biliary dilatation. Pancreas: Unremarkable. No pancreatic ductal dilatation or surrounding inflammatory changes. Spleen: Normal in size without focal abnormality. Adrenals/Urinary Tract: Adrenal glands are unremarkable. Kidneys are normal, without renal calculi, focal lesion, or hydronephrosis. Bladder is unremarkable. Stomach/Bowel: Partial distention of the stomach by fluid. The small bowel is nondilated. Post appendectomy.  The colon is nondilated, unremarkable. Vascular/Lymphatic: Moderate aortoiliac calcified plaque without aneurysm. Portal vein patent. No abdominal or pelvic adenopathy. Reproductive: Prostate is unremarkable. Other: No ascites.  No free air. Musculoskeletal: Sternotomy wires. No fracture or worrisome bone lesion. IMPRESSION: 1. No acute findings. 2.  Aortic Atherosclerosis (ICD10-170.0). Electronically Signed   By: DLucrezia EuropeM.D.   On: 01/24/2021 11:23    Assessment and  Plan:   MELTON WALLS is a 55 y.o. y/o male has been referred for colitis seen on ER visit when he was having abdominal pain in 11/27/2020.  Concern for a cecal mass but a colonoscopy done a year prior showed no abnormality.  Very likely it was a stool ball but will make sure we repeat a colonoscopy to ensure there is no neoplasm.  He very likely has had chronic constipation.  Secondary to oxycodone use.  In addition likely has diabetic gastroparesis.  Talk to him about dietary management and going off oxycodone which would benefit him in the long run.  I have provided him patient information about gastroparesis and diet which should be low in fiber and saturated fats explained that in main factor in gastroparesis of blood glucose levels.  Whenever the blood sugars go over 150 g/dL due to delayed gastric emptying.  Tight glycemic control will help with the emptying.  His HbA1c was over 11 when last checked translating to an approximate random blood sugar of 220.   I have discussed alternative options, risks & benefits,  which include, but are not limited to, bleeding, infection, perforation,respiratory complication & drug reaction.  The patient agrees with this plan & written consent will be obtained.    Follow up in 3 months  Dr Jonathon Bellows MD,MRCP(U.K)

## 2021-02-01 NOTE — Patient Instructions (Signed)
Gastroparesis  Gastroparesis is a condition in which food takes longer than normal to empty from the stomach. This condition is also known as delayed gastric emptying. It is usually a long-term (chronic) condition. There is no cure, but there are treatments and things that you can do at home to help relieve symptoms. Treating the underlying condition that causesgastroparesis can also help relieve symptoms. What are the causes? In many cases, the cause of this condition is not known. Possible causes include: A hormone (endocrine) disorder, such as hypothyroidism or diabetes. A nervous system disease, such as Parkinson's disease or multiple sclerosis. Cancer, infection, or surgery that affects the stomach or vagus nerve. The vagus nerve runs from your chest, through your neck, and to the lower part of your brain. A connective tissue disorder, such as scleroderma. Certain medicines. What increases the risk? You are more likely to develop this condition if: You have certain disorders or diseases. These may include: An endocrine disorder. An eating disorder. Amyloidosis. Scleroderma. Parkinson's disease. Multiple sclerosis. Cancer or infection of the stomach or the vagus nerve. You have had surgery on your stomach or vagus nerve. You take certain medicines. You are male. What are the signs or symptoms? Symptoms of this condition include: Feeling full after eating very little or a loss of appetite. Nausea, vomiting, or heartburn. Bloating of your abdomen. Inconsistent blood sugar (glucose) levels on blood tests. Unexplained weight loss. Acid from the stomach coming up into the esophagus (gastroesophageal reflux). Sudden tightening (spasm) of the stomach, which can be painful. Symptoms may come and go. Some people may not notice any symptoms. How is this diagnosed? This condition is diagnosed with tests, such as: Tests that check how long it takes food to move through the stomach and  intestines. These tests include: Upper gastrointestinal (GI) series. For this test, you drink a liquid that shows up well on X-rays, and then X-rays are taken of your intestines. Gastric emptying scintigraphy. For this test, you eat food that contains a small amount of radioactive material, and then scans are taken. Wireless capsule GI monitoring system. For this test, you swallow a pill (capsule) that records information about how foods and fluid move through your stomach. Gastric manometry. For this test, a tube is passed down your throat and into your stomach to measure electrical and muscular activity. Endoscopy. For this test, a long, thin tube with a camera and light on the end is passed down your throat and into your stomach to check for problems in your stomach lining. Ultrasound. This test uses sound waves to create images of the inside of your body. This can help rule out gallbladder disease or pancreatitis as a cause of your symptoms. How is this treated? There is no cure for this condition, but treatment and home care may relieve symptoms. Treatment may include: Treating the underlying cause. Managing your symptoms by making changes to your diet and exercise habits. Taking medicines to control nausea and vomiting and to stimulate stomach muscles. Getting food through a feeding tube in the hospital. This may be done in severe cases. Having surgery to insert a device called a gastric electrical stimulator into your body. This device helps improve stomach emptying and control nausea and vomiting. Follow these instructions at home: Take over-the-counter and prescription medicines only as told by your health care provider. Follow instructions from your health care provider about eating or drinking restrictions. Your health care provider may recommend that you: Eat smaller meals more often. Eat low-fat   foods. Eat low-fiber forms of high-fiber foods. For example, eat cooked vegetables instead  of raw vegetables. Have only liquid foods instead of solid foods. Liquid foods are easier to digest. Drink enough fluid to keep your urine pale yellow. Exercise as often as told by your health care provider. Keep all follow-up visits. This is important. Contact a health care provider if you: Notice that your symptoms do not improve with treatment. Have new symptoms. Get help right away if you: Have severe pain in your abdomen that does not improve with treatment. Have nausea that is severe or does not go away. Vomit every time you drink fluids. Summary Gastroparesis is a long-term (chronic) condition in which food takes longer than normal to empty from the stomach. Symptoms include nausea, vomiting, heartburn, bloating of your abdomen, and loss of appetite. Eating smaller portions, low-fat foods, and low-fiber forms of high-fiber foods may help you manage your symptoms. Get help right away if you have severe pain in your abdomen. This information is not intended to replace advice given to you by your health care provider. Make sure you discuss any questions you have with your healthcare provider. Document Revised: 08/04/2019 Document Reviewed: 08/04/2019 Elsevier Patient Education  2022 Elsevier Inc.  

## 2021-02-02 ENCOUNTER — Ambulatory Visit: Payer: BC Managed Care – PPO | Admitting: Family Medicine

## 2021-02-02 ENCOUNTER — Encounter: Payer: Self-pay | Admitting: Family Medicine

## 2021-02-02 VITALS — BP 118/68 | HR 69 | Temp 98.3°F | Resp 18 | Wt 211.0 lb

## 2021-02-02 DIAGNOSIS — E1110 Type 2 diabetes mellitus with ketoacidosis without coma: Secondary | ICD-10-CM

## 2021-02-02 DIAGNOSIS — R0789 Other chest pain: Secondary | ICD-10-CM

## 2021-02-02 DIAGNOSIS — G8929 Other chronic pain: Secondary | ICD-10-CM

## 2021-02-02 DIAGNOSIS — Z23 Encounter for immunization: Secondary | ICD-10-CM

## 2021-02-02 DIAGNOSIS — M545 Low back pain, unspecified: Secondary | ICD-10-CM | POA: Diagnosis not present

## 2021-02-02 DIAGNOSIS — M533 Sacrococcygeal disorders, not elsewhere classified: Secondary | ICD-10-CM

## 2021-02-02 MED ORDER — OXYCODONE HCL 15 MG PO TABS: 15.0000 mg | ORAL_TABLET | ORAL | 0 refills | Status: DC | PRN

## 2021-02-02 MED ORDER — CONTOUR TEST VI STRP: ORAL_STRIP | 12 refills | Status: AC

## 2021-02-02 MED ORDER — GLUCOSE BLOOD VI STRP: ORAL_STRIP | 4 refills | Status: DC

## 2021-02-02 NOTE — Telephone Encounter (Signed)
Patient states his insurance covers Contour test strips. Prescription sent into pharmacy.

## 2021-02-02 NOTE — Addendum Note (Signed)
Addended by: Randal Buba on: 02/02/2021 04:20 PM   Modules accepted: Orders

## 2021-02-02 NOTE — Progress Notes (Signed)
Established patient visit   Patient: Vincent Hall   DOB: 1965/10/01   55 y.o. Male  MRN: [id number] Visit Date: 02/02/2021  Today's healthcare provider: [name], MD   Chief Complaint  Patient presents with   Hospitalization Follow-up   Subjective    HPI  Follow up Hospitalization  Patient was admitted to [facility] on 01/24/2021 and discharged on 01/26/2021. He was treated for DKA. Prior to his hospitalization he was last seen for follow up of diabetes on 11/08/2020 at which time his A1c was 7.5. At the time of admission it was 11.2. DKA was precipitated by episode of abdominal pain, nausea and vomiting during which he was unable to eat or drink and missed his insulin for a few days.  Treatment for this included IV insulin infusion and IV fluids. Per discharge summary, Lantus 30 units daily was added to home diabetic regimen.  Patient was encouraged to seek consultation with a endocrinologist in the outpatient setting.  At time of discharge discontinuation of all narcotic therapy was strongly recommend.  Bentyl 4 times daily, as needed Robaxin, Levemir was prescribed. Telephone follow up was not done. He reports good compliance with treatment. He reports this condition is improved. Patient reports that the abdominal pain, nausea and vomiting have resolved. He has had GI follow up with Dr. Vicente Males and is scheduled for colonoscopy on 02-09-2021. Home blood sugars have averaged 130-175.    Medications: Outpatient Medications Prior to Visit  Medication Sig   albuterol (VENTOLIN HFA) 108 (90 Base) MCG/ACT inhaler Inhale 2 puffs into the lungs every 6 (six) hours as needed for wheezing or shortness of breath.   aspirin 81 MG tablet Take 81 mg by mouth daily.   Blood Glucose Monitoring Suppl (ONE TOUCH ULTRA 2) w/Device KIT USE AS DIRECTED   citalopram (CELEXA) 40 MG tablet Take 1 tablet (40 mg total) by mouth daily.   glipiZIDE (GLUCOTROL XL) 5 MG 24 hr tablet TAKE 1 TABLET BY  MOUTH ONCE DAILY WITH BREAKFAST   glucose blood (ONE TOUCH ULTRA TEST) test strip Check blood sugar 3 times daily E11.65 (uncontrolled insulin dependent diabetes)   insulin detemir (LEVEMIR FLEXTOUCH) 100 UNIT/ML FlexPen Inject 30 Units into the skin daily.   insulin lispro (HUMALOG KWIKPEN) 200 UNIT/ML KwikPen Use before meals up to three times a day. Take 6 units if sugar is over 200. 8 units if over 250 and 10 units if over 300.   Insulin Pen Needle 32G X 4 MM MISC 1 each by Does not apply route 4 (four) times daily -  before meals and at bedtime.   lisinopril (ZESTRIL) 20 MG tablet Take 1 tablet (20 mg total) by mouth daily.   methocarbamol (ROBAXIN) 750 MG tablet Take 1 tablet (750 mg total) by mouth every 6 (six) hours as needed for muscle spasms (back pain).   metoCLOPramide (REGLAN) 10 MG tablet Take 1 tablet (10 mg total) by mouth 4 (four) times daily -  before meals and at bedtime.   nitroGLYCERIN (NITROSTAT) 0.4 MG SL tablet Place 1 tablet (0.4 mg total) under the tongue every 5 (five) minutes as needed for chest pain.   oxyCODONE (ROXICODONE) 15 MG immediate release tablet Take 1 tablet (15 mg total) by mouth every 4 (four) hours as needed for pain.   pioglitazone (ACTOS) 30 MG tablet Take 1 tablet (30 mg total) by mouth daily.   rosuvastatin (CRESTOR) 40 MG tablet TAKE 1 TABLET BY MOUTH ONCE DAILY  Semaglutide,0.25 or 0.5MG/DOS, (OZEMPIC, 0.25 OR 0.5 MG/DOSE,) 2 MG/1.5ML SOPN Inject 0.25 mg as directed once a week.   traZODone (DESYREL) 100 MG tablet TAKE 2 TABLETS BY MOUTH AT BEDTIME   omeprazole (PRILOSEC) 40 MG capsule Take 1 capsule (40 mg total) by mouth daily.   polyethylene glycol-electrolytes (NULYTELY) 420 g solution Prepare according to package instructions. Starting at 5:00 PM: Drink one 8 oz glass of mixture every 15 minutes until you finish half of the jug. Five hours prior to procedure, drink 8 oz glass of mixture every 15 minutes until it is all gone. Make sure you do not  drink anything 4 hours prior to your procedure. (Patient not taking: Reported on 02/02/2021)   No facility-administered medications prior to visit.    Review of Systems  Constitutional:  Negative for appetite change, chills and fever.  Respiratory:  Negative for chest tightness, shortness of breath and wheezing.   Cardiovascular:  Negative for chest pain and palpitations.  Gastrointestinal:  Negative for abdominal pain, nausea and vomiting.      Objective    BP 118/68 (BP Location: Right Arm, Patient Position: Sitting, Cuff Size: Normal)   Pulse 69   Temp 98.3 F (36.8 C) (Temporal)   Resp 18   Wt 211 lb (95.7 kg)   SpO2 99%   BMI 32.08 kg/m  {Show previous vital signs (optional):23777}  Physical Exam  General appearance: Mildly obese male, cooperative and in no acute distress Head: Normocephalic, without obvious abnormality, atraumatic Respiratory: Respirations even and unlabored, normal respiratory rate Extremities: All extremities are intact.  Skin: Skin color, texture, turgor normal. No rashes seen  Psych: Appropriate mood and affect. Neurologic: Mental status: Alert, oriented to person, place, and time, thought content appropriate.     Assessment & Plan     1. Diabetic ketoacidosis without coma associated with type 2 diabetes mellitus ([place]) Precipitated by abdominal pain, nausea, vomiting, poor PO intake and not take insulin for 2 days. Symptoms now resolved, back on maintenance medications. GI work up in progress. On low dose of Ozempic. Continue current medications.  Discussed looking into insurance coverage for CGM. He states his insurance now requires he use [employer] countor test strips and has new meter on the way.   2. Chronic midline low back pain without sciatica refill- oxyCODONE (ROXICODONE) 15 MG immediate release tablet; Take 1 tablet (15 mg total) by mouth every 4 (four) hours as needed for pain.  Dispense: 180 tablet; Refill: 0  3. Chest wall  pain refill- oxyCODONE (ROXICODONE) 15 MG immediate release tablet; Take 1 tablet (15 mg total) by mouth every 4 (four) hours as needed for pain.  Dispense: 180 tablet; Refill: 0  4. Chronic left SI joint pain refill- oxyCODONE (ROXICODONE) 15 MG immediate release tablet; Take 1 tablet (15 mg total) by mouth every 4 (four) hours as needed for pain.  Dispense: 180 tablet; Refill: 0  5. Need for influenza vaccination  - Flu Vaccine QUAD 36+ mos IM (Fluarix/Fluzone)   Future Appointments  Date Time Provider [place]  03/25/2021  2:00 PM Birdie Sons, MD [facility]  04/26/2021  1:30 PM Jonathon Bellows, MD [facility] None         The entirety of the information documented in the History of Present Illness, Review of Systems and Physical Exam were personally obtained by me. Portions of this information were initially documented by the CMA and reviewed by me for thoroughness and accuracy.     [name],  MD  [facility] [phone] (phone) [phone] (fax)  [place]

## 2021-02-02 NOTE — Patient Instructions (Signed)
Please review the attached list of medications and notify my office if there are any errors.   Check with your insurance to see if they cover a continuous glucose meter such as [employer] or Dexcomm

## 2021-02-08 ENCOUNTER — Other Ambulatory Visit: Payer: Self-pay | Admitting: Family Medicine

## 2021-02-08 ENCOUNTER — Encounter: Payer: Self-pay | Admitting: Gastroenterology

## 2021-02-08 NOTE — Telephone Encounter (Signed)
Requested medication (s) are due for refill today:  Not sure  Requested medication (s) are on the active medication list:   No  Future visit scheduled:   Yes   Last ordered: 09/07/2020 was the original Rx  Returned because this is not on his med list.   Requested Prescriptions  Pending Prescriptions Disp Refills   RABEprazole (ACIPHEX) 20 MG tablet [Pharmacy Med Name: RABEPRAZOLE SODIUM 20 MG DR TAB] 90 tablet     Sig: TAKE 1 TABLET BY MOUTH ONCE DAILY     Gastroenterology: Proton Pump Inhibitors Passed - 02/08/2021  1:48 PM      Passed - Valid encounter within last 12 months    Recent Outpatient Visits           6 days ago Diabetic ketoacidosis without coma associated with type 2 diabetes mellitus ([place])   [facility] Birdie Sons, MD   3 months ago Type 2 diabetes mellitus with other specified complication, without long-term current use of insulin ([place])   [facility] Birdie Sons, MD   7 months ago Type 2 diabetes mellitus with other specified complication, without long-term current use of insulin [facility])   [facility] Birdie Sons, MD   9 months ago Uncontrolled type 2 diabetes mellitus with hyperglycemia [facility])   [facility] Birdie Sons, MD   1 year ago Uncontrolled type 2 diabetes mellitus with hyperglycemia [facility])   [facility] Birdie Sons, MD       Future Appointments             In 1 month Fisher, Kirstie Peri, MD [facility], [place]   In 2 months Jonathon Bellows, MD [place]

## 2021-02-08 NOTE — Telephone Encounter (Signed)
Please review.  It looks like pt was taken off Acphex in the hospital.  See discharge summary 01/24/2021.  Thanks,   -Mickel Baas

## 2021-02-09 ENCOUNTER — Telehealth: Payer: Self-pay

## 2021-02-09 NOTE — Telephone Encounter (Signed)
Pt's wife called to report that pt has not had BM in 2 days and after completing bowel prep for procedure for today, he has still not produced any stool.... Advised to continue liquid diet until further instruction from you... Pt rescheduled for Dec 2   Please advise

## 2021-02-11 NOTE — Telephone Encounter (Signed)
Called patient to check up on him and he stated that he was able to have a bowel movement two days ago and yesterday and feeling so much better. I also confirmed with the patient that he rescheduled his colonoscopy to be done on December 2nd and he stated that it was correct. Patient was informed to call me or send me a message through MyChart if he had any questions. Patient agreed and had no questions at this time.

## 2021-02-21 ENCOUNTER — Inpatient Hospital Stay: Payer: BC Managed Care – PPO | Admitting: Family Medicine

## 2021-03-10 ENCOUNTER — Encounter: Payer: Self-pay | Admitting: Gastroenterology

## 2021-03-10 ENCOUNTER — Telehealth: Payer: Self-pay

## 2021-03-10 NOTE — Telephone Encounter (Signed)
Patient was contacted by the endoscopy unit to let him know at what time to report tomorrow to his procedure and he stated that he was not going to proceed since it was too expensive. Therefore he requested to be cancelled.

## 2021-03-11 ENCOUNTER — Ambulatory Visit
Admission: RE | Admit: 2021-03-11 | Payer: BC Managed Care – PPO | Source: Home / Self Care | Admitting: Gastroenterology

## 2021-03-11 ENCOUNTER — Encounter: Admission: RE | Payer: Self-pay | Source: Home / Self Care

## 2021-03-11 ENCOUNTER — Other Ambulatory Visit: Payer: Self-pay | Admitting: Family Medicine

## 2021-03-11 DIAGNOSIS — M545 Low back pain, unspecified: Secondary | ICD-10-CM

## 2021-03-11 DIAGNOSIS — G8929 Other chronic pain: Secondary | ICD-10-CM

## 2021-03-11 DIAGNOSIS — R0789 Other chest pain: Secondary | ICD-10-CM

## 2021-03-11 DIAGNOSIS — M533 Sacrococcygeal disorders, not elsewhere classified: Secondary | ICD-10-CM

## 2021-03-11 HISTORY — DX: Depression, unspecified: F32.A

## 2021-03-11 SURGERY — COLONOSCOPY WITH PROPOFOL
Anesthesia: General

## 2021-03-11 NOTE — Telephone Encounter (Signed)
Copied from [place] [phone]. Topic: Quick Communication - Rx Refill/Question >> Mar 11, 2021  9:20 AM Tessa Lerner A wrote: Medication: oxyCODONE (ROXICODONE) 15 MG immediate release tablet [[id number]]   insulin detemir (LEVEMIR FLEXTOUCH) 100 UNIT/ML FlexPen [[id number]  Has the patient contacted their pharmacy? No. (Agent: If no, request that the patient contact the pharmacy for the refill. If patient does not wish to contact the pharmacy document the reason why and proceed with request.) (Agent: If yes, when and what did the pharmacy advise?)  Preferred Pharmacy (with phone number or street name): [facility] [name], [place]  Phone:  [phone] Fax:  [phone]    Has the patient been seen for an appointment in the last year OR does the patient have an upcoming appointment? Yes.    Agent: Please be advised that RX refills may take up to 3 business days. We ask that you follow-up with your pharmacy.

## 2021-03-12 MED ORDER — OXYCODONE HCL 15 MG PO TABS: 15.0000 mg | ORAL_TABLET | ORAL | 0 refills | Status: DC | PRN

## 2021-03-12 MED ORDER — LEVEMIR FLEXTOUCH 100 UNIT/ML ~~LOC~~ SOPN: 30.0000 [IU] | PEN_INJECTOR | Freq: Every day | SUBCUTANEOUS | 3 refills | Status: DC

## 2021-03-12 NOTE — Telephone Encounter (Signed)
Requested medication (s) are due for refill today: oxycodone is, Levemir ordered by provider not in this practice.  Requested medication (s) are on the active medication list: yes  Future visit scheduled: 03/25/21  Notes to clinic:  Oxy is not delegated, Levemir ordered by provider not in this practice, please assess.   Requested Prescriptions  Pending Prescriptions Disp Refills   insulin detemir (LEVEMIR FLEXTOUCH) 100 UNIT/ML FlexPen 9 mL 0    Sig: Inject 30 Units into the skin daily.     Endocrinology:  Diabetes - Insulins Failed - 03/11/2021 11:48 AM      Failed - HBA1C is between 0 and 7.9 and within 180 days    Hgb A1c MFr Bld  Date Value Ref Range Status  01/24/2021 11.6 (H) 4.8 - 5.6 % Final    Comment:    (NOTE)         Prediabetes: 5.7 - 6.4         Diabetes: >6.4         Glycemic control for adults with diabetes: <7.0           Passed - Valid encounter within last 6 months    Recent Outpatient Visits           1 month ago Diabetic ketoacidosis without coma associated with type 2 diabetes mellitus [facility])   [facility] Birdie Sons, MD   4 months ago Type 2 diabetes mellitus with other specified complication, without long-term current use of insulin ([place])   [facility] Birdie Sons, MD   8 months ago Type 2 diabetes mellitus with other specified complication, without long-term current use of insulin [facility])   [facility] Birdie Sons, MD   10 months ago Uncontrolled type 2 diabetes mellitus with hyperglycemia [facility])   [facility] Birdie Sons, MD   1 year ago Uncontrolled type 2 diabetes mellitus with hyperglycemia [facility])   [facility] Birdie Sons, MD       Future Appointments             In 1 week Fisher, Kirstie Peri, MD [facility]   In 1 month Jonathon Bellows, MD [facility]             oxyCODONE (ROXICODONE) 15 MG immediate release  tablet 180 tablet 0    Sig: Take 1 tablet (15 mg total) by mouth every 4 (four) hours as needed for pain.     Not Delegated - Analgesics:  Opioid Agonists Failed - 03/11/2021 11:48 AM      Failed - This refill cannot be delegated      Failed - Urine Drug Screen completed in last 360 days      Passed - Valid encounter within last 6 months    Recent Outpatient Visits           1 month ago Diabetic ketoacidosis without coma associated with type 2 diabetes mellitus [facility])   [facility] Birdie Sons, MD   4 months ago Type 2 diabetes mellitus with other specified complication, without long-term current use of insulin [facility])   [facility] Birdie Sons, MD   8 months ago Type 2 diabetes mellitus with other specified complication, without long-term current use of insulin [facility])   [facility] Bird[facility] Sons, MD   10 months ago Uncontrolled type 2 diabetes mellitus with hyperglycemia [facility])   [facility] [name]  E, MD   1 year ago Uncontrolled type 2 diabetes mellitus with hyperglycemia [facility])   [facility] Birdie Sons, MD       Future Appointments             In 1 week Fisher, Kirstie Peri, MD [facility], [place]   In 1 month Jonathon Bellows, MD [place]

## 2021-03-13 ENCOUNTER — Other Ambulatory Visit: Payer: Self-pay | Admitting: Family Medicine

## 2021-03-13 DIAGNOSIS — E1165 Type 2 diabetes mellitus with hyperglycemia: Secondary | ICD-10-CM

## 2021-03-13 DIAGNOSIS — E1169 Type 2 diabetes mellitus with other specified complication: Secondary | ICD-10-CM

## 2021-03-25 ENCOUNTER — Encounter: Payer: Self-pay | Admitting: Family Medicine

## 2021-03-25 NOTE — Progress Notes (Deleted)
Complete physical exam   Patient: Vincent Hall   DOB: 1965/11/24   55 y.o. Male  MRN: [id number] Visit Date: 03/25/2021  Today's healthcare provider: [name], MD   No chief complaint on file.  Subjective    Lavoy GREOGORY CORNETTE is a 55 y.o. male who presents today for a complete physical exam.  He reports consuming a {diet types:17450} diet. {Exercise:19826} He generally feels {well/fairly well/poorly:18703}. He reports sleeping {well/fairly well/poorly:18703}. He {does/does not:200015} have additional problems to discuss today.  HPI  ***  Past Medical History:  Diagnosis Date   Anxiety    Asthma    Bipolar 2 disorder ([place])    Coronary artery disease    Depression    Diabetes ([place])    Fatty liver    History of MI (myocardial infarction)    STEMI (ST elevation myocardial infarction) ([place]) 06/22/2013   Past Surgical History:  Procedure Laterality Date   APPENDECTOMY     CARDIAC CATHETERIZATION  06/24/2013   COLONOSCOPY WITH PROPOFOL N/A 09/23/2014   Procedure: COLONOSCOPY WITH PROPOFOL;  Surgeon: Lucilla Lame, MD;  Location: [facility];  Service: Endoscopy;  Laterality: N/A;   COLONOSCOPY WITH PROPOFOL N/A 10/30/2018   Procedure: COLONOSCOPY WITH PROPOFOL;  Surgeon: Virgel Manifold, MD;  Location: [facility];  Service: Endoscopy;  Laterality: N/A;   CORONARY ANGIOPLASTY     stent placement to LAD   CORONARY ARTERY BYPASS GRAFT  07/31/2013   x 3 vessels   ESOPHAGOGASTRODUODENOSCOPY (EGD) WITH PROPOFOL N/A 10/30/2018   Procedure: ESOPHAGOGASTRODUODENOSCOPY (EGD) WITH PROPOFOL;  Surgeon: Virgel Manifold, MD;  Location: [facility] END[facility];  Service: Endoscopy;  Laterality: N/A;   FINGER SURGERY     NASAL SINUS SURGERY     POLYPECTOMY  09/23/2014   Procedure: POLYPECTOMY;  Surgeon: Lucilla Lame, MD;  Location: [facility];  Service: Endoscopy;;   Social History   Socioeconomic History   Marital status: Married    Spouse name: clementin    Number of children: 2   Years of education: HS Grad   Highest education level: Associate degree: occupational, Hotel manager, or vocational program  Occupational History   Occupation: [employer]  Tobacco Use   Smoking status: Former    Packs/day: 1.50    Years: 30.00    Pack years: 45.00    Types: Cigarettes    Quit date: 08/08/2013    Years since quitting: 7.6   Smokeless tobacco: Never  Vaping Use   Vaping Use: Never used  Substance and Sexual Activity   Alcohol use: No   Drug use: Never   Sexual activity: Not Currently  Other Topics Concern   Not on file  Social History Narrative   Not on file   Social Determinants of Health   Financial Resource Strain: Not on file  Food Insecurity: Not on file  Transportation Needs: Not on file  Physical Activity: Not on file  Stress: Not on file  Social Connections: Not on file  Intimate Partner Violence: Not on file   Family Status  Relation Name Status   Mother  Alive   Father  Alive   Brother  Alive   Brother  Alive   Family History  Problem Relation Age of Onset   Hyperlipidemia Mother    Heart attack Father    Hyperlipidemia Father    Diabetes Father    Heart disease Father    Allergies  Allergen Reactions   Montelukast Hives    Singular  Depakote [Divalproex Sodium] Rash   Invokana [Canagliflozin] Other (See Comments)    Balanitis    Metformin And Related     Taken off in in early 2021 due to upset stomach.    Atorvastatin Other (See Comments)    Muscle pain, neck stiffness   Singulair [Montelukast Sodium] Hives    Patient Care Team: Birdie Sons, MD as PCP - General (Family Medicine) Kate Sable, MD as PCP - Cardiology (Cardiology) Lucilla Lame, MD as Consulting Physician (Gastroenterology) Caren Hazy, MD as Referring Physician (Internal Medicine) Pa, [place] (Optometry) Jeralene Huff, Marlou Sa, MD as Consulting Physician (Psychiatry) Pa, [facility]   Medications: Outpatient  Medications Prior to Visit  Medication Sig   albuterol (VENTOLIN HFA) 108 (90 Base) MCG/ACT inhaler Inhale 2 puffs into the lungs every 6 (six) hours as needed for wheezing or shortness of breath.   aspirin 81 MG tablet Take 81 mg by mouth daily.   Blood Glucose Monitoring Suppl (ONE TOUCH ULTRA 2) w/Device KIT USE AS DIRECTED   citalopram (CELEXA) 40 MG tablet Take 1 tablet (40 mg total) by mouth daily.   glipiZIDE (GLUCOTROL XL) 5 MG 24 hr tablet TAKE 1 TABLET BY MOUTH ONCE DAILY WITH BREAKFAST   glucose blood (CONTOUR TEST) test strip Check blood sugars 3 times daily. E11.65 (uncontrolled insulin dependent diabetes)   insulin detemir (LEVEMIR FLEXTOUCH) 100 UNIT/ML FlexPen Inject 30 Units into the skin daily.   insulin lispro (HUMALOG KWIKPEN) 200 UNIT/ML KwikPen Use before meals up to three times a day. Take 6 units if sugar is over 200. 8 units if over 250 and 10 units if over 300.   Insulin Pen Needle 32G X 4 MM MISC 1 each by Does not apply route 4 (four) times daily -  before meals and at bedtime.   lisinopril (ZESTRIL) 20 MG tablet Take 1 tablet (20 mg total) by mouth daily.   methocarbamol (ROBAXIN) 750 MG tablet Take 1 tablet (750 mg total) by mouth every 6 (six) hours as needed for muscle spasms (back pain).   metoCLOPramide (REGLAN) 10 MG tablet Take 1 tablet (10 mg total) by mouth 4 (four) times daily -  before meals and at bedtime.   nitroGLYCERIN (NITROSTAT) 0.4 MG SL tablet Place 1 tablet (0.4 mg total) under the tongue every 5 (five) minutes as needed for chest pain.   omeprazole (PRILOSEC) 40 MG capsule Take 1 capsule (40 mg total) by mouth daily.   oxyCODONE (ROXICODONE) 15 MG immediate release tablet Take 1 tablet (15 mg total) by mouth every 4 (four) hours as needed for pain.   pioglitazone (ACTOS) 30 MG tablet TAKE 1 TABLET BY MOUTH ONCE DAILY   polyethylene glycol-electrolytes (NULYTELY) 420 g solution Prepare according to package instructions. Starting at 5:00 PM: Drink one  8 oz glass of mixture every 15 minutes until you finish half of the jug. Five hours prior to procedure, drink 8 oz glass of mixture every 15 minutes until it is all gone. Make sure you do not drink anything 4 hours prior to your procedure. (Patient not taking: Reported on 02/02/2021)   RABEprazole (ACIPHEX) 20 MG tablet TAKE 1 TABLET BY MOUTH ONCE DAILY   rosuvastatin (CRESTOR) 40 MG tablet TAKE 1 TABLET BY MOUTH ONCE DAILY   Semaglutide,0.25 or 0.5MG/DOS, (OZEMPIC, 0.25 OR 0.5 MG/DOSE,) 2 MG/1.5ML SOPN Inject 0.25 mg as directed once a week.   traZODone (DESYREL) 100 MG tablet TAKE 2 TABLETS BY MOUTH AT BEDTIME  No facility-administered medications prior to visit.    Review of Systems  {Labs   Heme   Chem   Endocrine   Serology   Results Review (optional):23779}  Objective    There were no vitals taken for this visit. {Show previous vital signs (optional):23777}  Physical Exam  ***  Last depression screening scores PHQ 2/9 Scores 11/08/2020 05/10/2020 04/15/2019  PHQ - 2 Score 0 5 2  PHQ- 9 Score _0 Last fall risk screening Fall Risk  11/08/2020  Falls in the past year? 0  Number falls in past yr: 0  Injury with Fall? 0  Risk for fall due to : No Fall Risks  Follow up Falls evaluation completed   Last Audit-C alcohol use screening Alcohol Use Disorder Test (AUDIT) 11/08/2020  1. How often do you have a drink containing alcohol? 1  2. How many drinks containing alcohol do you have on a typical day when you are drinking? 0  3. How often do you have six or more drinks on one occasion? 0  AUDIT-C Score 1  Alcohol Brief Interventions/Follow-up -  Some encounter information is confidential and restricted. Go to Review Flowsheets activity to see all data.   A score of 3 or more in women, and 4 or more in men indicates increased risk for alcohol abuse, EXCEPT if all of the points are from question 1   No results found for any visits on 03/25/21.  Assessment & Plan    Routine  Health Maintenance and Physical Exam  Exercise Activities and Dietary recommendations  Goals   None     Immunization History  Administered Date(s) Administered   Hepatitis B 01/29/2009, 03/23/2009, 09/01/2009   Influenza, Quadrivalent, Recombinant, Inj, Pf 01/06/2019   Influenza,inj,Quad PF,6+ Mos 02/16/2016, 01/31/2017, 01/21/2018, 01/06/2020, 02/02/2021   Moderna Sars-Covid-2 Vaccination 06/07/2019, 07/05/2019, 04/19/2020   Pneumococcal Polysaccharide-23 04/19/2020   Tdap 09/01/2009   Zoster Recombinat (Shingrix) 04/19/2020    Health Maintenance  Topic Date Due   FOOT EXAM  Never done   Hepatitis C Screening  Never done   OPHTHALMOLOGY EXAM  06/06/2017   TETANUS/TDAP  09/02/2019   COVID-19 Vaccine (4 - Booster for Moderna series) 06/14/2020   Zoster Vaccines- Shingrix (2 of 2) 06/14/2020   Pneumococcal Vaccine 63-71 Years old (2 - PCV) 04/19/2021   URINE MICROALBUMIN  05/10/2021   HEMOGLOBIN A1C  07/25/2021   COLONOSCOPY (Pts 45-40yr Insurance coverage will need to be confirmed)  10/30/2023   INFLUENZA VACCINE  Completed   HIV Screening  Completed   HPV VACCINES  Aged Out    Discussed health benefits of physical activity, and encouraged him to engage in regular exercise appropriate for his age and condition.  ***  No follow-ups on file.     {provider attestation***:1}   DLelon Huh MD  [phone](phone) [phone](fax)  [place]

## 2021-04-06 ENCOUNTER — Other Ambulatory Visit: Payer: Self-pay | Admitting: Family Medicine

## 2021-04-06 DIAGNOSIS — M533 Sacrococcygeal disorders, not elsewhere classified: Secondary | ICD-10-CM

## 2021-04-06 DIAGNOSIS — R0789 Other chest pain: Secondary | ICD-10-CM

## 2021-04-06 DIAGNOSIS — M545 Low back pain, unspecified: Secondary | ICD-10-CM

## 2021-04-06 NOTE — Telephone Encounter (Signed)
Requested medication (s) are due for refill today: due 04/12/21  Requested medication (s) are on the active medication list: yes  Last refill:  03/12/21 #180 0 refills  Future visit scheduled: no  Notes to clinic:  not delegated per protocol, do you want to refill Rx?     Requested Prescriptions  Pending Prescriptions Disp Refills   oxyCODONE (ROXICODONE) 15 MG immediate release tablet 180 tablet 0    Sig: Take 1 tablet (15 mg total) by mouth every 4 (four) hours as needed for pain.     Not Delegated - Analgesics:  Opioid Agonists Failed - 04/06/2021 11:17 AM      Failed - This refill cannot be delegated      Failed - Urine Drug Screen completed in last 360 days      Passed - Valid encounter within last 6 months    Recent Outpatient Visits           2 months ago Diabetic ketoacidosis without coma associated with type 2 diabetes mellitus ([place])   [facility] Birdie Sons, MD   4 months ago Type 2 diabetes mellitus with other specified complication, without long-term current use of insulin [facility])   [facility] Birdie Sons, MD   9 months ago Type 2 diabetes mellitus with other specified complication, without long-term current use of insulin [facility])   [facility] Birdie Sons, MD   11 months ago Uncontrolled type 2 diabetes mellitus with hyperglycemia [facility])   [facility] Birdie Sons, MD   1 year ago Uncontrolled type 2 diabetes mellitus with hyperglycemia [facility])   [facility] Birdie Sons, MD       Future Appointments             In 2 weeks Jonathon Bellows, MD [facility]

## 2021-04-06 NOTE — Telephone Encounter (Signed)
Medication Refill - Medication: oxyCODONE (ROXICODONE) 15 MG immediate release tablet [[id number]]  Has the patient contacted their pharmacy? No. Stated pharmacy tells him to contact PCP (Agent: If no, request that the patient contact the pharmacy for the refill. If patient does not wish to contact the pharmacy document the reason why and proceed with request.)  Preferred Pharmacy (with phone number or street name):  [facility] [name], [place]  [place] [address]  Phone: [phone] Fax: [phone]  Hours: Not open 24 hours   Has the patient been seen for an appointment in the last year OR does the patient have an upcoming appointment? Yes.    Agent: Please be advised that RX refills may take up to 3 business days. We ask that you follow-up with your pharmacy.

## 2021-04-07 ENCOUNTER — Encounter: Payer: Self-pay | Admitting: *Deleted

## 2021-04-07 ENCOUNTER — Other Ambulatory Visit: Payer: Self-pay

## 2021-04-07 DIAGNOSIS — Z794 Long term (current) use of insulin: Secondary | ICD-10-CM | POA: Insufficient documentation

## 2021-04-07 DIAGNOSIS — Z955 Presence of coronary angioplasty implant and graft: Secondary | ICD-10-CM | POA: Insufficient documentation

## 2021-04-07 DIAGNOSIS — E1110 Type 2 diabetes mellitus with ketoacidosis without coma: Secondary | ICD-10-CM | POA: Insufficient documentation

## 2021-04-07 DIAGNOSIS — R109 Unspecified abdominal pain: Secondary | ICD-10-CM | POA: Diagnosis not present

## 2021-04-07 DIAGNOSIS — K2970 Gastritis, unspecified, without bleeding: Secondary | ICD-10-CM | POA: Insufficient documentation

## 2021-04-07 DIAGNOSIS — Z87891 Personal history of nicotine dependence: Secondary | ICD-10-CM | POA: Insufficient documentation

## 2021-04-07 DIAGNOSIS — Z7984 Long term (current) use of oral hypoglycemic drugs: Secondary | ICD-10-CM | POA: Diagnosis not present

## 2021-04-07 DIAGNOSIS — K760 Fatty (change of) liver, not elsewhere classified: Secondary | ICD-10-CM | POA: Diagnosis not present

## 2021-04-07 DIAGNOSIS — K2900 Acute gastritis without bleeding: Secondary | ICD-10-CM | POA: Diagnosis not present

## 2021-04-07 DIAGNOSIS — J45909 Unspecified asthma, uncomplicated: Secondary | ICD-10-CM | POA: Diagnosis not present

## 2021-04-07 DIAGNOSIS — I2510 Atherosclerotic heart disease of native coronary artery without angina pectoris: Secondary | ICD-10-CM | POA: Insufficient documentation

## 2021-04-07 DIAGNOSIS — R1010 Upper abdominal pain, unspecified: Secondary | ICD-10-CM | POA: Diagnosis not present

## 2021-04-07 DIAGNOSIS — Z7982 Long term (current) use of aspirin: Secondary | ICD-10-CM | POA: Diagnosis not present

## 2021-04-07 DIAGNOSIS — I700 Atherosclerosis of aorta: Secondary | ICD-10-CM | POA: Diagnosis not present

## 2021-04-07 DIAGNOSIS — R1111 Vomiting without nausea: Secondary | ICD-10-CM | POA: Diagnosis not present

## 2021-04-07 LAB — COMPREHENSIVE METABOLIC PANEL
ALT: 29 U/L (ref 0–44)
AST: 28 U/L (ref 15–41)
Albumin: 5.1 g/dL — ABNORMAL HIGH (ref 3.5–5.0)
Alkaline Phosphatase: 58 U/L (ref 38–126)
Anion gap: 12 (ref 5–15)
BUN: 18 mg/dL (ref 6–20)
CO2: 20 mmol/L — ABNORMAL LOW (ref 22–32)
Calcium: 9.6 mg/dL (ref 8.9–10.3)
Chloride: 100 mmol/L (ref 98–111)
Creatinine, Ser: 0.89 mg/dL (ref 0.61–1.24)
GFR, Estimated: >60 mL/min (ref >60)
Glucose, Bld: 190 mg/dL — ABNORMAL HIGH (ref 70–99)
Potassium: 3.8 mmol/L (ref 3.5–5.1)
Sodium: 132 mmol/L — ABNORMAL LOW (ref 135–145)
Total Bilirubin: 0.9 mg/dL (ref 0.3–1.2)
Total Protein: 8.2 g/dL — ABNORMAL HIGH (ref 6.5–8.1)

## 2021-04-07 LAB — CBC
HCT: 47.1 % (ref 39.0–52.0)
Hemoglobin: 16 g/dL (ref 13.0–17.0)
MCH: 27 pg (ref 26.0–34.0)
MCHC: 34 g/dL (ref 30.0–36.0)
MCV: 79.6 fL — ABNORMAL LOW (ref 80.0–100.0)
Platelets: 275 10*3/uL (ref 150–400)
RBC: 5.92 MIL/uL — ABNORMAL HIGH (ref 4.22–5.81)
RDW: 12.4 % (ref 11.5–15.5)
WBC: 15.8 10*3/uL — ABNORMAL HIGH (ref 4.0–10.5)
nRBC: 0 % (ref 0.0–0.2)

## 2021-04-07 LAB — LIPASE, BLOOD: Lipase: 30 U/L (ref 11–51)

## 2021-04-07 MED ORDER — IOHEXOL 9 MG/ML PO SOLN
500.0000 mL | ORAL | Status: AC
  Administered 2021-04-07: 500 mL via ORAL

## 2021-04-07 MED ORDER — OXYCODONE HCL 15 MG PO TABS: 15.0000 mg | ORAL_TABLET | ORAL | 0 refills | Status: DC | PRN

## 2021-04-07 NOTE — ED Triage Notes (Signed)
Pt to triage via wheelchair.  Pt has abd pain with vomiting.  Reports elevated blood sugar today.  Pt alert  speech clear.

## 2021-04-08 ENCOUNTER — Emergency Department: Payer: BC Managed Care – PPO

## 2021-04-08 ENCOUNTER — Emergency Department
Admission: EM | Admit: 2021-04-08 | Discharge: 2021-04-08 | Disposition: A | Discharge status: Home / Self Care | Payer: BC Managed Care – PPO | Attending: Emergency Medicine | Admitting: Emergency Medicine

## 2021-04-08 DIAGNOSIS — R109 Unspecified abdominal pain: Secondary | ICD-10-CM | POA: Diagnosis not present

## 2021-04-08 DIAGNOSIS — K760 Fatty (change of) liver, not elsewhere classified: Secondary | ICD-10-CM | POA: Diagnosis not present

## 2021-04-08 DIAGNOSIS — R1010 Upper abdominal pain, unspecified: Secondary | ICD-10-CM | POA: Diagnosis not present

## 2021-04-08 DIAGNOSIS — K2900 Acute gastritis without bleeding: Secondary | ICD-10-CM | POA: Diagnosis not present

## 2021-04-08 DIAGNOSIS — I700 Atherosclerosis of aorta: Secondary | ICD-10-CM | POA: Diagnosis not present

## 2021-04-08 DIAGNOSIS — R1111 Vomiting without nausea: Secondary | ICD-10-CM | POA: Diagnosis not present

## 2021-04-08 LAB — COMPREHENSIVE METABOLIC PANEL
ALT: 25 U/L (ref 0–44)
AST: 22 U/L (ref 15–41)
Albumin: 4.5 g/dL (ref 3.5–5.0)
Alkaline Phosphatase: 51 U/L (ref 38–126)
Anion gap: 9 (ref 5–15)
BUN: 16 mg/dL (ref 6–20)
CO2: 23 mmol/L (ref 22–32)
Calcium: 8.7 mg/dL — ABNORMAL LOW (ref 8.9–10.3)
Chloride: 99 mmol/L (ref 98–111)
Creatinine, Ser: 0.67 mg/dL (ref 0.61–1.24)
GFR, Estimated: >60 mL/min (ref >60)
Glucose, Bld: 223 mg/dL — ABNORMAL HIGH (ref 70–99)
Potassium: 4.1 mmol/L (ref 3.5–5.1)
Sodium: 131 mmol/L — ABNORMAL LOW (ref 135–145)
Total Bilirubin: 0.8 mg/dL (ref 0.3–1.2)
Total Protein: 7.5 g/dL (ref 6.5–8.1)

## 2021-04-08 LAB — URINALYSIS, ROUTINE W REFLEX MICROSCOPIC
Bacteria, UA: NONE SEEN
Bilirubin Urine: NEGATIVE
Glucose, UA: 150 mg/dL — AB
Ketones, ur: 20 mg/dL — AB
Leukocytes,Ua: NEGATIVE
Nitrite: NEGATIVE
Protein, ur: 100 mg/dL — AB
Specific Gravity, Urine: 1.027 (ref 1.005–1.030)
Squamous Epithelial / HPF: NONE SEEN (ref 0–5)
pH: 5 (ref 5.0–8.0)

## 2021-04-08 LAB — TROPONIN I (HIGH SENSITIVITY): Troponin I (High Sensitivity): 6 ng/L (ref <18)

## 2021-04-08 MED ORDER — ACETAMINOPHEN 325 MG PO TABS
ORAL_TABLET | ORAL | Status: AC
  Filled 2021-04-08: qty 2

## 2021-04-08 MED ORDER — LIDOCAINE VISCOUS HCL 2 % MT SOLN
15.0000 mL | Freq: Once | OROMUCOSAL | Status: AC
  Administered 2021-04-08: 09:00:00 15 mL via ORAL
  Filled 2021-04-08: qty 15

## 2021-04-08 MED ORDER — GI COCKTAIL ~~LOC~~: 30.0000 mL | Freq: Two times a day (BID) | ORAL | 0 refills | Status: DC | PRN

## 2021-04-08 MED ORDER — MORPHINE SULFATE (PF) 4 MG/ML IV SOLN
4.0000 mg | Freq: Once | INTRAVENOUS | Status: AC
  Administered 2021-04-08: 09:00:00 4 mg via INTRAVENOUS
  Filled 2021-04-08: qty 1

## 2021-04-08 MED ORDER — ACETAMINOPHEN 325 MG PO TABS
650.0000 mg | ORAL_TABLET | Freq: Once | ORAL | Status: AC
  Administered 2021-04-08: 11:00:00 650 mg via ORAL

## 2021-04-08 MED ORDER — SUCRALFATE 1 G PO TABS: 1.0000 g | ORAL_TABLET | Freq: Four times a day (QID) | ORAL | 0 refills | Status: DC

## 2021-04-08 MED ORDER — ALUM & MAG HYDROXIDE-SIMETH 200-200-20 MG/5ML PO SUSP
30.0000 mL | Freq: Once | ORAL | Status: AC
  Administered 2021-04-08: 09:00:00 30 mL via ORAL
  Filled 2021-04-08: qty 30

## 2021-04-08 MED ORDER — PANTOPRAZOLE SODIUM 40 MG PO TBEC: 40.0000 mg | DELAYED_RELEASE_TABLET | Freq: Every day | ORAL | 1 refills | Status: DC

## 2021-04-08 MED ORDER — ONDANSETRON HCL 4 MG/2ML IJ SOLN
4.0000 mg | Freq: Once | INTRAMUSCULAR | Status: AC
  Administered 2021-04-08: 09:00:00 4 mg via INTRAVENOUS
  Filled 2021-04-08: qty 2

## 2021-04-08 MED ORDER — SODIUM CHLORIDE 0.9 % IV BOLUS
1000.0000 mL | Freq: Once | INTRAVENOUS | Status: AC
  Administered 2021-04-08: 09:00:00 1000 mL via INTRAVENOUS

## 2021-04-08 NOTE — ED Provider Notes (Signed)
[facility] Emergency Department Provider Note  Time seen: 7:59 AM  I have reviewed the triage vital signs and the nursing notes.   HISTORY  Chief Complaint Abdominal Pain   HPI Vincent Hall is a 55 y.o. male with a past medical history of bipolar, diabetes, anxiety, gastritis, presents to the emergency department for abdominal pain.  According to the patient for the past several days patient has been experiencing upper abdominal pain that has progressively worsened.  Patient states nausea and vomiting.  Patient states an approximate 30-year history of a "gastritis", states he has seen GI doctors in the past including an endoscopy approximately 5 years ago per daughter.  Patient states his symptoms are usually controlled with a GI cocktail in the hospital as well as pain medication.  Patient denies any diarrhea.  Denies any dysuria.   Past Medical History:  Diagnosis Date   Anxiety    Asthma    Bipolar 2 disorder ([place])    Coronary artery disease    Depression    Diabetes ([place])    Fatty liver    History of MI (myocardial infarction)    STEMI (ST elevation myocardial infarction) ([place]) 06/22/2013    Patient Active Problem List   Diagnosis Date Noted   DKA (diabetic ketoacidosis) ([place]) 01/24/2021   Aortic atherosclerosis ([place]) 09/23/2020   Emphysema lung ([place]) 09/23/2020   OSA (obstructive sleep apnea) 06/23/2020   Vitamin D deficiency 05/11/2020   Narcotic dependence ([place]) 05/10/2020   Homicidal ideation 02/10/2020   Opioid abuse ([place]) 02/09/2020   PTSD (post-traumatic stress disorder) 02/09/2020   Nonallopathic lesion of sacral region 01/15/2020   Nonallopathic lesion of lumbar region 01/15/2020   Chronic bilateral low back pain with sciatica 09/05/2019   Stomach irritation    History of colonic polyps    Colonic edema    Internal hemorrhoids    Pulmonary nodule 10/03/2018   Type 2 diabetes mellitus with other specified complication ([place])  77/41/2878   Fatty liver 03/28/2018   Left hip pain 03/26/2018   Chronic left SI joint pain 03/26/2018   Hx of CABG (chronic thoracic/chest wall pain) 03/26/2018   Bipolar disorder ([place]) 01/25/2018   Suicide attempt ([place]) 01/08/2018   Benzodiazepine overdose 01/08/2018   Severe recurrent major depression without psychotic features ([place]) 01/08/2018   Chronic pain 01/08/2018   Thrush, oral 12/03/2017   Current moderate episode of major depressive disorder without prior episode ([place]) 07/26/2017   Anxiety 10/28/2014   Arthralgia 10/28/2014   Chest wall pain 10/28/2014   Dysphagia 10/28/2014   Fatigue 10/28/2014   History of tobacco use 10/28/2014   Hyperlipidemia, mixed 10/28/2014   Insomnia 10/28/2014   Chronic idiopathic constipation 09/18/2014   Esophageal reflux 09/18/2014   Esophageal spasm 09/18/2014   Coronary artery disease 07/07/2013   Allergic rhinitis 06/18/2008    Past Surgical History:  Procedure Laterality Date   APPENDECTOMY     CARDIAC CATHETERIZATION  06/24/2013   COLONOSCOPY WITH PROPOFOL N/A 09/23/2014   Procedure: COLONOSCOPY WITH PROPOFOL;  Surgeon: Lucilla Lame, MD;  Location: [place];  Service: Endoscopy;  Laterality: N/A;   COLONOSCOPY WITH PROPOFOL N/A 10/30/2018   Procedure: COLONOSCOPY WITH PROPOFOL;  Surgeon: Virgel Manifold, MD;  Location: [facility] ENDOSCOPY;  Service: Endoscopy;  Laterality: N/A;   CORONARY ANGIOPLASTY     stent placement to LAD   CORONARY ARTERY BYPASS GRAFT  07/31/2013   x 3 vessels   ESOPHAGOGASTRODUODENOSCOPY (EGD) WITH PROPOFOL N/A 10/30/2018  Procedure: ESOPHAGOGASTRODUODENOSCOPY (EGD) WITH PROPOFOL;  Surgeon: Virgel Manifold, MD;  Location: [facility] ENDOSCOPY;  Service: Endoscopy;  Laterality: N/A;   FINGER SURGERY     NASAL SINUS SURGERY     POLYPECTOMY  09/23/2014   Procedure: POLYPECTOMY;  Surgeon: Lucilla Lame, MD;  Location: [place];  Service: Endoscopy;;    Prior to Admission medications    Medication Sig Start Date End Date Taking? Authorizing Provider  albuterol (VENTOLIN HFA) 108 (90 Base) MCG/ACT inhaler Inhale 2 puffs into the lungs every 6 (six) hours as needed for wheezing or shortness of breath. 05/10/20   Birdie Sons, MD  aspirin 81 MG tablet Take 81 mg by mouth daily.    [provider]  Blood Glucose Monitoring Suppl (ONE TOUCH ULTRA 2) w/Device KIT USE AS DIRECTED 05/16/18   Birdie Sons, MD  citalopram (CELEXA) 40 MG tablet Take 1 tablet (40 mg total) by mouth daily. 05/10/20   Birdie Sons, MD  glipiZIDE (GLUCOTROL XL) 5 MG 24 hr tablet TAKE 1 TABLET BY MOUTH ONCE DAILY WITH BREAKFAST 03/14/21   Birdie Sons, MD  glucose blood (CONTOUR TEST) test strip Check blood sugars 3 times daily. E11.65 (uncontrolled insulin dependent diabetes) 02/02/21   Birdie Sons, MD  insulin detemir (LEVEMIR FLEXTOUCH) 100 UNIT/ML FlexPen Inject 30 Units into the skin daily. 03/12/21 04/11/21  Birdie Sons, MD  insulin lispro (HUMALOG KWIKPEN) 200 UNIT/ML KwikPen Use before meals up to three times a day. Take 6 units if sugar is over 200. 8 units if over 250 and 10 units if over 300. 04/16/20   Fisher, Kirstie Peri, MD  Insulin Pen Needle 32G X 4 MM MISC 1 each by Does not apply route 4 (four) times daily -  before meals and at bedtime. 01/26/21   Ralene Muskrat B, MD  lisinopril (ZESTRIL) 20 MG tablet Take 1 tablet (20 mg total) by mouth daily. 01/27/21 02/26/21  Sidney Ace, MD  methocarbamol (ROBAXIN) 750 MG tablet Take 1 tablet (750 mg total) by mouth every 6 (six) hours as needed for muscle spasms (back pain). 01/26/21   Sidney Ace, MD  metoCLOPramide (REGLAN) 10 MG tablet Take 1 tablet (10 mg total) by mouth 4 (four) times daily -  before meals and at bedtime. 01/26/21 02/25/21  Sidney Ace, MD  nitroGLYCERIN (NITROSTAT) 0.4 MG SL tablet Place 1 tablet (0.4 mg total) under the tongue every 5 (five) minutes as needed for chest pain. 05/10/20    Birdie Sons, MD  omeprazole (PRILOSEC) 40 MG capsule Take 1 capsule (40 mg total) by mouth daily. 11/27/20 02/01/21  Lavonia Drafts, MD  oxyCODONE (ROXICODONE) 15 MG immediate release tablet Take 1 tablet (15 mg total) by mouth every 4 (four) hours as needed for pain. 04/07/21   Birdie Sons, MD  pioglitazone (ACTOS) 30 MG tablet TAKE 1 TABLET BY MOUTH ONCE DAILY 03/14/21   Birdie Sons, MD  polyethylene glycol-electrolytes (NULYTELY) 420 g solution Prepare according to package instructions. Starting at 5:00 PM: Drink one 8 oz glass of mixture every 15 minutes until you finish half of the jug. Five hours prior to procedure, drink 8 oz glass of mixture every 15 minutes until it is all gone. Make sure you do not drink anything 4 hours prior to your procedure. Patient not taking: Reported on 02/02/2021 02/01/21   Jonathon Bellows, MD  RABEprazole (ACIPHEX) 20 MG tablet TAKE 1 TABLET BY MOUTH ONCE DAILY 02/08/21  Birdie Sons, MD  rosuvastatin (CRESTOR) 40 MG tablet TAKE 1 TABLET BY MOUTH ONCE DAILY 03/14/21   Birdie Sons, MD  Semaglutide,0.25 or 0.5MG/DOS, (OZEMPIC, 0.25 OR 0.5 MG/DOSE,) 2 MG/1.5ML SOPN Inject 0.25 mg as directed once a week. 05/10/20   Birdie Sons, MD  traZODone (DESYREL) 100 MG tablet TAKE 2 TABLETS BY MOUTH AT BEDTIME 12/27/20   Birdie Sons, MD    Allergies  Allergen Reactions   Montelukast Hives    Singular   Depakote [Divalproex Sodium] Rash   Invokana [Canagliflozin] Other (See Comments)    Balanitis    Metformin And Related     Taken off in in early 2021 due to upset stomach.    Atorvastatin Other (See Comments)    Muscle pain, neck stiffness   Singulair [Montelukast Sodium] Hives    Family History  Problem Relation Age of Onset   Hyperlipidemia Mother    Heart attack Father    Hyperlipidemia Father    Diabetes Father    Heart disease Father     Social History Social History   Tobacco Use   Smoking status: Former    Packs/day: 1.50     Years: 30.00    Pack years: 45.00    Types: Cigarettes    Quit date: 08/08/2013    Years since quitting: 7.6   Smokeless tobacco: Never  Vaping Use   Vaping Use: Never used  Substance Use Topics   Alcohol use: No   Drug use: Never    Review of Systems Constitutional: Negative for fever. Cardiovascular: Negative for chest pain. Respiratory: Negative for shortness of breath. Gastrointestinal: Significant upper abdominal pain/burning.  Positive for nausea and vomiting. Genitourinary: Negative for urinary compaints Musculoskeletal: Negative for musculoskeletal complaints Neurological: Negative for headache All other ROS negative  ____________________________________________   PHYSICAL EXAM:  VITAL SIGNS: ED Triage Vitals  Enc Vitals Group     BP 04/07/21 2227 (!) 176/78     Pulse Rate 04/07/21 2224 65     Resp 04/07/21 2224 18     Temp 04/07/21 2224 98 F (36.7 C)     Temp Source 04/07/21 2224 Oral     SpO2 04/07/21 2224 99 %     Weight 04/07/21 2225 210 lb (95.3 kg)     Height 04/07/21 2225 _0  (1.727 m)     Head Circumference --      Peak Flow --      Pain Score 04/07/21 2225 10     Pain Loc --      Pain Edu? --      Excl. in [place]? --    Constitutional: Alert and oriented. Well appearing and in no distress. Eyes: Normal exam ENT      Head: Normocephalic and atraumatic.      Mouth/Throat: Mucous membranes are moist. Cardiovascular: Normal rate, regular rhythm.  Respiratory: Normal respiratory effort without tachypnea nor retractions. Breath sounds are clear  Gastrointestinal: Soft, moderate diffuse abdominal tenderness to palpation.  No rebound guarding or distention. Musculoskeletal: Nontender with normal range of motion in all extremities.  Neurologic:  Normal speech and language. No gross focal neurologic deficits Skin:  Skin is warm, dry and intact.  Psychiatric: Mood and affect are normal.    ____________________________________________  RADIOLOGY  CT scan shows no acute abnormality.  ____________________________________________   INITIAL IMPRESSION / ASSESSMENT AND PLAN / ED COURSE  Pertinent labs & imaging results that were available during my care of the patient were  reviewed by me and considered in my medical decision making (see chart for details).   Patient presents emergency department for significant diffuse mostly upper abdominal pain.  Moderate tenderness to palpation across the upper abdomen.  CT scan is negative for acute abnormality.  Lab work is overall reassuring besides a moderate leukocytosis.  Patient has a history of gastritis to which his symptoms today sound identical.  Patient states he takes Pepcid 20 mg at home daily.  We will treat pain and nausea, IV hydrate and dose a GI cocktail.  We will reassess after medications.  We will place the patient on Protonix 40 mg daily as well as Carafate going home and have the patient follow-up with GI medicine.  Patient agreeable to plan of care.  Patient states he is feeling much better.  Repeat chemistry shows improvement in anion gap.  We will discharge the patient home with Carafate, Protonix, GI cocktail Rx and GI referral.  Patient agreeable to plan of care.  BREVYN RING was evaluated in Emergency Department on 04/08/2021 for the symptoms described in the history of present illness. He was evaluated in the context of the global COVID-19 pandemic, which necessitated consideration that the patient might be at risk for infection with the SARS-CoV-2 virus that causes COVID-19. Institutional protocols and algorithms that pertain to the evaluation of patients at risk for COVID-19 are in a state of rapid change based on information released by regulatory bodies including the CDC and federal and state organizations. These policies and algorithms were followed during the patient's care in the  ED.  ____________________________________________   FINAL CLINICAL IMPRESSION(S) / ED DIAGNOSES  Alberteen Spindle   Harvest Dark, MD 04/08/21 1047

## 2021-04-26 ENCOUNTER — Ambulatory Visit: Payer: BC Managed Care – PPO | Admitting: Gastroenterology

## 2021-04-26 ENCOUNTER — Other Ambulatory Visit: Payer: Self-pay

## 2021-05-09 ENCOUNTER — Other Ambulatory Visit: Payer: Self-pay | Admitting: Family Medicine

## 2021-05-09 DIAGNOSIS — G8929 Other chronic pain: Secondary | ICD-10-CM

## 2021-05-09 DIAGNOSIS — M533 Sacrococcygeal disorders, not elsewhere classified: Secondary | ICD-10-CM

## 2021-05-09 DIAGNOSIS — R0789 Other chest pain: Secondary | ICD-10-CM

## 2021-05-09 NOTE — Telephone Encounter (Signed)
Medication Refill - Medication:  oxyCODONE (ROXICODONE) 15 MG immediate release tablet   Has the patient contacted their pharmacy? Yes.   Contact PCP  Preferred Pharmacy (with phone number or street name):  [facility] [name] [place]  [place], [place] [address]  Phone:  [phone]  Fax:  [phone]  Has the patient been seen for an appointment in the last year OR does the patient have an upcoming appointment? Yes.    Agent: Please be advised that RX refills may take up to 3 business days. We ask that you follow-up with your pharmacy.

## 2021-05-09 NOTE — Telephone Encounter (Signed)
Requested medication (s) are due for refill today: yes  Requested medication (s) are on the active medication list: yes  Last refill:  04/07/21 #180/0  Future visit scheduled: no  Notes to clinic:  Unable to refill per protocol, cannot delegate.      Requested Prescriptions  Pending Prescriptions Disp Refills   oxyCODONE (ROXICODONE) 15 MG immediate release tablet 180 tablet 0    Sig: Take 1 tablet (15 mg total) by mouth every 4 (four) hours as needed for pain.     Not Delegated - Analgesics:  Opioid Agonists Failed - 05/09/2021 12:40 PM      Failed - This refill cannot be delegated      Failed - Urine Drug Screen completed in last 360 days      Passed - Valid encounter within last 6 months    Recent Outpatient Visits           3 months ago Diabetic ketoacidosis without coma associated with type 2 diabetes mellitus [facility])   [facility] Birdie Sons, MD   6 months ago Type 2 diabetes mellitus with other specified complication, without long-term current use of insulin [facility])   [facility] Birdie Sons, MD   10 months ago Type 2 diabetes mellitus with other specified complication, without long-term current use of insulin [facility])   [facility] Birdie Sons, MD   12 months ago Uncontrolled type 2 diabetes mellitus with hyperglycemia [facility])   [facility] Birdie Sons, MD   1 year ago Uncontrolled type 2 diabetes mellitus with hyperglycemia [facility])   [facility] Birdie Sons, MD

## 2021-05-10 MED ORDER — OXYCODONE HCL 15 MG PO TABS: 15.0000 mg | ORAL_TABLET | ORAL | 0 refills | Status: DC | PRN

## 2021-06-03 ENCOUNTER — Other Ambulatory Visit: Payer: Self-pay | Admitting: Family Medicine

## 2021-06-03 DIAGNOSIS — R0789 Other chest pain: Secondary | ICD-10-CM

## 2021-06-03 DIAGNOSIS — G8929 Other chronic pain: Secondary | ICD-10-CM

## 2021-06-03 NOTE — Telephone Encounter (Signed)
Medication: oxyCODONE (ROXICODONE) 15 MG immediate release tablet [[id number]]   Has the patient contacted their pharmacy? Yes advised to contact the office (Agent: If no, request that the patient contact the pharmacy for the refill. If patient does not wish to contact the pharmacy document the reason why and proceed with request.) (Agent: If yes, when and what did the pharmacy advise?)   Preferred Pharmacy (with phone number or street name): [facility] [name], [place] [address] Phone: [phone] Fax: [phone] Hours: Not open 24 hours Has the patient been seen for an appointment in the last year OR does the patient have an upcoming appointment? Yes   Agent: Please be advised that RX refills may take up to 3 business days. We ask that you follow-up with your pharmacy.

## 2021-06-03 NOTE — Telephone Encounter (Signed)
Requested medications are due for refill today.  yes  Requested medications are on the active medications list.  yes  Last refill. 05/10/2021 #180 0 refills  Future visit scheduled.   no  Notes to clinic.  Medication not delegated.    Requested Prescriptions  Pending Prescriptions Disp Refills   oxyCODONE (ROXICODONE) 15 MG immediate release tablet 180 tablet 0    Sig: Take 1 tablet (15 mg total) by mouth every 4 (four) hours as needed for pain.     Not Delegated - Analgesics:  Opioid Agonists Failed - 06/03/2021  1:02 PM      Failed - This refill cannot be delegated      Failed - Urine Drug Screen completed in last 360 days      Failed - Valid encounter within last 3 months    Recent Outpatient Visits           4 months ago Diabetic ketoacidosis without coma associated with type 2 diabetes mellitus [facility])   [facility] Birdie Sons, MD   6 months ago Type 2 diabetes mellitus with other specified complication, without long-term current use of insulin [facility])   [facility] Birdie Sons, MD   11 months ago Type 2 diabetes mellitus with other specified complication, without long-term current use of insulin [facility])   [facility] Birdie Sons, MD   1 year ago Uncontrolled type 2 diabetes mellitus with hyperglycemia [facility])   [facility] Birdie Sons, MD   1 year ago Uncontrolled type 2 diabetes mellitus with hyperglycemia [facility])   [facility] Birdie Sons, MD

## 2021-06-05 MED ORDER — OXYCODONE HCL 15 MG PO TABS: 15.0000 mg | ORAL_TABLET | ORAL | 0 refills | Status: DC | PRN

## 2021-06-09 ENCOUNTER — Other Ambulatory Visit: Payer: Self-pay | Admitting: Family Medicine

## 2021-06-09 DIAGNOSIS — E1169 Type 2 diabetes mellitus with other specified complication: Secondary | ICD-10-CM

## 2021-06-10 DIAGNOSIS — Z794 Long term (current) use of insulin: Secondary | ICD-10-CM | POA: Diagnosis not present

## 2021-06-10 DIAGNOSIS — Z87891 Personal history of nicotine dependence: Secondary | ICD-10-CM | POA: Diagnosis not present

## 2021-06-10 DIAGNOSIS — K920 Hematemesis: Secondary | ICD-10-CM | POA: Diagnosis not present

## 2021-06-10 DIAGNOSIS — R824 Acetonuria: Secondary | ICD-10-CM | POA: Diagnosis not present

## 2021-06-10 DIAGNOSIS — R7989 Other specified abnormal findings of blood chemistry: Secondary | ICD-10-CM | POA: Diagnosis not present

## 2021-06-10 DIAGNOSIS — E119 Type 2 diabetes mellitus without complications: Secondary | ICD-10-CM | POA: Diagnosis not present

## 2021-06-10 DIAGNOSIS — R112 Nausea with vomiting, unspecified: Secondary | ICD-10-CM | POA: Diagnosis not present

## 2021-06-10 DIAGNOSIS — K760 Fatty (change of) liver, not elsewhere classified: Secondary | ICD-10-CM | POA: Diagnosis not present

## 2021-06-10 DIAGNOSIS — K921 Melena: Secondary | ICD-10-CM | POA: Diagnosis not present

## 2021-06-10 DIAGNOSIS — R918 Other nonspecific abnormal finding of lung field: Secondary | ICD-10-CM | POA: Diagnosis not present

## 2021-06-10 DIAGNOSIS — R1013 Epigastric pain: Secondary | ICD-10-CM | POA: Diagnosis not present

## 2021-06-10 DIAGNOSIS — K625 Hemorrhage of anus and rectum: Secondary | ICD-10-CM | POA: Diagnosis not present

## 2021-06-10 LAB — HEPATIC FUNCTION PANEL
ALT: 58 U/L — AB (ref 10–40)
AST: 60 — AB (ref 14–40)
Alkaline Phosphatase: 63 (ref 25–125)
Bilirubin, Total: 0.5

## 2021-06-10 LAB — BASIC METABOLIC PANEL
BUN: 12 (ref 4–21)
CO2: 25 — AB (ref 13–22)
Chloride: 94 — AB (ref 99–108)
Creatinine: 0.7 (ref 0.6–1.3)
Glucose: 238
Potassium: 3.5 mEq/L (ref 3.5–5.1)
Sodium: 135 — AB (ref 137–147)

## 2021-06-10 LAB — COMPREHENSIVE METABOLIC PANEL
Calcium: 9.8 (ref 8.7–10.7)
eGFR: 107

## 2021-06-10 LAB — HEMOGLOBIN A1C: Hemoglobin A1C: 10.3

## 2021-06-13 ENCOUNTER — Other Ambulatory Visit: Payer: Self-pay

## 2021-06-13 ENCOUNTER — Ambulatory Visit (INDEPENDENT_AMBULATORY_CARE_PROVIDER_SITE_OTHER): Payer: BC Managed Care – PPO | Admitting: Family Medicine

## 2021-06-13 ENCOUNTER — Encounter: Payer: Self-pay | Admitting: Family Medicine

## 2021-06-13 VITALS — BP 116/55 | HR 68 | Wt 213.6 lb

## 2021-06-13 DIAGNOSIS — E1169 Type 2 diabetes mellitus with other specified complication: Secondary | ICD-10-CM

## 2021-06-13 DIAGNOSIS — E1165 Type 2 diabetes mellitus with hyperglycemia: Secondary | ICD-10-CM | POA: Diagnosis not present

## 2021-06-13 DIAGNOSIS — R109 Unspecified abdominal pain: Secondary | ICD-10-CM

## 2021-06-13 DIAGNOSIS — K219 Gastro-esophageal reflux disease without esophagitis: Secondary | ICD-10-CM | POA: Diagnosis not present

## 2021-06-13 MED ORDER — PANTOPRAZOLE SODIUM 40 MG PO TBEC: 40.0000 mg | DELAYED_RELEASE_TABLET | Freq: Every day | ORAL | 3 refills | Status: DC

## 2021-06-13 MED ORDER — PIOGLITAZONE HCL 30 MG PO TABS: 30.0000 mg | ORAL_TABLET | Freq: Every day | ORAL | 4 refills | Status: DC

## 2021-06-13 MED ORDER — OZEMPIC (0.25 OR 0.5 MG/DOSE) 2 MG/1.5ML ~~LOC~~ SOPN: 0.2500 mg | PEN_INJECTOR | SUBCUTANEOUS | 0 refills | Status: DC

## 2021-06-13 MED ORDER — LEVEMIR FLEXTOUCH 100 UNIT/ML ~~LOC~~ SOPN: 38.0000 [IU] | PEN_INJECTOR | Freq: Every day | SUBCUTANEOUS | 3 refills | Status: DC

## 2021-06-13 MED ORDER — NITROGLYCERIN 0.4 MG SL SUBL: 0.4000 mg | SUBLINGUAL_TABLET | SUBLINGUAL | 1 refills | Status: DC | PRN

## 2021-06-13 NOTE — Progress Notes (Signed)
?  ? ? ?Established patient visit ? ? ?Patient: Vincent Hall   DOB: 12/29/1965   56 y.o. Male  MRN: [id number] ?Visit Date: 06/13/2021 ? ?Today's healthcare provider: [name], MD  ? ?Chief Complaint  ?Patient presents with  ? ER followup  ? ?Subjective  ?  ?HPI  ?Follow up ER visit ? ?Patient was seen in ER for rectal bleeding on 06-10-21 ?He was treated for rectal bleeding.Marland Kitchen ?Treatment for this included lab tests  ?He reports fair compliance with treatment. ?He reports this condition is Improved. ? ?He also had generalized abdominal pains. He has had similar episodes in the past and is scheduled to see Dr. Vicente Males for follow up in April.  ? ?He was noted to have had uncontrolled a1c of 11.6 ?-----------------------------------------------------------------------------------------  ? ?Medications: ?Outpatient Medications Prior to Visit  ?Medication Sig  ? albuterol (VENTOLIN HFA) 108 (90 Base) MCG/ACT inhaler Inhale 2 puffs into the lungs every 6 (six) hours as needed for wheezing or shortness of breath.  ? aspirin 81 MG tablet Take 81 mg by mouth daily.  ? Blood Glucose Monitoring Suppl (ONE TOUCH ULTRA 2) w/Device KIT USE AS DIRECTED  ? citalopram (CELEXA) 40 MG tablet Take 1 tablet (40 mg total) by mouth daily.  ? glipiZIDE (GLUCOTROL XL) 5 MG 24 hr tablet TAKE 1 TABLET BY MOUTH ONCE DAILY WITH BREAKFAST  ? glucose blood (CONTOUR TEST) test strip Check blood sugars 3 times daily. E11.65 (uncontrolled insulin dependent diabetes)  ? insulin lispro (HUMALOG KWIKPEN) 200 UNIT/ML KwikPen Use before meals up to three times a day. Take 6 units if sugar is over 200. 8 units if over 250 and 10 units if over 300.  ? Insulin Pen Needle 32G X 4 MM MISC 1 each by Does not apply route 4 (four) times daily -  before meals and at bedtime.  ? methocarbamol (ROBAXIN) 750 MG tablet Take 1 tablet (750 mg total) by mouth every 6 (six) hours as needed for muscle spasms (back pain).  ? nitroGLYCERIN (NITROSTAT) 0.4 MG SL tablet  Place 1 tablet (0.4 mg total) under the tongue every 5 (five) minutes as needed for chest pain.  ? oxyCODONE (ROXICODONE) 15 MG immediate release tablet Take 1 tablet (15 mg total) by mouth every 4 (four) hours as needed for pain.  ? pantoprazole (PROTONIX) 40 MG tablet Take 1 tablet (40 mg total) by mouth daily.  ? pioglitazone (ACTOS) 30 MG tablet TAKE 1 TABLET BY MOUTH ONCE DAILY  ? polyethylene glycol-electrolytes (NULYTELY) 420 g solution Prepare according to package instructions. Starting at 5:00 PM: Drink one 8 oz glass of mixture every 15 minutes until you finish half of the jug. Five hours prior to procedure, drink 8 oz glass of mixture every 15 minutes until it is all gone. Make sure you do not drink anything 4 hours prior to your procedure.  ? RABEprazole (ACIPHEX) 20 MG tablet TAKE 1 TABLET BY MOUTH ONCE DAILY  ? rosuvastatin (CRESTOR) 40 MG tablet TAKE 1 TABLET BY MOUTH ONCE DAILY  ? Semaglutide,0.25 or 0.5MG/DOS, (OZEMPIC, 0.25 OR 0.5 MG/DOSE,) 2 MG/1.5ML SOPN Take 0.25 mg by mouth once a week.  ? sucralfate (CARAFATE) 1 g tablet Take 1 tablet (1 g total) by mouth 4 (four) times daily.  ? traZODone (DESYREL) 100 MG tablet TAKE 2 TABLETS BY MOUTH AT BEDTIME  ? Alum & Mag Hydroxide-Simeth (GI COCKTAIL) SUSP suspension Take 30 mLs by mouth 2 (two) times daily as needed for indigestion (abd  pain). Shake well. ?Each dose to containe 71m maalox and 123mviscous lidocaine (Patient not taking: Reported on 06/13/2021)  ? insulin detemir (LEVEMIR FLEXTOUCH) 100 UNIT/ML FlexPen Inject 30 Units into the skin daily.  ? lisinopril (ZESTRIL) 20 MG tablet Take 1 tablet (20 mg total) by mouth daily.  ? metoCLOPramide (REGLAN) 10 MG tablet Take 1 tablet (10 mg total) by mouth 4 (four) times daily -  before meals and at bedtime.  ? omeprazole (PRILOSEC) 40 MG capsule Take 1 capsule (40 mg total) by mouth daily.  ? ?No facility-administered medications prior to visit.  ? ? ? ? ? ?  Objective  ?  ?BP (!) 116/55 (BP  Location: Right Arm, Patient Position: Sitting, Cuff Size: Large)   Pulse 68   Wt 213 lb 9.6 oz (96.9 kg)   BMI 32.48 kg/m?  ? ? ?Physical Exam  ? ?General Appearance:    Mildly obese male, alert, cooperative, in no acute distress  ?Eyes:    PERRL, conjunctiva/corneas clear, EOM's intact       ?Lungs:     Clear to auscultation bilaterally, respirations unlabored  ?Heart:    Normal heart rate. Normal rhythm. No murmurs, rubs, or gallops.    ?Abdomen:   bowel sounds present and normal in all 4 quadrants. No CVA tenderness   ?   ?   ? Assessment & Plan  ?  ? ?1. Uncontrolled type 2 diabetes mellitus with hyperglycemia ([place]?Increase insulin detemir (LEVEMIR FLEXTOUCH) 100 UNIT/ML FlexPen; Inject 38 Units into the skin daily.  Dispense: 9 mL; Refill: 3 -  ?refill pioglitazone (ACTOS) 30 MG tablet; Take 1 tablet (30 mg total) by mouth daily.  Dispense: 90 tablet; Refill: 4 ? ?Put semaglutide on hold as below.  ? ?2 Intermittent abdominal pain ?Now resolved. Hold semaglutide as above. Follow up Dr. AnVicente Malesn April as scheduled.  ? ?3 Gastroesophageal reflux disease, unspecified whether esophagitis present ?Refill pantoprazole (PROTONIX) 40 MG tablet; Take 1 tablet (40 mg total) by mouth daily.  Dispense: 90 tablet; Refill: 3 ? ?Refill- nitroGLYCERIN (NITROSTAT) 0.4 MG SL tablet; Place 1 tablet (0.4 mg total) under the tongue every 5 (five) minutes as needed for chest pain.  Dispense: 20 tablet; Refill: 1  ? ?Future Appointments  ?Date Time Provider [place]?07/26/2021  1:15 PM AnJonathon BellowsMD [facility] None  ?08/01/2021  1:00 PM Elvyn Krohn, DoKirstie PeriMD [facility]  ?  ?   ? ?The entirety of the information documented in the History of Present Illness, Review of Systems and Physical Exam were personally obtained by me. Portions of this information were initially documented by the CMA and reviewed by me for thoroughness and accuracy.   ? ? ?DoLelon HuhMD  ?[phone](phone) ?[phone]) ? ?[facility]  ?

## 2021-07-01 ENCOUNTER — Other Ambulatory Visit: Payer: Self-pay | Admitting: Family Medicine

## 2021-07-01 DIAGNOSIS — M545 Low back pain, unspecified: Secondary | ICD-10-CM

## 2021-07-01 DIAGNOSIS — R0789 Other chest pain: Secondary | ICD-10-CM

## 2021-07-01 DIAGNOSIS — G8929 Other chronic pain: Secondary | ICD-10-CM

## 2021-07-01 NOTE — Telephone Encounter (Signed)
Copied from [place]. Topic: Quick Communication - Rx Refill/Question ?>> Jul 01, 2021  9:21 AM Leward Quan A wrote: ?Medication: oxyCODONE (ROXICODONE) 15 MG immediate release tablet  ? ?Has the patient contacted their pharmacy? No. Call office since its controlled  ?(Agent: If no, request that the patient contact the pharmacy for the refill. If patient does not wish to contact the pharmacy document the reason why and proceed with request.) ?(Agent: If yes, when and what did the pharmacy advise?) ? ?Preferred Pharmacy (with phone number or street name): [facility] [name], [place]  ?Phone:  [phone] ?Fax:  [phone] ? ? ? ?Has the patient been seen for an appointment in the last year OR does the patient have an upcoming appointment? Yes.   ? ?Agent: Please be advised that RX refills may take up to 3 business days. We ask that you follow-up with your pharmacy. ?

## 2021-07-04 NOTE — Telephone Encounter (Signed)
Pt is calling back to follow up. Pt stated he only has medication left for today.  ? ? ?Please advise.  ?

## 2021-07-04 NOTE — Telephone Encounter (Signed)
Requested medication (s) are due for refill today: Yes ? ?Requested medication (s) are on the active medication list: Yes ? ?Last refill:  06/05/21 ? ?Future visit scheduled: Yes ? ?Notes to clinic:  Unable to refill per protocol, cannot delegate. ? ? ? ? ? ?Requested Prescriptions  ?Pending Prescriptions Disp Refills  ? oxyCODONE (ROXICODONE) 15 MG immediate release tablet 180 tablet 0  ?  Sig: Take 1 tablet (15 mg total) by mouth every 4 (four) hours as needed for pain.  ?  ? Not Delegated - Analgesics:  Opioid Agonists Failed - 07/04/2021 10:23 PM  ?  ?  Failed - This refill cannot be delegated  ?  ?  Failed - Urine Drug Screen completed in last 360 days  ?  ?  Passed - Valid encounter within last 3 months  ?  Recent Outpatient Visits   ? ?      ? 3 weeks ago Intermittent abdominal pain  ? [facility] Birdie Sons, MD  ? 5 months ago Diabetic ketoacidosis without coma associated with type 2 diabetes mellitus ([place])  ? [facility] Birdie Sons, MD  ? 7 months ago Type 2 diabetes mellitus with other specified complication, without long-term current use of insulin ([place])  ? [facility] Birdie Sons, MD  ? 12 months ago Type 2 diabetes mellitus with other specified complication, without long-term current use of insulin ([place])  ? [facility] Birdie Sons, MD  ? 1 year ago Uncontrolled type 2 diabetes mellitus with hyperglycemia ([place])  ? [facility] Caryn Section, Kirstie Peri, MD  ? ?  ?  ?Future Appointments   ? ?        ? In 3 weeks Jonathon Bellows, MD [place]  ? In 4 weeks Fisher, Kirstie Peri, MD [facility]  ? ?  ? ?  ?  ?  ? ? ? ? ?

## 2021-07-05 ENCOUNTER — Telehealth: Payer: Self-pay | Admitting: Family Medicine

## 2021-07-05 MED ORDER — OXYCODONE HCL 15 MG PO TABS: 15.0000 mg | ORAL_TABLET | ORAL | 0 refills | Status: DC | PRN

## 2021-07-05 NOTE — Telephone Encounter (Signed)
Copied from [place] [phone]. Topic: General - Other ?>> Jul 05, 2021  5:00 PM Tessa Lerner A wrote: ?Reason for CRM: The patient would like to speak with a member of practice staff when possible ? ?The patient would like to know the reason for the delay in their refill of oxyCODONE (ROXICODONE) 15 MG immediate release tablet [[id number]]  ? ?Please contact further when possible ?

## 2021-07-05 NOTE — Telephone Encounter (Signed)
Prescription sent to [facility] 

## 2021-07-05 NOTE — Telephone Encounter (Signed)
Patient called wanting to know why there is a delay on this refill request.  ?

## 2021-07-06 NOTE — Telephone Encounter (Signed)
Patient advised.

## 2021-07-26 ENCOUNTER — Ambulatory Visit: Payer: BC Managed Care – PPO | Admitting: Gastroenterology

## 2021-08-01 ENCOUNTER — Ambulatory Visit: Payer: BC Managed Care – PPO | Admitting: Family Medicine

## 2021-08-02 ENCOUNTER — Ambulatory Visit (INDEPENDENT_AMBULATORY_CARE_PROVIDER_SITE_OTHER): Payer: BC Managed Care – PPO | Admitting: Family Medicine

## 2021-08-02 ENCOUNTER — Encounter: Payer: Self-pay | Admitting: Family Medicine

## 2021-08-02 VITALS — BP 113/69 | HR 79 | Temp 99.0°F | Resp 16 | Wt 205.3 lb

## 2021-08-02 DIAGNOSIS — Z23 Encounter for immunization: Secondary | ICD-10-CM

## 2021-08-02 DIAGNOSIS — M533 Sacrococcygeal disorders, not elsewhere classified: Secondary | ICD-10-CM

## 2021-08-02 DIAGNOSIS — M545 Low back pain, unspecified: Secondary | ICD-10-CM | POA: Diagnosis not present

## 2021-08-02 DIAGNOSIS — E1165 Type 2 diabetes mellitus with hyperglycemia: Secondary | ICD-10-CM

## 2021-08-02 DIAGNOSIS — G8929 Other chronic pain: Secondary | ICD-10-CM

## 2021-08-02 DIAGNOSIS — Z125 Encounter for screening for malignant neoplasm of prostate: Secondary | ICD-10-CM

## 2021-08-02 DIAGNOSIS — R0789 Other chest pain: Secondary | ICD-10-CM | POA: Diagnosis not present

## 2021-08-02 MED ORDER — OXYCODONE HCL 15 MG PO TABS: 15.0000 mg | ORAL_TABLET | ORAL | 0 refills | Status: DC | PRN

## 2021-08-02 NOTE — Progress Notes (Signed)
?  ? ?I,Sulibeya S Dimas,acting as a scribe for [name], MD.,have documented all relevant documentation on the behalf of [name], MD,as directed by  [name], MD while in the presence of [name], MD. ? ? ?Established patient visit ? ? ?Patient: Vincent Hall   DOB: [birth year]   56 y.o. Male  MRN: [id number] ?Visit Date: 08/02/2021 ? ?Today's healthcare provider: [name], MD  ? ?Chief Complaint  ?Patient presents with  ? Diabetes  ? ?Subjective  ?  ?HPI  ?Follow up for uncontrolled diabetes ? ?The patient was last seen for this 1 months ago. ?Changes made at last visit include increase insulin to 38 units daily continue Actos 71m daily, and Humalog before meals, hold semaglutide due to abdominal pains, which he states today is much better.  ? ?He reports excellent compliance with treatment. ?He feels that condition is Improved. Has been much more strict with diet. Patient reports FBS 120-130s at home and rarely requires short acting insulin.  ?He is not having side effects.  ? ?Lab Results  ?Component Value Date  ? HGBA1C 10.3 06/10/2021  ?----------------------------------------------------------------------------------------- ?Patient requesting refill on oxycodone. Patient reports good tolerance and compliance with medication.  ? ? ?Medications: ?Outpatient Medications Prior to Visit  ?Medication Sig  ? albuterol (VENTOLIN HFA) 108 (90 Base) MCG/ACT inhaler Inhale 2 puffs into the lungs every 6 (six) hours as needed for wheezing or shortness of breath.  ? Alum & Mag Hydroxide-Simeth (GI COCKTAIL) SUSP suspension Take 30 mLs by mouth 2 (two) times daily as needed for indigestion (abd pain). Shake well. ?Each dose to containe 119mmaalox and 1534miscous lidocaine  ? aspirin 81 MG tablet Take 81 mg by mouth daily.  ? Blood Glucose Monitoring Suppl (ONE TOUCH ULTRA 2) w/Device KIT USE AS DIRECTED  ? citalopram (CELEXA) 40 MG tablet Take 1 tablet (40 mg total) by mouth daily.  ?  glipiZIDE (GLUCOTROL XL) 5 MG 24 hr tablet TAKE 1 TABLET BY MOUTH ONCE DAILY WITH BREAKFAST  ? glucose blood (CONTOUR TEST) test strip Check blood sugars 3 times daily. E11.65 (uncontrolled insulin dependent diabetes)  ? insulin detemir (LEVEMIR FLEXTOUCH) 100 UNIT/ML FlexPen Inject 38 Units into the skin daily.  ? insulin lispro (HUMALOG KWIKPEN) 200 UNIT/ML KwikPen Use before meals up to three times a day. Take 6 units if sugar is over 200. 8 units if over 250 and 10 units if over 300.  ? Insulin Pen Needle 32G X 4 MM MISC 1 each by Does not apply route 4 (four) times daily -  before meals and at bedtime.  ? methocarbamol (ROBAXIN) 750 MG tablet Take 1 tablet (750 mg total) by mouth every 6 (six) hours as needed for muscle spasms (back pain).  ? nitroGLYCERIN (NITROSTAT) 0.4 MG SL tablet Place 1 tablet (0.4 mg total) under the tongue every 5 (five) minutes as needed for chest pain.  ? oxyCODONE (ROXICODONE) 15 MG immediate release tablet Take 1 tablet (15 mg total) by mouth every 4 (four) hours as needed for pain.  ? pantoprazole (PROTONIX) 40 MG tablet Take 1 tablet (40 mg total) by mouth daily.  ? pioglitazone (ACTOS) 30 MG tablet Take 1 tablet (30 mg total) by mouth daily.  ? polyethylene glycol-electrolytes (NULYTELY) 420 g solution Prepare according to package instructions. Starting at 5:00 PM: Drink one 8 oz glass of mixture every 15 minutes until you finish half of the jug. Five hours prior to procedure, drink 8 oz glass of  mixture every 15 minutes until it is all gone. Make sure you do not drink anything 4 hours prior to your procedure.  ? rosuvastatin (CRESTOR) 40 MG tablet TAKE 1 TABLET BY MOUTH ONCE DAILY  ? Semaglutide,0.25 or 0.5MG/DOS, (OZEMPIC, 0.25 OR 0.5 MG/DOSE,) 2 MG/1.5ML SOPN Take 0.25 mg by mouth once a week. PUT ON HOLD FOR NOW DUE TO GI SYMPTOMS  ? sucralfate (CARAFATE) 1 g tablet Take 1 tablet (1 g total) by mouth 4 (four) times daily.  ? traZODone (DESYREL) 100 MG tablet TAKE 2 TABLETS BY  MOUTH AT BEDTIME  ? lisinopril (ZESTRIL) 20 MG tablet Take 1 tablet (20 mg total) by mouth daily.  ? metoCLOPramide (REGLAN) 10 MG tablet Take 1 tablet (10 mg total) by mouth 4 (four) times daily -  before meals and at bedtime.  ? ?No facility-administered medications prior to visit.  ? ? ?Review of Systems  ?Constitutional:  Negative for appetite change and fatigue.  ?Respiratory:  Negative for shortness of breath.   ?Cardiovascular:  Negative for chest pain and palpitations.  ?Musculoskeletal:  Positive for back pain.  ? ?  ?  Objective  ?  ?BP 113/69 (BP Location: Left Arm, Patient Position: Sitting, Cuff Size: Large)   Pulse 79   Temp 99 ?F (37.2 ?C) (Oral)   Resp 16   Wt 205 lb 4.8 oz (93.1 kg)   BMI 31.22 kg/m?  ?BP Readings from Last 3 Encounters:  ?08/02/21 113/69  ?06/13/21 (!) 116/55  ?04/08/21 (!) 165/79  ? ?Wt Readings from Last 3 Encounters:  ?08/02/21 205 lb 4.8 oz (93.1 kg)  ?06/13/21 213 lb 9.6 oz (96.9 kg)  ?04/07/21 210 lb (95.3 kg)  ? ?  ? ?Physical Exam  ?General appearance: Mildly obese male, cooperative and in no acute distress ?Head: Normocephalic, without obvious abnormality, atraumatic ?Respiratory: Respirations even and unlabored, normal respiratory rate ?Extremities: All extremities are intact.  ?Skin: Skin color, texture, turgor normal. No rashes seen  ?Psych: Appropriate mood and affect. ?Neurologic: Mental status: Alert, oriented to person, place, and time, thought content appropriate.  ? ? ? Assessment & Plan  ?  ? ?1. Uncontrolled type 2 diabetes mellitus with hyperglycemia ([place]) ?Feels much better since stopping semaglutide. Is doing much better with diet and exercising regularly.  ?- Microalbumin / creatinine urine ratio ?- Hemoglobin A1c ?- Lipid panel ? ?2. Need for Tdap vaccination ? ?- Tdap vaccine greater than or equal to 7yo IM ? ?3. Need for shingles vaccine ? ?- Varicella-zoster vaccine IM ? ?4. Chronic midline low back pain without sciatica ? ?5. Chest wall  pain ?refill oxyCODONE (ROXICODONE) 15 MG immediate release tablet; Take 1 tablet (15 mg total) by mouth every 4 (four) hours as needed for pain.  Dispense: 180 tablet; Refill: 0 ? ?6. Prostate cancer screening ? ?- PSA ? ? ?   ? ?The entirety of the information documented in the History of Present Illness, Review of Systems and Physical Exam were personally obtained by me. Portions of this information were initially documented by the CMA and reviewed by me for thoroughness and accuracy.   ? ? ?[name], MD  ?[facility] ?[phone] (phone) ?[phone] (fax) ? ?[facility] ?

## 2021-08-03 ENCOUNTER — Other Ambulatory Visit: Payer: Self-pay | Admitting: Family Medicine

## 2021-08-03 LAB — MICROALBUMIN / CREATININE URINE RATIO
Creatinine, Urine: 43.4 mg/dL
Microalb/Creat Ratio: <7 mg/g creat (ref 0–29)
Microalbumin, Urine: <3 ug/mL

## 2021-08-03 LAB — PSA: Prostate Specific Ag, Serum: 0.1 ng/mL (ref 0.0–4.0)

## 2021-08-03 LAB — LIPID PANEL
Chol/HDL Ratio: 2.9 ratio (ref 0.0–5.0)
Cholesterol, Total: 151 mg/dL (ref 100–199)
HDL: 52 mg/dL (ref >39)
LDL Chol Calc (NIH): 70 mg/dL (ref 0–99)
Triglycerides: 176 mg/dL — ABNORMAL HIGH (ref 0–149)
VLDL Cholesterol Cal: 29 mg/dL (ref 5–40)

## 2021-08-03 LAB — HEMOGLOBIN A1C
Est. average glucose Bld gHb Est-mCnc: 303 mg/dL
Hgb A1c MFr Bld: 12.2 % — ABNORMAL HIGH (ref 4.8–5.6)

## 2021-08-04 ENCOUNTER — Other Ambulatory Visit: Payer: Self-pay

## 2021-08-04 DIAGNOSIS — E1169 Type 2 diabetes mellitus with other specified complication: Secondary | ICD-10-CM

## 2021-08-04 MED ORDER — LEVEMIR FLEXTOUCH 100 UNIT/ML ~~LOC~~ SOPN: 48.0000 [IU] | PEN_INJECTOR | Freq: Every day | SUBCUTANEOUS | 3 refills | Status: DC

## 2021-08-08 ENCOUNTER — Encounter: Payer: Self-pay | Admitting: Family Medicine

## 2021-08-12 ENCOUNTER — Telehealth: Payer: Self-pay | Admitting: Acute Care

## 2021-08-12 NOTE — Telephone Encounter (Signed)
Attempted to reach pt to schedule annual LDCT-LVMM 

## 2021-08-28 ENCOUNTER — Other Ambulatory Visit: Payer: Self-pay | Admitting: Family Medicine

## 2021-08-28 DIAGNOSIS — F321 Major depressive disorder, single episode, moderate: Secondary | ICD-10-CM

## 2021-08-28 DIAGNOSIS — J301 Allergic rhinitis due to pollen: Secondary | ICD-10-CM

## 2021-08-29 ENCOUNTER — Other Ambulatory Visit: Payer: Self-pay | Admitting: Family Medicine

## 2021-08-29 DIAGNOSIS — G8929 Other chronic pain: Secondary | ICD-10-CM

## 2021-08-29 DIAGNOSIS — R0789 Other chest pain: Secondary | ICD-10-CM

## 2021-08-29 NOTE — Telephone Encounter (Signed)
mometasone-furoate 2% cream is not on current med list.

## 2021-08-29 NOTE — Telephone Encounter (Signed)
Medication Refill - Medication: oxyCODONE (ROXICODONE) 15 MG immediate release tablet and mometasone-furoate 2% cream  Has the patient contacted their pharmacy? Yes.   Pt told to contact provider   Preferred Pharmacy (with phone number or street name):  [facility] [name], [place] Phone:  [phone]  Fax:  [phone]     Has the patient been seen for an appointment in the last year OR does the patient have an upcoming appointment? Yes.    Agent: Please be advised that RX refills may take up to 3 business days. We ask that you follow-up with your pharmacy.

## 2021-08-30 NOTE — Telephone Encounter (Signed)
Requested medication (s) are due for refill today - yes  Requested medication (s) are on the active medication list -yes  Future visit scheduled -yes  Last refill: 08/02/21 #180  Notes to clinic: non delegated Rx, request medication not on current list:mometasone-furoate 2% cream  Requested Prescriptions  Pending Prescriptions Disp Refills   oxyCODONE (ROXICODONE) 15 MG immediate release tablet 180 tablet 0    Sig: Take 1 tablet (15 mg total) by mouth every 4 (four) hours as needed for pain.     Not Delegated - Analgesics:  Opioid Agonists Failed - 08/29/2021 10:44 AM      Failed - This refill cannot be delegated      Failed - Urine Drug Screen completed in last 360 days      Passed - Valid encounter within last 3 months    Recent Outpatient Visits           4 weeks ago Uncontrolled type 2 diabetes mellitus with hyperglycemia [facility])   [facility] Birdie Sons, MD   2 months ago Intermittent abdominal pain   [facility] Birdie Sons, MD   6 months ago Diabetic ketoacidosis without coma associated with type 2 diabetes mellitus [facility])   [facility] Birdie Sons, MD   9 months ago Type 2 diabetes mellitus with other specified complication, without long-term current use of insulin ([place])   [facility] Birdie Sons, MD   1 year ago Type 2 diabetes mellitus with other specified complication, without long-term current use of insulin [facility])   [facility] Birdie Sons, MD       Future Appointments             In 3 weeks Fisher, Kirstie Peri, MD [facility]                Requested Prescriptions  Pending Prescriptions Disp Refills   oxyCODONE (ROXICODONE) 15 MG immediate release tablet 180 tablet 0    Sig: Take 1 tablet (15 mg total) by mouth every 4 (four) hours as needed for pain.     Not Delegated - Analgesics:  Opioid Agonists Failed - 08/29/2021 10:44 AM       Failed - This refill cannot be delegated      Failed - Urine Drug Screen completed in last 360 days      Passed - Valid encounter within last 3 months    Recent Outpatient Visits           4 weeks ago Uncontrolled type 2 diabetes mellitus with hyperglycemia [facility])   [facility] Birdie Sons, MD   2 months ago Intermittent abdominal pain   [facility] Birdie Sons, MD   6 months ago Diabetic ketoacidosis without coma associated with type 2 diabetes mellitus [facility])   [facility] Birdie Sons, MD   9 months ago Type 2 diabetes mellitus with other specified complication, without long-term current use of insulin [facility])   [facility] Birdie Sons, MD   1 year ago Type 2 diabetes mellitus with other specified complication, without long-term current use of insulin [facility])   [facility] Birdie Sons, MD       Future Appointments             In 3 weeks Fisher, Kirstie Peri, MD [facility], [facility]

## 2021-08-31 MED ORDER — OXYCODONE HCL 15 MG PO TABS: 15.0000 mg | ORAL_TABLET | ORAL | 0 refills | Status: DC | PRN

## 2021-09-21 NOTE — Progress Notes (Signed)
I,[name],acting as a scribe for [name], MD.,have documented all relevant documentation on the behalf of [name], MD,as directed by  [name], MD while in the presence of [name], MD.   Established patient visit   Patient: Vincent Hall   DOB: 01-29-1966   56 y.o. Male  MRN: [id number] Visit Date: 09/23/2021  Today's healthcare provider: [name], MD   Chief Complaint  Patient presents with   Diabetes   Subjective    HPI  Diabetes Mellitus Type II, Follow-up  Lab Results  Component Value Date   HGBA1C 12.2 (H) 08/02/2021   HGBA1C 10.3 06/10/2021   HGBA1C 11.6 (H) 01/24/2021   Wt Readings from Last 3 Encounters:  09/23/21 211 lb (95.7 kg)  08/02/21 205 lb 4.8 oz (93.1 kg)  06/13/21 213 lb 9.6 oz (96.9 kg)   Last seen for diabetes 6 weeks ago.  Management since then includes need increasing dose of the Levemir from 38 units to 48 units once a day and advising patient to follow up in 6 weeks.  He reports good compliance with treatment. He is not having side effects.  Symptoms: No fatigue No foot ulcerations  No appetite changes No nausea  No paresthesia of the feet  No polydipsia  No polyuria No visual disturbances   No vomiting     Home blood sugar records: fasting range: 300's  Episodes of hypoglycemia? No    Current insulin regiment: LEVEMIR FLEXTOUCH) 100 UNIT/ML FlexPen-Inject 48 Units into the skin daily. Also Humalog sliding scale.  Most Recent Eye Exam: 02/01/2021 Current exercise: none Current diet habits: well balanced  Pertinent Labs: Lab Results  Component Value Date   CHOL 151 08/02/2021   HDL 52 08/02/2021   LDLCALC 70 08/02/2021   TRIG 176 (H) 08/02/2021   CHOLHDL 2.9 08/02/2021   Lab Results  Component Value Date   NA 135 (A) 06/10/2021   K 3.5 06/10/2021   CREATININE 0.7 06/10/2021   EGFR 107 06/10/2021   MICROALBUR 20 05/10/2020   LABMICR <3.0 08/02/2021      ---------------------------------------------------------------------------------------------------   Medications: Outpatient Medications Prior to Visit  Medication Sig   albuterol (VENTOLIN HFA) 108 (90 Base) MCG/ACT inhaler Inhale 2 puffs into the lungs every 6 (six) hours as needed for wheezing or shortness of breath.   Alum & Mag Hydroxide-Simeth (GI COCKTAIL) SUSP suspension Take 30 mLs by mouth 2 (two) times daily as needed for indigestion (abd pain). Shake well. Each dose to containe 23m maalox and 148mviscous lidocaine   aspirin 81 MG tablet Take 81 mg by mouth daily.   azelastine (OPTIVAR) 0.05 % ophthalmic solution INSTILL ONE DROP TO THE AFFECTED EYE(S) TWICE A DAY   Blood Glucose Monitoring Suppl (ONE TOUCH ULTRA 2) w/Device KIT USE AS DIRECTED   citalopram (CELEXA) 40 MG tablet TAKE ONE TABLET BY MOUTH DAILY   cyclobenzaprine (FLEXERIL) 5 MG tablet TAKE 1 TABLET BY MOUTH 3 TIMES DAILY AS NEEDED MUSCLE SPASMS   glipiZIDE (GLUCOTROL XL) 5 MG 24 hr tablet TAKE 1 TABLET BY MOUTH ONCE DAILY WITH BREAKFAST   glucose blood (CONTOUR TEST) test strip Check blood sugars 3 times daily. E11.65 (uncontrolled insulin dependent diabetes)   insulin detemir (LEVEMIR FLEXTOUCH) 100 UNIT/ML FlexPen Inject 48 Units into the skin daily.   insulin lispro (HUMALOG KWIKPEN) 200 UNIT/ML KwikPen Use before meals up to three times a day. Take 6 units if sugar is over 200. 8 units if over  250 and 10 units if over 300.   Insulin Pen Needle 32G X 4 MM MISC 1 each by Does not apply route 4 (four) times daily -  before meals and at bedtime.   methocarbamol (ROBAXIN) 750 MG tablet Take 1 tablet (750 mg total) by mouth every 6 (six) hours as needed for muscle spasms (back pain).   nitroGLYCERIN (NITROSTAT) 0.4 MG SL tablet Place 1 tablet (0.4 mg total) under the tongue every 5 (five) minutes as needed for chest pain.   oxyCODONE (ROXICODONE) 15 MG immediate release tablet Take 1 tablet (15 mg total) by mouth  every 4 (four) hours as needed for pain.   pantoprazole (PROTONIX) 40 MG tablet Take 1 tablet (40 mg total) by mouth daily.   pioglitazone (ACTOS) 30 MG tablet Take 1 tablet (30 mg total) by mouth daily.   polyethylene glycol-electrolytes (NULYTELY) 420 g solution Prepare according to package instructions. Starting at 5:00 PM: Drink one 8 oz glass of mixture every 15 minutes until you finish half of the jug. Five hours prior to procedure, drink 8 oz glass of mixture every 15 minutes until it is all gone. Make sure you do not drink anything 4 hours prior to your procedure.   rosuvastatin (CRESTOR) 40 MG tablet TAKE 1 TABLET BY MOUTH ONCE DAILY   sucralfate (CARAFATE) 1 g tablet Take 1 tablet (1 g total) by mouth 4 (four) times daily.   traZODone (DESYREL) 100 MG tablet TAKE 2 TABLETS BY MOUTH AT BEDTIME   lisinopril (ZESTRIL) 20 MG tablet Take 1 tablet (20 mg total) by mouth daily.   metoCLOPramide (REGLAN) 10 MG tablet Take 1 tablet (10 mg total) by mouth 4 (four) times daily -  before meals and at bedtime.   No facility-administered medications prior to visit.    Review of Systems  Constitutional:  Positive for fatigue. Negative for appetite change, chills and fever.  Eyes:  Positive for visual disturbance.  Respiratory:  Negative for chest tightness, shortness of breath and wheezing.   Cardiovascular:  Negative for chest pain and palpitations.  Gastrointestinal:  Negative for abdominal pain, nausea and vomiting.  Endocrine: Positive for polydipsia and polyuria.  Musculoskeletal:  Positive for back pain (upper back pain).  Skin:  Positive for rash.       Objective    BP 114/73 (BP Location: Right Arm, Patient Position: Sitting, Cuff Size: Large)   Pulse 68   Temp 99.6 F (37.6 C) (Oral)   Resp 16   Wt 211 lb (95.7 kg)   SpO2 100% Comment: room air  BMI 32.08 kg/m    Physical Exam   General: Appearance:    Mildly obese male in no acute distress  Eyes:    PERRL,  conjunctiva/corneas clear, EOM's intact       Lungs:     Clear to auscultation bilaterally, respirations unlabored  Heart:    Normal heart rate. Normal rhythm. No murmurs, rubs, or gallops.    MS:   All extremities are intact.    Neurologic:   Awake, alert, oriented x 3. No apparent focal neurological defect.         Results for orders placed or performed in visit on 09/23/21  POCT glycosylated hemoglobin (Hb A1C)  Result Value Ref Range   Hemoglobin A1C 13.6 (A) 4.0 - 5.6 %   Est. average glucose Bld gHb Est-mCnc >326     Assessment & Plan     1. Uncontrolled type 2 diabetes mellitus with hyperglycemia (  [place]) Intolerant to oral and injectable semaglutide due to constipation and nausea Try samples  tirzepatide ([name]) 2.5 MG/0.5ML Pen; Inject 2.5 mg into the skin once a week.  Dispense: 2 mL; Refill: 0  refill insulin lispro (HUMALOG KWIKPEN) 200 UNIT/ML KwikPen; Use before meals up to three times a day. Take 6 units if sugar is over 200. 8 units if over 250 and 10 units if over 300.  Dispense: 3 mL; Refill: 2   2. Chronic midline low back pain without sciatica Fairly well controlled, continue current medications.   3. 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 Chronic left SI joint pain  5. Dermatitis - triamcinolone cream (KENALOG) 0.1 %; Apply 1 Application topically 2 (two) times daily.  Dispense: 30 g; Refill: 2  Future Appointments  Date Time Provider [place]  11/25/2021  3:40 PM Caryn Section, Kirstie Peri, MD [id number]        The entirety of the information documented in the History of Present Illness, Review of Systems and Physical Exam were personally obtained by me. Portions of this information were initially documented by the CMA and reviewed by me for thoroughness and accuracy.     [name], MD  [facility] [phone] (phone) [phone] (fax)  [place]

## 2021-09-23 ENCOUNTER — Encounter: Payer: Self-pay | Admitting: Family Medicine

## 2021-09-23 ENCOUNTER — Ambulatory Visit (INDEPENDENT_AMBULATORY_CARE_PROVIDER_SITE_OTHER): Payer: BC Managed Care – PPO | Admitting: Family Medicine

## 2021-09-23 VITALS — BP 114/73 | HR 68 | Temp 99.6°F | Resp 16 | Wt 211.0 lb

## 2021-09-23 DIAGNOSIS — M533 Sacrococcygeal disorders, not elsewhere classified: Secondary | ICD-10-CM

## 2021-09-23 DIAGNOSIS — G8929 Other chronic pain: Secondary | ICD-10-CM

## 2021-09-23 DIAGNOSIS — R0789 Other chest pain: Secondary | ICD-10-CM | POA: Diagnosis not present

## 2021-09-23 DIAGNOSIS — E1169 Type 2 diabetes mellitus with other specified complication: Secondary | ICD-10-CM

## 2021-09-23 DIAGNOSIS — E1165 Type 2 diabetes mellitus with hyperglycemia: Secondary | ICD-10-CM

## 2021-09-23 DIAGNOSIS — M545 Low back pain, unspecified: Secondary | ICD-10-CM | POA: Diagnosis not present

## 2021-09-23 LAB — POCT GLYCOSYLATED HEMOGLOBIN (HGB A1C)
Est. average glucose Bld gHb Est-mCnc: >326
Hemoglobin A1C: 13.6 % — AB (ref 4.0–5.6)

## 2021-09-23 MED ORDER — HUMALOG KWIKPEN 200 UNIT/ML ~~LOC~~ SOPN: PEN_INJECTOR | SUBCUTANEOUS | 2 refills | Status: DC

## 2021-09-23 MED ORDER — OXYCODONE HCL 15 MG PO TABS: 15.0000 mg | ORAL_TABLET | ORAL | 0 refills | Status: DC | PRN

## 2021-09-23 MED ORDER — TIRZEPATIDE 2.5 MG/0.5ML ~~LOC~~ SOAJ
2.5000 mg | SUBCUTANEOUS | 0 refills | Status: DC
Start: 2021-09-23 — End: 2021-12-03

## 2021-09-23 MED ORDER — TRIAMCINOLONE ACETONIDE 0.1 % EX CREA: 1.0000 | TOPICAL_CREAM | Freq: Two times a day (BID) | CUTANEOUS | 2 refills | Status: DC

## 2021-09-23 NOTE — Patient Instructions (Addendum)
Please review the attached list of medications and notify my office if there are any errors.   Start sample of [name] 2.'5mg'$  injection once a week. Sign up for the copay coupon online.

## 2021-09-29 ENCOUNTER — Other Ambulatory Visit: Payer: Self-pay | Admitting: Family Medicine

## 2021-09-29 ENCOUNTER — Other Ambulatory Visit: Payer: Self-pay | Admitting: *Deleted

## 2021-09-29 ENCOUNTER — Telehealth: Payer: Self-pay | Admitting: Family Medicine

## 2021-09-29 DIAGNOSIS — G8929 Other chronic pain: Secondary | ICD-10-CM

## 2021-09-29 DIAGNOSIS — R0789 Other chest pain: Secondary | ICD-10-CM

## 2021-09-29 MED ORDER — OXYCODONE HCL 15 MG PO TABS: 15.0000 mg | ORAL_TABLET | ORAL | 0 refills | Status: DC | PRN

## 2021-09-29 NOTE — Telephone Encounter (Signed)
Patient's normal pharmacy, [facility], does not have Oxycodone 15 mg. But the [facility] in [place] does.   Can you cancel the RX at [facility] and send it to [facility] please? He is leaving for vacation and will be gone 3 weeks.

## 2021-09-29 NOTE — Telephone Encounter (Signed)
Please advise 

## 2021-09-29 NOTE — Telephone Encounter (Signed)
Rx that was sent 09/23/2021 was discontinued.

## 2021-10-25 ENCOUNTER — Other Ambulatory Visit: Payer: Self-pay | Admitting: Family Medicine

## 2021-10-25 DIAGNOSIS — M545 Low back pain, unspecified: Secondary | ICD-10-CM

## 2021-10-25 DIAGNOSIS — G8929 Other chronic pain: Secondary | ICD-10-CM

## 2021-10-25 DIAGNOSIS — R0789 Other chest pain: Secondary | ICD-10-CM

## 2021-10-25 NOTE — Telephone Encounter (Signed)
Medication Refill - Medication:  oxyCODONE (ROXICODONE) 15 MG immediate release tablet 180 tablet    Has the patient contacted their pharmacy? Yes.   (Agent: If no, request that the patient contact the pharmacy for the refill. If patient does not wish to contact the pharmacy document the reason why and proceed with request.) (Agent: If yes, when and what did the pharmacy advise?)call office  Preferred Pharmacy (with phone number or street name):  [facility] [name], [place]  [place] [address]  Phone: [phone] Fax: [phone]  Hours: Not open 24 hours   Has the patient been seen for an appointment in the last year OR does the patient have an upcoming appointment? Yes.    Agent: Please be advised that RX refills may take up to 3 business days. We ask that you follow-up with your pharmacy.

## 2021-10-26 MED ORDER — OXYCODONE HCL 15 MG PO TABS: 15.0000 mg | ORAL_TABLET | ORAL | 0 refills | Status: DC | PRN

## 2021-10-26 NOTE — Telephone Encounter (Signed)
Requested medication (s) are due for refill today: yes  Requested medication (s) are on the active medication list: yes  Last refill:  09/29/21 #180  Future visit scheduled: no  Notes to clinic:   med not delegated to NT to RF   Requested Prescriptions  Pending Prescriptions Disp Refills   oxyCODONE (ROXICODONE) 15 MG immediate release tablet 180 tablet 0    Sig: Take 1 tablet (15 mg total) by mouth every 4 (four) hours as needed for pain.     Not Delegated - Analgesics:  Opioid Agonists Failed - 10/25/2021  5:02 PM      Failed - This refill cannot be delegated      Failed - Urine Drug Screen completed in last 360 days      Passed - Valid encounter within last 3 months    Recent Outpatient Visits           1 month ago Uncontrolled type 2 diabetes mellitus with hyperglycemia [facility])   [facility] Birdie Sons, MD   2 months ago Uncontrolled type 2 diabetes mellitus with hyperglycemia [facility])   [facility] Birdie Sons, MD   4 months ago Intermittent abdominal pain   [facility] Birdie Sons, MD   8 months ago Diabetic ketoacidosis without coma associated with type 2 diabetes mellitus [facility])   [facility] Birdie Sons, MD   11 months ago Type 2 diabetes mellitus with other specified complication, without long-term current use of insulin [facility])   [facility] Birdie Sons, MD       Future Appointments             In 1 month Fisher, Kirstie Peri, MD [facility] [place]

## 2021-11-24 NOTE — Progress Notes (Signed)
I,[name],acting as a scribe for [name], MD.,have documented all relevant documentation on the behalf of [name], MD,as directed by  [name], MD while in the presence of [name], MD.   Established patient visit   Patient: Vincent Hall   DOB: [birth year]   56 y.o. Male  MRN: [id number] Visit Date: 11/25/2021  Today's healthcare provider: [name], MD   Chief Complaint  Patient presents with   Diabetes   Subjective    HPI  Diabetes Mellitus Type II, Follow-up  Lab Results  Component Value Date   HGBA1C 13.6 (A) 09/23/2021   HGBA1C 12.2 (H) 08/02/2021   HGBA1C 10.3 06/10/2021   Wt Readings from Last 3 Encounters:  11/25/21 209 lb (94.8 kg)  09/23/21 211 lb (95.7 kg)  08/02/21 205 lb 4.8 oz (93.1 kg)   Last seen for diabetes on 09/23/2021.   From that visit it was noted that patient was intolerant to oral and injectable semaglutide due to constipation and nausea. During the last visit, patient agreed to try samples of tirzepatide [facility]) 2.5 MG/0.5ML Pen; Inject 2.5 mg into the skin once a week. He reports poor compliance with treatment. Has been out of [name], glipizide and pioglitazone for a few weeks. He has been our of blood pressure medications for over 1 month.  He is not having side effects. Symptoms: Yes fatigue No foot ulcerations  No appetite changes Yes nausea  No paresthesia of the feet  No polydipsia  No polyuria No visual disturbances   Yes vomiting     Home blood sugar records:  300's  Episodes of hypoglycemia? No    Current insulin regiment: Levemir and Humalog Most Recent Eye Exam: 02/01/2021 Current exercise: none Current diet habits: in general, an "unhealthy" diet  Pertinent Labs: Lab Results  Component Value Date   CHOL 151 08/02/2021   HDL 52 08/02/2021   LDLCALC 70 08/02/2021   TRIG 176 (H) 08/02/2021   CHOLHDL 2.9 08/02/2021   Lab Results  Component Value Date   NA 135 (A)  06/10/2021   K 3.5 06/10/2021   CREATININE 0.7 06/10/2021   EGFR 107 06/10/2021   MICROALBUR 20 05/10/2020   LABMICR <3.0 08/02/2021     ---------------------------------------------------------------------------------------------------   Medications: Outpatient Medications Prior to Visit  Medication Sig   albuterol (VENTOLIN HFA) 108 (90 Base) MCG/ACT inhaler Inhale 2 puffs into the lungs every 6 (six) hours as needed for wheezing or shortness of breath.   Alum & Mag Hydroxide-Simeth (GI COCKTAIL) SUSP suspension Take 30 mLs by mouth 2 (two) times daily as needed for indigestion (abd pain). Shake well. Each dose to containe 33m maalox and 1110mviscous lidocaine   aspirin 81 MG tablet Take 81 mg by mouth daily.   azelastine (OPTIVAR) 0.05 % ophthalmic solution INSTILL ONE DROP TO THE AFFECTED EYE(S) TWICE A DAY   Blood Glucose Monitoring Suppl (ONE TOUCH ULTRA 2) w/Device KIT USE AS DIRECTED   citalopram (CELEXA) 40 MG tablet TAKE ONE TABLET BY MOUTH DAILY   cyclobenzaprine (FLEXERIL) 5 MG tablet TAKE 1 TABLET BY MOUTH 3 TIMES DAILY AS NEEDED MUSCLE SPASMS   glipiZIDE (GLUCOTROL XL) 5 MG 24 hr tablet TAKE 1 TABLET BY MOUTH ONCE DAILY WITH BREAKFAST   glucose blood (CONTOUR TEST) test strip Check blood sugars 3 times daily. E11.65 (uncontrolled insulin dependent diabetes)   insulin detemir (LEVEMIR FLEXTOUCH) 100 UNIT/ML FlexPen Inject 48 Units into the skin daily.   insulin lispro (  HUMALOG KWIKPEN) 200 UNIT/ML KwikPen Use before meals up to three times a day. Take 6 units if sugar is over 200. 8 units if over 250 and 10 units if over 300.   Insulin Pen Needle 32G X 4 MM MISC 1 each by Does not apply route 4 (four) times daily -  before meals and at bedtime.   lisinopril (ZESTRIL) 20 MG tablet Take 1 tablet (20 mg total) by mouth daily.   methocarbamol (ROBAXIN) 750 MG tablet Take 1 tablet (750 mg total) by mouth every 6 (six) hours as needed for muscle spasms (back pain).    metoCLOPramide (REGLAN) 10 MG tablet Take 1 tablet (10 mg total) by mouth 4 (four) times daily -  before meals and at bedtime.   nitroGLYCERIN (NITROSTAT) 0.4 MG SL tablet Place 1 tablet (0.4 mg total) under the tongue every 5 (five) minutes as needed for chest pain.   oxyCODONE (ROXICODONE) 15 MG immediate release tablet Take 1 tablet (15 mg total) by mouth every 4 (four) hours as needed for pain.   pantoprazole (PROTONIX) 40 MG tablet Take 1 tablet (40 mg total) by mouth daily.   pioglitazone (ACTOS) 30 MG tablet Take 1 tablet (30 mg total) by mouth daily.   polyethylene glycol-electrolytes (NULYTELY) 420 g solution Prepare according to package instructions. Starting at 5:00 PM: Drink one 8 oz glass of mixture every 15 minutes until you finish half of the jug. Five hours prior to procedure, drink 8 oz glass of mixture every 15 minutes until it is all gone. Make sure you do not drink anything 4 hours prior to your procedure.   rosuvastatin (CRESTOR) 40 MG tablet TAKE 1 TABLET BY MOUTH ONCE DAILY   sucralfate (CARAFATE) 1 g tablet Take 1 tablet (1 g total) by mouth 4 (four) times daily.   tirzepatide [facility]) 2.5 MG/0.5ML Pen Inject 2.5 mg into the skin once a week.   traZODone (DESYREL) 100 MG tablet TAKE 2 TABLETS BY MOUTH AT BEDTIME   triamcinolone cream (KENALOG) 0.1 % Apply 1 Application topically 2 (two) times daily.   No facility-administered medications prior to visit.    Review of Systems  Constitutional:  Positive for fatigue. Negative for appetite change, chills and fever.  Respiratory:  Negative for chest tightness, shortness of breath and wheezing.   Cardiovascular:  Positive for chest pain. Negative for palpitations.  Gastrointestinal:  Positive for abdominal distention, abdominal pain, nausea and vomiting.  Musculoskeletal:  Positive for myalgias.  Neurological:  Positive for headaches.       Objective    BP (!) 186/81 (BP Location: Left Arm, Patient Position: Sitting,  Cuff Size: Large)   Pulse 64   Resp (!) 24   Wt 209 lb (94.8 kg)   SpO2 100% Comment: room air  BMI 31.78 kg/m   Today's Vitals   11/25/21 1552 11/25/21 1555  BP: (!) 185/84 (!) 186/81  Pulse: 64   Resp: (!) 24   SpO2: 100%   Weight: 209 lb (94.8 kg)    Body mass index is 31.78 kg/m.  Physical Exam   General appearance: Mildly obese male, cooperative and in no acute distress Head: Normocephalic, without obvious abnormality, atraumatic Respiratory: Respirations even and unlabored, normal respiratory rate Extremities: All extremities are intact.  Skin: Skin color, texture, turgor normal. No rashes seen  Psych: Appropriate mood and affect. Neurologic: Mental status: Alert, oriented to person, place, and time, thought content appropriate.   Results for orders placed or performed in visit  on 11/25/21  POCT Glucose (CBG)  Result Value Ref Range   POC Glucose 356 (A) 70 - 99 mg/dl    Assessment & Plan     1. Stomach irritation Secondary to chronic gastritis as below. He had been doing well until he ran of sucralfate. He is having significant abdominal burning today which he states is identical to previous episodes of gastrites.   2. Chronic gastritis without bleeding, unspecified gastritis type Has run out of sucralfate. refill sucralfate (CARAFATE) 1 g tablet; Take 1 tablet (1 g total) by mouth 4 (four) times daily.  Dispense: 56 tablet; Refill: 12  Continue current PPI. Call or go to ER if symptoms do  not rapidly improve when he starts back on sucralfate.  3. Type 2 diabetes mellitus with other specified complication, without long-term current use of insulin ([place])  Has started on mounjaro samples for the last month which he states he is tolerating well and has seen signfician improvement in sugars. He had GI side effects with Ozempic in the past, so will stay on starting dose of Mounjara for another month or two. Sent in prescription today for  tirzepatide [facility]) 2.5  MG/0.5ML Pen; Inject 2.5 mg into the skin once a week.  Dispense: 2 mL; Refill: 3    refill insulin aspart (NOVOLOG FLEXPEN) 100 UNIT/ML FlexPen; Use before meals up to three times a day. Take 6 units if sugar is over 200. 8 units if over 250 and 10 units if over 300.  Dispense: 15 mL; Refill: 11 refill glipiZIDE (GLUCOTROL XL) 5 MG 24 hr tablet; Take 1 tablet (5 mg total) by mouth daily with breakfast.  Dispense: 90 tablet; Refill: 4  refill pioglitazone (ACTOS) 30 MG tablet; Take 1 tablet (30 mg total) by mouth daily.  Dispense: 90 tablet; Refill: 4  4. Chronic midline low back pain without sciatica  5. Chest wall pain  Chronic pain adequately controlled on current medications. Refill  oxyCODONE (ROXICODONE) 15 MG immediate release tablet; Take 1 tablet (15 mg total) by mouth every 4 (four) hours as needed for pain.  Dispense: 180 tablet; Refill: 0  6. Chronic left SI joint pain Continue current pain medication regiment.       The entirety of the information documented in the History of Present Illness, Review of Systems and Physical Exam were personally obtained by me. Portions of this information were initially documented by the CMA and reviewed by me for thoroughness and accuracy.     [name], MD  [facility] [phone] (phone) [phone] (fax)  [place]

## 2021-11-25 ENCOUNTER — Encounter: Payer: Self-pay | Admitting: Family Medicine

## 2021-11-25 ENCOUNTER — Ambulatory Visit: Payer: BC Managed Care – PPO | Admitting: Family Medicine

## 2021-11-25 VITALS — BP 186/81 | HR 64 | Resp 24 | Wt 209.0 lb

## 2021-11-25 DIAGNOSIS — K2950 Unspecified chronic gastritis without bleeding: Secondary | ICD-10-CM | POA: Diagnosis not present

## 2021-11-25 DIAGNOSIS — E1165 Type 2 diabetes mellitus with hyperglycemia: Secondary | ICD-10-CM | POA: Diagnosis not present

## 2021-11-25 DIAGNOSIS — G8929 Other chronic pain: Secondary | ICD-10-CM

## 2021-11-25 DIAGNOSIS — K3189 Other diseases of stomach and duodenum: Secondary | ICD-10-CM

## 2021-11-25 DIAGNOSIS — E1169 Type 2 diabetes mellitus with other specified complication: Secondary | ICD-10-CM

## 2021-11-25 DIAGNOSIS — M545 Low back pain, unspecified: Secondary | ICD-10-CM

## 2021-11-25 DIAGNOSIS — R0789 Other chest pain: Secondary | ICD-10-CM

## 2021-11-25 DIAGNOSIS — M533 Sacrococcygeal disorders, not elsewhere classified: Secondary | ICD-10-CM

## 2021-11-25 LAB — GLUCOSE, POCT (MANUAL RESULT ENTRY): POC Glucose: 356 mg/dl — AB (ref 70–99)

## 2021-11-25 MED ORDER — GLIPIZIDE ER 5 MG PO TB24: 5.0000 mg | ORAL_TABLET | Freq: Every day | ORAL | 4 refills | Status: DC

## 2021-11-25 MED ORDER — PIOGLITAZONE HCL 30 MG PO TABS: 30.0000 mg | ORAL_TABLET | Freq: Every day | ORAL | 4 refills | Status: DC

## 2021-11-25 MED ORDER — NOVOLOG FLEXPEN 100 UNIT/ML ~~LOC~~ SOPN: PEN_INJECTOR | SUBCUTANEOUS | 11 refills | Status: DC

## 2021-11-25 MED ORDER — SUCRALFATE 1 G PO TABS: 1.0000 g | ORAL_TABLET | Freq: Four times a day (QID) | ORAL | 12 refills | Status: DC

## 2021-11-25 MED ORDER — OXYCODONE HCL 15 MG PO TABS: 15.0000 mg | ORAL_TABLET | ORAL | 0 refills | Status: DC | PRN

## 2021-12-03 MED ORDER — TIRZEPATIDE 2.5 MG/0.5ML ~~LOC~~ SOAJ
2.5000 mg | SUBCUTANEOUS | 3 refills | Status: DC
Start: 2021-12-03 — End: 2022-01-12

## 2021-12-06 ENCOUNTER — Ambulatory Visit: Payer: Self-pay | Admitting: *Deleted

## 2021-12-06 NOTE — Patient Instructions (Signed)
Visit Information  Thank you for taking time to visit with me today. Please don't hesitate to contact me if I can be of assistance to you.   Following are the goals we discussed today:   Goals Addressed             This Visit's Progress    care coordination activities       Care Coordination Interventions: Case Management Program offered and accepted for assisted with Diabetes Management SDOH completed         Our next appointment is by telephone on 12/08/21 at 1pm  Please call the care guide team at [phone] if you need to cancel or reschedule your appointment.   If you are experiencing a Mental Health or [place] or need someone to talk to, please call the Suicide and Crisis Lifeline: 988   Patient verbalizes understanding of instructions and care plan provided today and agrees to view in [place]. Active MyChart status and patient understanding of how to access instructions and care plan via MyChart confirmed with patient.     Telephone follow up appointment with care management team member scheduled for:12/08/21  Vincent Hall, [place] Worker  [facility] Care Management [phone]

## 2021-12-06 NOTE — Patient Outreach (Signed)
  Care Coordination   Initial Visit Note   12/06/2021 Name: Vincent Hall MRN: [id number] DOB: 04-27-1965  Vincent Hall is a 56 y.o. year old male who sees Fisher, Kirstie Peri, MD for primary care. I spoke with  Vincent Hall by phone today.  What matters to the patients health and wellness today?  Diabetes management     Goals Addressed             This Visit's Progress    care coordination activities       Care Coordination Interventions: Case Management Program offered and accepted for assisted with Diabetes Management SDOH completed         SDOH assessments and interventions completed:  Yes  SDOH Interventions Today    Flowsheet Row Most Recent Value  SDOH Interventions   Housing Interventions Intervention Not Indicated  Stress Interventions Other (Comment)  [counseling offered, declined]  Transportation Interventions Intervention Not Indicated  Depression Interventions/Treatment  Patient refuses Treatment        Care Coordination Interventions Activated:  Yes  Care Coordination Interventions:  Yes, provided   Follow up plan: Follow up call scheduled for 12/08/21    Encounter Outcome:  Pt. Scheduled

## 2021-12-07 ENCOUNTER — Other Ambulatory Visit: Payer: Self-pay | Admitting: Family Medicine

## 2021-12-07 DIAGNOSIS — E1169 Type 2 diabetes mellitus with other specified complication: Secondary | ICD-10-CM

## 2021-12-08 ENCOUNTER — Ambulatory Visit: Payer: Self-pay

## 2021-12-08 NOTE — Patient Outreach (Signed)
  Care Coordination   12/08/2021 Name: Vincent Hall MRN: [id number] DOB: 1965-07-10   Care Coordination Outreach Attempts:  An unsuccessful telephone outreach was attempted today to offer the patient information about available care coordination services as a benefit of their health plan.   Follow Up Plan:  Additional outreach attempts will be made to offer the patient care coordination information and services.   Encounter Outcome:  No Answer  Care Coordination Interventions Activated:  No   Care Coordination Interventions:  No, not indicated    Noreene Larsson RN, MSN, CCM Community Care Coordinator [facility] Mobile: [phone]

## 2021-12-14 ENCOUNTER — Telehealth: Payer: Self-pay

## 2021-12-14 NOTE — Chronic Care Management (AMB) (Signed)
  Care Coordination  Outreach Note  12/14/2021 Name: Vincent Hall MRN: [id number] DOB: Oct 04, 1965   Care Coordination Outreach Attempts: An unsuccessful telephone outreach was attempted today to offer the patient information about available care coordination services as a benefit of their health plan.   Follow Up Plan:  Additional outreach attempts will be made to offer the patient care coordination information and services.   Encounter Outcome:  No Answer  Sig Noreene Larsson, [place], [place] [address] Direct Dial: [phone] [email or web address]

## 2021-12-21 ENCOUNTER — Other Ambulatory Visit: Payer: Self-pay | Admitting: Family Medicine

## 2021-12-21 DIAGNOSIS — R0789 Other chest pain: Secondary | ICD-10-CM

## 2021-12-21 DIAGNOSIS — G8929 Other chronic pain: Secondary | ICD-10-CM

## 2021-12-21 DIAGNOSIS — M545 Low back pain, unspecified: Secondary | ICD-10-CM

## 2021-12-21 NOTE — Telephone Encounter (Signed)
Medication Refill - Medication: oxyCODONE (ROXICODONE) 15 MG immediate release tablet    Has the patient contacted their pharmacy? Yes.   (Agent: If no, request that the patient contact the pharmacy for the refill. If patient does not wish to contact the pharmacy document the reason why and proceed with request.) (Agent: If yes, when and what did the pharmacy advise?)  Preferred Pharmacy (with phone number or street name): [facility] [name], [place]  [place], [place] [address]  Phone:  [phone]  Fax:  [phone]  DEA #:  -- Has the patient been seen for an appointment in the last year OR does the patient have an upcoming appointment? Yes.    Agent: Please be advised that RX refills may take up to 3 business days. We ask that you follow-up with your pharmacy.

## 2021-12-22 NOTE — Telephone Encounter (Signed)
Requested medication (s) are due for refill today: yes   Requested medication (s) are on the active medication list: yes   Last refill:  11/25/21 #180 0 refills  Future visit scheduled: no   Notes to clinic:  not delegated per protocol . Do you want to refill Rx?     Requested Prescriptions  Pending Prescriptions Disp Refills   oxyCODONE (ROXICODONE) 15 MG immediate release tablet 180 tablet 0    Sig: Take 1 tablet (15 mg total) by mouth every 4 (four) hours as needed for pain.     Not Delegated - Analgesics:  Opioid Agonists Failed - 12/21/2021  2:34 PM      Failed - This refill cannot be delegated      Failed - Urine Drug Screen completed in last 360 days      Passed - Valid encounter within last 3 months    Recent Outpatient Visits           3 weeks ago Stomach irritation   [facility] Birdie Sons, MD   3 months ago Uncontrolled type 2 diabetes mellitus with hyperglycemia [facility])   [facility] Birdie Sons, MD   4 months ago Uncontrolled type 2 diabetes mellitus with hyperglycemia [facility])   [facility] Birdie Sons, MD   6 months ago Intermittent abdominal pain   Byng, MD   10 months ago Diabetic ketoacidosis without coma associated with type 2 diabetes mellitus [facility])   [facility] [address] Birdie Sons, MD

## 2021-12-23 NOTE — Chronic Care Management (AMB) (Signed)
  Care Coordination Note  12/23/2021 Name: [name] MRN: [id number] DOB: [birth year]  Vincent Hall is a 55 y.o. year old male who is a primary care patient of Caryn Section, Kirstie Peri, MD and is actively engaged with the care management team. I reached out to Vincent Hall by phone today to assist with re-scheduling an initial visit with the RN Case Manager  Follow up plan: Telephone appointment with care management team member scheduled for:12/26/2021  Noreene Larsson, [place], [place] [address] Direct Dial: [phone] [email or web address]

## 2021-12-24 MED ORDER — OXYCODONE HCL 15 MG PO TABS: 15.0000 mg | ORAL_TABLET | ORAL | 0 refills | Status: DC | PRN

## 2021-12-26 ENCOUNTER — Ambulatory Visit: Payer: Self-pay

## 2021-12-26 NOTE — Patient Outreach (Signed)
Care Coordination   Initial Visit Note   12/26/2021 Name: Vincent Hall MRN: [id number] DOB: 18-Apr-1965  Vincent Hall is a 56 y.o. year old male who sees Vincent Hall, Vincent Peri, MD for primary care. I spoke with  Vincent Hall by phone today.  What matters to the patients health and wellness today?  DM management and pain management. Also asking for assistance with cost constraints with medications    Goals Addressed             This Visit's Progress    RNCM: Effective Management of DM       Care Coordination Interventions:  Lab Results  Component Value Date   HGBA1C 13.6 (A) 09/23/2021    Provided education to patient about basic DM disease process Reviewed medications with patient and discussed importance of medication adherence. The patient is having cost constraint issues with affordability of medications and sometimes goes without. Is receptive to a pharmacy consult. Counseled on importance of regular laboratory monitoring as prescribed Discussed plans with patient for ongoing care management follow up and provided patient with direct contact information for care management team Provided patient with written educational materials related to hypo and hyperglycemia and importance of correct treatment Reviewed scheduled/upcoming provider appointments including: recommended that the patient call and get an appointment with pcp for review of DM and new A1C. Advised patient, providing education and rationale, to check cbg BID and record, calling pcp for findings outside established parameters. The patient is checking his blood sugars bid. The patient states it is better and in the 200's instead of 300's and 400's. Review with the patient the need of fasting blood sugars <130 and post prandial <180. Will send information to the patient by myChart. Referral made to pharmacy team for assistance with The patient states he is on disability and cannot afford his medications. He would  like to talk to pharmacist for ideas and support of medication cost.  Is receptive to talking to pharm D for help with medication cost and obtaining medications without having to go without.  Review of patient status, including review of consultants reports, relevant laboratory and other test results, and medications completed Screening for signs and symptoms of depression related to chronic disease state  Assessed social determinant of health barriers The patient is receptive to working with the [facility] for help with management of chronic conditions. Review of the goals of the program.        RNCM: Effective Management of Pain       Care Coordination Interventions: Reviewed provider established plan for pain management Discussed importance of adherence to all scheduled medical appointments Counseled on the importance of reporting any/all new or changed pain symptoms or management strategies to pain management provider Advised patient to report to care team affect of pain on daily activities Discussed use of relaxation techniques and/or diversional activities to assist with pain reduction (distraction, imagery, relaxation, massage, acupressure, TENS, heat, and cold application Reviewed with patient prescribed pharmacological and nonpharmacological pain relief strategies Advised patient to discuss unresolved pain or changes in level or intensity of pain with provider The patient states he feels like his pain medications are not effective anymore that he has been taking them too long. States his pain was not bad today because he had taken his pain medications about an hour before the call. The patient ask if he could get more pain medications. Recommendations on getting an appointment with the pcp and asking for referral for pain  management or other recommendations the pcp may have for effective management of his pain management. States his pain is mainly in his back but sometimes he has it in his back.            SDOH assessments and interventions completed:  Yes  SDOH Interventions Today    Flowsheet Row Most Recent Value  SDOH Interventions   Food Insecurity Interventions Intervention Not Indicated  Housing Interventions Intervention Not Indicated  Transportation Interventions Intervention Not Indicated  Utilities Interventions Intervention Not Indicated  Financial Strain Interventions Other (Comment)  [can not afford some of his medications]        Care Coordination Interventions Activated:  Yes  Care Coordination Interventions:  Yes, provided   Follow up plan: Follow up call scheduled for 02-13-2022 at 330 pm    Encounter Outcome:  Pt. Visit Completed   Noreene Larsson RN, MSN, [facility]  Mobile: [phone]

## 2021-12-26 NOTE — Patient Instructions (Signed)
Visit Information  Thank you for taking time to visit with me today. Please don't hesitate to contact me if I can be of assistance to you.   Following are the goals we discussed today:   Goals Addressed             This Visit's Progress    RNCM: Effective Management of DM       Care Coordination Interventions:  Lab Results  Component Value Date   HGBA1C 13.6 (A) 09/23/2021    Provided education to patient about basic DM disease process Reviewed medications with patient and discussed importance of medication adherence. The patient is having cost constraint issues with affordability of medications and sometimes goes without. Is receptive to a pharmacy consult. Counseled on importance of regular laboratory monitoring as prescribed Discussed plans with patient for ongoing care management follow up and provided patient with direct contact information for care management team Provided patient with written educational materials related to hypo and hyperglycemia and importance of correct treatment Reviewed scheduled/upcoming provider appointments including: recommended that the patient call and get an appointment with pcp for review of DM and new A1C. Advised patient, providing education and rationale, to check cbg BID and record, calling pcp for findings outside established parameters. The patient is checking his blood sugars bid. The patient states it is better and in the 200's instead of 300's and 400's. Review with the patient the need of fasting blood sugars <130 and post prandial <180. Will send information to the patient by myChart. Referral made to pharmacy team for assistance with The patient states he is on disability and cannot afford his medications. He would like to talk to pharmacist for ideas and support of medication cost.  Is receptive to talking to pharm D for help with medication cost and obtaining medications without having to go without.  Review of patient status, including  review of consultants reports, relevant laboratory and other test results, and medications completed Screening for signs and symptoms of depression related to chronic disease state  Assessed social determinant of health barriers The patient is receptive to working with the [facility] for help with management of chronic conditions. Review of the goals of the program.        RNCM: Effective Management of Pain       Care Coordination Interventions: Reviewed provider established plan for pain management Discussed importance of adherence to all scheduled medical appointments Counseled on the importance of reporting any/all new or changed pain symptoms or management strategies to pain management provider Advised patient to report to care team affect of pain on daily activities Discussed use of relaxation techniques and/or diversional activities to assist with pain reduction (distraction, imagery, relaxation, massage, acupressure, TENS, heat, and cold application Reviewed with patient prescribed pharmacological and nonpharmacological pain relief strategies Advised patient to discuss unresolved pain or changes in level or intensity of pain with provider The patient states he feels like his pain medications are not effective anymore that he has been taking them too long. States his pain was not bad today because he had taken his pain medications about an hour before the call. The patient ask if he could get more pain medications. Recommendations on getting an appointment with the pcp and asking for referral for pain management or other recommendations the pcp may have for effective management of his pain management. States his pain is mainly in his back but sometimes he has it in his back.  Our next appointment is by telephone on 02-13-2022 at 330 pm  Please call the care guide team at [phone] if you need to cancel or reschedule your appointment.   If you are experiencing a Mental Health or  [place] or need someone to talk to, please call the Suicide and Crisis Lifeline: 988 call the [place] National Suicide Prevention Lifeline: 906 384 5546 or TTY: (850)590-7214 TTY 3172005687) to talk to a trained counselor call 1-800-273-TALK (toll free, 24 hour hotline)  Patient verbalizes understanding of instructions and care plan provided today and agrees to view in [place]. Active MyChart status and patient understanding of how to access instructions and care plan via MyChart confirmed with patient.     Telephone follow up appointment with care management team member scheduled for: 02-13-2022 at 330 pm  [place], MSN, [place]  Mobile: [phone]    Hemoglobin A1C Test Why am I having this test? You may have the hemoglobin A1C test (A1C test) done to: Check your risk for developing diabetes. Diagnose diabetes. Check the long-term control of blood sugar (glucose) in people who have diabetes and help make treatment decisions. This test may be done with other blood glucose tests, such as fasting blood glucose and oral glucose tolerance tests. What is being tested? Hemoglobin is a type of protein in the blood that carries oxygen. Glucose attaches to hemoglobin to form glycated hemoglobin. This test checks the amount of glycated hemoglobin in your blood. This is a good indicator of the average amount of glucose in your blood during the past 2-3 months. What kind of sample is taken?  A blood sample is required for this test. It is usually collected by inserting a needle into a blood vessel. Tell a health care provider about: All medicines you are taking, including vitamins, herbs, eye drops, creams, and over-the-counter medicines. Any blood disorders you have. Any surgeries you have had. Any medical conditions you have. Whether you are pregnant or may be pregnant. How are the results reported? Your results will be reported as a  percentage indicating how much of your hemoglobin has glucose attached to it. Your health care provider will compare your results to normal ranges established after testing a large group of people (reference ranges). Reference ranges may vary among labs and hospitals. For this test, common reference ranges are: Adult or child without diabetes: 4-5.6%. Adult or child with prediabetes: 5.7%-6.4%. Adult or child with diabetes: 6.5% or higher. What do the results mean? If you do not have diabetes: A result within the reference range means that you are not at high risk for diabetes. A result of 5.7-6.4% means that you have a high risk of developing diabetes, and you have prediabetes. Having prediabetes puts you at risk for developing type 2 diabetes. You may have more tests, including a repeat A1C test. Results of 6.5% or higher on two separate A1C tests mean that you have diabetes. You may have more tests to confirm the diagnosis. If you have diabetes: A result of 7% usually means that your diabetes is under control. A result of less than 7% also means that diabetes is under control. This result may be an appropriate goal for those for whom there is a low risk of low blood sugar (hypoglycemia). A result of less than 8% may also mean that diabetes is under control. This result may be an appropriate goal for those who do not benefit from more blood glucose control or who  are at high risk for hypoglycemia. Abnormally low A1C values may be caused by: Pregnancy. Severe blood loss. Receiving donated blood (transfusions). Low red blood cell count (anemia). Long-term kidney failure. Some unusual forms (variants) of hemoglobin. Talk with your health care provider about what your results mean. Questions to ask your health care provider Ask your health care provider, or the department that is doing the test: When will my results be ready? How will I get my results? What are my treatment options? What  other tests do I need? What are my next steps? Summary The A1C test is done to check your risk for developing diabetes, diagnose diabetes, and check the long-term control of blood sugar (glucose) in people who have diabetes and help make treatment decisions. Hemoglobin is a type of protein in the blood that carries oxygen. Glucose attaches to hemoglobin to form glycated hemoglobin. This test checks the amount of glycated hemoglobin in your blood. Talk with your health care provider about what your results mean. This information is not intended to replace advice given to you by your health care provider. Make sure you discuss any questions you have with your health care provider. Document Revised: 05/11/2021 Document Reviewed: 05/11/2021 Elsevier Patient Education  [place].   Diabetes Mellitus and Nutrition, Adult When you have diabetes, or diabetes mellitus, it is very important to have healthy eating habits because your blood sugar (glucose) levels are greatly affected by what you eat and drink. Eating healthy foods in the right amounts, at about the same times every day, can help you: Manage your blood glucose. Lower your risk of heart disease. Improve your blood pressure. Reach or maintain a healthy weight. What can affect my meal plan? Every person with diabetes is different, and each person has different needs for a meal plan. Your health care provider may recommend that you work with a dietitian to make a meal plan that is best for you. Your meal plan may vary depending on factors such as: The calories you need. The medicines you take. Your weight. Your blood glucose, blood pressure, and cholesterol levels. Your activity level. Other health conditions you have, such as heart or kidney disease. How do carbohydrates affect me? Carbohydrates, also called carbs, affect your blood glucose level more than any other type of food. Eating carbs raises the amount of glucose in your  blood. It is important to know how many carbs you can safely have in each meal. This is different for every person. Your dietitian can help you calculate how many carbs you should have at each meal and for each snack. How does alcohol affect me? Alcohol can cause a decrease in blood glucose (hypoglycemia), especially if you use insulin or take certain diabetes medicines by mouth. Hypoglycemia can be a life-threatening condition. Symptoms of hypoglycemia, such as sleepiness, dizziness, and confusion, are similar to symptoms of having too much alcohol. Do not drink alcohol if: Your health care provider tells you not to drink. You are pregnant, may be pregnant, or are planning to become pregnant. If you drink alcohol: Limit how much you have to: 0-1 drink a day for women. 0-2 drinks a day for men. Know how much alcohol is in your drink. In the [place] one drink equals one 12 oz bottle of beer (355 mL), one 5 oz glass of wine (148 mL), or one 1 oz glass of hard liquor (44 mL). Keep yourself hydrated with water, diet soda, or unsweetened iced tea. Keep in mind  that regular soda, juice, and other mixers may contain a lot of sugar and must be counted as carbs. What are tips for following this plan?  Reading food labels Start by checking the serving size on the Nutrition Facts label of packaged foods and drinks. The number of calories and the amount of carbs, fats, and other nutrients listed on the label are based on one serving of the item. Many items contain more than one serving per package. Check the total grams (g) of carbs in one serving. Check the number of grams of saturated fats and trans fats in one serving. Choose foods that have a low amount or none of these fats. Check the number of milligrams (mg) of salt (sodium) in one serving. Most people should limit total sodium intake to less than 2,300 mg per day. Always check the nutrition information of foods labeled as "low-fat" or "nonfat." These  foods may be higher in added sugar or refined carbs and should be avoided. Talk to your dietitian to identify your daily goals for nutrients listed on the label. Shopping Avoid buying canned, pre-made, or processed foods. These foods tend to be high in fat, sodium, and added sugar. Shop around the outside edge of the grocery store. This is where you will most often find fresh fruits and vegetables, bulk grains, fresh meats, and fresh dairy products. Cooking Use low-heat cooking methods, such as baking, instead of high-heat cooking methods, such as deep frying. Cook using healthy oils, such as olive, canola, or sunflower oil. Avoid cooking with butter, cream, or high-fat meats. Meal planning Eat meals and snacks regularly, preferably at the same times every day. Avoid going long periods of time without eating. Eat foods that are high in fiber, such as fresh fruits, vegetables, beans, and whole grains. Eat 4-6 oz (112-168 g) of lean protein each day, such as lean meat, chicken, fish, eggs, or tofu. One ounce (oz) (28 g) of lean protein is equal to: 1 oz (28 g) of meat, chicken, or fish. 1 egg.  cup (62 g) of tofu. Eat some foods each day that contain healthy fats, such as avocado, nuts, seeds, and fish. What foods should I eat? Fruits Berries. Apples. Oranges. Peaches. Apricots. Plums. Grapes. Mangoes. Papayas. Pomegranates. Kiwi. Cherries. Vegetables Leafy greens, including lettuce, spinach, kale, chard, collard greens, mustard greens, and cabbage. Beets. Cauliflower. Broccoli. Carrots. Green beans. Tomatoes. Peppers. Onions. Cucumbers. Brussels sprouts. Grains Whole grains, such as whole-wheat or whole-grain bread, crackers, tortillas, cereal, and pasta. Unsweetened oatmeal. Quinoa. Brown or wild rice. Meats and other proteins Seafood. Poultry without skin. Lean cuts of poultry and beef. Tofu. Nuts. Seeds. Dairy Low-fat or fat-free dairy products such as milk, yogurt, and cheese. The  items listed above may not be a complete list of foods and beverages you can eat and drink. Contact a dietitian for more information. What foods should I avoid? Fruits Fruits canned with syrup. Vegetables Canned vegetables. Frozen vegetables with butter or cream sauce. Grains Refined white flour and flour products such as bread, pasta, snack foods, and cereals. Avoid all processed foods. Meats and other proteins Fatty cuts of meat. Poultry with skin. Breaded or fried meats. Processed meat. Avoid saturated fats. Dairy Full-fat yogurt, cheese, or milk. Beverages Sweetened drinks, such as soda or iced tea. The items listed above may not be a complete list of foods and beverages you should avoid. Contact a dietitian for more information. Questions to ask a health care provider Do I need to meet  with a certified diabetes care and education specialist? Do I need to meet with a dietitian? What number can I call if I have questions? When are the best times to check my blood glucose? Where to find more information: American Diabetes Association: diabetes.org Academy of Nutrition and Dietetics: eatright.[employer] of Diabetes and Digestive and Kidney Diseases: [employer] Association of Diabetes Care & Education Specialists: diabeteseducator.org Summary It is important to have healthy eating habits because your blood sugar (glucose) levels are greatly affected by what you eat and drink. It is important to use alcohol carefully. A healthy meal plan will help you manage your blood glucose and lower your risk of heart disease. Your health care provider may recommend that you work with a dietitian to make a meal plan that is best for you. This information is not intended to replace advice given to you by your health care provider. Make sure you discuss any questions you have with your health care provider. Document Revised: 10/29/2019 Document Reviewed: 10/29/2019 Elsevier Patient  Education  [place].   Chronic Back Pain When back pain lasts longer than 3 months, it is called chronic back pain. The cause of your back pain may not be known. Some common causes include: Wear and tear (degenerative disease) of the bones, ligaments, or disks in your back. Inflammation and stiffness in your back (arthritis). People who have chronic back pain often go through certain periods in which the pain is more intense (flare-ups). Many people can learn to manage the pain with home care. Follow these instructions at home: Pay attention to any changes in your symptoms. Take these actions to help with your pain: Managing pain and stiffness     If directed, apply ice to the painful area. Your health care provider may recommend applying ice during the first 24-48 hours after a flare-up begins. To do this: Put ice in a plastic bag. Place a towel between your skin and the bag. Leave the ice on for 20 minutes, 2-3 times per day. If directed, apply heat to the affected area as often as told by your health care provider. Use the heat source that your health care provider recommends, such as a moist heat pack or a heating pad. Place a towel between your skin and the heat source. Leave the heat on for 20-30 minutes. Remove the heat if your skin turns bright red. This is especially important if you are unable to feel pain, heat, or cold. You may have a greater risk of getting burned. Try soaking in a warm tub. Activity  Avoid bending and other activities that make the problem worse. Maintain a proper position when standing or sitting: When standing, keep your upper back and neck straight, with your shoulders pulled back. Avoid slouching. When sitting, keep your back straight and relax your shoulders. Do not round your shoulders or pull them backward. Do not sit or stand in one place for long periods of time. Take brief periods of rest throughout the day. This will reduce your pain.  Resting in a lying or standing position is usually better than sitting to rest. When you are resting for longer periods, mix in some mild activity or stretching between periods of rest. This will help to prevent stiffness and pain. Get regular exercise. Ask your health care provider what activities are safe for you. Do not lift anything that is heavier than 10 lb (4.5 kg), or the limit that you are told, until your health care  provider says that it is safe. Always use proper lifting technique, which includes: Bending your knees. Keeping the load close to your body. Avoiding twisting. Sleep on a firm mattress in a comfortable position. Try lying on your side with your knees slightly bent. If you lie on your back, put a pillow under your knees. Medicines Treatment may include medicines for pain and inflammation taken by mouth or applied to the skin, prescription pain medicine, or muscle relaxants. Take over-the-counter and prescription medicines only as told by your health care provider. Ask your health care provider if the medicine prescribed to you: Requires you to avoid driving or using machinery. Can cause constipation. You may need to take these actions to prevent or treat constipation: Drink enough fluid to keep your urine pale yellow. Take over-the-counter or prescription medicines. Eat foods that are high in fiber, such as beans, whole grains, and fresh fruits and vegetables. Limit foods that are high in fat and processed sugars, such as fried or sweet foods. General instructions Do not use any products that contain nicotine or tobacco, such as cigarettes, e-cigarettes, and chewing tobacco. If you need help quitting, ask your health care provider. Keep all follow-up visits as told by your health care provider. This is important. Contact a health care provider if: You have pain that is not relieved with rest or medicine. Your pain gets worse, or you have new pain. You have a high  fever. You have rapid weight loss. You have trouble doing your normal activities. Get help right away if: You have weakness or numbness in one or both of your legs or feet. You have trouble controlling your bladder or your bowels. You have severe back pain and have any of the following: Nausea or vomiting. Pain in your abdomen. Shortness of breath or you faint. Summary Chronic back pain is back pain that lasts longer than 3 months. When a flare-up begins, apply ice to the painful area for the first 24-48 hours. Apply a moist heat pad or use a heating pad on the painful area as directed by your health care provider. When you are resting for longer periods, mix in some mild activity or stretching between periods of rest. This will help to prevent stiffness and pain. This information is not intended to replace advice given to you by your health care provider. Make sure you discuss any questions you have with your health care provider. Document Revised: 05/07/2019 Document Reviewed: 05/07/2019 Elsevier Patient Education  [place].

## 2021-12-29 ENCOUNTER — Telehealth: Payer: Self-pay | Admitting: Pharmacist

## 2021-12-29 NOTE — Progress Notes (Signed)
[facility]) Care Management  [place]   12/29/2021  Vincent Hall 18-Nov-1965 [id number]  Reason for referral: Medication Assistance  Referral source: Practice Referral medication(s): Not provided Current insurance:Blue [employer] Blue Shield Commercial      HPI: Including but not limited to:  type 2 diabetes, hyperlipidemia, ESRD, chronic back pain, allergic rhinitis, and CAD.   Objective: Allergies  Allergen Reactions   Montelukast Hives    Singular   Depakote [Divalproex Sodium] Rash   Invokana [Canagliflozin] Other (See Comments)    Balanitis    Semaglutide     Abdominal pain   Metformin And Related     Taken off in in early 2021 due to upset stomach.    Atorvastatin Other (See Comments)    Muscle pain, neck stiffness   Singulair [Montelukast Sodium] Hives    Medications Reviewed Today     Reviewed by [name], RN (Case Manager) on 12/26/21 at [place] List Status: <None>   Medication Order Taking? Sig Documenting Provider Last Dose Status Informant  albuterol (VENTOLIN HFA) 108 (90 Base) MCG/ACT inhaler [id number] No Inhale 2 puffs into the lungs every 6 (six) hours as needed for wheezing or shortness of breath. Birdie Sons, MD Taking Active Other  Alum & Mag Hydroxide-Simeth (GI COCKTAIL) SUSP suspension [id number] No Take 30 mLs by mouth 2 (two) times daily as needed for indigestion (abd pain). Shake well. Each dose to containe 73m maalox and 138mviscous lidocaine PaHarvest DarkMD Taking Active   aspirin 81 MG tablet [id number] Take 81 mg by mouth daily. [provider] Taking Active Other  azelastine (OPTIVAR) 0.05 % ophthalmic solution 39175102585o INSTILL ONE DROP TO THE AFFECTED EYE(S) TWICE A DAY Fisher, DoKirstie PeriMD Taking Active   Blood Glucose Monitoring Suppl (ONE TOUCH ULTRA 2) w/Device KIT [id number] USE AS DIRECTED FiBirdie SonsMD Taking Active Other  citalopram (CELEXA) 40 MG tablet  [id number] TAKE ONE TABLET BY MOUTH DAILY FiBirdie SonsMD Taking Active   cyclobenzaprine (FLEXERIL) 5 MG tablet [id number] TAKE 1 TABLET BY MOUTH 3 TIMES DAILY AS NEEDED MUSCLE SPASMS FiBirdie SonsMD Taking Active   glipiZIDE (GLUCOTROL XL) 5 MG 24 hr tablet [id number] 1 tablet (5 mg total) by mouth daily with breakfast. FiBirdie SonsMD  Active   glucose blood (CONTOUR TEST) test strip [id number] Check blood sugars 3 times daily. E11.65 (uncontrolled insulin dependent diabetes) FiBirdie SonsMD Taking Active   insulin aspart (NOVOLOG FLEXPEN) 100 UNIT/ML FlexPen [id number] before meals up to three times a day. Take 6 units if sugar is over 200. 8 units if over 250 and 10 units if over 300. FiBirdie SonsMD  Active   insulin lispro (HUMALOG KWIKPEN) 200 UNIT/ML KwikPen [id number] before meals up to three times a day. Take 6 units if sugar is over 200. 8 units if over 250 and 10 units if over 300. FiBirdie SonsMD  Active   Insulin Pen Needle 32G X 4 MM MISC [id number] 1 each by Does not apply route 4 (four) times daily -  before meals and at bedtime. SrSidney AceMD Taking Active   LEVEMIR FLEXPEN 100 UNIT/ML FlexPen [id number] 4865NITS INTO THE SKIN DAILY FiBirdie SonsMD  Active   lisinopril (ZESTRIL) 20 MG tablet [id number] Take 1 tablet (20 mg total) by mouth  daily. Sidney Ace, MD Taking Expired 02/26/21 2359   methocarbamol (ROBAXIN) 750 MG tablet [id number] No Take 1 tablet (750 mg total) by mouth every 6 (six) hours as needed for muscle spasms (back pain). Sidney Ace, MD Taking Active   metoCLOPramide (REGLAN) 10 MG tablet [id number] No Take 1 tablet (10 mg total) by mouth 4 (four) times daily -  before meals and at bedtime. Sidney Ace, MD Taking Expired 02/25/21 2359   nitroGLYCERIN (NITROSTAT) 0.4 MG SL tablet [id number] No Place 1 tablet (0.4 mg total) under the tongue every 5 (five) minutes as needed  for chest pain. Birdie Sons, MD Taking Active   oxyCODONE (ROXICODONE) 15 MG immediate release tablet [id number]  Take 1 tablet (15 mg total) by mouth every 4 (four) hours as needed for pain. Birdie Sons, MD  Active   pantoprazole (PROTONIX) 40 MG tablet [id number] No Take 1 tablet (40 mg total) by mouth daily. Birdie Sons, MD Taking Active   pioglitazone (ACTOS) 30 MG tablet [id number]  Take 1 tablet (30 mg total) by mouth daily. Birdie Sons, MD  Active   polyethylene glycol-electrolytes (NULYTELY) 420 g solution [id number] No Prepare according to package instructions. Starting at 5:00 PM: Drink one 8 oz glass of mixture every 15 minutes until you finish half of the jug. Five hours prior to procedure, drink 8 oz glass of mixture every 15 minutes until it is all gone. Make sure you do not drink anything 4 hours prior to your procedure. Jonathon Bellows, MD Taking Active   rosuvastatin (CRESTOR) 40 MG tablet [id number] No TAKE 1 TABLET BY MOUTH ONCE DAILY Birdie Sons, MD Taking Active   sucralfate (CARAFATE) 1 g tablet [id number]  Take 1 tablet (1 g total) by mouth 4 (four) times daily. Birdie Sons, MD  Active   tirzepatide [facility]) 2.5 MG/0.5ML Pen [id number]  Inject 2.5 mg into the skin once a week. Birdie Sons, MD  Active   traZODone (DESYREL) 100 MG tablet [id number] No TAKE 2 TABLETS BY MOUTH AT BEDTIME Birdie Sons, MD Taking Active Self  triamcinolone cream (KENALOG) 0.1 % [id number]  Apply 1 Application topically 2 (two) times daily. Birdie Sons, MD  Active   Med List Note [name], RN 02/26/18 1146): UDS 02/26/2018            Medication Assistance Findings:  Medication assistance needs identified: Spoke with patient. 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 Since he has Pharmacist, community, he does not qualify for medication assistance through patient assistance programming. Patient expressed issues affording Levemir, Novolog, and [name].  Applications for coupon cards were completed. The insulin program at [facility] provided a card and the [employer] Patient Assistance Program provided a coupon card/ Patient communicated understanding that he needs to take the cards that were emailed to him directly from online, to the pharmacy and have them bill both the coupon and his insurance.    For [name]-Card savings are subject to a maximum monthly savings of up to $575 and a separate maximum annual savings of up to $3,450 per calendar year. Copay with card is advertised as $25 but it is dependent on how the patient's plan is structured.  The [employer] Program with [employer] patient's copay will not be over $99--this will cover the [place] and the Novolog.  Additional medication assistance options reviewed with patient as warranted:  Coupon and Discount pharmacy lists  Plan: I will follow up with  the patient in 7-10 business days to see if he needs any help getting his medications accurately billed with the coupon and his insurance.  Blessings,  [name], PharmD, [place] Clinical Pharmacist [phone]

## 2022-01-12 ENCOUNTER — Telehealth: Payer: Self-pay | Admitting: Family Medicine

## 2022-01-12 MED ORDER — TIRZEPATIDE 2.5 MG/0.5ML ~~LOC~~ SOAJ: 2.5000 mg | SUBCUTANEOUS | 3 refills | Status: DC

## 2022-01-12 NOTE — Telephone Encounter (Signed)
Please check with patient and see if he has been able to get [name] filled and if any trouble with medication. If tolerating, then continue medication and schedule follow up in 4 weeks to check A1c.

## 2022-01-18 ENCOUNTER — Other Ambulatory Visit: Payer: Self-pay | Admitting: Family Medicine

## 2022-01-18 DIAGNOSIS — R0789 Other chest pain: Secondary | ICD-10-CM

## 2022-01-18 DIAGNOSIS — G8929 Other chronic pain: Secondary | ICD-10-CM

## 2022-01-18 NOTE — Telephone Encounter (Signed)
Copied from [place] [id number]. Topic: General - Other >> Jan 18, 2022  2:18 PM Everette C wrote: Reason for CRM: Medication Refill - Medication: oxyCODONE (ROXICODONE) 15 MG immediate release tablet [[id number]]   Has the patient contacted their pharmacy? No. (Agent: If no, request that the patient contact the pharmacy for the refill. If patient does not wish to contact the pharmacy document the reason why and proceed with request.) (Agent: If yes, when and what did the pharmacy advise?)  Preferred Pharmacy (with phone number or street name): [facility] [name], [place] [address] Phone: [phone] Fax: [phone] Hours: Not open 24 hours   Has the patient been seen for an appointment in the last year OR does the patient have an upcoming appointment? Yes.    Agent: Please be advised that RX refills may take up to 3 business days. We ask that you follow-up with your pharmacy.

## 2022-01-19 NOTE — Telephone Encounter (Signed)
Requested medications are due for refill today.  unsure  Requested medications are on the active medications list.  yes  Last refill. 12/24/2021 #180 0 rf  Future visit scheduled.   yes  Notes to clinic.  Refill not delegated.    Requested Prescriptions  Pending Prescriptions Disp Refills   oxyCODONE (ROXICODONE) 15 MG immediate release tablet 180 tablet 0    Sig: Take 1 tablet (15 mg total) by mouth every 4 (four) hours as needed for pain.     Not Delegated - Analgesics:  Opioid Agonists Failed - 01/18/2022  2:42 PM      Failed - This refill cannot be delegated      Failed - Urine Drug Screen completed in last 360 days      Passed - Valid encounter within last 3 months    Recent Outpatient Visits           1 month ago Stomach irritation   [facility] Birdie Sons, MD   3 months ago Uncontrolled type 2 diabetes mellitus with hyperglycemia [facility])   [facility] Birdie Sons, MD   5 months ago Uncontrolled type 2 diabetes mellitus with hyperglycemia [facility])   [facility] Birdie Sons, MD   7 months ago Intermittent abdominal pain   [facility] Birdie Sons, MD   11 months ago Diabetic ketoacidosis without coma associated with type 2 diabetes mellitus [facility])   [facility] Birdie Sons, MD       Future Appointments             In 3 weeks Fisher, Kirstie Peri, MD [facility], [place]

## 2022-01-20 MED ORDER — OXYCODONE HCL 15 MG PO TABS: 15.0000 mg | ORAL_TABLET | ORAL | 0 refills | Status: DC | PRN

## 2022-02-07 ENCOUNTER — Other Ambulatory Visit: Payer: Self-pay | Admitting: Family Medicine

## 2022-02-07 ENCOUNTER — Ambulatory Visit: Payer: Self-pay | Admitting: *Deleted

## 2022-02-07 DIAGNOSIS — E1169 Type 2 diabetes mellitus with other specified complication: Secondary | ICD-10-CM

## 2022-02-07 MED ORDER — LEVEMIR FLEXPEN 100 UNIT/ML ~~LOC~~ SOPN: PEN_INJECTOR | SUBCUTANEOUS | 1 refills | Status: DC

## 2022-02-07 NOTE — Telephone Encounter (Signed)
[place] called and spoke to [place], [facility] about the refill(s) requested. Advised Novolog Flexpen was sent on 11/25/21 #15 ml/11 refill(s). She says they have that on file and will refill. Humalog is not needed since Novolog was sent in, Levemir needs a new Rx sent, no refills on file.

## 2022-02-07 NOTE — Telephone Encounter (Signed)
Medication Refill - Medication: NOVOLOG FLEXPEN) 100 UNIT/ML FlexPen insulin lispro (HUMALOG KWIKPEN) 200 UNIT/ML KwikPen LEVEMIR FLEXPEN 100 UNIT/ML FlexPen   Has the patient contacted their pharmacy? yes (Agent: If no, request that the patient contact the pharmacy for the refill. If patient does not wish to contact the pharmacy document the reason why and proceed with request.) (Agent: If yes, when and what did the pharmacy advise?)contact pcp  Preferred Pharmacy (with phone number or street name):  [facility] [name], [place] Phone:  [phone]  Fax:  [phone]     Has the patient been seen for an appointment in the last year OR does the patient have an upcoming appointment? yes  Agent: Please be advised that RX refills may take up to 3 business days. We ask that you follow-up with your pharmacy.

## 2022-02-07 NOTE — Telephone Encounter (Signed)
Requested Prescriptions  Pending Prescriptions Disp Refills  . insulin detemir (LEVEMIR FLEXPEN) 100 UNIT/ML FlexPen 15 mL 1    Sig: INJECT 48 UNITS INTO THE SKIN DAILY     Endocrinology:  Diabetes - Insulins Failed - 02/07/2022  2:13 PM      Failed - HBA1C is between 0 and 7.9 and within 180 days    Hemoglobin A1C  Date Value Ref Range Status  09/23/2021 13.6 (A) 4.0 - 5.6 % Final  06/10/2021 10.3  Final   Hgb A1c MFr Bld  Date Value Ref Range Status  08/02/2021 12.2 (H) 4.8 - 5.6 % Final    Comment:             Prediabetes: 5.7 - 6.4          Diabetes: >6.4          Glycemic control for adults with diabetes: <7.0          Passed - Valid encounter within last 6 months    Recent Outpatient Visits          2 months ago Stomach irritation   [facility] Birdie Sons, MD   4 months ago Uncontrolled type 2 diabetes mellitus with hyperglycemia [facility])   [facility] Birdie Sons, MD   6 months ago Uncontrolled type 2 diabetes mellitus with hyperglycemia [facility])   [facility] Birdie Sons, MD   7 months ago Intermittent abdominal pain   [facility] Birdie Sons, MD   1 year ago Diabetic ketoacidosis without coma associated with type 2 diabetes mellitus [facility])   [place], Kirstie Peri, MD      Future Appointments            Tomorrow Caryn Section Kirstie Peri, MD [facility], [place]   In 3 days Fisher, Kirstie Peri, MD [facility], [place]

## 2022-02-07 NOTE — Telephone Encounter (Signed)
  Chief Complaint: Hyperglycemia Symptoms: BS last HS 467, checked during call 280. States has not been able to get the once weekly insulin([name]) except for first month, states pharmacy told him "Dr. Caryn Section has to tell them it is for sugar." Has not missed any doses of oral meds or insulin. Frequent urination and some dizziness yesterday. States BS has been ranging 400-500 in evenings.  Frequency: Last night Pertinent Negatives: Patient denies  Disposition: '[]'$ ED /'[]'$ Urgent Care (no appt availability in office) / '[x]'$ Appointment(In office/virtual)/ '[]'$  [place] [facility]/ '[]'$ Home Care/ '[]'$ Refused Recommended Disposition /'[]'$ [facility]/ '[]'$  Follow-up with PCP Additional Notes: Appt secured for tomorrow AM with Dr. Caryn Section. Advised ED for any worsening symptoms. Care advise per protocol, verbalizes understanding.  Reason for Disposition  [1] Blood glucose > 300 mg/dL (16.7 mmol/L) AND [2] two or more times in a row  Answer Assessment - Initial Assessment Questions 1. BLOOD GLUCOSE: "What is your blood glucose level?"      280 presently 2. ONSET: "When did you check the blood glucose?"     467 last night. Ranging 400-500 in evenings. 3. USUAL RANGE: "What is your glucose level usually?" (e.g., usual fasting morning value, usual evening value)     200's 4. KETONES: "Do you check for ketones (urine or blood test strips)?" If Yes, ask: "What does the test show now?"      No 5. TYPE 1 or 2:  "Do you know what type of diabetes you have?"  (e.g., Type 1, Type 2, Gestational; doesn't know)       6. INSULIN: "Do you take insulin?" "What type of insulin(s) do you use? What is the mode of delivery? (syringe, pen; injection or pump)?"      Yes, no missed doses 7. DIABETES PILLS: "Do you take any pills for your diabetes?" If Yes, ask: "Have you missed taking any pills recently?"     Yes, no missed doses 8. OTHER SYMPTOMS: "Do you have any symptoms?" (e.g., fever, frequent urination, difficulty  breathing, dizziness, weakness, vomiting)     Frequent urination, dizzy last night  Protocols used: Diabetes - High Blood Sugar-A-AH

## 2022-02-08 ENCOUNTER — Encounter: Payer: Self-pay | Admitting: Family Medicine

## 2022-02-08 ENCOUNTER — Ambulatory Visit (INDEPENDENT_AMBULATORY_CARE_PROVIDER_SITE_OTHER): Payer: BC Managed Care – PPO | Admitting: Family Medicine

## 2022-02-08 VITALS — BP 138/71 | HR 64 | Temp 98.7°F | Resp 14 | Wt 216.0 lb

## 2022-02-08 DIAGNOSIS — E1169 Type 2 diabetes mellitus with other specified complication: Secondary | ICD-10-CM

## 2022-02-08 DIAGNOSIS — M5440 Lumbago with sciatica, unspecified side: Secondary | ICD-10-CM | POA: Diagnosis not present

## 2022-02-08 DIAGNOSIS — G8929 Other chronic pain: Secondary | ICD-10-CM

## 2022-02-08 DIAGNOSIS — G4700 Insomnia, unspecified: Secondary | ICD-10-CM | POA: Diagnosis not present

## 2022-02-08 DIAGNOSIS — R5383 Other fatigue: Secondary | ICD-10-CM | POA: Diagnosis not present

## 2022-02-08 DIAGNOSIS — Z23 Encounter for immunization: Secondary | ICD-10-CM | POA: Diagnosis not present

## 2022-02-08 DIAGNOSIS — E782 Mixed hyperlipidemia: Secondary | ICD-10-CM | POA: Diagnosis not present

## 2022-02-08 DIAGNOSIS — T733XXS Exhaustion due to excessive exertion, sequela: Secondary | ICD-10-CM | POA: Diagnosis not present

## 2022-02-08 DIAGNOSIS — Z794 Long term (current) use of insulin: Secondary | ICD-10-CM

## 2022-02-08 DIAGNOSIS — I2510 Atherosclerotic heart disease of native coronary artery without angina pectoris: Secondary | ICD-10-CM | POA: Diagnosis not present

## 2022-02-08 LAB — POCT GLYCOSYLATED HEMOGLOBIN (HGB A1C)
Est. average glucose Bld gHb Est-mCnc: 318
Hemoglobin A1C: 12.7 % — AB (ref 4.0–5.6)

## 2022-02-08 MED ORDER — DICLOFENAC SODIUM 75 MG PO TBEC: 75.0000 mg | DELAYED_RELEASE_TABLET | Freq: Two times a day (BID) | ORAL | 0 refills | Status: DC | PRN

## 2022-02-08 MED ORDER — TIRZEPATIDE 2.5 MG/0.5ML ~~LOC~~ SOAJ: 2.5000 mg | SUBCUTANEOUS | 3 refills | Status: DC

## 2022-02-08 MED ORDER — LEVEMIR FLEXPEN 100 UNIT/ML ~~LOC~~ SOPN: PEN_INJECTOR | SUBCUTANEOUS | Status: DC

## 2022-02-08 MED ORDER — TRAZODONE HCL 100 MG PO TABS: 200.0000 mg | ORAL_TABLET | Freq: Every day | ORAL | 4 refills | Status: DC

## 2022-02-08 MED ORDER — CYCLOBENZAPRINE HCL 5 MG PO TABS: ORAL_TABLET | ORAL | 11 refills | Status: DC

## 2022-02-08 MED ORDER — TRIAMCINOLONE ACETONIDE 0.1 % EX CREA: 1.0000 | TOPICAL_CREAM | Freq: Two times a day (BID) | CUTANEOUS | 2 refills | Status: DC

## 2022-02-08 NOTE — Patient Instructions (Signed)
Please review the attached list of medications and notify my office if there are any errors.   Increase Levemir to 56 units every day

## 2022-02-08 NOTE — Progress Notes (Signed)
I,[name],acting as a scribe for [name], MD.,have documented all relevant documentation on the behalf of [name], MD,as directed by  [name], MD while in the presence of [name], MD.   Established patient visit   Patient: Vincent Hall   DOB: 1965-12-15   56 y.o. Male  MRN: [id number] Visit Date: 02/08/2022  Today's healthcare provider: [name], MD   Chief Complaint  Patient presents with   Diabetes   Subjective    HPI  Diabetes Mellitus Type II, Follow-up  Lab Results  Component Value Date   HGBA1C 12.7 (A) 02/08/2022   HGBA1C 13.6 (A) 09/23/2021   HGBA1C 12.2 (H) 08/02/2021   Wt Readings from Last 3 Encounters:  02/08/22 216 lb (98 kg)  11/25/21 209 lb (94.8 kg)  09/23/21 211 lb (95.7 kg)   Last seen for diabetes on 11/25/2021.   Management since then includes continuing same medication. He reports fair compliance with treatment. He has not been able to get the once weekly insulin([name]) except for first month apparently due to insurance denial.  He is not having side effects.  Symptoms: Yes fatigue No foot ulcerations  No appetite changes No nausea  No paresthesia of the feet  No polydipsia  Yes polyuria No visual disturbances   No vomiting     Home blood sugar records:  400-500 in the evenings  Episodes of hypoglycemia? No    Current insulin regiment: Humalog and Levemir Most Recent Eye Exam: 1 year ago Current exercise: none Current diet habits: in general, an "unhealthy" diet  Pertinent Labs: Lab Results  Component Value Date   CHOL 151 08/02/2021   HDL 52 08/02/2021   LDLCALC 70 08/02/2021   TRIG 176 (H) 08/02/2021   CHOLHDL 2.9 08/02/2021   Lab Results  Component Value Date   NA 135 (A) 06/10/2021   K 3.5 06/10/2021   CREATININE 0.7 06/10/2021   EGFR 107 06/10/2021   MICROALBUR 20 05/10/2020   LABMICR <3.0 08/02/2021      ---------------------------------------------------------------------------------------------------   Medications: Outpatient Medications Prior to Visit  Medication Sig   albuterol (VENTOLIN HFA) 108 (90 Base) MCG/ACT inhaler Inhale 2 puffs into the lungs every 6 (six) hours as needed for wheezing or shortness of breath.   Alum & Mag Hydroxide-Simeth (GI COCKTAIL) SUSP suspension Take 30 mLs by mouth 2 (two) times daily as needed for indigestion (abd pain). Shake well. Each dose to containe 32m maalox and 174mviscous lidocaine   aspirin 81 MG tablet Take 81 mg by mouth daily.   azelastine (OPTIVAR) 0.05 % ophthalmic solution INSTILL ONE DROP TO THE AFFECTED EYE(S) TWICE A DAY   Blood Glucose Monitoring Suppl (ONE TOUCH ULTRA 2) w/Device KIT USE AS DIRECTED   citalopram (CELEXA) 40 MG tablet TAKE ONE TABLET BY MOUTH DAILY   cyclobenzaprine (FLEXERIL) 5 MG tablet TAKE 1 TABLET BY MOUTH 3 TIMES DAILY AS NEEDED MUSCLE SPASMS   glipiZIDE (GLUCOTROL XL) 5 MG 24 hr tablet Take 1 tablet (5 mg total) by mouth daily with breakfast.   glucose blood (CONTOUR TEST) test strip Check blood sugars 3 times daily. E11.65 (uncontrolled insulin dependent diabetes)   insulin aspart (NOVOLOG FLEXPEN) 100 UNIT/ML FlexPen Use before meals up to three times a day. Take 6 units if sugar is over 200. 8 units if over 250 and 10 units if over 300.   insulin detemir (LEVEMIR FLEXPEN) 100 UNIT/ML FlexPen INJECT 48 UNITS INTO THE SKIN DAILY  insulin lispro (HUMALOG KWIKPEN) 200 UNIT/ML KwikPen Use before meals up to three times a day. Take 6 units if sugar is over 200. 8 units if over 250 and 10 units if over 300.   Insulin Pen Needle 32G X 4 MM MISC 1 each by Does not apply route 4 (four) times daily -  before meals and at bedtime.   methocarbamol (ROBAXIN) 750 MG tablet Take 1 tablet (750 mg total) by mouth every 6 (six) hours as needed for muscle spasms (back pain).   nitroGLYCERIN (NITROSTAT) 0.4 MG SL tablet Place 1  tablet (0.4 mg total) under the tongue every 5 (five) minutes as needed for chest pain.   oxyCODONE (ROXICODONE) 15 MG immediate release tablet Take 1 tablet (15 mg total) by mouth every 4 (four) hours as needed for pain.   pantoprazole (PROTONIX) 40 MG tablet Take 1 tablet (40 mg total) by mouth daily.   pioglitazone (ACTOS) 30 MG tablet Take 1 tablet (30 mg total) by mouth daily.   rosuvastatin (CRESTOR) 40 MG tablet TAKE 1 TABLET BY MOUTH ONCE DAILY   sucralfate (CARAFATE) 1 g tablet Take 1 tablet (1 g total) by mouth 4 (four) times daily.   traZODone (DESYREL) 100 MG tablet TAKE 2 TABLETS BY MOUTH AT BEDTIME   triamcinolone cream (KENALOG) 0.1 % Apply 1 Application topically 2 (two) times daily.   lisinopril (ZESTRIL) 20 MG tablet Take 1 tablet (20 mg total) by mouth daily.   metoCLOPramide (REGLAN) 10 MG tablet Take 1 tablet (10 mg total) by mouth 4 (four) times daily -  before meals and at bedtime.   tirzepatide [facility]) 2.5 MG/0.5ML Pen Inject 2.5 mg into the skin once a week. (Patient not taking: Reported on 02/08/2022)   [DISCONTINUED] polyethylene glycol-electrolytes (NULYTELY) 420 g solution Prepare according to package instructions. Starting at 5:00 PM: Drink one 8 oz glass of mixture every 15 minutes until you finish half of the jug. Five hours prior to procedure, drink 8 oz glass of mixture every 15 minutes until it is all gone. Make sure you do not drink anything 4 hours prior to your procedure. (Patient not taking: Reported on 02/08/2022)   No facility-administered medications prior to visit.    Review of Systems  Constitutional:  Negative for appetite change, chills and fever.  Respiratory:  Negative for chest tightness, shortness of breath and wheezing.   Cardiovascular:  Negative for chest pain and palpitations.  Gastrointestinal:  Negative for abdominal pain, nausea and vomiting.       Objective    BP 138/71 (BP Location: Left Arm, Patient Position: Sitting, Cuff Size:  Normal)   Pulse 64   Temp 98.7 F (37.1 C) (Oral)   Resp 14   Wt 216 lb (98 kg)   SpO2 99% Comment: room air  BMI 32.84 kg/m    Physical Exam   General: Appearance:    Mildly obese male in no acute distress  Eyes:    PERRL, conjunctiva/corneas clear, EOM's intact       Lungs:     Clear to auscultation bilaterally, respirations unlabored  Heart:    Normal heart rate. Normal rhythm. No murmurs, rubs, or gallops.    MS:   All extremities are intact.    Neurologic:   Awake, alert, oriented x 3. No apparent focal neurological defect.         Results for orders placed or performed in visit on 02/08/22  POCT HgB A1C  Result Value Ref Range  Hemoglobin A1C 12.7 (A) 4.0 - 5.6 %   Est. average glucose Bld gHb Est-mCnc 318     Assessment & Plan     1. Type 2 diabetes mellitus with other specified complication, without long-term current use of insulin ([place]) He did well with trial month of [name], apparently insurnace refused to cover the second month. Send in new to initiate PA.   In the meantime, increase insulin detemir (LEVEMIR FLEXPEN) 100 UNIT/ML FlexPen; INJECT 56 UNITS INTO THE SKIN DAILY  2. Fatigue due to excessive exertion, sequela  - Comprehensive metabolic panel - TSH - CBC with Differential/Platelet  3. Chronic bilateral low back pain with sciatica, sciatica laterality unspecified Oxycodone is effective, but he has been taking extra dose occasionally exceeding his monthly allotment. Instead he can take diclofenac (VOLTAREN) 75 MG EC tablet; Take 1 tablet (75 mg total) by mouth 2 (two) times daily as needed for moderate pain.  Dispense: 30 tablet; Refill: 0  Refill cyclobenzaprine.  rate pain.  Dispense: 30 tablet; Refill: 0  4. Insomnia, unspecified type refill traZODone (DESYREL) 100 MG tablet; Take 2 tablets (200 mg total) by mouth at bedtime.  Dispense: 180 tablet; Refill: 4  5. Need for influenza vaccination  - Flu Vaccine QUAD 36+ mos IM  (Fluarix/Fluzone)  Refill - triamcinolone cream (KENALOG) 0.1 %; Apply 1 Application topically 2 (two) times daily.  Dispense: 30 g; Refill: 2       The entirety of the information documented in the History of Present Illness, Review of Systems and Physical Exam were personally obtained by me. Portions of this information were initially documented by the CMA and reviewed by me for thoroughness and accuracy.     [name], MD  [facility] [phone] (phone) [phone] (fax)  [place]

## 2022-02-09 LAB — CBC WITH DIFFERENTIAL/PLATELET
Basophils Absolute: 0 10*3/uL (ref 0.0–0.2)
Basos: 0 %
EOS (ABSOLUTE): 0.3 10*3/uL (ref 0.0–0.4)
Eos: 4 %
Hematocrit: 45 % (ref 37.5–51.0)
Hemoglobin: 14.4 g/dL (ref 13.0–17.7)
Immature Grans (Abs): 0 10*3/uL (ref 0.0–0.1)
Immature Granulocytes: 0 %
Lymphocytes Absolute: 2.1 10*3/uL (ref 0.7–3.1)
Lymphs: 30 %
MCH: 26.5 pg — ABNORMAL LOW (ref 26.6–33.0)
MCHC: 32 g/dL (ref 31.5–35.7)
MCV: 83 fL (ref 79–97)
Monocytes Absolute: 0.5 10*3/uL (ref 0.1–0.9)
Monocytes: 7 %
Neutrophils Absolute: 4.2 10*3/uL (ref 1.4–7.0)
Neutrophils: 59 %
Platelets: 241 10*3/uL (ref 150–450)
RBC: 5.44 x10E6/uL (ref 4.14–5.80)
RDW: 12.7 % (ref 11.6–15.4)
WBC: 7.1 10*3/uL (ref 3.4–10.8)

## 2022-02-09 LAB — COMPREHENSIVE METABOLIC PANEL
ALT: 20 IU/L (ref 0–44)
AST: 13 IU/L (ref 0–40)
Albumin/Globulin Ratio: 1.7 (ref 1.2–2.2)
Albumin: 4.2 g/dL (ref 3.8–4.9)
Alkaline Phosphatase: 72 IU/L (ref 44–121)
BUN/Creatinine Ratio: 14 (ref 9–20)
BUN: 13 mg/dL (ref 6–24)
Bilirubin Total: 0.3 mg/dL (ref 0.0–1.2)
CO2: 24 mmol/L (ref 20–29)
Calcium: 9.2 mg/dL (ref 8.7–10.2)
Chloride: 99 mmol/L (ref 96–106)
Creatinine, Ser: 0.96 mg/dL (ref 0.76–1.27)
Globulin, Total: 2.5 g/dL (ref 1.5–4.5)
Glucose: 301 mg/dL — ABNORMAL HIGH (ref 70–99)
Potassium: 4.7 mmol/L (ref 3.5–5.2)
Sodium: 135 mmol/L (ref 134–144)
Total Protein: 6.7 g/dL (ref 6.0–8.5)
eGFR: 92 mL/min/{1.73_m2} (ref >59)

## 2022-02-09 LAB — TSH: TSH: 1.89 u[IU]/mL (ref 0.450–4.500)

## 2022-02-10 ENCOUNTER — Ambulatory Visit: Payer: BC Managed Care – PPO | Admitting: Family Medicine

## 2022-02-13 ENCOUNTER — Other Ambulatory Visit: Payer: Self-pay

## 2022-02-13 ENCOUNTER — Ambulatory Visit: Payer: Self-pay | Admitting: *Deleted

## 2022-02-13 MED ORDER — ROSUVASTATIN CALCIUM 40 MG PO TABS: 40.0000 mg | ORAL_TABLET | Freq: Every day | ORAL | 3 refills | Status: DC

## 2022-02-13 NOTE — Patient Outreach (Signed)
  Care Coordination   02/13/2022 Name: Vincent Hall MRN: [id number] DOB: 01/23/1966   Care Coordination Outreach Attempts:  An unsuccessful telephone outreach was attempted for a scheduled appointment today.  Follow Up Plan:  Additional outreach attempts will be made to offer the patient care coordination information and services.   Encounter Outcome:  No Answer  Care Coordination Interventions Activated:  No   Care Coordination Interventions:  No, not indicated    Valente David, RN, MSN, [facility] Care Management Care Management Coordinator [phone]

## 2022-02-14 LAB — SPECIMEN STATUS REPORT

## 2022-02-14 LAB — LIPID PANEL W/O CHOL/HDL RATIO
Cholesterol, Total: 243 mg/dL — ABNORMAL HIGH (ref 100–199)
HDL: 41 mg/dL (ref >39)
LDL Chol Calc (NIH): 177 mg/dL — ABNORMAL HIGH (ref 0–99)
Triglycerides: 138 mg/dL (ref 0–149)
VLDL Cholesterol Cal: 25 mg/dL (ref 5–40)

## 2022-02-17 ENCOUNTER — Other Ambulatory Visit: Payer: Self-pay | Admitting: Family Medicine

## 2022-02-17 DIAGNOSIS — G8929 Other chronic pain: Secondary | ICD-10-CM

## 2022-02-17 DIAGNOSIS — M545 Low back pain, unspecified: Secondary | ICD-10-CM

## 2022-02-17 DIAGNOSIS — R0789 Other chest pain: Secondary | ICD-10-CM

## 2022-02-17 MED ORDER — OXYCODONE HCL 15 MG PO TABS: 15.0000 mg | ORAL_TABLET | ORAL | 0 refills | Status: DC | PRN

## 2022-02-17 NOTE — Telephone Encounter (Signed)
Medication Refill - Medication: oxyCODONE (ROXICODONE) 15 MG immediate release tablet [[id number]]   Has the patient contacted their pharmacy? No. (Agent: If no, request that the patient contact the pharmacy for the refill. If patient does not wish to contact the pharmacy document the reason why and proceed with request.) (Agent: If yes, when and what did the pharmacy advise?)  Preferred Pharmacy (with phone number or street name): [facility] [name], [place]  [place], [place] [address]  Has the patient been seen for an appointment in the last year OR does the patient have an upcoming appointment? Yes.    Agent: Please be advised that RX refills may take up to 3 business days. We ask that you follow-up with your pharmacy.

## 2022-02-17 NOTE — Telephone Encounter (Signed)
Requested medication (s) are due for refill today:   Provider to review  Requested medication (s) are on the active medication list:   Yes  Future visit scheduled:   Yes   Last ordered: 01/20/2022 #180, 0 refills  Non delegated refill reason returned.   Requested Prescriptions  Pending Prescriptions Disp Refills   oxyCODONE (ROXICODONE) 15 MG immediate release tablet 180 tablet 0    Sig: Take 1 tablet (15 mg total) by mouth every 4 (four) hours as needed for pain.     Not Delegated - Analgesics:  Opioid Agonists Failed - 02/17/2022  1:18 PM      Failed - This refill cannot be delegated      Failed - Urine Drug Screen completed in last 360 days      Passed - Valid encounter within last 3 months    Recent Outpatient Visits           1 week ago Type 2 diabetes mellitus with other specified complication, without long-term current use of insulin [facility])   [facility] Birdie Sons, MD   2 months ago Stomach irritation   [facility] Birdie Sons, MD   4 months ago Uncontrolled type 2 diabetes mellitus with hyperglycemia [facility])   [facility] Birdie Sons, MD   6 months ago Uncontrolled type 2 diabetes mellitus with hyperglycemia [facility])   [facility] Birdie Sons, MD   8 months ago Intermittent abdominal pain   [facility] Birdie Sons, MD       Future Appointments             In 2 months Fisher, Kirstie Peri, MD [facility], [place]

## 2022-02-22 ENCOUNTER — Telehealth: Payer: Self-pay | Admitting: Pharmacist

## 2022-02-22 NOTE — Progress Notes (Signed)
[name])                                            [name]    02/22/2022  Vincent Hall Aug 19, 1965 [id number]   Patient called me to help with [name] because I sent him drug manufacturer coupons to help with the cost a couple of months ago. HIPAA identifiers were obtained.  Patient said the coupons worked the first month but now the insurance wants a new prescription for [name] '5mg'$  vs the 2.'5mg'$  to be sent to his local pharmacy.  [name] 2.'5mg'$  is still on the patient's medication list and a new script does not appear to have been sent.  Plan: Send a request to the patient's physician requesting a new script be sent to the patient's pharmacy for [name] '5mg'$  if deemed therapeutically appropriate. Follow up in 3-5 business days.  [name], PharmD, [place] Clinical Pharmacist [phone]

## 2022-02-24 ENCOUNTER — Telehealth: Payer: Self-pay | Admitting: Family Medicine

## 2022-02-24 MED ORDER — TIRZEPATIDE 5 MG/0.5ML ~~LOC~~ SOAJ: 5.0000 mg | SUBCUTANEOUS | 3 refills | Status: DC

## 2022-02-24 NOTE — Telephone Encounter (Signed)
[name] Received: 4 days ago [name], [name], CMA  Birdie Sons, MD I called this pt to schedule a f/u with RNCM and pt asked that I send you a message about the [facility], says he needs this increased from 2.5 mg to '5mg'$  according to pharmacy and insurance to cover costs. Thanks  Julian Hy, [place] Direct Dial: [phone]

## 2022-02-28 ENCOUNTER — Ambulatory Visit: Payer: Self-pay | Admitting: *Deleted

## 2022-02-28 NOTE — Patient Outreach (Signed)
Care Coordination   Follow Up Visit Note   03/01/2022 Name: Vincent Hall MRN: [id number] DOB: 02-09-1966  Vincent Hall is a 56 y.o. year old male who sees Fisher, Kirstie Peri, MD for primary care. I spoke with  Vincent Hall by phone today.  What matters to the patients health and wellness today?  Ongoing pain management as well as DM management.    Goals Addressed             This Visit's Progress    RNCM: Effective Management of DM   On track    Care Coordination Interventions:  Lab Results  Component Value Date   HGBA1C 12.7 (A) 02/08/2022    Provided education to patient about basic DM disease process Reviewed medications with patient and discussed importance of medication adherence. The patient is having cost constraint issues with affordability of medications and sometimes goes without. Is receptive to a pharmacy consult. Counseled on importance of regular laboratory monitoring as prescribed.  A1C has decreased slightly, noted above Discussed plans with patient for ongoing care management follow up and provided patient with direct contact information for care management team Provided patient with written educational materials related to hypo and hyperglycemia and importance of correct treatment Reviewed scheduled/upcoming provider appointments including: recommended that the patient call and get an appointment with pcp for review of DM and new A1C.  Next PCP appointment is 1/12 Advised patient, providing education and rationale, to check cbg BID and record, calling pcp for findings outside established parameters. The patient is checking his blood sugars bid. The patient states it is better, ranging 60s-300. Review with the patient the need of fasting blood sugars <130 and post <180. Referral made to pharmacy team for assistance with Novolog and Monjourno.  Collaborated with pharmacy team requesting information for Novolog assistance be sent again as he accidentally  deleted the information Review of patient status, including review of consultants reports, relevant laboratory and other test results, and medications completed The patient is receptive to working with the [facility] for help with management of chronic conditions. Review of the goals of the program.        RNCM: Effective Management of Pain   On track    Care Coordination Interventions: Reviewed provider established plan for pain management Discussed importance of adherence to all scheduled medical appointments Counseled on the importance of reporting any/all new or changed pain symptoms or management strategies to pain management provider Advised patient to report to care team affect of pain on daily activities Discussed use of relaxation techniques and/or diversional activities to assist with pain reduction (distraction, imagery, relaxation, massage, acupressure, TENS, heat, and cold application Reviewed with patient prescribed pharmacological and nonpharmacological pain relief strategies Advised patient to discuss unresolved pain or changes in level or intensity of pain with provider The patient states he feels like his pain medications are not effective anymore that he has been taking them too long. States his pain was not bad today because he had taken his pain medications about an hour before the call, rating 0/10. The patient ask if he could get more pain medications. Recommendation to see pain specialist, he is reluctant stating he has done this before and it didn't help.  He has been on Gabapentin in the past, but this made his depression  worse.  He is wondering if there are any other options to discuss with provider.  Collaborated with [facility] pharmacist to inquire about patient's concern.  SDOH assessments and interventions completed:  No     Care Coordination Interventions Activated:  Yes  Care Coordination Interventions:  Yes, provided   Follow up plan: Follow up call  scheduled for 12/18    Encounter Outcome:  Pt. Visit Completed   Vincent David, RN, MSN, [place] Care Management Care Management Coordinator [phone]

## 2022-03-01 NOTE — Patient Instructions (Signed)
Visit Information  Thank you for taking time to visit with me today. Please don't hesitate to contact me if I can be of assistance to you before our next scheduled telephone appointment.  Following are the goals we discussed today:  Complete application for Novolog assistance. Continue monitoring blood sugar, record readings to share with provider.  Our next appointment is by telephone on 12/18  Please call the care guide team at [phone] if you need to cancel or reschedule your appointment.   Please call the Suicide and Crisis Lifeline: 988 call the [place] National Suicide Prevention Lifeline: 418-403-8779 or TTY: 534-715-7603 TTY 575-037-9226) to talk to a trained counselor call 1-800-273-TALK (toll free, 24 hour hotline) call 911 if you are experiencing a Mental Health or [place] or need someone to talk to.  Patient verbalizes understanding of instructions and care plan provided today and agrees to view in [place]. Active MyChart status and patient understanding of how to access instructions and care plan via MyChart confirmed with patient.     The patient has been provided with contact information for the care management team and has been advised to call with any health related questions or concerns.   Valente David, RN, MSN, [place] Care Management Care Management Coordinator [phone]

## 2022-03-13 ENCOUNTER — Emergency Department: Payer: BC Managed Care – PPO

## 2022-03-13 ENCOUNTER — Emergency Department
Admission: EM | Admit: 2022-03-13 | Discharge: 2022-03-13 | Disposition: A | Discharge status: Home / Self Care | Payer: BC Managed Care – PPO | Attending: Emergency Medicine | Admitting: Emergency Medicine

## 2022-03-13 ENCOUNTER — Ambulatory Visit: Payer: Self-pay | Admitting: *Deleted

## 2022-03-13 ENCOUNTER — Other Ambulatory Visit: Payer: Self-pay

## 2022-03-13 ENCOUNTER — Encounter: Payer: Self-pay | Admitting: Emergency Medicine

## 2022-03-13 DIAGNOSIS — E119 Type 2 diabetes mellitus without complications: Secondary | ICD-10-CM | POA: Diagnosis not present

## 2022-03-13 DIAGNOSIS — J45909 Unspecified asthma, uncomplicated: Secondary | ICD-10-CM | POA: Diagnosis not present

## 2022-03-13 DIAGNOSIS — R319 Hematuria, unspecified: Secondary | ICD-10-CM | POA: Diagnosis not present

## 2022-03-13 DIAGNOSIS — R109 Unspecified abdominal pain: Secondary | ICD-10-CM | POA: Diagnosis not present

## 2022-03-13 DIAGNOSIS — R1084 Generalized abdominal pain: Secondary | ICD-10-CM | POA: Insufficient documentation

## 2022-03-13 DIAGNOSIS — R112 Nausea with vomiting, unspecified: Secondary | ICD-10-CM | POA: Diagnosis not present

## 2022-03-13 DIAGNOSIS — I2510 Atherosclerotic heart disease of native coronary artery without angina pectoris: Secondary | ICD-10-CM | POA: Insufficient documentation

## 2022-03-13 DIAGNOSIS — J111 Influenza due to unidentified influenza virus with other respiratory manifestations: Secondary | ICD-10-CM | POA: Diagnosis not present

## 2022-03-13 DIAGNOSIS — Z20822 Contact with and (suspected) exposure to covid-19: Secondary | ICD-10-CM | POA: Diagnosis not present

## 2022-03-13 DIAGNOSIS — Z951 Presence of aortocoronary bypass graft: Secondary | ICD-10-CM | POA: Diagnosis not present

## 2022-03-13 DIAGNOSIS — F17200 Nicotine dependence, unspecified, uncomplicated: Secondary | ICD-10-CM | POA: Insufficient documentation

## 2022-03-13 DIAGNOSIS — K760 Fatty (change of) liver, not elsewhere classified: Secondary | ICD-10-CM | POA: Diagnosis not present

## 2022-03-13 DIAGNOSIS — I959 Hypotension, unspecified: Secondary | ICD-10-CM | POA: Diagnosis not present

## 2022-03-13 LAB — COMPREHENSIVE METABOLIC PANEL
ALT: 28 U/L (ref 0–44)
AST: 23 U/L (ref 15–41)
Albumin: 4.3 g/dL (ref 3.5–5.0)
Alkaline Phosphatase: 56 U/L (ref 38–126)
Anion gap: 13 (ref 5–15)
BUN: 15 mg/dL (ref 6–20)
CO2: 25 mmol/L (ref 22–32)
Calcium: 8.7 mg/dL — ABNORMAL LOW (ref 8.9–10.3)
Chloride: 98 mmol/L (ref 98–111)
Creatinine, Ser: 0.87 mg/dL (ref 0.61–1.24)
GFR, Estimated: >60 mL/min (ref >60)
Glucose, Bld: 259 mg/dL — ABNORMAL HIGH (ref 70–99)
Potassium: 3.3 mmol/L — ABNORMAL LOW (ref 3.5–5.1)
Sodium: 136 mmol/L (ref 135–145)
Total Bilirubin: 1.1 mg/dL (ref 0.3–1.2)
Total Protein: 7.6 g/dL (ref 6.5–8.1)

## 2022-03-13 LAB — URINALYSIS, ROUTINE W REFLEX MICROSCOPIC
Bacteria, UA: NONE SEEN
Bilirubin Urine: NEGATIVE
Glucose, UA: >=500 mg/dL — AB
Ketones, ur: 80 mg/dL — AB
Leukocytes,Ua: NEGATIVE
Nitrite: NEGATIVE
Protein, ur: 100 mg/dL — AB
RBC / HPF: >50 RBC/hpf — ABNORMAL HIGH (ref 0–5)
Specific Gravity, Urine: 1.028 (ref 1.005–1.030)
Squamous Epithelial / HPF: NONE SEEN (ref 0–5)
pH: 6 (ref 5.0–8.0)

## 2022-03-13 LAB — RESP PANEL BY RT-PCR (FLU A&B, COVID) ARPGX2
Influenza A by PCR: NEGATIVE
Influenza B by PCR: NEGATIVE
SARS Coronavirus 2 by RT PCR: NEGATIVE

## 2022-03-13 LAB — CBC
HCT: 46.4 % (ref 39.0–52.0)
Hemoglobin: 15.3 g/dL (ref 13.0–17.0)
MCH: 26.3 pg (ref 26.0–34.0)
MCHC: 33 g/dL (ref 30.0–36.0)
MCV: 79.9 fL — ABNORMAL LOW (ref 80.0–100.0)
Platelets: 271 10*3/uL (ref 150–400)
RBC: 5.81 MIL/uL (ref 4.22–5.81)
RDW: 12.6 % (ref 11.5–15.5)
WBC: 11.7 10*3/uL — ABNORMAL HIGH (ref 4.0–10.5)
nRBC: 0 % (ref 0.0–0.2)

## 2022-03-13 LAB — LIPASE, BLOOD: Lipase: 30 U/L (ref 11–51)

## 2022-03-13 MED ORDER — LACTATED RINGERS IV BOLUS
1000.0000 mL | Freq: Once | INTRAVENOUS | Status: AC
  Administered 2022-03-13: 1000 mL via INTRAVENOUS

## 2022-03-13 MED ORDER — DROPERIDOL 2.5 MG/ML IJ SOLN
2.5000 mg | Freq: Once | INTRAMUSCULAR | Status: AC
  Administered 2022-03-13: 2.5 mg via INTRAVENOUS
  Filled 2022-03-13: qty 2

## 2022-03-13 MED ORDER — IOHEXOL 300 MG/ML  SOLN
100.0000 mL | Freq: Once | INTRAMUSCULAR | Status: AC | PRN
  Administered 2022-03-13: 100 mL via INTRAVENOUS

## 2022-03-13 MED ORDER — HYDROMORPHONE HCL 1 MG/ML IJ SOLN
0.5000 mg | Freq: Once | INTRAMUSCULAR | Status: AC
  Administered 2022-03-13: 0.5 mg via INTRAVENOUS
  Filled 2022-03-13: qty 0.5

## 2022-03-13 MED ORDER — OXYCODONE HCL 5 MG PO TABS: 5.0000 mg | ORAL_TABLET | Freq: Three times a day (TID) | ORAL | 0 refills | Status: DC | PRN

## 2022-03-13 MED ORDER — FAMOTIDINE IN NACL 20-0.9 MG/50ML-% IV SOLN
20.0000 mg | Freq: Once | INTRAVENOUS | Status: AC
  Administered 2022-03-13: 20 mg via INTRAVENOUS
  Filled 2022-03-13: qty 50

## 2022-03-13 MED ORDER — ONDANSETRON HCL 4 MG/2ML IJ SOLN
4.0000 mg | Freq: Once | INTRAMUSCULAR | Status: AC
  Administered 2022-03-13: 4 mg via INTRAVENOUS
  Filled 2022-03-13: qty 2

## 2022-03-13 MED ORDER — ALUM & MAG HYDROXIDE-SIMETH 200-200-20 MG/5ML PO SUSP
30.0000 mL | Freq: Once | ORAL | Status: AC
  Administered 2022-03-13: 30 mL via ORAL
  Filled 2022-03-13: qty 30

## 2022-03-13 MED ORDER — CEPHALEXIN 500 MG PO CAPS: 500.0000 mg | ORAL_CAPSULE | Freq: Three times a day (TID) | ORAL | 0 refills | Status: AC

## 2022-03-13 NOTE — Telephone Encounter (Signed)
FYI

## 2022-03-13 NOTE — ED Provider Triage Note (Signed)
Emergency Medicine Provider Triage Evaluation Note  Vincent Hall , a 56 y.o. male  was evaluated in triage.  Pt complains of abdominal pain x 2 days. Last BM 2 days ago.  History of abdominal problems.   Review of Systems  Positive: + Generalized abdominal pain.  Negative: No N/V/D, no fever, chills  Physical Exam  BP (!) 160/75   Pulse 78   Temp 98.3 F (36.8 C)   Resp 16   Ht '5\' 8"'$  (1.727 m)   Wt 98 kg   SpO2 97%   BMI 32.85 kg/m  Gen:   Awake, no distress   Resp:  Normal effort  MSK:   Moves extremities without difficulty  Other:  Abdominal with generalized tenderness throughout.  BS minimal at present.   Medical Decision Making  Medically screening exam initiated at 10:50 AM.  Appropriate orders placed.  Vincent Hall was informed that the remainder of the evaluation will be completed by another provider, this initial triage assessment does not replace that evaluation, and the importance of remaining in the ED until their evaluation is complete.     Johnn Hai, PA-C 03/13/22 1052

## 2022-03-13 NOTE — Telephone Encounter (Signed)
  Chief Complaint: vomiting- dehydration  Symptoms: vomiting, abdominal pain, weakness Frequency: symptoms started Saturday Pertinent Negatives: Patient denies fever, headache, vertigo, vomiting blood or coffee grounds, recent head injury Disposition: '[x]'$ ED /'[]'$ Urgent Care (no appt availability in office) / '[]'$ Appointment(In office/virtual)/ '[]'$  [place] [facility]/ '[]'$ Home Care/ '[]'$ Refused Recommended Disposition /'[]'$ [place] [facility]/ '[]'$  Follow-up with PCP Additional Notes: Patient requested EMS/911- he is weak and has no transportation- EMS called

## 2022-03-13 NOTE — Telephone Encounter (Signed)
Summary: Vomiting   Patient experiencing vomiting for 3 days,caller states BP may be high due to face being hot and sweating.  Please call patient at [phone].         Reason for Disposition  [1] SEVERE vomiting (e.g., 6 or more times/day) AND [2] present > 8 hours (Exception: Patient sounds well, is drinking liquids, does not sound dehydrated, and vomiting has lasted less than 24 hours.)  Answer Assessment - Initial Assessment Questions 1. VOMITING SEVERITY: "How many times have you vomited in the past 24 hours?"     - MILD:  1 - 2 times/day    - MODERATE: 3 - 5 times/day, decreased oral intake without significant weight loss or symptoms of dehydration    - SEVERE: 6 or more times/day, vomits everything or nearly everything, with significant weight loss, symptoms of dehydration      severe 2. ONSET: "When did the vomiting begin?"      Saturday 3. FLUIDS: "What fluids or food have you vomited up today?" "Have you been able to keep any fluids down?"     No fluids 4. ABDOMEN PAIN: "Are your having any abdomen pain?" If Yes : "How bad is it and what does it feel like?" (e.g., crampy, dull, intermittent, constant)      Yes- midline 5. DIARRHEA: "Is there any diarrhea?" If Yes, ask: "How many times today?"      When started- ended with soft stool- but not in 1-2 days 6. CONTACTS: "Is there anyone else in the family with the same symptoms?"      Cat has been sick 7. CAUSE: "What do you think is causing your vomiting?"     unsure 8. HYDRATION STATUS: "Any signs of dehydration?" (e.g., dry mouth [not only dry lips], too weak to stand) "When did you last urinate?"     Weakness. Dips- urinated 9. OTHER SYMPTOMS: "Do you have any other symptoms?" (e.g., fever, headache, vertigo, vomiting blood or coffee grounds, recent head injury)     BP- 183/80 10. PREGNANCY: "Is there any chance you are pregnant?" "When was your last menstrual period?"       na  Protocols used: Vomiting-A-AH

## 2022-03-13 NOTE — ED Provider Notes (Signed)
[facility] Provider Note    Event Date/Time   First MD Initiated Contact with Patient 03/13/22 1139     (approximate)   History   flu like symptoms   HPI  Vincent Hall is a 56 y.o. male with bipolar disorder coronary disease status post STEMI asthma anxiety who presents with abdominal pain nausea vomiting.  Symptoms started several days ago this weekend.  Has had more episodes of emesis that he can count nonbloody.  Having normal bowel movements no diarrhea.  He complains of diffuse abdominal pain that is rather constant.  Says it feels similar to his chronic abdominal pain.  Denies shortness of breath cough congestion.  Has had some bodyaches.  He has not been able to tolerate p.o.  Says he has chronic abdominal pain this does feel somewhat worse but similar to episodes where he has had to come to the emergency department before    Past Medical History:  Diagnosis Date   Anxiety    Asthma    Bipolar 2 disorder ([place])    Coronary artery disease    Depression    Diabetes ([place])    Fatty liver    History of MI (myocardial infarction)    STEMI (ST elevation myocardial infarction) ([place]) 06/22/2013    Patient Active Problem List   Diagnosis Date Noted   DKA (diabetic ketoacidosis) ([place]) 01/24/2021   Aortic atherosclerosis ([place]) 09/23/2020   Emphysema lung ([place]) 09/23/2020   OSA (obstructive sleep apnea) 06/23/2020   Vitamin D deficiency 05/11/2020   Narcotic dependence ([place]) 05/10/2020   Homicidal ideation 02/10/2020   Opioid abuse ([place]) 02/09/2020   PTSD (post-traumatic stress disorder) 02/09/2020   Nonallopathic lesion of sacral region 01/15/2020   Nonallopathic lesion of lumbar region 01/15/2020   Chronic bilateral low back pain with sciatica 09/05/2019   Stomach irritation    History of colonic polyps    Colonic edema    Internal hemorrhoids    Pulmonary nodule 10/03/2018   Type 2 diabetes mellitus with other specified complication ([place])  40/81/4481   Fatty liver 03/28/2018   Left hip pain 03/26/2018   Chronic left SI joint pain 03/26/2018   Hx of CABG (chronic thoracic/chest wall pain) 03/26/2018   Bipolar disorder ([place]) 01/25/2018   Suicide attempt ([place]) 01/08/2018   Benzodiazepine overdose 01/08/2018   Severe recurrent major depression without psychotic features ([place]) 01/08/2018   Chronic pain 01/08/2018   Thrush, oral 12/03/2017   Current moderate episode of major depressive disorder without prior episode ([place]) 07/26/2017   Anxiety 10/28/2014   Arthralgia 10/28/2014   Chest wall pain 10/28/2014   Dysphagia 10/28/2014   Fatigue 10/28/2014   History of tobacco use 10/28/2014   Hyperlipidemia, mixed 10/28/2014   Insomnia 10/28/2014   Chronic idiopathic constipation 09/18/2014   Esophageal reflux 09/18/2014   Esophageal spasm 09/18/2014   Coronary artery disease 07/07/2013   Allergic rhinitis 06/18/2008     Physical Exam  Triage Vital Signs: ED Triage Vitals  Enc Vitals Group     BP 03/13/22 1046 (!) 160/75     Pulse Rate 03/13/22 1046 78     Resp 03/13/22 1046 16     Temp 03/13/22 1046 98.3 F (36.8 C)     Temp src --      SpO2 03/13/22 1046 97 %     Weight 03/13/22 0958 216 lb 0.8 oz (98 kg)     Height 03/13/22 0958 '5\' 8"'$  (1.727 m)  Head Circumference --      Peak Flow --      Pain Score 03/13/22 0958 0     Pain Loc --      Pain Edu? --      Excl. in [place]? --     Most recent vital signs: Vitals:   03/13/22 1046 03/13/22 1359  BP: (!) 160/75 (!) 158/70  Pulse: 78 70  Resp: 16 16  Temp: 98.3 F (36.8 C)   SpO2: 97% 98%     General: Awake, no distress.  CV:  Good peripheral perfusion.  Resp:  Normal effort.  Abd:  No distention.  Mild tenderness in the upper abdomen without guarding Neuro:             Awake, Alert, Oriented x 3  Other:     ED Results / Procedures / Treatments  Labs (all labs ordered are listed, but only abnormal results are displayed) Labs Reviewed   COMPREHENSIVE METABOLIC PANEL - Abnormal; Notable for the following components:      Result Value   Potassium 3.3 (*)    Glucose, Bld 259 (*)    Calcium 8.7 (*)    All other components within normal limits  CBC - Abnormal; Notable for the following components:   WBC 11.7 (*)    MCV 79.9 (*)    All other components within normal limits  URINALYSIS, ROUTINE W REFLEX MICROSCOPIC - Abnormal; Notable for the following components:   Color, Urine YELLOW (*)    APPearance CLEAR (*)    Glucose, UA >=500 (*)    Hgb urine dipstick LARGE (*)    Ketones, ur 80 (*)    Protein, ur 100 (*)    RBC / HPF >50 (*)    All other components within normal limits  RESP PANEL BY RT-PCR (FLU A&B, COVID) ARPGX2  LIPASE, BLOOD     EKG     RADIOLOGY    PROCEDURES:  Critical Care performed: No  Procedures   MEDICATIONS ORDERED IN ED: Medications  alum & mag hydroxide-simeth (MAALOX/MYLANTA) 200-200-20 MG/5ML suspension 30 mL (has no administration in time range)  droperidol (INAPSINE) 2.5 MG/ML injection 2.5 mg (has no administration in time range)  famotidine (PEPCID) IVPB 20 mg premix (0 mg Intravenous Stopped 03/13/22 1304)  ondansetron (ZOFRAN) injection 4 mg (4 mg Intravenous Given 03/13/22 1234)  lactated ringers bolus 1,000 mL (1,000 mLs Intravenous New Bag/Given 03/13/22 1234)  HYDROmorphone (DILAUDID) injection 0.5 mg (0.5 mg Intravenous Given 03/13/22 1355)     IMPRESSION / MDM / ASSESSMENT AND PLAN / ED COURSE  I reviewed the triage vital signs and the nursing notes.                              Patient's presentation is most consistent with acute complicated illness / injury requiring diagnostic workup.  Differential diagnosis includes, but is not limited to, gastritis, pancreatitis, biliary colic, GERD, influenza-like illness  Patient is a 56 year old male who presents with nausea vomiting abdominal pain.  He has chronic abdominal pain has had this pain multiple times before.   He is having multiple episodes of vomiting not able to tolerate p.o.  Abdominal pain is diffuse in nature.  He is hypertensive here but vitals otherwise reassuring.  On abdominal exam he is tender in the epigastric region bilateral upper quadrants without guarding abdomen is soft.  Patient has had CT abdomen pelvis several times before in  this ED and they were without acute findings.  Plan to treat supportively with Zofran Pepcid and a liter of fluid.  If patient feeling improved and tolerating p.o. can likely avoid abdominal imaging given this is similar to prior episodes however if    Patient no longer vomiting but is complaining of ongoing abdominal pain.  Will obtain a CT of the abdomen pelvis.  Patient was given 0.5 mg of Dilaudid requesting more pain medication.  Will give GI cocktail and droperidol.  Signed out to oncoming provider pending imaging results.   FINAL CLINICAL IMPRESSION(S) / ED DIAGNOSES   Final diagnoses:  Abdominal pain, unspecified abdominal location  Hematuria, unspecified type     Rx / DC Orders   ED Discharge Orders     None        Note:  This document was prepared using Dragon voice recognition software and may include unintentional dictation errors.   Rada Hay, MD 03/13/22 1434

## 2022-03-13 NOTE — ED Triage Notes (Signed)
First Nurse Note:  Arrives from home via [facility].  C/O flu like symptoms since Saturday,  N/F/D lightheadedness.  Temp 101 last night  tylenol taken at 0100.  VS wnl

## 2022-03-13 NOTE — ED Provider Notes (Signed)
Patient received in signout from Dr. Starleen Blue pending follow-up CT imaging.  CT imaging without acute abnormality.  States he does have some dysuria therefore will treat for cystitis as well as give referral to outpatient workup.  Patient stating that he is having persistent nausea and abdominal pain.  States that he ran out of his pain medications over the weekend and is not can be to get a refill until Friday.  PMP checked with last prescription filled almost a month ago.  Will give him prescription for 12 hours of pain medication so he can follow-up with PCP regarding for the prescription.  He does not require hospitalization.   Merlyn Lot, MD 03/13/22 1622

## 2022-03-14 ENCOUNTER — Ambulatory Visit: Payer: Self-pay

## 2022-03-14 ENCOUNTER — Other Ambulatory Visit: Payer: Self-pay | Admitting: Family Medicine

## 2022-03-14 DIAGNOSIS — G8929 Other chronic pain: Secondary | ICD-10-CM

## 2022-03-14 DIAGNOSIS — R0789 Other chest pain: Secondary | ICD-10-CM

## 2022-03-14 NOTE — Telephone Encounter (Signed)
Pt called back. He states that dr. Caryn Section has not called in any medication.

## 2022-03-14 NOTE — Telephone Encounter (Unsigned)
Medication Refill - Medication: oxyCODONE (ROXICODONE) 15 MG immediate release tablet   Has the patient contacted their pharmacy? No. (Agent: If no, request that the patient contact the pharmacy for the refill. If patient does not wish to contact the pharmacy document the reason why and proceed with request.) (Agent: If yes, when and what did the pharmacy advise?)  Preferred Pharmacy (with phone number or street name): [facility] [name], [place]  Has the patient been seen for an appointment in the last year OR does the patient have an upcoming appointment? Yes.    Agent: Please be advised that RX refills may take up to 3 business days. We ask that you follow-up with your pharmacy.

## 2022-03-14 NOTE — Telephone Encounter (Signed)
Requested medication (s) are due for refill today:  Requested medication (s) are on the active medication list: yes  Last refill:  03/14/22  Future visit scheduled:yes  Notes to clinic:  Unable to refill per protocol, last refill by  provider 03/14/22, unable to refuse duplicate request.     Requested Prescriptions  Pending Prescriptions Disp Refills   oxyCODONE (ROXICODONE) 15 MG immediate release tablet 180 tablet 0    Sig: Take 1 tablet (15 mg total) by mouth every 4 (four) hours as needed for pain.     Not Delegated - Analgesics:  Opioid Agonists Failed - 03/14/2022  1:31 PM      Failed - This refill cannot be delegated      Failed - Urine Drug Screen completed in last 360 days      Passed - Valid encounter within last 3 months    Recent Outpatient Visits           1 month ago Type 2 diabetes mellitus with other specified complication, without long-term current use of insulin [facility])   [facility] Birdie Sons, MD   3 months ago Stomach irritation   [facility] Birdie Sons, MD   5 months ago Uncontrolled type 2 diabetes mellitus with hyperglycemia [facility])   [facility] Birdie Sons, MD   7 months ago Uncontrolled type 2 diabetes mellitus with hyperglycemia [facility])   [facility] Birdie Sons, MD   9 months ago Intermittent abdominal pain   [facility] Birdie Sons, MD       Future Appointments             In 3 days Fisher, Kirstie Peri, MD [facility], [place]   In 1 month Fisher, Kirstie Peri, MD [facility], [place]

## 2022-03-14 NOTE — Telephone Encounter (Signed)
Patients wife called in , asking for med, for vomiting to be sent , until his appt 12/08 to pharmacy, at  [address], [place] [address] ([place])  Phone [phone]  Pt. Requests [facility] phone number [phone]   Chief Complaint: Vomiting 1-3 times day Symptoms: Above Frequency: Started Saturday Pertinent Negatives: Patient denies fever Disposition: '[]'$ ED /'[]'$ Urgent Care (no appt availability in office) / '[]'$ Appointment(In office/virtual)/ '[]'$  [place] [facility]/ '[]'$ Home Care/ '[]'$ Refused Recommended Disposition /'[]'$ [place] [facility]/ '[x]'$  Follow-up with PCP Additional Notes: Requests medication for vomiting be sent to [place]. Has appointment Friday. Answer Assessment - Initial Assessment Questions 1. VOMITING SEVERITY: "How many times have you vomited in the past 24 hours?"     - MILD:  1 - 2 times/day    - MODERATE: 3 - 5 times/day, decreased oral intake without significant weight loss or symptoms of dehydration    - SEVERE: 6 or more times/day, vomits everything or nearly everything, with significant weight loss, symptoms of dehydration      3 2. ONSET: "When did the vomiting begin?"      Saturday 3. FLUIDS: "What fluids or food have you vomited up today?" "Have you been able to keep any fluids down?"     Yes 4. ABDOMEN PAIN: "Are your having any abdomen pain?" If Yes : "How bad is it and what does it feel like?" (e.g., crampy, dull, intermittent, constant)      Burning 5. DIARRHEA: "Is there any diarrhea?" If Yes, ask: "How many times today?"      No 6. CONTACTS: "Is there anyone else in the family with the same symptoms?"      No 7. CAUSE: "What do you think is causing your vomiting?"     Unsure 8. HYDRATION STATUS: "Any signs of dehydration?" (e.g., dry mouth [not only dry lips], too weak to stand) "When did you last urinate?"     No 9. OTHER SYMPTOMS: "Do you have any other symptoms?" (e.g., fever, headache, vertigo,  vomiting blood or coffee grounds, recent head injury)      10. PREGNANCY: "Is there any chance you are pregnant?" "When was your last menstrual period?"       N/a  Protocols used: Vomiting-A-AH

## 2022-03-15 ENCOUNTER — Telehealth: Payer: Self-pay

## 2022-03-15 DIAGNOSIS — R9431 Abnormal electrocardiogram [ECG] [EKG]: Secondary | ICD-10-CM | POA: Diagnosis not present

## 2022-03-15 DIAGNOSIS — Z7982 Long term (current) use of aspirin: Secondary | ICD-10-CM | POA: Diagnosis not present

## 2022-03-15 DIAGNOSIS — E785 Hyperlipidemia, unspecified: Secondary | ICD-10-CM | POA: Diagnosis not present

## 2022-03-15 DIAGNOSIS — I10 Essential (primary) hypertension: Secondary | ICD-10-CM | POA: Diagnosis not present

## 2022-03-15 DIAGNOSIS — F319 Bipolar disorder, unspecified: Secondary | ICD-10-CM | POA: Diagnosis not present

## 2022-03-15 DIAGNOSIS — R1013 Epigastric pain: Secondary | ICD-10-CM | POA: Diagnosis not present

## 2022-03-15 DIAGNOSIS — R112 Nausea with vomiting, unspecified: Secondary | ICD-10-CM | POA: Diagnosis not present

## 2022-03-15 DIAGNOSIS — R1010 Upper abdominal pain, unspecified: Secondary | ICD-10-CM | POA: Diagnosis not present

## 2022-03-15 DIAGNOSIS — Z79899 Other long term (current) drug therapy: Secondary | ICD-10-CM | POA: Diagnosis not present

## 2022-03-15 DIAGNOSIS — R1012 Left upper quadrant pain: Secondary | ICD-10-CM | POA: Diagnosis not present

## 2022-03-15 DIAGNOSIS — E119 Type 2 diabetes mellitus without complications: Secondary | ICD-10-CM | POA: Diagnosis not present

## 2022-03-15 DIAGNOSIS — I2510 Atherosclerotic heart disease of native coronary artery without angina pectoris: Secondary | ICD-10-CM | POA: Diagnosis not present

## 2022-03-15 MED ORDER — OXYCODONE HCL 15 MG PO TABS: 15.0000 mg | ORAL_TABLET | ORAL | 0 refills | Status: DC | PRN

## 2022-03-15 MED ORDER — PROMETHAZINE HCL 25 MG PO TABS: 25.0000 mg | ORAL_TABLET | Freq: Three times a day (TID) | ORAL | 0 refills | Status: DC | PRN

## 2022-03-15 NOTE — Telephone Encounter (Signed)
He already has appt scheduled for 12-8

## 2022-03-15 NOTE — Telephone Encounter (Signed)
Copied from [place] [id number]. Topic: Appointment Scheduling - Scheduling Inquiry for Clinic >> Mar 14, 2022 11:23 AM Chapman Fitch wrote: Reason for CRM: Pt needs a hosp f/u app / Dr. Caryn Section didn't have an opening until 12.22.23 and pt wants to ask if he would see him sooner than that / pt declined appt with other providers / please advise

## 2022-03-15 NOTE — Addendum Note (Signed)
Addended by: Birdie Sons on: 03/15/2022 10:21 AM   Modules accepted: Orders

## 2022-03-16 ENCOUNTER — Ambulatory Visit: Payer: Self-pay | Admitting: *Deleted

## 2022-03-16 NOTE — Progress Notes (Deleted)
Established patient visit   Patient: Vincent Hall   DOB: 1965-10-28   56 y.o. Male  MRN: [id number] Visit Date: 03/17/2022  Today's healthcare provider: [name], MD   No chief complaint on file.  Subjective    HPI  Follow up ER visit  Patient was seen in ER for flu like symptoms on 03/13/2022. He was treated for; abdominal pain and hematuria. Treatment for this included see notes in chart. He reports {DESC; EXCELLENT/GOOD/FAIR:19992} compliance with treatment. He reports this condition is {improved/worse/unchanged:3041574}.  -----------------------------------------------------------------------------------------   Medications: Outpatient Medications Prior to Visit  Medication Sig   albuterol (VENTOLIN HFA) 108 (90 Base) MCG/ACT inhaler Inhale 2 puffs into the lungs every 6 (six) hours as needed for wheezing or shortness of breath.   Alum & Mag Hydroxide-Simeth (GI COCKTAIL) SUSP suspension Take 30 mLs by mouth 2 (two) times daily as needed for indigestion (abd pain). Shake well. Each dose to containe 28m maalox and 184mviscous lidocaine   aspirin 81 MG tablet Take 81 mg by mouth daily.   azelastine (OPTIVAR) 0.05 % ophthalmic solution INSTILL ONE DROP TO THE AFFECTED EYE(S) TWICE A DAY   Blood Glucose Monitoring Suppl (ONE TOUCH ULTRA 2) w/Device KIT USE AS DIRECTED   cephALEXin (KEFLEX) 500 MG capsule Take 1 capsule (500 mg total) by mouth 3 (three) times daily for 7 days.   citalopram (CELEXA) 40 MG tablet TAKE ONE TABLET BY MOUTH DAILY   cyclobenzaprine (FLEXERIL) 5 MG tablet TAKE 1 TABLET BY MOUTH 3 TIMES DAILY AS NEEDED MUSCLE SPASMS   diclofenac (VOLTAREN) 75 MG EC tablet Take 1 tablet (75 mg total) by mouth 2 (two) times daily as needed for moderate pain.   glipiZIDE (GLUCOTROL XL) 5 MG 24 hr tablet Take 1 tablet (5 mg total) by mouth daily with breakfast.   glucose blood (CONTOUR TEST) test strip Check blood sugars 3 times daily. E11.65  (uncontrolled insulin dependent diabetes)   insulin aspart (NOVOLOG FLEXPEN) 100 UNIT/ML FlexPen Use before meals up to three times a day. Take 6 units if sugar is over 200. 8 units if over 250 and 10 units if over 300.   insulin detemir (LEVEMIR FLEXPEN) 100 UNIT/ML FlexPen INJECT 56 UNITS INTO THE SKIN DAILY   insulin lispro (HUMALOG KWIKPEN) 200 UNIT/ML KwikPen Use before meals up to three times a day. Take 6 units if sugar is over 200. 8 units if over 250 and 10 units if over 300.   Insulin Pen Needle 32G X 4 MM MISC 1 each by Does not apply route 4 (four) times daily -  before meals and at bedtime.   lisinopril (ZESTRIL) 20 MG tablet Take 1 tablet (20 mg total) by mouth daily.   metoCLOPramide (REGLAN) 10 MG tablet Take 1 tablet (10 mg total) by mouth 4 (four) times daily -  before meals and at bedtime.   nitroGLYCERIN (NITROSTAT) 0.4 MG SL tablet Place 1 tablet (0.4 mg total) under the tongue every 5 (five) minutes as needed for chest pain.   oxyCODONE (ROXICODONE) 15 MG immediate release tablet Take 1 tablet (15 mg total) by mouth every 4 (four) hours as needed for pain.   oxyCODONE (ROXICODONE) 5 MG immediate release tablet Take 1 tablet (5 mg total) by mouth every 8 (eight) hours as needed.   pantoprazole (PROTONIX) 40 MG tablet Take 1 tablet (40 mg total) by mouth daily.   pioglitazone (ACTOS) 30 MG tablet Take 1 tablet (30 mg total)  by mouth daily.   promethazine (PHENERGAN) 25 MG tablet Take 1 tablet (25 mg total) by mouth every 8 (eight) hours as needed for nausea or vomiting.   rosuvastatin (CRESTOR) 40 MG tablet Take 1 tablet (40 mg total) by mouth daily.   sucralfate (CARAFATE) 1 g tablet Take 1 tablet (1 g total) by mouth 4 (four) times daily.   tirzepatide [facility]) 5 MG/0.5ML Pen Inject 5 mg into the skin once a week.   traZODone (DESYREL) 100 MG tablet Take 2 tablets (200 mg total) by mouth at bedtime.   triamcinolone cream (KENALOG) 0.1 % Apply 1 Application topically 2 (two)  times daily.   No facility-administered medications prior to visit.    Review of Systems  Constitutional:  Negative for appetite change, chills and fever.  Respiratory:  Negative for chest tightness, shortness of breath and wheezing.   Cardiovascular:  Negative for chest pain and palpitations.  Gastrointestinal:  Negative for abdominal pain, nausea and vomiting.    {Labs  Heme  Chem  Endocrine  Serology  Results Review (optional):23779}   Objective    There were no vitals taken for this visit. {Show previous vital signs (optional):23777}  Physical Exam  ***  No results found for any visits on 03/17/22.  Assessment & Plan     ***  No follow-ups on file.      {provider attestation***:1}   [name], MD  [place] [phone] (phone) [phone] (fax)  [facility]

## 2022-03-16 NOTE — Telephone Encounter (Signed)
  Chief Complaint: Vincent Hall to [facility] ED last night for gastric/abd pain.  Prescription called in by ED doctor but pt was requesting Dr. Caryn Section to approve it because that's what the pharmacy told his wife when she went to pick it up.    (There is a discrepancy in his note when he called in and what he told me over the phone).    See pt's note and triage note for details. Symptoms: abd/gastric pain because he hasn't be able to get medications ordered by ED doctor last night from the pharmacy. Frequency: N/A Pertinent Negatives: Patient denies N/A Disposition: '[]'$ ED /'[]'$ Urgent Care (no appt availability in office) / '[]'$ Appointment(In office/virtual)/ '[]'$  [place] [facility]/ '[x]'$ Home Care/ '[]'$ Refused Recommended Disposition /'[]'$ [place] [facility]/ '[]'$  Follow-up with PCP Additional Notes: I sent this information to [facility] for Dr. Maralyn Sago information.   Pt has an appt with him tomorrow 12/8 at 2:20.

## 2022-03-16 NOTE — Telephone Encounter (Signed)
Left message advising patients appt time on 12/08.

## 2022-03-16 NOTE — Telephone Encounter (Signed)
Message from Loma Boston sent at 03/16/2022  1:37 PM EST  Summary: pt not willing to go pu meds that ER has called in does not like that pharmacy/wants meds asap   Pls fu with pt. He was sent home from the ER with medications and he states he is still hurting but does not want to deal with the pharmacy [facility]s were sent to. Upset as now hurting and needs help as has no meds. He does not know the name of meds just wanting to have them sent to another Pharmacy. He wants to talk to the nurse, tried to explain that if he was hurting and wanting meds asap maybe he could just go one time and pick them up. Pt is insisting that they be sent to another pharmacy as soon as possible and wants to talk to a nurse .[phone] fu          Call History   Type Contact Phone/Fax User  03/16/2022 01:30 PM EST Phone (Incoming) Vincent Hall, Vincent [place] N "Junior" (Self) [phone] Lemmie Evens) Loma Boston   Reason for Disposition  Caller has medicine question only, adult not sick, AND triager answers question  Answer Assessment - Initial Assessment Questions 1. NAME of MEDICINE: "What medicine(s) are you calling about?"    ED dr sent my medicine to [facility] in [place] last night.    They are waiting for Dr. Caryn Section to approve it.  It's GI cocktail.   The pharmacy told my wife that Dr. Caryn Section has to approve it when she went to pick it up for me.    I let him know Dr. Caryn Section won't approve a medication he did not prescribe.   I let him know that the medication does not need Dr. Maralyn Sago approval if the ED dr. Prescribed it.   He should be able to pick it up from the pharmacy. 2. QUESTION: "What is your question?" (e.g., double dose of medicine, side effect)     Can Dr. Caryn Section approve the medicine the emergency room dr sent to [facility] in [place] for me last night?     (In his message he mentioned his meds were sent to [facility] and he did not want to deal with them.   During this conversation it's  [facility] in [place] that won't give him the medication from the ED).  (In the ED note from [facility] he went to last night Protonix was prescribed for him).     3. PRESCRIBER: "Who prescribed the medicine?" Reason: if prescribed by specialist, call should be referred to that group.     Emergency room doctor from last night 4. SYMPTOMS: "Do you have any symptoms?" If Yes, ask: "What symptoms are you having?"  "How bad are the symptoms (e.g., mild, moderate, severe)     "I'm still hurting because I need my medications the ER dr. Durene Cal in last night.     5. PREGNANCY:  "Is there any chance that you are pregnant?" "When was your last menstrual period?"     N/A  Protocols used: Medication Question Call-A-AH

## 2022-03-16 NOTE — Telephone Encounter (Signed)
ER prescribed: sucralfate (CARAFATE) 100 mg/mL suspension  Take 10 mL (1 g total) by mouth four (4) times a day. 1200 mL  0 03/16/2022 04/15/2022  pantoprazole (PROTONIX) 20 MG tablet  Take 1 tablet (20 mg total) by mouth daily for 20 days. 20 tablet  0 03/16/2022 04/05/2022   I can't tell from this message where he wants prescriptions sent, but is OK to send to whichever pharmacy he prefers.

## 2022-03-17 ENCOUNTER — Ambulatory Visit: Payer: BC Managed Care – PPO | Admitting: Family Medicine

## 2022-03-17 DIAGNOSIS — R1013 Epigastric pain: Secondary | ICD-10-CM | POA: Diagnosis not present

## 2022-03-21 ENCOUNTER — Ambulatory Visit (INDEPENDENT_AMBULATORY_CARE_PROVIDER_SITE_OTHER): Payer: BC Managed Care – PPO | Admitting: Family Medicine

## 2022-03-21 ENCOUNTER — Encounter: Payer: Self-pay | Admitting: Family Medicine

## 2022-03-21 VITALS — BP 118/64 | HR 64 | Resp 16 | Wt 215.0 lb

## 2022-03-21 DIAGNOSIS — R1013 Epigastric pain: Secondary | ICD-10-CM

## 2022-03-21 MED ORDER — RABEPRAZOLE SODIUM 20 MG PO TBEC: 20.0000 mg | DELAYED_RELEASE_TABLET | Freq: Every day | ORAL | 3 refills | Status: DC

## 2022-03-21 NOTE — Progress Notes (Signed)
I,Vincent Hall,acting as a scribe for Vincent Huh, MD.,have documented all relevant documentation on the behalf of Vincent Huh, MD,as directed by  Vincent Huh, MD while in the presence of Vincent Huh, MD.   Established patient visit   Patient: Vincent Hall   DOB: 1965/11/30   56 y.o. Male  MRN: [id number] Visit Date: 03/21/2022  Today's healthcare provider: [name], MD   Chief Complaint  Patient presents with   Hospitalization Follow-up   Subjective    HPI  Follow up ER visit  Patient was seen in ER for Flu like symptoms on 03/13/2022 and 03-16-2022 He was treated for abdominal pain and hematuria. He had normal CMP and lipase with mild leukocytosis on CBC. Negative respiratory panel.  Treatment for this included GI cocktail, prescription for pantoprazole and liquid sulcrafate. However he already had prescription for sulcrafate tablets and he states insurance would not cover liquid sulcrafate or pantoprazole.  He reports good compliance with treatment. He reports this condition is Improved.  He was seen by Dr. 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 He had EGD by Dr. Bonna Gains  in 2020 findings of suspected eosinophilic esophagitis.  ----------------------------------------------------------------------------------------- Patient is feeling much better but concerned that symptoms have been recurring over several months. .  Medications: Outpatient Medications Prior to Visit  Medication Sig   albuterol (VENTOLIN HFA) 108 (90 Base) MCG/ACT inhaler Inhale 2 puffs into the lungs every 6 (six) hours as needed for wheezing or shortness of breath.   Alum & Mag Hydroxide-Simeth (GI COCKTAIL) SUSP suspension Take 30 mLs by mouth 2 (two) times daily as needed for indigestion (abd pain). Shake well. Each dose to containe 54m maalox and 174mviscous lidocaine   aspirin 81 MG tablet Take 81 mg by mouth daily.   azelastine (OPTIVAR) 0.05 % ophthalmic solution INSTILL  ONE DROP TO THE AFFECTED EYE(S) TWICE A DAY   Blood Glucose Monitoring Suppl (ONE TOUCH ULTRA 2) w/Device KIT USE AS DIRECTED   citalopram (CELEXA) 40 MG tablet TAKE ONE TABLET BY MOUTH DAILY   cyclobenzaprine (FLEXERIL) 5 MG tablet TAKE 1 TABLET BY MOUTH 3 TIMES DAILY AS NEEDED MUSCLE SPASMS   diclofenac (VOLTAREN) 75 MG EC tablet Take 1 tablet (75 mg total) by mouth 2 (two) times daily as needed for moderate pain.   glipiZIDE (GLUCOTROL XL) 5 MG 24 hr tablet Take 1 tablet (5 mg total) by mouth daily with breakfast.   glucose blood (CONTOUR TEST) test strip Check blood sugars 3 times daily. E11.65 (uncontrolled insulin dependent diabetes)   insulin aspart (NOVOLOG FLEXPEN) 100 UNIT/ML FlexPen Use before meals up to three times a day. Take 6 units if sugar is over 200. 8 units if over 250 and 10 units if over 300.   insulin detemir (LEVEMIR FLEXPEN) 100 UNIT/ML FlexPen INJECT 56 UNITS INTO THE SKIN DAILY   insulin lispro (HUMALOG KWIKPEN) 200 UNIT/ML KwikPen Use before meals up to three times a day. Take 6 units if sugar is over 200. 8 units if over 250 and 10 units if over 300.   Insulin Pen Needle 32G X 4 MM MISC 1 each by Does not apply route 4 (four) times daily -  before meals and at bedtime.   nitroGLYCERIN (NITROSTAT) 0.4 MG SL tablet Place 1 tablet (0.4 mg total) under the tongue every 5 (five) minutes as needed for chest pain.   oxyCODONE (ROXICODONE) 15 MG immediate release tablet Take 1 tablet (15 mg total) by mouth every 4 (four)  hours as needed for pain.   oxyCODONE (ROXICODONE) 5 MG immediate release tablet Take 1 tablet (5 mg total) by mouth every 8 (eight) hours as needed.   pantoprazole (PROTONIX) 40 MG tablet Take 1 tablet (40 mg total) by mouth daily.   pioglitazone (ACTOS) 30 MG tablet Take 1 tablet (30 mg total) by mouth daily.   promethazine (PHENERGAN) 25 MG tablet Take 1 tablet (25 mg total) by mouth every 8 (eight) hours as needed for nausea or vomiting.   rosuvastatin  (CRESTOR) 40 MG tablet Take 1 tablet (40 mg total) by mouth daily.   sucralfate (CARAFATE) 1 g tablet Take 1 tablet (1 g total) by mouth 4 (four) times daily.   tirzepatide [facility]) 5 MG/0.5ML Pen Inject 5 mg into the skin once a week.   traZODone (DESYREL) 100 MG tablet Take 2 tablets (200 mg total) by mouth at bedtime.   triamcinolone cream (KENALOG) 0.1 % Apply 1 Application topically 2 (two) times daily.   lisinopril (ZESTRIL) 20 MG tablet Take 1 tablet (20 mg total) by mouth daily.   metoCLOPramide (REGLAN) 10 MG tablet Take 1 tablet (10 mg total) by mouth 4 (four) times daily -  before meals and at bedtime.   No facility-administered medications prior to visit.    Review of Systems  Constitutional:  Negative for appetite change, chills and fever.  Respiratory:  Negative for chest tightness, shortness of breath and wheezing.   Cardiovascular:  Negative for chest pain and palpitations.  Gastrointestinal:  Negative for abdominal pain, nausea and vomiting.       Objective    BP 118/64 (BP Location: Right Arm, Patient Position: Sitting, Cuff Size: Large)   Pulse 64   Resp 16   Wt 215 lb (97.5 kg)   SpO2 99%   BMI 32.69 kg/m    Physical Exam  General Appearance:    Mildly obese male, alert, cooperative, in no acute distress  Eyes:    PERRL, conjunctiva/corneas clear, EOM's intact       Lungs:     Clear to auscultation bilaterally, respirations unlabored  Heart:    Normal heart rate. Normal rhythm. No murmurs, rubs, or gallops.    Abdomen:   bowel sounds present and normal in all 4 quadrants, soft, round, nontender, or nondistended. No CVA tenderness         Assessment & Plan     1. Epigastric pain Recurrent over the last several months. Resolved with GI cocktail given in emergency room. He would like prescription and will get back to me as to which compounding pharmacy he would like to use.   - H. pylori breath test   He was prescribed rabeprazole in the past for GERD  which was effective and will restart since insurance does not cover pantoprazole.   - RABEprazole (ACIPHEX) 20 MG tablet; Take 1 tablet (20 mg total) by mouth daily.  Dispense: 90 tablet; Refill: 3      The entirety of the information documented in the History of Present Illness, Review of Systems and Physical Exam were personally obtained by me. Portions of this information were initially documented by the CMA and reviewed by me for thoroughness and accuracy.     Vincent Huh, MD  [facility] [phone] (phone) [phone] (fax)  [place]

## 2022-03-23 ENCOUNTER — Other Ambulatory Visit: Payer: Self-pay | Admitting: *Deleted

## 2022-03-23 ENCOUNTER — Encounter: Payer: Self-pay | Admitting: *Deleted

## 2022-03-23 DIAGNOSIS — R1013 Epigastric pain: Secondary | ICD-10-CM

## 2022-03-23 LAB — H. PYLORI BREATH TEST: H pylori Breath Test: NEGATIVE

## 2022-03-27 ENCOUNTER — Ambulatory Visit: Payer: Self-pay | Admitting: *Deleted

## 2022-03-27 NOTE — Patient Outreach (Signed)
  Care Coordination   03/27/2022 Name: KARY COLAIZZI MRN: [id number] DOB: 1966-01-31   Care Coordination Outreach Attempts:  An unsuccessful telephone outreach was attempted for a scheduled appointment today.  Follow Up Plan:  Additional outreach attempts will be made to offer the patient care coordination information and services.   Encounter Outcome:  No Answer   Care Coordination Interventions:  No, not indicated    Valente David, RN, MSN, [facility] Care Management Care Management Coordinator [phone]

## 2022-04-12 ENCOUNTER — Ambulatory Visit: Payer: Self-pay | Admitting: *Deleted

## 2022-04-12 DIAGNOSIS — G8929 Other chronic pain: Secondary | ICD-10-CM

## 2022-04-12 DIAGNOSIS — R0789 Other chest pain: Secondary | ICD-10-CM

## 2022-04-12 NOTE — Patient Outreach (Signed)
  Care Coordination   Follow Up Visit Note   04/12/2022 Name: Vincent Hall MRN: [id number] DOB: 08-25-1965  Vincent Hall is a 57 y.o. year old male who sees Fisher, Kirstie Peri, MD for primary care. I spoke with  Vincent Hall by phone today.  What matters to the patients health and wellness today?  Was seen in ED twice since last outreach due to abdominal pain, much better now.  Still working on improving DM management.     Goals Addressed             This Visit's Progress    RNCM: Effective Management of DM   On track    Care Coordination Interventions:  Lab Results  Component Value Date   HGBA1C 12.7 (A) 02/08/2022    Provided education to patient about basic DM disease process Reviewed medications with patient and discussed importance of medication adherence. The patient is having cost constraint issues with affordability of medications and sometimes goes without. Is receptive to a pharmacy consult. Counseled on importance of regular laboratory monitoring as prescribed.  A1C has decreased slightly, noted above Discussed plans with patient for ongoing care management follow up and provided patient with direct contact information for care management team Provided patient with written educational materials related to hypo and hyperglycemia and importance of correct treatment Reviewed scheduled/upcoming provider appointments including:  Next PCP appointment is 1/12 Advised patient, providing education and rationale, to check cbg BID and record, calling pcp for findings outside established parameters. The patient is checking his blood sugars bid. The patient states it is better, ranging 100-129. Review with the patient the need of fasting blood sugars <130 and post <180. Discussed his increase in exercise since being on vacation, state he has lost about 20 pounds.  He will continue exercising once he is back home.  Review of patient status, including review of consultants  reports, relevant laboratory and other test results, and medications completed The patient is receptive to working with the [facility] for help with management of chronic conditions. Review of the goals of the program.        RNCM: Effective Management of Pain   On track    Care Coordination Interventions: Reviewed provider established plan for pain management Discussed importance of adherence to all scheduled medical appointments Counseled on the importance of reporting any/all new or changed pain symptoms or management strategies to pain management provider Advised patient to report to care team affect of pain on daily activities Discussed use of relaxation techniques and/or diversional activities to assist with pain reduction (distraction, imagery, relaxation, massage, acupressure, TENS, heat, and cold application Reviewed with patient prescribed pharmacological and nonpharmacological pain relief strategies Advised patient to discuss unresolved pain or changes in level or intensity of pain with provider The patient states he is in need of a refill of his Oxycodone '15mg'$  to be called into pharmacy at [facility] in [place].  Will request from PCP.          SDOH assessments and interventions completed:  No     Care Coordination Interventions:  Yes, provided   Follow up plan: Follow up call scheduled for 2/12    Encounter Outcome:  Pt. Visit Completed   Valente David, RN, MSN, [place] Care Management Care Management Coordinator [phone]

## 2022-04-12 NOTE — Patient Instructions (Signed)
Visit Information  Thank you for taking time to visit with me today. Please don't hesitate to contact me if I can be of assistance to you before our next scheduled telephone appointment.  Following are the goals we discussed today:  Continue exercising/walking once back home to improve DM management and weight loss.  Continue monitoring blood sugar on a daily basis.   Our next appointment is by telephone on 2/12  Please call the care guide team at [phone] if you need to cancel or reschedule your appointment.   Please call the Suicide and Crisis Lifeline: 988 call the [place] National Suicide Prevention Lifeline: 409-005-0242 or TTY: 6512587254 TTY 8583840602) to talk to a trained counselor call 1-800-273-TALK (toll free, 24 hour hotline) if you are experiencing a Mental Health or [place] or need someone to talk to.  Patient verbalizes understanding of instructions and care plan provided today and agrees to view in [place]. Active MyChart status and patient understanding of how to access instructions and care plan via MyChart confirmed with patient.     The patient has been provided with contact information for the care management team and has been advised to call with any health related questions or concerns.   Valente David, RN, MSN, [place] Care Management Care Management Coordinator [phone]

## 2022-04-13 MED ORDER — OXYCODONE HCL 15 MG PO TABS: 15.0000 mg | ORAL_TABLET | ORAL | 0 refills | Status: DC | PRN

## 2022-04-13 NOTE — Addendum Note (Signed)
Addended by: Birdie Sons on: 04/13/2022 04:11 PM   Modules accepted: Orders

## 2022-04-13 NOTE — Addendum Note (Signed)
Addended by: Birdie Sons on: 04/13/2022 04:07 PM   Modules accepted: Orders

## 2022-04-21 ENCOUNTER — Ambulatory Visit (INDEPENDENT_AMBULATORY_CARE_PROVIDER_SITE_OTHER): Payer: BC Managed Care – PPO | Admitting: Family Medicine

## 2022-04-21 ENCOUNTER — Encounter: Payer: Self-pay | Admitting: Family Medicine

## 2022-04-21 VITALS — BP 125/70 | HR 83 | Ht 68.0 in | Wt 217.5 lb

## 2022-04-21 DIAGNOSIS — E1169 Type 2 diabetes mellitus with other specified complication: Secondary | ICD-10-CM | POA: Diagnosis not present

## 2022-04-21 DIAGNOSIS — Z87891 Personal history of nicotine dependence: Secondary | ICD-10-CM | POA: Diagnosis not present

## 2022-04-21 LAB — POCT GLYCOSYLATED HEMOGLOBIN (HGB A1C)
Est. average glucose Bld gHb Est-mCnc: 174
Hemoglobin A1C: 7.7 % — AB (ref 4.0–5.6)

## 2022-04-21 NOTE — Progress Notes (Signed)
I,Sha'taria Tyson,acting as a Education administrator for [name], MD.,have documented all relevant documentation on the behalf of [name], MD,as directed by  [name], MD while in the presence of [name], MD.  Established patient visit   Patient: Vincent Hall   DOB: 18-Jun-1965   57 y.o. Male  MRN: [id number] Visit Date: 04/21/2022  Today's healthcare provider: [name], MD   No chief complaint on file.  Subjective    HPI  Diabetes Mellitus Type II, Follow-up  Lab Results  Component Value Date   HGBA1C 12.7 (A) 02/08/2022   HGBA1C 13.6 (A) 09/23/2021   HGBA1C 12.2 (H) 08/02/2021   Wt Readings from Last 3 Encounters:  03/21/22 215 lb (97.5 kg)  03/13/22 216 lb 0.8 oz (98 kg)  02/08/22 216 lb (98 kg)   Last seen for diabetes 2 months ago.  Management since then includes starting [name] 2.'5mg'$  weekly and increasing insulin detemir (LEVEMIR FLEXPEN) 100 UNIT/ML FlexPen; INJECT 56 UNITS INTO THE SKIN DAILY . He reports excellent compliance with treatment. He is not having side effects.  Symptoms: No fatigue No foot ulcerations  No appetite changes No nausea  No paresthesia of the feet  No polydipsia  No polyuria No visual disturbances   No vomiting     Home blood sugar records: fasting range: 120-130  Episodes of hypoglycemia? No   He states he has been much better with diet and nearly eliminated sugar. Is feeling much better and states he lost nearly 20 pounds since changing diet.   He has cut back on insulin to 56 units every other day instead of every day and fastings are consistently around the 120-130 range.   Most Recent Eye Exam: 1 year ago, [facility] Pertinent Labs: Lab Results  Component Value Date   CHOL 243 (H) 02/08/2022   HDL 41 02/08/2022   LDLCALC 177 (H) 02/08/2022   TRIG 138 02/08/2022   CHOLHDL 2.9 08/02/2021   Lab Results  Component Value Date   NA 136 03/13/2022   K 3.3 (L) 03/13/2022   CREATININE 0.87 03/13/2022    GFRNONAA >60 03/13/2022   LABMICR <3.0 08/02/2021   MICRALBCREAT <7 08/02/2021     ---------------------------------------------------------------------------------------------------  Lipid/Cholesterol, Follow-up  Last lipid panel Other pertinent labs  Lab Results  Component Value Date   CHOL 243 (H) 02/08/2022   HDL 41 02/08/2022   LDLCALC 177 (H) 02/08/2022   TRIG 138 02/08/2022   CHOLHDL 2.9 08/02/2021   Lab Results  Component Value Date   ALT 28 03/13/2022   AST 23 03/13/2022   PLT 271 03/13/2022   TSH 1.890 02/08/2022     He was last seen for this 2 months ago.  Management since that visit includes restart rosuvastatin.  He reports excellent compliance with treatment. He is not having side effects.   Symptoms: No chest pain No chest pressure/discomfort  No dyspnea No lower extremity edema  No numbness or tingling of extremity No orthopnea  No palpitations No paroxysmal nocturnal dyspnea  No speech difficulty No syncope    The 10-year ASCVD risk score (Arnett DK, et al., 2019) is: 19.2%  ---------------------------------------------------------------------------------------------------   Medications: Outpatient Medications Prior to Visit  Medication Sig   albuterol (VENTOLIN HFA) 108 (90 Base) MCG/ACT inhaler Inhale 2 puffs into the lungs every 6 (six) hours as needed for wheezing or shortness of breath.   Alum & Mag Hydroxide-Simeth (GI COCKTAIL) SUSP suspension Take 30 mLs by mouth 2 (two)  times daily as needed for indigestion (abd pain). Shake well. Each dose to containe 52m maalox and 134mviscous lidocaine   aspirin 81 MG tablet Take 81 mg by mouth daily.   azelastine (OPTIVAR) 0.05 % ophthalmic solution INSTILL ONE DROP TO THE AFFECTED EYE(S) TWICE A DAY   Blood Glucose Monitoring Suppl (ONE TOUCH ULTRA 2) w/Device KIT USE AS DIRECTED   citalopram (CELEXA) 40 MG tablet TAKE ONE TABLET BY MOUTH DAILY   cyclobenzaprine (FLEXERIL) 5 MG tablet TAKE 1 TABLET  BY MOUTH 3 TIMES DAILY AS NEEDED MUSCLE SPASMS   diclofenac (VOLTAREN) 75 MG EC tablet Take 1 tablet (75 mg total) by mouth 2 (two) times daily as needed for moderate pain.   glipiZIDE (GLUCOTROL XL) 5 MG 24 hr tablet Take 1 tablet (5 mg total) by mouth daily with breakfast.   glucose blood (CONTOUR TEST) test strip Check blood sugars 3 times daily. E11.65 (uncontrolled insulin dependent diabetes)   insulin aspart (NOVOLOG FLEXPEN) 100 UNIT/ML FlexPen Use before meals up to three times a day. Take 6 units if sugar is over 200. 8 units if over 250 and 10 units if over 300.   insulin detemir (LEVEMIR FLEXPEN) 100 UNIT/ML FlexPen INJECT 56 UNITS INTO THE SKIN EVERY OTHER DAY   insulin lispro (HUMALOG KWIKPEN) 200 UNIT/ML KwikPen Use before meals up to three times a day. Take 6 units if sugar is over 200. 8 units if over 250 and 10 units if over 300.   Insulin Pen Needle 32G X 4 MM MISC 1 each by Does not apply route 4 (four) times daily -  before meals and at bedtime.   lisinopril (ZESTRIL) 20 MG tablet Take 1 tablet (20 mg total) by mouth daily.   metoCLOPramide (REGLAN) 10 MG tablet Take 1 tablet (10 mg total) by mouth 4 (four) times daily -  before meals and at bedtime.   nitroGLYCERIN (NITROSTAT) 0.4 MG SL tablet Place 1 tablet (0.4 mg total) under the tongue every 5 (five) minutes as needed for chest pain.   oxyCODONE (ROXICODONE) 15 MG immediate release tablet Take 1 tablet (15 mg total) by mouth every 4 (four) hours as needed for pain.   pioglitazone (ACTOS) 30 MG tablet Take 1 tablet (30 mg total) by mouth daily.   promethazine (PHENERGAN) 25 MG tablet Take 1 tablet (25 mg total) by mouth every 8 (eight) hours as needed for nausea or vomiting.   RABEprazole (ACIPHEX) 20 MG tablet Take 1 tablet (20 mg total) by mouth daily.   rosuvastatin (CRESTOR) 40 MG tablet Take 1 tablet (40 mg total) by mouth daily.   sucralfate (CARAFATE) 1 g tablet Take 1 tablet (1 g total) by mouth 4 (four) times daily.    tirzepatide ([facility]5 MG/0.5ML Pen Inject 5 mg into the skin once a week.   traZODone (DESYREL) 100 MG tablet Take 2 tablets (200 mg total) by mouth at bedtime.   triamcinolone cream (KENALOG) 0.1 % Apply 1 Application topically 2 (two) times daily.   No facility-administered medications prior to visit.    Review of Systems  Constitutional:  Negative for appetite change, chills and fever.  Respiratory:  Negative for chest tightness, shortness of breath and wheezing.   Cardiovascular:  Negative for chest pain and palpitations.  Gastrointestinal:  Negative for abdominal pain, nausea and vomiting.       Objective    BP 125/70 (BP Location: Left Arm, Patient Position: Sitting, Cuff Size: Large)   Pulse 83  Ht '5\' 8"'$  (1.727 m)   Wt 217 lb 8 oz (98.7 kg)   SpO2 100%   BMI 33.07 kg/m      Results for orders placed or performed in visit on 04/21/22  POCT HgB A1C  Result Value Ref Range   Hemoglobin A1C 7.7 (A) 4.0 - 5.6 %   Est. average glucose Bld gHb Est-mCnc 174     Assessment & Plan     1. Type 2 diabetes mellitus with other specified complication, without long-term current use of insulin ([place]) Greatly improved since last visit when mounjaro was started and significant improvements in diet. Is tolerating titration of to '5mg'$  of mounjaro and will go to 7.5 with next refill.   He is currently taking 56 units levemir QOD but advised he could probably have same results with 28 units QD. Can reduce more if fasting sugars drop below 100.   2. Stopped smoking with greater than 20 pack year history needs Ambulatory Referral for Lung Cancer Screening [REF832]   Future Appointments  Date Time Provider [place]  05/22/2022  2:00 PM Valente David, RN [facility] None  07/21/2022  3:00 PM Caryn Section Kirstie Peri, MD [facility]        The entirety of the information documented in the History of Present Illness, Review of Systems and Physical Exam were personally obtained by me.  Portions of this information were initially documented by the CMA and reviewed by me for thoroughness and accuracy.     [name], MD  [facility] [phone] (phone) [phone] (fax)  [place]

## 2022-04-21 NOTE — Patient Instructions (Signed)
Please review the attached list of medications and notify my office if there are any errors.   I'll send in a prescription for 7.'5mg'$  dose of [name] to your pharmacy before your current prescription runs out.   Start cutting back on Levemir if you fasting sugars drop below 100

## 2022-05-07 ENCOUNTER — Other Ambulatory Visit: Payer: Self-pay | Admitting: Family Medicine

## 2022-05-07 DIAGNOSIS — G8929 Other chronic pain: Secondary | ICD-10-CM

## 2022-05-08 ENCOUNTER — Telehealth: Payer: Self-pay | Admitting: Family Medicine

## 2022-05-08 NOTE — Telephone Encounter (Signed)
Pt is stating when saw Dr F on 04/21/22 he had spoke with him re sending him in a GI cocktail to:                [place], [place] Hydroxide-Simeth (GI COCKTAIL) SUSP suspension T Phone: [phone]  Fax: [phone]

## 2022-05-08 NOTE — Telephone Encounter (Signed)
Requested Prescriptions  Pending Prescriptions Disp Refills   diclofenac (VOLTAREN) 75 MG EC tablet [Pharmacy Med Name: DICLOFENAC SOD EC 75 MG TAB] 30 tablet 0    Sig: TAKE 1 TABLET BY MOUTH TWO TIMES A DAY AS NEEDED FOR MODERATE PAIN     Analgesics:  NSAIDS Failed - 05/08/2022 11:37 AM      Failed - Manual Review: Labs are only required if the patient has taken medication for more than 8 weeks.      Passed - Cr in normal range and within 360 days    Creat  Date Value Ref Range Status  01/31/2017 0.93 0.70 - 1.33 mg/dL Final    Comment:    For patients >63 years of age, the reference limit for Creatinine is approximately 13% higher for people identified as African-American. .    Creatinine, Ser  Date Value Ref Range Status  03/13/2022 0.87 0.61 - 1.24 mg/dL Final   Creatinine, POC  Date Value Ref Range Status  07/05/2015 NA mg/dL Final         Passed - HGB in normal range and within 360 days    Hemoglobin  Date Value Ref Range Status  03/13/2022 15.3 13.0 - 17.0 g/dL Final  02/08/2022 14.4 13.0 - 17.7 g/dL Final         Passed - PLT in normal range and within 360 days    Platelets  Date Value Ref Range Status  03/13/2022 271 150 - 400 K/uL Final  02/08/2022 241 150 - 450 x10E3/uL Final         Passed - HCT in normal range and within 360 days    HCT  Date Value Ref Range Status  03/13/2022 46.4 39.0 - 52.0 % Final   Hematocrit  Date Value Ref Range Status  02/08/2022 45.0 37.5 - 51.0 % Final         Passed - eGFR is 30 or above and within 360 days    GFR, Est African American  Date Value Ref Range Status  01/31/2017 109 > OR = 60 mL/min/1.56m Final   GFR calc Af Amer  Date Value Ref Range Status  05/10/2020 106 >59 mL/min/1.73 Final    Comment:    **In accordance with recommendations from the NKF-ASN Task force,**   [facility] is in the process of updating its eGFR calculation to the   2021 CKD-EPI creatinine equation that estimates kidney function    without a race variable.    GFR, Est Non African American  Date Value Ref Range Status  01/31/2017 94 > OR = 60 mL/min/1.776mFinal   GFR, Estimated  Date Value Ref Range Status  03/13/2022 >60 >60 mL/min Final    Comment:    (NOTE) Calculated using the CKD-EPI Creatinine Equation (2021)    GFR  Date Value Ref Range Status  01/15/2020 85.87 >60.00 mL/min Final   eGFR  Date Value Ref Range Status  02/08/2022 92 >59 mL/min/1.73 Final         Passed - Patient is not pregnant      Passed - Valid encounter within last 12 months    Recent Outpatient Visits           2 weeks ago Type 2 diabetes mellitus with other specified complication, without long-term current use of insulin [place]   1 month ago Epigastric pain   [place]  2 months ago Type 2 diabetes mellitus with other specified complication, without long-term current use of insulin ([place])   [place], Donald E, MD   5 months ago Stomach irritation   [place], Donald E, MD   7 months ago Uncontrolled type 2 diabetes mellitus with hyperglycemia [facility])   [place], Donald E, MD       Future Appointments             In 4 weeks Jonathon Bellows, MD [place] [facility] [place]   In 2 months Fisher, Kirstie Peri, MD [facility], [place]

## 2022-05-10 MED ORDER — GI COCKTAIL ~~LOC~~: 30.0000 mL | Freq: Two times a day (BID) | ORAL | 0 refills | Status: AC | PRN

## 2022-05-12 ENCOUNTER — Other Ambulatory Visit: Payer: Self-pay | Admitting: Family Medicine

## 2022-05-12 DIAGNOSIS — R0789 Other chest pain: Secondary | ICD-10-CM

## 2022-05-12 DIAGNOSIS — G8929 Other chronic pain: Secondary | ICD-10-CM

## 2022-05-12 NOTE — Telephone Encounter (Signed)
Medication Refill - Medication: oxyCODONE (ROXICODONE) 15 MG immediate release tablet   Has the patient contacted their pharmacy? No. (Agent: If no, request that the patient contact the pharmacy for the refill. If patient does not wish to contact the pharmacy document the reason why and proceed with request.) (Agent: If yes, when and what did the pharmacy advise?)  Preferred Pharmacy (with phone number or street name): [facility] [name], [place]  Has the patient been seen for an appointment in the last year OR does the patient have an upcoming appointment? Yes.    Agent: Please be advised that RX refills may take up to 3 business days. We ask that you follow-up with your pharmacy.

## 2022-05-12 NOTE — Telephone Encounter (Signed)
Requested medications are due for refill today.  Provider to determine  Requested medications are on the active medications list.  yes  Last refill. 04/13/2022 #180 0 rf  Future visit scheduled.   yes  Notes to clinic.  Refill not delegated.    Requested Prescriptions  Pending Prescriptions Disp Refills   oxyCODONE (ROXICODONE) 15 MG immediate release tablet 180 tablet 0    Sig: Take 1 tablet (15 mg total) by mouth every 4 (four) hours as needed for pain.     Not Delegated - Analgesics:  Opioid Agonists Failed - 05/12/2022  1:27 PM      Failed - This refill cannot be delegated      Failed - Urine Drug Screen completed in last 360 days      Passed - Valid encounter within last 3 months    Recent Outpatient Visits           3 weeks ago Type 2 diabetes mellitus with other specified complication, without long-term current use of insulin ([place])   [place], Donald E, MD   1 month ago Epigastric pain   [place], Donald E, MD   3 months ago Type 2 diabetes mellitus with other specified complication, without long-term current use of insulin ([place])   [place], Donald E, MD   5 months ago Stomach irritation   [place], Donald E, MD   7 months ago Uncontrolled type 2 diabetes mellitus with hyperglycemia [facility])   [place], Kirstie Peri, MD       Future Appointments             In 3 weeks Jonathon Bellows, [place] [facility] at [place]   In 2 months Fisher, Kirstie Peri, MD [facility], [place]

## 2022-05-13 ENCOUNTER — Other Ambulatory Visit: Payer: Self-pay | Admitting: Family Medicine

## 2022-05-13 DIAGNOSIS — E1169 Type 2 diabetes mellitus with other specified complication: Secondary | ICD-10-CM

## 2022-05-13 MED ORDER — TIRZEPATIDE 7.5 MG/0.5ML ~~LOC~~ SOAJ: 7.5000 mg | SUBCUTANEOUS | 1 refills | Status: DC

## 2022-05-14 MED ORDER — OXYCODONE HCL 15 MG PO TABS: 15.0000 mg | ORAL_TABLET | ORAL | 0 refills | Status: DC | PRN

## 2022-05-22 ENCOUNTER — Ambulatory Visit: Payer: Self-pay | Admitting: *Deleted

## 2022-05-22 NOTE — Patient Instructions (Signed)
Visit Information  Thank you for taking time to visit with me today. Please don't hesitate to contact me if I can be of assistance to you before our next scheduled telephone appointment.  Following are the goals we discussed today:  Pick up [name] refill from pharmacy, increase to 7.5 mg as instructed.   Our next appointment is by telephone on 4/16  Please call the care guide team at [phone] if you need to cancel or reschedule your appointment.   Please call the Suicide and Crisis Lifeline: 988 call the [place] National Suicide Prevention Lifeline: 407-836-7428 or TTY: 281-589-6092 TTY 732-526-1280) to talk to a trained counselor call 1-800-273-TALK (toll free, 24 hour hotline) call 911 if you are experiencing a Mental Health or [place] or need someone to talk to.  Patient verbalizes understanding of instructions and care plan provided today and agrees to view in [place]. Active MyChart status and patient understanding of how to access instructions and care plan via MyChart confirmed with patient.     The patient has been provided with contact information for the care management team and has been advised to call with any health related questions or concerns.   Valente David, RN, MSN, [place] Care Management Care Management Coordinator [phone]

## 2022-05-22 NOTE — Patient Outreach (Signed)
  Care Coordination   Follow Up Visit Note   05/22/2022 Name: Vincent Hall MRN: [id number] DOB: Dec 19, 1965  Vincent Hall is a 57 y.o. year old male who sees Fisher, Kirstie Peri, MD for primary care. I spoke with  Vincent Hall by phone today.  What matters to the patients health and wellness today?  Keeping A1C down, decreasing to goal of less than 7    Goals Addressed             This Visit's Progress    RNCM: Effective Management of DM   On track    Care Coordination Interventions:  Lab Results  Component Value Date   HGBA1C 7.7 (A) 04/21/2022    Provided education to patient about basic DM disease process Reviewed medications with patient and discussed importance of medication adherence. Need to pick up refill for [name] to increase dose to 7.5, Levemir dose decreased to 48 Counseled on importance of regular laboratory monitoring as prescribed.  A1C has decreased, noted above Discussed plans with patient for ongoing care management follow up and provided patient with direct contact information for care management team Provided patient with written educational materials related to hypo and hyperglycemia and importance of correct treatment Reviewed scheduled/upcoming provider appointments including:  Next PCP appointment is 4/12, GI on 2/27 for intermittent recurrent abdominal pain Advised patient, providing education and rationale, to check cbg BID and record, calling pcp for findings outside established parameters. The patient is checking his blood sugars bid. The patient states it is consistently decreasing. Review with the patient the need of fasting blood sugars <130 and post <180. Discussed his increase in exercise, will continue current regime Review of patient status, including review of consultants reports, relevant laboratory and other test results, and medications completed The patient is receptive to working with the [facility] for help with management of chronic  conditions. Review of the goals of the program.           SDOH assessments and interventions completed:  No     Care Coordination Interventions:  Yes, provided   Follow up plan: Follow up call scheduled for 4/16    Encounter Outcome:  Pt. Visit Completed   Valente David, RN, MSN, [place] Care Management Care Management Coordinator [phone]

## 2022-06-03 DIAGNOSIS — J45909 Unspecified asthma, uncomplicated: Secondary | ICD-10-CM | POA: Diagnosis not present

## 2022-06-03 DIAGNOSIS — R1013 Epigastric pain: Secondary | ICD-10-CM | POA: Diagnosis not present

## 2022-06-03 DIAGNOSIS — Z7982 Long term (current) use of aspirin: Secondary | ICD-10-CM | POA: Diagnosis not present

## 2022-06-03 DIAGNOSIS — Z7951 Long term (current) use of inhaled steroids: Secondary | ICD-10-CM | POA: Diagnosis not present

## 2022-06-03 DIAGNOSIS — Z1152 Encounter for screening for COVID-19: Secondary | ICD-10-CM | POA: Diagnosis not present

## 2022-06-03 DIAGNOSIS — F319 Bipolar disorder, unspecified: Secondary | ICD-10-CM | POA: Diagnosis not present

## 2022-06-03 DIAGNOSIS — Z7952 Long term (current) use of systemic steroids: Secondary | ICD-10-CM | POA: Diagnosis not present

## 2022-06-03 DIAGNOSIS — Z888 Allergy status to other drugs, medicaments and biological substances status: Secondary | ICD-10-CM | POA: Diagnosis not present

## 2022-06-03 DIAGNOSIS — I2510 Atherosclerotic heart disease of native coronary artery without angina pectoris: Secondary | ICD-10-CM | POA: Diagnosis not present

## 2022-06-03 DIAGNOSIS — I10 Essential (primary) hypertension: Secondary | ICD-10-CM | POA: Diagnosis not present

## 2022-06-03 DIAGNOSIS — E119 Type 2 diabetes mellitus without complications: Secondary | ICD-10-CM | POA: Diagnosis not present

## 2022-06-03 DIAGNOSIS — R1110 Vomiting, unspecified: Secondary | ICD-10-CM | POA: Diagnosis not present

## 2022-06-03 DIAGNOSIS — R079 Chest pain, unspecified: Secondary | ICD-10-CM | POA: Diagnosis not present

## 2022-06-03 DIAGNOSIS — E785 Hyperlipidemia, unspecified: Secondary | ICD-10-CM | POA: Diagnosis not present

## 2022-06-03 DIAGNOSIS — K219 Gastro-esophageal reflux disease without esophagitis: Secondary | ICD-10-CM | POA: Diagnosis not present

## 2022-06-03 DIAGNOSIS — M545 Low back pain, unspecified: Secondary | ICD-10-CM | POA: Diagnosis not present

## 2022-06-03 DIAGNOSIS — Z20822 Contact with and (suspected) exposure to covid-19: Secondary | ICD-10-CM | POA: Diagnosis not present

## 2022-06-03 DIAGNOSIS — F1120 Opioid dependence, uncomplicated: Secondary | ICD-10-CM | POA: Diagnosis not present

## 2022-06-03 DIAGNOSIS — Z79899 Other long term (current) drug therapy: Secondary | ICD-10-CM | POA: Diagnosis not present

## 2022-06-03 DIAGNOSIS — R45851 Suicidal ideations: Secondary | ICD-10-CM | POA: Diagnosis not present

## 2022-06-03 DIAGNOSIS — R197 Diarrhea, unspecified: Secondary | ICD-10-CM | POA: Diagnosis not present

## 2022-06-03 DIAGNOSIS — Z794 Long term (current) use of insulin: Secondary | ICD-10-CM | POA: Diagnosis not present

## 2022-06-03 DIAGNOSIS — R1084 Generalized abdominal pain: Secondary | ICD-10-CM | POA: Diagnosis not present

## 2022-06-03 DIAGNOSIS — Z87891 Personal history of nicotine dependence: Secondary | ICD-10-CM | POA: Diagnosis not present

## 2022-06-03 DIAGNOSIS — R10816 Epigastric abdominal tenderness: Secondary | ICD-10-CM | POA: Diagnosis not present

## 2022-06-03 DIAGNOSIS — Z951 Presence of aortocoronary bypass graft: Secondary | ICD-10-CM | POA: Diagnosis not present

## 2022-06-03 DIAGNOSIS — R112 Nausea with vomiting, unspecified: Secondary | ICD-10-CM | POA: Diagnosis not present

## 2022-06-03 DIAGNOSIS — F32A Depression, unspecified: Secondary | ICD-10-CM | POA: Diagnosis not present

## 2022-06-04 ENCOUNTER — Other Ambulatory Visit: Payer: Self-pay | Admitting: Family Medicine

## 2022-06-04 DIAGNOSIS — E119 Type 2 diabetes mellitus without complications: Secondary | ICD-10-CM | POA: Diagnosis not present

## 2022-06-04 DIAGNOSIS — R45851 Suicidal ideations: Secondary | ICD-10-CM | POA: Diagnosis not present

## 2022-06-04 DIAGNOSIS — Z7982 Long term (current) use of aspirin: Secondary | ICD-10-CM | POA: Diagnosis not present

## 2022-06-04 DIAGNOSIS — M545 Low back pain, unspecified: Secondary | ICD-10-CM | POA: Diagnosis not present

## 2022-06-04 DIAGNOSIS — E785 Hyperlipidemia, unspecified: Secondary | ICD-10-CM | POA: Diagnosis not present

## 2022-06-04 DIAGNOSIS — R10816 Epigastric abdominal tenderness: Secondary | ICD-10-CM | POA: Diagnosis not present

## 2022-06-04 DIAGNOSIS — I2510 Atherosclerotic heart disease of native coronary artery without angina pectoris: Secondary | ICD-10-CM | POA: Diagnosis not present

## 2022-06-04 DIAGNOSIS — Z794 Long term (current) use of insulin: Secondary | ICD-10-CM | POA: Diagnosis not present

## 2022-06-04 DIAGNOSIS — R1084 Generalized abdominal pain: Secondary | ICD-10-CM | POA: Diagnosis not present

## 2022-06-04 DIAGNOSIS — F1120 Opioid dependence, uncomplicated: Secondary | ICD-10-CM | POA: Diagnosis not present

## 2022-06-04 DIAGNOSIS — R1013 Epigastric pain: Secondary | ICD-10-CM | POA: Diagnosis not present

## 2022-06-04 DIAGNOSIS — R112 Nausea with vomiting, unspecified: Secondary | ICD-10-CM | POA: Diagnosis not present

## 2022-06-04 DIAGNOSIS — Z1152 Encounter for screening for COVID-19: Secondary | ICD-10-CM | POA: Diagnosis not present

## 2022-06-04 DIAGNOSIS — Z951 Presence of aortocoronary bypass graft: Secondary | ICD-10-CM | POA: Diagnosis not present

## 2022-06-04 DIAGNOSIS — F32A Depression, unspecified: Secondary | ICD-10-CM | POA: Diagnosis not present

## 2022-06-04 DIAGNOSIS — I10 Essential (primary) hypertension: Secondary | ICD-10-CM | POA: Diagnosis not present

## 2022-06-04 DIAGNOSIS — K219 Gastro-esophageal reflux disease without esophagitis: Secondary | ICD-10-CM | POA: Diagnosis not present

## 2022-06-04 DIAGNOSIS — J45909 Unspecified asthma, uncomplicated: Secondary | ICD-10-CM | POA: Diagnosis not present

## 2022-06-05 DIAGNOSIS — F32A Depression, unspecified: Secondary | ICD-10-CM | POA: Diagnosis not present

## 2022-06-05 DIAGNOSIS — H1013 Acute atopic conjunctivitis, bilateral: Secondary | ICD-10-CM | POA: Insufficient documentation

## 2022-06-05 DIAGNOSIS — Z9189 Other specified personal risk factors, not elsewhere classified: Secondary | ICD-10-CM | POA: Insufficient documentation

## 2022-06-05 DIAGNOSIS — J45909 Unspecified asthma, uncomplicated: Secondary | ICD-10-CM | POA: Insufficient documentation

## 2022-06-05 DIAGNOSIS — Z20822 Contact with and (suspected) exposure to covid-19: Secondary | ICD-10-CM | POA: Diagnosis not present

## 2022-06-05 DIAGNOSIS — R112 Nausea with vomiting, unspecified: Secondary | ICD-10-CM | POA: Diagnosis not present

## 2022-06-05 DIAGNOSIS — I10 Essential (primary) hypertension: Secondary | ICD-10-CM | POA: Insufficient documentation

## 2022-06-05 DIAGNOSIS — M545 Low back pain, unspecified: Secondary | ICD-10-CM | POA: Diagnosis not present

## 2022-06-05 DIAGNOSIS — R1084 Generalized abdominal pain: Secondary | ICD-10-CM | POA: Diagnosis not present

## 2022-06-05 NOTE — Telephone Encounter (Signed)
Requested Prescriptions  Refused Prescriptions Disp Refills   pantoprazole (PROTONIX) 40 MG tablet [Pharmacy Med Name: PANTOPRAZOLE SOD DR 40 MG TAB] 90 tablet 3    Sig: TAKE ONE TABLET BY MOUTH DAILY     Gastroenterology: Proton Pump Inhibitors Passed - 06/04/2022 10:43 AM      Passed - Valid encounter within last 12 months    Recent Outpatient Visits           1 month ago Type 2 diabetes mellitus with other specified complication, without long-term current use of insulin ([place])   [place] Birdie Sons, MD   2 months ago Epigastric pain   [place], Donald E, MD   3 months ago Type 2 diabetes mellitus with other specified complication, without long-term current use of insulin [facility])   [place], Donald E, MD   6 months ago Stomach irritation   [place], Donald E, MD   8 months ago Uncontrolled type 2 diabetes mellitus with hyperglycemia [facility])   [place], Kirstie Peri, MD       Future Appointments             Tomorrow Jonathon Bellows, [place] [facility] [place]   In 1 month Fisher, Kirstie Peri, MD [facility], [place]

## 2022-06-06 ENCOUNTER — Ambulatory Visit: Payer: BC Managed Care – PPO | Admitting: Gastroenterology

## 2022-06-06 ENCOUNTER — Telehealth: Payer: Self-pay | Admitting: Family Medicine

## 2022-06-06 DIAGNOSIS — R1084 Generalized abdominal pain: Secondary | ICD-10-CM | POA: Diagnosis not present

## 2022-06-06 DIAGNOSIS — F32A Depression, unspecified: Secondary | ICD-10-CM | POA: Diagnosis not present

## 2022-06-06 NOTE — Progress Notes (Deleted)
Vincent Bellows MD, MRCP(U.K) [address]  [place]  [place], [place] [address]  Main: [phone]  Fax: [phone]   Primary Care Physician: Birdie Sons, MD  Primary Gastroenterologist:  Dr. Jonathon Hall   No chief complaint on file.   HPI: Vincent Hall is a 57 y.o. male   Summary of history :  Last seen at the office back in October 2022 for colitis.  Seen by Dr. Bonna Gains back in July 2022 for proctocolitis.At that point of time the patient had been to the ER for constipation and abdominal pain with a CT showing mild circumferential wall thickening of the transverse and descending colon as well as the rectum.  Discharge on antibiotics he denies any diarrhea at that point of time.  Was having some dysphagia.  Has had prior colon polyps.  The plan was to perform a colonoscopy which was done in July 2020 by Dr. Bonna Gains which showed congestion in the cecum and biopsies were taken.  Otherwise rest of the colon appeared normal.  An upper endoscopy was performed on the same day.  That showed no abnormalities except for mild erythema at the antrum.  Biopsies of the stomach showed reactive gastropathy and biopsies of the esophagus showed no abnormality.  Cecal biopsies showed increased mucosal eosinophils up to 200 per high-power field.Marland Kitchen  He was advised to stop any NSAIDs.  And since he had no diarrhea or abdominal pain did not need any further work-up..   In November 2021 I see that he had an EGD at [facility] which was done for abdominal pain.  I cannot see the endoscopy report.  On 01/24/2021 was admitted to the hospital with nausea vomiting and abdominal pain and found to have DKA.  He underwent a CT abdomen which showed likely diabetic gastroparesis in the setting of poorly controlled diabetes mellitus and was commenced on Reglan.   11/27/2020 underwent a CT scan of the abdomen in the ER that showed apparent thickening in the proximal to mid transverse colon and possibly  within the cecum concerning for colitis or mass cannot be excluded in the cecal area.  Colonoscopy was recommended when acute symptoms resolved exclude a mass lesion   01/24/2021: HbA1c 11.6, Hemoglobin 16.7, CMP showed no abnormalities in liver function test but an elevated anion gap and blood sugars in the setting of DKA   He states that previously was on oxycodone for back pain and was leading to severe constipation and presently he is giving off of it and it has improved his bowel movements and presently has 1 soft bowel movement a day.  No other complaints.  Not having any vomiting no abdominal pain no NSAID use no diarrhea.    Interval history   02/01/2021-06/06/2022  ***   Current Outpatient Medications  Medication Sig Dispense Refill   albuterol (VENTOLIN HFA) 108 (90 Base) MCG/ACT inhaler Inhale 2 puffs into the lungs every 6 (six) hours as needed for wheezing or shortness of breath. 1 each 3   Alum & Mag Hydroxide-Simeth (GI COCKTAIL) SUSP suspension Take 30 mLs by mouth 2 (two) times daily as needed for indigestion (abd pain). Shake well. Each dose to containe 12m maalox and 174mviscous lidocaine 300 mL 0   aspirin 81 MG tablet Take 81 mg by mouth daily.     azelastine (OPTIVAR) 0.05 % ophthalmic solution INSTILL ONE DROP TO THE AFFECTED EYE(S) TWICE A DAY 6 mL 3   Blood Glucose Monitoring Suppl (ONE  TOUCH ULTRA 2) w/Device KIT USE AS DIRECTED 1 each 0   citalopram (CELEXA) 40 MG tablet TAKE ONE TABLET BY MOUTH DAILY 90 tablet 4   cyclobenzaprine (FLEXERIL) 5 MG tablet TAKE 1 TABLET BY MOUTH 3 TIMES DAILY AS NEEDED MUSCLE SPASMS 30 tablet 11   diclofenac (VOLTAREN) 75 MG EC tablet TAKE 1 TABLET BY MOUTH TWO TIMES A DAY AS NEEDED FOR MODERATE PAIN 30 tablet 0   glipiZIDE (GLUCOTROL XL) 5 MG 24 hr tablet Take 1 tablet (5 mg total) by mouth daily with breakfast. 90 tablet 4   glucose blood (CONTOUR TEST) test strip Check blood sugars 3 times daily. E11.65 (uncontrolled insulin  dependent diabetes) 100 each 12   insulin aspart (NOVOLOG FLEXPEN) 100 UNIT/ML FlexPen Use before meals up to three times a day. Take 6 units if sugar is over 200. 8 units if over 250 and 10 units if over 300. 15 mL 11   insulin detemir (LEVEMIR FLEXPEN) 100 UNIT/ML FlexPen INJECT 56 UNITS INTO THE SKIN DAILY (Patient taking differently: INJECT 56 UNITS INTO THE SKIN EVERY OTHER DAY)     insulin lispro (HUMALOG KWIKPEN) 200 UNIT/ML KwikPen Use before meals up to three times a day. Take 6 units if sugar is over 200. 8 units if over 250 and 10 units if over 300. 3 mL 2   Insulin Pen Needle 32G X 4 MM MISC 1 each by Does not apply route 4 (four) times daily -  before meals and at bedtime. 100 each 0   lisinopril (ZESTRIL) 20 MG tablet Take 1 tablet (20 mg total) by mouth daily. 30 tablet 0   metoCLOPramide (REGLAN) 10 MG tablet Take 1 tablet (10 mg total) by mouth 4 (four) times daily -  before meals and at bedtime. 120 tablet 0   nitroGLYCERIN (NITROSTAT) 0.4 MG SL tablet Place 1 tablet (0.4 mg total) under the tongue every 5 (five) minutes as needed for chest pain. 20 tablet 1   oxyCODONE (ROXICODONE) 15 MG immediate release tablet Take 1 tablet (15 mg total) by mouth every 4 (four) hours as needed for pain. 180 tablet 0   pioglitazone (ACTOS) 30 MG tablet Take 1 tablet (30 mg total) by mouth daily. 90 tablet 4   promethazine (PHENERGAN) 25 MG tablet Take 1 tablet (25 mg total) by mouth every 8 (eight) hours as needed for nausea or vomiting. 6 tablet 0   RABEprazole (ACIPHEX) 20 MG tablet Take 1 tablet (20 mg total) by mouth daily. 90 tablet 3   rosuvastatin (CRESTOR) 40 MG tablet Take 1 tablet (40 mg total) by mouth daily. 90 tablet 3   sucralfate (CARAFATE) 1 g tablet Take 1 tablet (1 g total) by mouth 4 (four) times daily. 56 tablet 12   tirzepatide ([name]) 7.5 MG/0.5ML Pen Inject 7.5 mg into the skin once a week. 6 mL 1   traZODone (DESYREL) 100 MG tablet Take 2 tablets (200 mg total) by mouth  at bedtime. 180 tablet 4   triamcinolone cream (KENALOG) 0.1 % Apply 1 Application topically 2 (two) times daily. 30 g 2   No current facility-administered medications for this visit.    Allergies as of 06/06/2022 - Review Complete 03/21/2022  Allergen Reaction Noted   Montelukast Hives 06/03/2015   Depakote [divalproex sodium] Rash 01/11/2018   Invokana [canagliflozin] Other (See Comments) 06/17/2018   Semaglutide  08/02/2021   Metformin and related  01/06/2020   Atorvastatin Other (See Comments) 08/01/2013   Singulair [montelukast sodium]  Hives 09/18/2014    ROS:  General: Negative for anorexia, weight loss, fever, chills, fatigue, weakness. ENT: Negative for hoarseness, difficulty swallowing , nasal congestion. CV: Negative for chest pain, angina, palpitations, dyspnea on exertion, peripheral edema.  Respiratory: Negative for dyspnea at rest, dyspnea on exertion, cough, sputum, wheezing.  GI: See history of present illness. GU:  Negative for dysuria, hematuria, urinary incontinence, urinary frequency, nocturnal urination.  Endo: Negative for unusual weight change.    Physical Examination:   There were no vitals taken for this visit.  General: Well-nourished, well-developed in no acute distress.  Eyes: No icterus. Conjunctivae pink. Mouth: Oropharyngeal mucosa moist and pink , no lesions erythema or exudate. Lungs: Clear to auscultation bilaterally. Non-labored. Heart: Regular rate and rhythm, no murmurs rubs or gallops.  Abdomen: Bowel sounds are normal, nontender, nondistended, no hepatosplenomegaly or masses, no abdominal bruits or hernia , no rebound or guarding.   Extremities: No lower extremity edema. No clubbing or deformities. Neuro: Alert and oriented x 3.  Grossly intact. Skin: Warm and dry, no jaundice.   Psych: Alert and cooperative, normal mood and affect.   Imaging Studies: No results found.  Assessment and Plan:   Vincent Hall is a 57 y.o. y/o  male here to follow-up for colitis seen on ER visit when he was having abdominal pain in 11/27/2020.  Concern for a cecal mass but a colonoscopy done a year prior showed no abnormality.  We had set him up for a colonoscopy in December 2022 which he canceled.  He very likely has chronic constipation secondary to oxycodone use  Very likely it was a stool ball but will make sure we repeat a colonoscopy to ensure there is no neoplasm.  He very likely has had chronic constipation.  Secondary to oxycodone use.  In addition likely has diabetic gastroparesis.  Talk to him about dietary management and going off oxycodone which would benefit him in the long run.  I have provided him patient information about gastroparesis and diet which should be low in fiber and saturated fats explained that in main factor in gastroparesis of blood glucose levels.  Whenever the blood sugars go over 150 g/dL due to delayed gastric emptying.  Tight glycemic control will help with the emptying.  His HbA1c was over 11 when last checked translating to an approximate random blood sugar of 220.     Dr Vincent Bellows  MD,MRCP [facility]) Follow up in ***  BP check ***

## 2022-06-06 NOTE — Telephone Encounter (Signed)
Medication Refill - Medication: oxyCODONE (ROXICODONE) 15 MG immediate release tablet   Has the patient contacted their pharmacy? No.   Preferred Pharmacy (with phone number or street name):  [facility] [name], [place] Phone: [phone]  Fax: [phone]     Has the patient been seen for an appointment in the last year OR does the patient have an upcoming appointment? Yes.    Agent: Please be advised that RX refills may take up to 3 business days. We ask that you follow-up with your pharmacy.

## 2022-06-06 NOTE — Telephone Encounter (Unsigned)
Copied from [place] [id number]. Topic: General - Other >> Jun 06, 2022  2:00 PM Dominique A wrote: Reason for CRM: Pt is calling to let PCP know that he has been admitted to the Hospital and he might be released tomorrow.

## 2022-06-07 DIAGNOSIS — E785 Hyperlipidemia, unspecified: Secondary | ICD-10-CM | POA: Diagnosis not present

## 2022-06-07 DIAGNOSIS — M549 Dorsalgia, unspecified: Secondary | ICD-10-CM | POA: Diagnosis not present

## 2022-06-07 DIAGNOSIS — F1120 Opioid dependence, uncomplicated: Secondary | ICD-10-CM | POA: Diagnosis not present

## 2022-06-07 DIAGNOSIS — R519 Headache, unspecified: Secondary | ICD-10-CM | POA: Diagnosis not present

## 2022-06-07 DIAGNOSIS — F1994 Other psychoactive substance use, unspecified with psychoactive substance-induced mood disorder: Secondary | ICD-10-CM | POA: Diagnosis not present

## 2022-06-07 DIAGNOSIS — I2510 Atherosclerotic heart disease of native coronary artery without angina pectoris: Secondary | ICD-10-CM | POA: Diagnosis not present

## 2022-06-07 DIAGNOSIS — R112 Nausea with vomiting, unspecified: Secondary | ICD-10-CM | POA: Diagnosis not present

## 2022-06-07 DIAGNOSIS — R1013 Epigastric pain: Secondary | ICD-10-CM | POA: Diagnosis not present

## 2022-06-07 DIAGNOSIS — R10817 Generalized abdominal tenderness: Secondary | ICD-10-CM | POA: Diagnosis not present

## 2022-06-07 DIAGNOSIS — E876 Hypokalemia: Secondary | ICD-10-CM | POA: Diagnosis not present

## 2022-06-07 DIAGNOSIS — R45851 Suicidal ideations: Secondary | ICD-10-CM | POA: Diagnosis not present

## 2022-06-07 DIAGNOSIS — E119 Type 2 diabetes mellitus without complications: Secondary | ICD-10-CM | POA: Diagnosis not present

## 2022-06-07 DIAGNOSIS — R197 Diarrhea, unspecified: Secondary | ICD-10-CM | POA: Diagnosis not present

## 2022-06-07 DIAGNOSIS — G8929 Other chronic pain: Secondary | ICD-10-CM | POA: Diagnosis not present

## 2022-06-07 DIAGNOSIS — F32A Depression, unspecified: Secondary | ICD-10-CM | POA: Diagnosis not present

## 2022-06-07 DIAGNOSIS — Z7982 Long term (current) use of aspirin: Secondary | ICD-10-CM | POA: Diagnosis not present

## 2022-06-07 DIAGNOSIS — Z794 Long term (current) use of insulin: Secondary | ICD-10-CM | POA: Diagnosis not present

## 2022-06-07 DIAGNOSIS — J45909 Unspecified asthma, uncomplicated: Secondary | ICD-10-CM | POA: Diagnosis not present

## 2022-06-07 DIAGNOSIS — R1084 Generalized abdominal pain: Secondary | ICD-10-CM | POA: Diagnosis not present

## 2022-06-08 DIAGNOSIS — E876 Hypokalemia: Secondary | ICD-10-CM | POA: Insufficient documentation

## 2022-06-08 DIAGNOSIS — R112 Nausea with vomiting, unspecified: Secondary | ICD-10-CM | POA: Diagnosis not present

## 2022-06-08 DIAGNOSIS — F1994 Other psychoactive substance use, unspecified with psychoactive substance-induced mood disorder: Secondary | ICD-10-CM | POA: Diagnosis not present

## 2022-06-08 DIAGNOSIS — R197 Diarrhea, unspecified: Secondary | ICD-10-CM | POA: Diagnosis not present

## 2022-06-08 DIAGNOSIS — Z87898 Personal history of other specified conditions: Secondary | ICD-10-CM | POA: Insufficient documentation

## 2022-06-08 DIAGNOSIS — F1990 Other psychoactive substance use, unspecified, uncomplicated: Secondary | ICD-10-CM | POA: Insufficient documentation

## 2022-06-08 DIAGNOSIS — R45851 Suicidal ideations: Secondary | ICD-10-CM | POA: Diagnosis not present

## 2022-06-08 DIAGNOSIS — R1084 Generalized abdominal pain: Secondary | ICD-10-CM | POA: Diagnosis not present

## 2022-06-09 ENCOUNTER — Telehealth: Payer: Self-pay | Admitting: Family Medicine

## 2022-06-09 DIAGNOSIS — R112 Nausea with vomiting, unspecified: Secondary | ICD-10-CM | POA: Diagnosis not present

## 2022-06-09 DIAGNOSIS — F1120 Opioid dependence, uncomplicated: Secondary | ICD-10-CM | POA: Diagnosis not present

## 2022-06-09 DIAGNOSIS — E876 Hypokalemia: Secondary | ICD-10-CM | POA: Diagnosis not present

## 2022-06-09 DIAGNOSIS — Z7984 Long term (current) use of oral hypoglycemic drugs: Secondary | ICD-10-CM | POA: Diagnosis not present

## 2022-06-09 DIAGNOSIS — E119 Type 2 diabetes mellitus without complications: Secondary | ICD-10-CM | POA: Diagnosis not present

## 2022-06-09 DIAGNOSIS — J45909 Unspecified asthma, uncomplicated: Secondary | ICD-10-CM | POA: Diagnosis not present

## 2022-06-09 DIAGNOSIS — F5105 Insomnia due to other mental disorder: Secondary | ICD-10-CM | POA: Diagnosis not present

## 2022-06-09 DIAGNOSIS — F1210 Cannabis abuse, uncomplicated: Secondary | ICD-10-CM | POA: Diagnosis not present

## 2022-06-09 DIAGNOSIS — I10 Essential (primary) hypertension: Secondary | ICD-10-CM | POA: Diagnosis not present

## 2022-06-09 DIAGNOSIS — E785 Hyperlipidemia, unspecified: Secondary | ICD-10-CM | POA: Diagnosis not present

## 2022-06-09 DIAGNOSIS — R1084 Generalized abdominal pain: Secondary | ICD-10-CM | POA: Diagnosis not present

## 2022-06-09 DIAGNOSIS — Z794 Long term (current) use of insulin: Secondary | ICD-10-CM | POA: Diagnosis not present

## 2022-06-09 DIAGNOSIS — R10817 Generalized abdominal tenderness: Secondary | ICD-10-CM | POA: Diagnosis not present

## 2022-06-09 DIAGNOSIS — K219 Gastro-esophageal reflux disease without esophagitis: Secondary | ICD-10-CM | POA: Diagnosis not present

## 2022-06-09 DIAGNOSIS — R45851 Suicidal ideations: Secondary | ICD-10-CM | POA: Diagnosis not present

## 2022-06-09 DIAGNOSIS — R519 Headache, unspecified: Secondary | ICD-10-CM | POA: Diagnosis not present

## 2022-06-09 DIAGNOSIS — M549 Dorsalgia, unspecified: Secondary | ICD-10-CM | POA: Diagnosis not present

## 2022-06-09 DIAGNOSIS — Z9151 Personal history of suicidal behavior: Secondary | ICD-10-CM | POA: Diagnosis not present

## 2022-06-09 DIAGNOSIS — I2510 Atherosclerotic heart disease of native coronary artery without angina pectoris: Secondary | ICD-10-CM | POA: Diagnosis not present

## 2022-06-09 DIAGNOSIS — Z7982 Long term (current) use of aspirin: Secondary | ICD-10-CM | POA: Diagnosis not present

## 2022-06-09 DIAGNOSIS — F332 Major depressive disorder, recurrent severe without psychotic features: Secondary | ICD-10-CM | POA: Diagnosis not present

## 2022-06-09 DIAGNOSIS — G8929 Other chronic pain: Secondary | ICD-10-CM | POA: Diagnosis not present

## 2022-06-09 DIAGNOSIS — F32A Depression, unspecified: Secondary | ICD-10-CM | POA: Diagnosis not present

## 2022-06-09 DIAGNOSIS — R1013 Epigastric pain: Secondary | ICD-10-CM | POA: Diagnosis not present

## 2022-06-09 DIAGNOSIS — F3132 Bipolar disorder, current episode depressed, moderate: Secondary | ICD-10-CM | POA: Diagnosis not present

## 2022-06-14 ENCOUNTER — Ambulatory Visit: Payer: BC Managed Care – PPO | Admitting: Gastroenterology

## 2022-06-14 ENCOUNTER — Other Ambulatory Visit: Payer: Self-pay | Admitting: Family Medicine

## 2022-06-14 ENCOUNTER — Encounter: Payer: Self-pay | Admitting: Gastroenterology

## 2022-06-14 DIAGNOSIS — G8929 Other chronic pain: Secondary | ICD-10-CM

## 2022-06-14 DIAGNOSIS — R0789 Other chest pain: Secondary | ICD-10-CM

## 2022-06-14 NOTE — Telephone Encounter (Signed)
Requested medication (s) are due for refill today: yes  Requested medication (s) are on the active medication list: yes  Last refill:  05/14/22 #180/0  Future visit scheduled: yes  Notes to clinic:  Unable to refill per protocol, cannot delegate.      Requested Prescriptions  Pending Prescriptions Disp Refills   oxyCODONE (ROXICODONE) 15 MG immediate release tablet 180 tablet 0    Sig: Take 1 tablet (15 mg total) by mouth every 4 (four) hours as needed for pain.     Not Delegated - Analgesics:  Opioid Agonists Failed - 06/14/2022 12:39 PM      Failed - This refill cannot be delegated      Failed - Urine Drug Screen completed in last 360 days      Passed - Valid encounter within last 3 months    Recent Outpatient Visits           1 month ago Type 2 diabetes mellitus with other specified complication, without long-term current use of insulin ([place])   [place] Birdie Sons, MD   2 months ago Epigastric pain   [place], Donald E, MD   4 months ago Type 2 diabetes mellitus with other specified complication, without long-term current use of insulin [facility])   [place], Donald E, MD   6 months ago Stomach irritation   [place], Donald E, MD   8 months ago Uncontrolled type 2 diabetes mellitus with hyperglycemia [facility])   [place], Donald E, MD       Future Appointments             In 1 month Fisher, Kirstie Peri, MD [facility], [facility]

## 2022-06-14 NOTE — Progress Notes (Deleted)
Jonathon Bellows MD, MRCP(U.K) [address]  [place]  [place], [place] [address]  Main: [phone]  Fax: [phone]   Primary Care Physician: Birdie Sons, MD  Primary Gastroenterologist:  Dr. Jonathon Bellows   No chief complaint on file.   HPI: Vincent Hall is a 57 y.o. male   Summary of history :  Patient has previously been seen at our office back in July 2020 by Dr. Bonna Gains for proctocolitis.  At that point of time the patient had been to the ER for constipation and abdominal pain with a CT showing mild circumferential wall thickening of the transverse and descending colon as well as the rectum.  Discharge on antibiotics he denies any diarrhea at that point of time.  Was having some dysphagia.  Has had prior colon polyps.  The plan was to perform a colonoscopy which was done in July 2020 by Dr. Bonna Gains which showed congestion in the cecum and biopsies were taken.  Otherwise rest of the colon appeared normal.  An upper endoscopy was performed on the same day.  That showed no abnormalities except for mild erythema at the antrum.  Biopsies of the stomach showed reactive gastropathy and biopsies of the esophagus showed no abnormality.  Cecal biopsies showed increased mucosal eosinophils up to 200 per high-power field.Marland Kitchen  He was advised to stop any NSAIDs.  And since he had no diarrhea or abdominal pain did not need any further work-up..   In November 2021 I see that he had an EGD at [facility] which was done for abdominal pain.  I cannot see the endoscopy report.  On 01/24/2021 was admitted to the hospital with nausea vomiting and abdominal pain and found to have DKA.  He underwent a CT abdomen which showed likely diabetic gastroparesis in the setting of poorly controlled diabetes mellitus and was commenced on Reglan.   11/27/2020 underwent a CT scan of the abdomen in the ER that showed apparent thickening in the proximal to mid transverse colon and possibly within the cecum  concerning for colitis or mass cannot be excluded in the cecal area.  Colonoscopy was recommended when acute symptoms resolved exclude a mass lesion   01/24/2021: HbA1c 11.6, Hemoglobin 16.7, CMP showed no abnormalities in liver function test but an elevated anion gap and blood sugars in the setting of DKA   He states that previously was on oxycodone for back pain and was leading to severe constipation and presently he is giving off of it and it has improved his bowel movements and presently has 1 soft bowel movement a day.  No other complaints.  Not having any vomiting no abdominal pain no NSAID use no diarrhea.  Interval history   ***/***/2023-  ***/***/2023  ***   Current Outpatient Medications  Medication Sig Dispense Refill   albuterol (VENTOLIN HFA) 108 (90 Base) MCG/ACT inhaler Inhale 2 puffs into the lungs every 6 (six) hours as needed for wheezing or shortness of breath. 1 each 3   Alum & Mag Hydroxide-Simeth (GI COCKTAIL) SUSP suspension Take 30 mLs by mouth 2 (two) times daily as needed for indigestion (abd pain). Shake well. Each dose to containe 30m maalox and 153mviscous lidocaine 300 mL 0   aspirin 81 MG tablet Take 81 mg by mouth daily.     azelastine (OPTIVAR) 0.05 % ophthalmic solution INSTILL ONE DROP TO THE AFFECTED EYE(S) TWICE A DAY 6 mL 3   Blood Glucose Monitoring Suppl (ONE TOUCH ULTRA 2)  w/Device KIT USE AS DIRECTED 1 each 0   citalopram (CELEXA) 40 MG tablet TAKE ONE TABLET BY MOUTH DAILY 90 tablet 4   cyclobenzaprine (FLEXERIL) 5 MG tablet TAKE 1 TABLET BY MOUTH 3 TIMES DAILY AS NEEDED MUSCLE SPASMS 30 tablet 11   diclofenac (VOLTAREN) 75 MG EC tablet TAKE 1 TABLET BY MOUTH TWO TIMES A DAY AS NEEDED FOR MODERATE PAIN 30 tablet 0   glipiZIDE (GLUCOTROL XL) 5 MG 24 hr tablet Take 1 tablet (5 mg total) by mouth daily with breakfast. 90 tablet 4   glucose blood (CONTOUR TEST) test strip Check blood sugars 3 times daily. E11.65 (uncontrolled insulin dependent diabetes)  100 each 12   insulin aspart (NOVOLOG FLEXPEN) 100 UNIT/ML FlexPen Use before meals up to three times a day. Take 6 units if sugar is over 200. 8 units if over 250 and 10 units if over 300. 15 mL 11   insulin detemir (LEVEMIR FLEXPEN) 100 UNIT/ML FlexPen INJECT 56 UNITS INTO THE SKIN DAILY (Patient taking differently: INJECT 56 UNITS INTO THE SKIN EVERY OTHER DAY)     insulin lispro (HUMALOG KWIKPEN) 200 UNIT/ML KwikPen Use before meals up to three times a day. Take 6 units if sugar is over 200. 8 units if over 250 and 10 units if over 300. 3 mL 2   Insulin Pen Needle 32G X 4 MM MISC 1 each by Does not apply route 4 (four) times daily -  before meals and at bedtime. 100 each 0   lisinopril (ZESTRIL) 20 MG tablet Take 1 tablet (20 mg total) by mouth daily. 30 tablet 0   metoCLOPramide (REGLAN) 10 MG tablet Take 1 tablet (10 mg total) by mouth 4 (four) times daily -  before meals and at bedtime. 120 tablet 0   nitroGLYCERIN (NITROSTAT) 0.4 MG SL tablet Place 1 tablet (0.4 mg total) under the tongue every 5 (five) minutes as needed for chest pain. 20 tablet 1   oxyCODONE (ROXICODONE) 15 MG immediate release tablet Take 1 tablet (15 mg total) by mouth every 4 (four) hours as needed for pain. 180 tablet 0   pioglitazone (ACTOS) 30 MG tablet Take 1 tablet (30 mg total) by mouth daily. 90 tablet 4   promethazine (PHENERGAN) 25 MG tablet Take 1 tablet (25 mg total) by mouth every 8 (eight) hours as needed for nausea or vomiting. 6 tablet 0   RABEprazole (ACIPHEX) 20 MG tablet Take 1 tablet (20 mg total) by mouth daily. 90 tablet 3   rosuvastatin (CRESTOR) 40 MG tablet Take 1 tablet (40 mg total) by mouth daily. 90 tablet 3   sucralfate (CARAFATE) 1 g tablet Take 1 tablet (1 g total) by mouth 4 (four) times daily. 56 tablet 12   tirzepatide ([name]) 7.5 MG/0.5ML Pen Inject 7.5 mg into the skin once a week. 6 mL 1   traZODone (DESYREL) 100 MG tablet Take 2 tablets (200 mg total) by mouth at bedtime. 180  tablet 4   triamcinolone cream (KENALOG) 0.1 % Apply 1 Application topically 2 (two) times daily. 30 g 2   No current facility-administered medications for this visit.    Allergies as of 06/14/2022 - Review Complete 03/21/2022  Allergen Reaction Noted   Montelukast Hives 06/03/2015   Depakote [divalproex sodium] Rash 01/11/2018   Invokana [canagliflozin] Other (See Comments) 06/17/2018   Semaglutide  08/02/2021   Metformin and related  01/06/2020   Atorvastatin Other (See Comments) 08/01/2013   Singulair [montelukast sodium] Hives 09/18/2014  ROS:  General: Negative for anorexia, weight loss, fever, chills, fatigue, weakness. ENT: Negative for hoarseness, difficulty swallowing , nasal congestion. CV: Negative for chest pain, angina, palpitations, dyspnea on exertion, peripheral edema.  Respiratory: Negative for dyspnea at rest, dyspnea on exertion, cough, sputum, wheezing.  GI: See history of present illness. GU:  Negative for dysuria, hematuria, urinary incontinence, urinary frequency, nocturnal urination.  Endo: Negative for unusual weight change.    Physical Examination:   There were no vitals taken for this visit.  General: Well-nourished, well-developed in no acute distress.  Eyes: No icterus. Conjunctivae pink. Mouth: Oropharyngeal mucosa moist and pink , no lesions erythema or exudate. Lungs: Clear to auscultation bilaterally. Non-labored. Heart: Regular rate and rhythm, no murmurs rubs or gallops.  Abdomen: Bowel sounds are normal, nontender, nondistended, no hepatosplenomegaly or masses, no abdominal bruits or hernia , no rebound or guarding.   Extremities: No lower extremity edema. No clubbing or deformities. Neuro: Alert and oriented x 3.  Grossly intact. Skin: Warm and dry, no jaundice.   Psych: Alert and cooperative, normal mood and affect.   Imaging Studies: No results found.  Assessment and Plan:   Vincent Hall is a 57 y.o. y/o male here to follow  up  for colitis seen on ER visit when he was having abdominal pain in 11/27/2020.  Concern for a cecal mass but a colonoscopy done a year prior showed no abnormality.  Very likely it was a stool ball but will make sure we repeat a colonoscopy to ensure there is no neoplasm.    In addition likely has diabetic gastroparesis. He had been on  oxycodone which would benefit him in the long run.  I have provided him patient information about gastroparesis and diet which should be low in fiber and saturated fats explained that in main factor in gastroparesis of blood glucose levels.  Whenever the blood sugars go over 150 g/dL due to delayed gastric emptying.  Tight glycemic control will help with the emptying.  His HbA1c was over 11 when last checked translating to an approximate random blood sugar of 220.     Dr Jonathon Bellows  MD,MRCP [facility]) Follow up in ***  BP check ***

## 2022-06-14 NOTE — Telephone Encounter (Unsigned)
Copied from [place] [id number]. Topic: General - Other >> Jun 14, 2022 11:32 AM Everette C wrote: Reason for CRM: Medication Refill - Medication: oxyCODONE (ROXICODONE) 15 MG immediate release tablet [id number]  Has the patient contacted their pharmacy? Yes.   (Agent: If no, request that the patient contact the pharmacy for the refill. If patient does not wish to contact the pharmacy document the reason why and proceed with request.) (Agent: If yes, when and what did the pharmacy advise?)  Preferred Pharmacy (with phone number or street name): [facility] [name], [place] [address] Phone: [phone] Fax: [phone] Hours: Not open 24 hours   Has the patient been seen for an appointment in the last year OR does the patient have an upcoming appointment? Yes.    Agent: Please be advised that RX refills may take up to 3 business days. We ask that you follow-up with your pharmacy.

## 2022-06-15 MED ORDER — OXYCODONE HCL 15 MG PO TABS: 15.0000 mg | ORAL_TABLET | ORAL | 0 refills | Status: DC | PRN

## 2022-06-19 NOTE — Telephone Encounter (Signed)
See conversation note.

## 2022-07-10 ENCOUNTER — Other Ambulatory Visit: Payer: Self-pay | Admitting: Family Medicine

## 2022-07-10 DIAGNOSIS — G8929 Other chronic pain: Secondary | ICD-10-CM

## 2022-07-10 DIAGNOSIS — R0789 Other chest pain: Secondary | ICD-10-CM

## 2022-07-10 NOTE — Telephone Encounter (Signed)
Requested medications are due for refill today.  unsure  Requested medications are on the active medications list.  yes  Last refill. 06/15/2022 #180 0 rf  Future visit scheduled.   yes  Notes to clinic.  Refill not delegated.    Requested Prescriptions  Pending Prescriptions Disp Refills   oxyCODONE (ROXICODONE) 15 MG immediate release tablet 180 tablet 0    Sig: Take 1 tablet (15 mg total) by mouth every 4 (four) hours as needed for pain.     Not Delegated - Analgesics:  Opioid Agonists Failed - 07/10/2022  2:03 PM      Failed - This refill cannot be delegated      Failed - Urine Drug Screen completed in last 360 days      Passed - Valid encounter within last 3 months    Recent Outpatient Visits           2 months ago Type 2 diabetes mellitus with other specified complication, without long-term current use of insulin ([place])   [place], Donald E, MD   3 months ago Epigastric pain   [place], Donald E, MD   5 months ago Type 2 diabetes mellitus with other specified complication, without long-term current use of insulin [facility])   [place], Donald E, MD   7 months ago Stomach irritation   [place], Donald E, MD   9 months ago Uncontrolled type 2 diabetes mellitus with hyperglycemia [facility])   [place], Donald E, MD       Future Appointments             In 1 week Fisher, Kirstie Peri, MD [facility] [place]   In 2 weeks Jonathon Bellows, [facility]

## 2022-07-10 NOTE — Telephone Encounter (Signed)
Medication Refill - Medication: oxyCODONE (ROXICODONE) 15 MG immediate release tablet   Has the patient contacted their pharmacy? No. Controlled substance  Preferred Pharmacy (with phone number or street name):  [facility] [id number] [name], [place] Phone: [phone]  Fax: [phone]     Has the patient been seen for an appointment in the last year OR does the patient have an upcoming appointment? Yes.    Agent: Please be advised that RX refills may take up to 3 business days. We ask that you follow-up with your pharmacy.

## 2022-07-13 ENCOUNTER — Other Ambulatory Visit: Payer: Self-pay | Admitting: Family Medicine

## 2022-07-13 DIAGNOSIS — E1169 Type 2 diabetes mellitus with other specified complication: Secondary | ICD-10-CM

## 2022-07-13 DIAGNOSIS — G8929 Other chronic pain: Secondary | ICD-10-CM

## 2022-07-13 MED ORDER — OXYCODONE HCL 15 MG PO TABS
15.0000 mg | ORAL_TABLET | ORAL | 0 refills | Status: DC | PRN
Start: 2022-07-13 — End: 2022-08-07

## 2022-07-13 NOTE — Telephone Encounter (Signed)
Requested Prescriptions  Pending Prescriptions Disp Refills   nitroGLYCERIN (NITROSTAT) 0.4 MG SL tablet [Pharmacy Med Name: NITROGLYCERIN 0.4 MG TABLET SL] 90 tablet 0    Sig: DISSOLVE 1 TAB UNDER TONGUE FOR CHEST PAIN - IF PAIN REMAINS AFTER 5 MIN, CALL 911 AND REPEAT DOSE. MAX 3 TABS IN 15 MINUTES     Cardiovascular:  Nitrates Passed - 07/13/2022 10:40 AM      Passed - Last BP in normal range    BP Readings from Last 1 Encounters:  04/21/22 125/70         Passed - Last Heart Rate in normal range    Pulse Readings from Last 1 Encounters:  04/21/22 83         Passed - Valid encounter within last 12 months    Recent Outpatient Visits           2 months ago Type 2 diabetes mellitus with other specified complication, without long-term current use of insulin ([place])   [place], Donald E, MD   3 months ago Epigastric pain   [place], Donald E, MD   5 months ago Type 2 diabetes mellitus with other specified complication, without long-term current use of insulin ([place])   [place], Donald E, MD   7 months ago Stomach irritation   [place], Donald E, MD   9 months ago Uncontrolled type 2 diabetes mellitus with hyperglycemia ([place])   [employer] [employer], MD       Future Appointments             In 1 week Fisher, Kirstie Peri, MD [facility], [place]   In 1 week Jonathon Bellows, [place] [facility] at [facility]             insulin detemir (LEVEMIR FLEXPEN) 100 UNIT/ML FlexPen [Pharmacy Med Name: LEVEMIR FLEXPEN 100 UNIT/ML] 15 mL 0    Sig: INJECT [place] OTHER DAY     Endocrinology:  Diabetes - Insulins Passed - 07/13/2022 10:40 AM      Passed - HBA1C is between 0 and 7.9 and within 180 days    Hemoglobin A1C  Date Value Ref Range Status   04/21/2022 7.7 (A) 4.0 - 5.6 % Final  06/10/2021 10.3  Final   Hgb A1c MFr Bld  Date Value Ref Range Status  08/02/2021 12.2 (H) 4.8 - 5.6 % Final    Comment:             Prediabetes: 5.7 - 6.4          Diabetes: >6.4          Glycemic control for adults with diabetes: <7.0          Passed - Valid encounter within last 6 months    Recent Outpatient Visits           2 months ago Type 2 diabetes mellitus with other specified complication, without long-term current use of insulin ([employer])   [place] Birdie Sons, MD   3 months ago Epigastric pain   [place], Donald E, MD   5 months ago Type 2 diabetes mellitus with other specified complication, without long-term current use of insulin ([place])   [place] Birdie Sons, MD   7  months ago Stomach irritation   [place], Donald E, MD   9 months ago Uncontrolled type 2 diabetes mellitus with hyperglycemia [facility])   [place], MD       Future Appointments             In 1 week Fisher, Kirstie Peri, MD [facility], [place]   In 1 week Jonathon Bellows, [place] [facility] at [facility]             diclofenac (VOLTAREN) 75 MG EC tablet [Pharmacy Med Name: DICLOFENAC SOD EC 75 MG TAB] 90 tablet 0    Sig: TAKE 1 TABLET BY MOUTH TWICE A DAY AS NEEDED FOR MODERATE PAIN     Analgesics:  NSAIDS Failed - 07/13/2022 10:40 AM      Failed - Manual Review: Labs are only required if the patient has taken medication for more than 8 weeks.      Passed - Cr in normal range and within 360 days    Creat  Date Value Ref Range Status  01/31/2017 0.93 0.70 - 1.33 mg/dL Final    Comment:    For patients >32 years of age, the reference limit for Creatinine is approximately 13% higher for people identified as African-American. .     Creatinine, Ser  Date Value Ref Range Status  03/13/2022 0.87 0.61 - 1.24 mg/dL Final   Creatinine, POC  Date Value Ref Range Status  07/05/2015 NA mg/dL Final         Passed - HGB in normal range and within 360 days    Hemoglobin  Date Value Ref Range Status  03/13/2022 15.3 13.0 - 17.0 g/dL Final  02/08/2022 14.4 13.0 - 17.7 g/dL Final         Passed - PLT in normal range and within 360 days    Platelets  Date Value Ref Range Status  03/13/2022 271 150 - 400 K/uL Final  02/08/2022 241 150 - 450 x10E3/uL Final         Passed - HCT in normal range and within 360 days    HCT  Date Value Ref Range Status  03/13/2022 46.4 39.0 - 52.0 % Final   Hematocrit  Date Value Ref Range Status  02/08/2022 45.0 37.5 - 51.0 % Final         Passed - eGFR is 30 or above and within 360 days    GFR, Est African American  Date Value Ref Range Status  01/31/2017 109 > OR = 60 mL/min/1.66m2 Final   GFR calc Af Amer  Date Value Ref Range Status  05/10/2020 106 >59 mL/min/1.73 Final    Comment:    **In accordance with recommendations from the NKF-ASN Task force,**   [facility] is in the process of updating its eGFR calculation to the   2021 CKD-EPI creatinine equation that estimates kidney function   without a race variable.    GFR, Est Non African American  Date Value Ref Range Status  01/31/2017 94 > OR = 60 mL/min/1.48m2 Final   GFR, Estimated  Date Value Ref Range Status  03/13/2022 >60 >60 mL/min Final    Comment:    (NOTE) Calculated using the CKD-EPI Creatinine Equation (2021)    GFR  Date Value Ref Range Status  01/15/2020 85.87 >60.00 mL/min Final   eGFR  Date Value Ref Range Status  02/08/2022 92 >59 mL/min/1.73 Final  Passed - Patient is not pregnant      Passed - Valid encounter within last 12 months    Recent Outpatient Visits           2 months ago Type 2 diabetes mellitus with other specified complication, without long-term current use of  insulin ([place])   [place], Donald E, MD   3 months ago Epigastric pain   [place], Donald E, MD   5 months ago Type 2 diabetes mellitus with other specified complication, without long-term current use of insulin [facility])   [place], Donald E, MD   7 months ago Stomach irritation   [place], Donald E, MD   9 months ago Uncontrolled type 2 diabetes mellitus with hyperglycemia [facility])   [place], Donald E, MD       Future Appointments             In 1 week Fisher, Kirstie Peri, MD [facility], [place]   In 1 week Jonathon Bellows, [place] [facility] at [facility]

## 2022-07-21 ENCOUNTER — Ambulatory Visit: Payer: BC Managed Care – PPO | Admitting: Family Medicine

## 2022-07-24 ENCOUNTER — Other Ambulatory Visit: Payer: Self-pay

## 2022-07-25 ENCOUNTER — Ambulatory Visit: Payer: Self-pay | Admitting: *Deleted

## 2022-07-25 ENCOUNTER — Ambulatory Visit: Payer: BC Managed Care – PPO | Admitting: Gastroenterology

## 2022-07-25 NOTE — Patient Outreach (Signed)
  Care Coordination   Follow Up Visit Note   07/25/2022 Name: Vincent Hall MRN: [id number] DOB: 20-Feb-1966  Vincent Hall is a 57 y.o. year old male who sees Fisher, Demetrios Isaacs, MD for primary care. I spoke with  Vincent Hall by phone today.  What matters to the patients health and wellness today?  Staying healthy to care for father going through cancer treatments.  He is currently in [place] providing assistance to his parents.     Goals Addressed             This Visit's Progress    RNCM: Effective Management of DM   On track    Care Coordination Interventions:  Lab Results  Component Value Date   HGBA1C 7.7 (A) 04/21/2022    Provided education to patient about basic DM disease process Reviewed medications with patient and discussed importance of medication adherence. Counseled on importance of regular laboratory monitoring as prescribed Discussed plans with patient for ongoing care management follow up and provided patient with direct contact information for care management team Provided patient with written educational materials related to hypo and hyperglycemia and importance of correct treatment Reviewed scheduled/upcoming provider appointments including:   Advised patient, providing education and rationale, to check cbg BID and record, calling pcp for findings outside established parameters. The patient is checking his blood sugars bid. The patient states it is consistently decreasing. Review with the patient the need of fasting blood sugars <130 and post <180. Discussed his increase in exercise, will continue current regime Review of patient status, including review of consultants reports, relevant laboratory and other test results, and medications completed The patient is receptive to working with the [facility] for help with management of chronic conditions. Review of the goals of the program.           SDOH assessments and interventions completed:  No      Care Coordination Interventions:  Yes, provided   Interventions Today    Flowsheet Row Most Recent Value  Chronic Disease   Chronic disease during today's visit Diabetes  General Interventions   General Interventions Discussed/Reviewed General Interventions Reviewed, Doctor Visits, Communication with, Labs  Labs Hgb A1c every 3 months  [Advised of need for repeat A1C once he schedules PCP follow up]  Doctor Visits Discussed/Reviewed Doctor Visits Reviewed, PCP, Specialist  [GI visit today, advised to call to cancel as he is in [place]]  PCP/Specialist Visits Compliance with follow-up visit  Communication with PCP/Specialists  [Notified PCP of reason for no show last week, in [place] caring for sick father, will reschedule once back in town.]  Education Interventions   Education Provided Provided Education  Provided Verbal Education On Nutrition, When to see the doctor, Mental Health/Coping with Illness  [Discussed coping with father's illness and caring for himself during this time]  Nutrition Interventions   Nutrition Discussed/Reviewed Nutrition Reviewed, Decreasing sugar intake, Carbohydrate meal planning        Follow up plan: Follow up call scheduled for 5/23    Encounter Outcome:  Pt. Visit Completed   Kemper Durie, RN,  MSN, [facility] Care Management Care Management Coordinator [phone]

## 2022-07-25 NOTE — Patient Outreach (Signed)
  Care Coordination   07/25/2022 Name: Vincent Hall MRN: [id number] DOB: 04/28/1965   Care Coordination Outreach Attempts:  An unsuccessful telephone outreach was attempted for a scheduled appointment today.  Follow Up Plan:  Additional outreach attempts will be made to offer the patient care coordination information and services.   Encounter Outcome:  No Answer   Care Coordination Interventions:  No, not indicated    Kemper Durie, RN, MSN, [facility] Care Management Care Management Coordinator [phone]

## 2022-08-07 ENCOUNTER — Other Ambulatory Visit: Payer: Self-pay | Admitting: Family Medicine

## 2022-08-07 DIAGNOSIS — G8929 Other chronic pain: Secondary | ICD-10-CM

## 2022-08-07 DIAGNOSIS — M545 Low back pain, unspecified: Secondary | ICD-10-CM

## 2022-08-07 DIAGNOSIS — R0789 Other chest pain: Secondary | ICD-10-CM

## 2022-08-07 NOTE — Telephone Encounter (Unsigned)
Copied from CRM [id number]. Topic: General - Other >> Aug 07, 2022  1:56 PM Everette C wrote: Reason for CRM: Medication Refill - Medication: oxyCODONE (ROXICODONE) 15 MG immediate release tablet [[id number]]  Has the patient contacted their pharmacy? Yes.   (Agent: If no, request that the patient contact the pharmacy for the refill. If patient does not wish to contact the pharmacy document the reason why and proceed with request.) (Agent: If yes, when and what did the pharmacy advise?)  Preferred Pharmacy (with phone number or street name): [facility] [id number] [place], [place] - [address] [place] [address] Phone: [phone] Fax: [phone] Hours: Not open 24 hours   Has the patient been seen for an appointment in the last year OR does the patient have an upcoming appointment? Yes.    Agent: Please be advised that RX refills may take up to 3 business days. We ask that you follow-up with your pharmacy.

## 2022-08-08 NOTE — Telephone Encounter (Signed)
Requested medication (s) are due for refill today:   Requested medication (s) are on the active medication list: Yes  Last refill:  07/13/22  Future visit scheduled: No  Notes to clinic:  See request.    Requested Prescriptions  Pending Prescriptions Disp Refills   oxyCODONE (ROXICODONE) 15 MG immediate release tablet 180 tablet 0    Sig: Take 1 tablet (15 mg total) by mouth every 4 (four) hours as needed for pain.     Not Delegated - Analgesics:  Opioid Agonists Failed - 08/07/2022  2:23 PM      Failed - This refill cannot be delegated      Failed - Urine Drug Screen completed in last 360 days      Failed - Valid encounter within last 3 months    Recent Outpatient Visits           3 months ago Type 2 diabetes mellitus with other specified complication, without long-term current use of insulin (H[facility])   [place] [facility] Malva Limes, MD   4 months ago Epigastric pain   [place] [facility] Malva Limes, MD   6 months ago Type 2 diabetes mellitus with other specified complication, without long-term current use of insulin [facility])   [place] [facility] Malva Limes, MD   8 months ago Stomach irritation   [place] [facility] Malva Limes, MD   10 months ago Uncontrolled type 2 diabetes mellitus with hyperglycemia [facility])   [place] [facility] Sherrie Mustache, Demetrios Isaacs, MD

## 2022-08-09 ENCOUNTER — Telehealth: Payer: Self-pay | Admitting: Family Medicine

## 2022-08-09 NOTE — Telephone Encounter (Signed)
Pt is calling in checking on the status of his refill for oxyCODONE (ROXICODONE) 15 MG immediate release tablet [[id number]] Pt would like someone to follow up with him on the status and when he can expect the medication. [phone]

## 2022-08-10 MED ORDER — OXYCODONE HCL 15 MG PO TABS
15.0000 mg | ORAL_TABLET | ORAL | 0 refills | Status: DC | PRN
Start: 2022-08-10 — End: 2022-09-06

## 2022-08-14 NOTE — Telephone Encounter (Signed)
Rx filled 08/10/22

## 2022-08-31 ENCOUNTER — Ambulatory Visit: Payer: Self-pay | Admitting: *Deleted

## 2022-08-31 NOTE — Patient Outreach (Signed)
Care Coordination   Follow Up Visit Note   08/31/2022 Name: Vincent Hall MRN: [id number] DOB: Jan 04, 1966  Vincent Hall is a 57 y.o. year old male who sees Fisher, Demetrios Isaacs, MD for primary care. I spoke with  Vincent Hall by phone today.  What matters to the patients health and wellness today?  Continue to keep blood sugars under control as he care for parents in [facility].     Goals Addressed             This Visit's Progress    RNCM: Effective Management of DM   On track    Care Coordination Interventions: Provided education to patient about basic DM disease process Reviewed medications with patient and discussed importance of medication adherence. Counseled on importance of regular laboratory monitoring as prescribed Discussed plans with patient for ongoing care management follow up and provided patient with direct contact information for care management team Provided patient with written educational materials related to hypo and hyperglycemia and importance of correct treatment Reviewed scheduled/upcoming provider appointments including:   Advised patient, providing education and rationale, to check cbg BID and record, calling pcp for findings outside established parameters. The patient is checking his blood sugars bid. The patient states it is consistently decreasing. Review with the patient the need of fasting blood sugars <130 and post <180. Discussed his increase in exercise, will continue current regime Review of patient status, including review of consultants reports, relevant laboratory and other test results, and medications completed The patient is receptive to working with the [facility] for help with management of chronic conditions. Review of the goals of the program.        RNCM: Effective Management of Pain   On track    Care Coordination Interventions: Reviewed provider established plan for pain management Discussed importance of adherence to all scheduled  medical appointments Counseled on the importance of reporting any/all new or changed pain symptoms or management strategies to pain management provider Advised patient to report to care team affect of pain on daily activities Discussed use of relaxation techniques and/or diversional activities to assist with pain reduction (distraction, imagery, relaxation, massage, acupressure, TENS, heat, and cold application Reviewed with patient prescribed pharmacological and nonpharmacological pain relief strategies Advised patient to discuss unresolved pain or changes in level or intensity of pain with provider The patient states he is in need of a refill of his Oxycodone 15mg  to be called into pharmacy at [facility] in [place].  Will request from PCP.          SDOH assessments and interventions completed:  No     Care Coordination Interventions:  Yes, provided   Interventions Today    Flowsheet Row Most Recent Value  Chronic Disease   Chronic disease during today's visit Diabetes  General Interventions   General Interventions Discussed/Reviewed General Interventions Reviewed, Doctor Visits  Doctor Visits Discussed/Reviewed Doctor Visits Reviewed, PCP  Eather Colas of need for PCP visit.  He will be back in town this weekend but will need to return to [place] for his family.  He will call today to see if he can get appointments scheduled for next week before he has to return to [place].]  Pharmacy Interventions   Pharmacy Dicussed/Reviewed Medication Adherence  [Discussed need to meet with provider and have medications refilled prior to return to [place]]        Follow up plan: Follow up call scheduled for 6/18    Encounter Outcome:  Pt. Visit  Completed   Kemper Durie, RN, MSN, [facility] Care Management Care Management Coordinator [phone]

## 2022-09-06 ENCOUNTER — Other Ambulatory Visit: Payer: Self-pay | Admitting: Family Medicine

## 2022-09-06 ENCOUNTER — Encounter: Payer: Self-pay | Admitting: Family Medicine

## 2022-09-06 ENCOUNTER — Ambulatory Visit (INDEPENDENT_AMBULATORY_CARE_PROVIDER_SITE_OTHER): Payer: BC Managed Care – PPO | Admitting: Family Medicine

## 2022-09-06 VITALS — BP 136/68 | HR 66 | Ht 68.0 in | Wt 218.0 lb

## 2022-09-06 DIAGNOSIS — E1159 Type 2 diabetes mellitus with other circulatory complications: Secondary | ICD-10-CM | POA: Diagnosis not present

## 2022-09-06 DIAGNOSIS — E1165 Type 2 diabetes mellitus with hyperglycemia: Secondary | ICD-10-CM | POA: Diagnosis not present

## 2022-09-06 DIAGNOSIS — F1129 Opioid dependence with unspecified opioid-induced disorder: Secondary | ICD-10-CM

## 2022-09-06 DIAGNOSIS — J301 Allergic rhinitis due to pollen: Secondary | ICD-10-CM

## 2022-09-06 DIAGNOSIS — F3132 Bipolar disorder, current episode depressed, moderate: Secondary | ICD-10-CM

## 2022-09-06 DIAGNOSIS — Z79899 Other long term (current) drug therapy: Secondary | ICD-10-CM | POA: Diagnosis not present

## 2022-09-06 DIAGNOSIS — F5101 Primary insomnia: Secondary | ICD-10-CM

## 2022-09-06 DIAGNOSIS — I152 Hypertension secondary to endocrine disorders: Secondary | ICD-10-CM

## 2022-09-06 DIAGNOSIS — E119 Type 2 diabetes mellitus without complications: Secondary | ICD-10-CM | POA: Insufficient documentation

## 2022-09-06 DIAGNOSIS — E1169 Type 2 diabetes mellitus with other specified complication: Secondary | ICD-10-CM | POA: Diagnosis not present

## 2022-09-06 DIAGNOSIS — G8928 Other chronic postprocedural pain: Secondary | ICD-10-CM

## 2022-09-06 DIAGNOSIS — F1190 Opioid use, unspecified, uncomplicated: Secondary | ICD-10-CM

## 2022-09-06 DIAGNOSIS — Z1159 Encounter for screening for other viral diseases: Secondary | ICD-10-CM

## 2022-09-06 DIAGNOSIS — I700 Atherosclerosis of aorta: Secondary | ICD-10-CM | POA: Diagnosis not present

## 2022-09-06 DIAGNOSIS — K219 Gastro-esophageal reflux disease without esophagitis: Secondary | ICD-10-CM

## 2022-09-06 DIAGNOSIS — Z87891 Personal history of nicotine dependence: Secondary | ICD-10-CM | POA: Insufficient documentation

## 2022-09-06 DIAGNOSIS — J439 Emphysema, unspecified: Secondary | ICD-10-CM

## 2022-09-06 DIAGNOSIS — K2100 Gastro-esophageal reflux disease with esophagitis, without bleeding: Secondary | ICD-10-CM

## 2022-09-06 MED ORDER — SUCRALFATE 1 G PO TABS
1.0000 g | ORAL_TABLET | Freq: Four times a day (QID) | ORAL | 0 refills | Status: DC
Start: 2022-09-06 — End: 2023-01-30

## 2022-09-06 MED ORDER — RABEPRAZOLE SODIUM 20 MG PO TBEC: 20.0000 mg | DELAYED_RELEASE_TABLET | Freq: Every day | ORAL | 0 refills | Status: DC

## 2022-09-06 MED ORDER — OXYCODONE HCL 15 MG PO TABS
15.0000 mg | ORAL_TABLET | ORAL | 0 refills | Status: DC | PRN
Start: 2022-09-06 — End: 2022-10-04

## 2022-09-06 MED ORDER — CYCLOBENZAPRINE HCL 5 MG PO TABS
5.0000 mg | ORAL_TABLET | Freq: Three times a day (TID) | ORAL | 0 refills | Status: DC | PRN
Start: 2022-09-06 — End: 2023-02-23

## 2022-09-06 MED ORDER — TRIAMCINOLONE ACETONIDE 0.1 % EX CREA: 1.0000 | TOPICAL_CREAM | Freq: Two times a day (BID) | CUTANEOUS | 0 refills | Status: AC

## 2022-09-06 MED ORDER — TRAZODONE HCL 100 MG PO TABS: 200.0000 mg | ORAL_TABLET | Freq: Every day | ORAL | 0 refills | Status: DC

## 2022-09-06 NOTE — Assessment & Plan Note (Addendum)
Chronic, borderline Goal of <129/<79 Currently on lisinopril of 20 mg; continue to monitor with diet and exercise modifications. Can increase to 40 mg at next ov with PCP

## 2022-09-06 NOTE — Assessment & Plan Note (Signed)
Chronic, stable LCTAB Due for repeat lung cancer screening for lung nodule f/u Continues to restrain from tobacco use Has PRN albuterol

## 2022-09-06 NOTE — Assessment & Plan Note (Signed)
Chronic, stable Request for medication refills Defer medication adjustments at this time  Pt with previous hx of psychiatric instability and concern for possible misuse

## 2022-09-06 NOTE — Assessment & Plan Note (Signed)
Chronic, with depression Hx of both SI and HI as well as drug seeking behavior Defer medication changes to PCP and psychiatry team 1 month refills only

## 2022-09-06 NOTE — Assessment & Plan Note (Signed)
Chronic, elevated Repeat LP On high dose crestor 40 mg Determine need for additional agent, like zetia or repatha

## 2022-09-06 NOTE — Assessment & Plan Note (Signed)
Chronic GERD with gastritis symptoms Request for refills of carafate as well as rabeprazole

## 2022-09-06 NOTE — Assessment & Plan Note (Signed)
Due for lung cancer screening

## 2022-09-06 NOTE — Assessment & Plan Note (Signed)
Low risk screen Treatable, and curable. If left untreated Hep C can lead to cirrhosis and liver failure. Encourage routine testing; recommend repeat testing if risk factors change.  

## 2022-09-06 NOTE — Assessment & Plan Note (Signed)
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

## 2022-09-06 NOTE — Progress Notes (Signed)
Established patient visit   Patient: Vincent Hall   DOB: [birth year]   57 y.o. Male  MRN: [id number] Visit Date: 09/06/2022  Today's healthcare provider: Jacky Kindle, FNP   Introduced to nurse practitioner role and practice setting.  All questions answered.  Discussed provider/patient relationship and expectations.  Subjective    HPI   Request for refills; plans to go out of town soon as his parent has been diagnosed with terminal illness   Medications: Outpatient Medications Prior to Visit  Medication Sig   albuterol (VENTOLIN HFA) 108 (90 Base) MCG/ACT inhaler Inhale 2 puffs into the lungs every 6 (six) hours as needed for wheezing or shortness of breath.   Alum & Mag Hydroxide-Simeth (GI COCKTAIL) SUSP suspension Take 30 mLs by mouth 2 (two) times daily as needed for indigestion (abd pain). Shake well. Each dose to containe 15mL maalox and 15mL viscous lidocaine   aspirin 81 MG tablet Take 81 mg by mouth daily.   azelastine (OPTIVAR) 0.05 % ophthalmic solution INSTILL ONE DROP TO THE AFFECTED EYE(S) TWICE A DAY   Blood Glucose Monitoring Suppl (ONE TOUCH ULTRA 2) w/Device KIT USE AS DIRECTED   citalopram (CELEXA) 40 MG tablet TAKE ONE TABLET BY MOUTH DAILY   diclofenac (VOLTAREN) 75 MG EC tablet TAKE 1 TABLET BY MOUTH TWICE A DAY AS NEEDED FOR MODERATE PAIN   glipiZIDE (GLUCOTROL XL) 5 MG 24 hr tablet Take 1 tablet (5 mg total) by mouth daily with breakfast.   glucose blood (CONTOUR TEST) test strip Check blood sugars 3 times daily. E11.65 (uncontrolled insulin dependent diabetes)   insulin aspart (NOVOLOG FLEXPEN) 100 UNIT/ML FlexPen Use before meals up to three times a day. Take 6 units if sugar is over 200. 8 units if over 250 and 10 units if over 300.   insulin detemir (LEVEMIR FLEXPEN) 100 UNIT/ML FlexPen INJECT 56 UNITS INTO THE SKIN EVERY OTHER DAY   insulin lispro (HUMALOG KWIKPEN) 200 UNIT/ML KwikPen Use before meals up to three times a day. Take 6 units if  sugar is over 200. 8 units if over 250 and 10 units if over 300.   Insulin Pen Needle 32G X 4 MM MISC 1 each by Does not apply route 4 (four) times daily -  before meals and at bedtime.   naloxone (NARCAN) nasal spray 4 mg/0.1 mL Place 1 spray into the nose once.   nitroGLYCERIN (NITROSTAT) 0.4 MG SL tablet DISSOLVE 1 TAB UNDER TONGUE FOR CHEST PAIN - IF PAIN REMAINS AFTER 5 MIN, CALL 911 AND REPEAT DOSE. MAX 3 TABS IN 15 MINUTES   pioglitazone (ACTOS) 30 MG tablet Take 1 tablet (30 mg total) by mouth daily.   promethazine (PHENERGAN) 25 MG tablet Take 1 tablet (25 mg total) by mouth every 8 (eight) hours as needed for nausea or vomiting.   rosuvastatin (CRESTOR) 40 MG tablet Take 1 tablet (40 mg total) by mouth daily.   tirzepatide ([name]) 7.5 MG/0.5ML Pen Inject 7.5 mg into the skin once a week.   [DISCONTINUED] cyclobenzaprine (FLEXERIL) 5 MG tablet TAKE 1 TABLET BY MOUTH 3 TIMES DAILY AS NEEDED MUSCLE SPASMS   [DISCONTINUED] oxyCODONE (ROXICODONE) 15 MG immediate release tablet Take 1 tablet (15 mg total) by mouth every 4 (four) hours as needed for pain.   [DISCONTINUED] RABEprazole (ACIPHEX) 20 MG tablet Take 1 tablet (20 mg total) by mouth daily.   [DISCONTINUED] sucralfate (CARAFATE) 1 g tablet Take 1 tablet (1 g total) by  mouth 4 (four) times daily.   [DISCONTINUED] traZODone (DESYREL) 100 MG tablet Take 2 tablets (200 mg total) by mouth at bedtime.   [DISCONTINUED] triamcinolone cream (KENALOG) 0.1 % Apply 1 Application topically 2 (two) times daily.   lisinopril (ZESTRIL) 20 MG tablet Take 1 tablet (20 mg total) by mouth daily.   metoCLOPramide (REGLAN) 10 MG tablet Take 1 tablet (10 mg total) by mouth 4 (four) times daily -  before meals and at bedtime.   No facility-administered medications prior to visit.    Review of Systems     Objective    BP 136/68 (BP Location: Right Arm, Patient Position: Sitting, Cuff Size: Normal)   Pulse 66   Ht 5\' 8"  (1.727 m)   Wt 218 lb (98.9  kg)   SpO2 98%   BMI 33.15 kg/m    Physical Exam Vitals and nursing note reviewed.  Constitutional:      Appearance: Normal appearance. He is obese.  HENT:     Head: Normocephalic and atraumatic.  Cardiovascular:     Rate and Rhythm: Normal rate and regular rhythm.     Pulses: Normal pulses.     Heart sounds: Normal heart sounds.  Pulmonary:     Effort: Pulmonary effort is normal.     Breath sounds: Normal breath sounds.  Abdominal:     General: Bowel sounds are normal.     Palpations: Abdomen is soft.  Musculoskeletal:        General: Normal range of motion.     Cervical back: Normal range of motion.     Right lower leg: No edema.     Left lower leg: No edema.  Skin:    General: Skin is warm and dry.     Capillary Refill: Capillary refill takes less than 2 seconds.  Neurological:     General: No focal deficit present.     Mental Status: He is alert and oriented to person, place, and time. Mental status is at baseline.  Psychiatric:        Mood and Affect: Mood normal.        Behavior: Behavior normal.        Thought Content: Thought content normal.        Judgment: Judgment normal.     No results found for any visits on 09/06/22.  Assessment & Plan     Problem List Items Addressed This Visit       Cardiovascular and Mediastinum   Aortic atherosclerosis (HCC)    Chronic, borderline Hx of CABG Remains on crestor 40 mg LDL goal of <55 Repeat LP recommend diet low in saturated fat and regular exercise - 30 min at least 5 times per week       Relevant Orders   Lipid panel   Hypertension associated with diabetes (HCC) - Primary    Chronic, borderline Goal of <129/<79 Currently on lisinopril of 20 mg; continue to monitor with diet and exercise modifications. Can increase to 40 mg at next ov with PCP      Relevant Orders   CBC with Differential/Platelet   Comprehensive Metabolic Panel (CMET)   TSH     Respiratory   Emphysema lung (HCC)    Chronic,  stable LCTAB Due for repeat lung cancer screening for lung nodule f/u Continues to restrain from tobacco use Has PRN albuterol       Relevant Orders   Ambulatory Referral Lung Cancer Screening [facility]     Digestive  Esophageal reflux    Chronic GERD with gastritis symptoms Request for refills of carafate as well as rabeprazole       Relevant Medications   sucralfate (CARAFATE) 1 g tablet   RABEprazole (ACIPHEX) 20 MG tablet     Endocrine   Hyperlipidemia associated with type 2 diabetes mellitus (HCC)    Chronic, elevated Repeat LP On high dose crestor 40 mg Determine need for additional agent, like zetia or repatha       Relevant Orders   Lipid panel   Uncontrolled type 2 diabetes mellitus with hyperglycemia (HCC)    Chronic, uncontrolled BG with last A1c >7% Multi modal medications Has not lost weight with mounjaro 7.5mg ; encourage PCP titration if needed Also remains on 5 mg glipizide, fasting acting insulin [pt unclear if this is aspart or lispro] SSI with meals, levemir 56 units every other day. Also on Actos 30 mg.   On ACEi On statin  Encouraged to schedule DM eye exam Encouraged to complete vaccination for Hep at pharmacy given MASLD      Relevant Orders   Hemoglobin A1c   Urine Microalbumin w/creat. ratio     Nervous and Auditory   Opioid dependence with opioid-induced disorder (HCC)    Chronic, stable Request for medication refills Defer medication adjustments at this time  Pt with previous hx of psychiatric instability and concern for possible misuse       Relevant Medications   oxyCODONE (ROXICODONE) 15 MG immediate release tablet   cyclobenzaprine (FLEXERIL) 5 MG tablet   Other Relevant Orders   Drug Screen 12+Alcohol+CRT, Ur     Other   Bipolar disorder (HCC)    Chronic, with depression Hx of both SI and HI as well as drug seeking behavior Defer medication changes to PCP and psychiatry team 1 month refills only      Relevant  Orders   CBC with Differential/Platelet   Comprehensive Metabolic Panel (CMET)   TSH   Chronic pain    Chronic, reports chronic back pain since CABG Moves with ease No physical limitation Denies constipation, numbness, tingling, falls or concerns      Relevant Medications   oxyCODONE (ROXICODONE) 15 MG immediate release tablet   cyclobenzaprine (FLEXERIL) 5 MG tablet   traZODone (DESYREL) 100 MG tablet   Encounter for hepatitis C screening test for low risk patient    Low risk screen Treatable, and curable. If left untreated Hep C can lead to cirrhosis and liver failure. Encourage routine testing; recommend repeat testing if risk factors change.       Relevant Orders   Hepatitis C Antibody   Insomnia    Chronic, stable Remains on high dose trazodone 200 mg to assist       Relevant Medications   traZODone (DESYREL) 100 MG tablet   Stopped smoking with greater than 20 pack year history    Due for lung cancer screening      Relevant Orders   Ambulatory Referral Lung Cancer Screening [facility]   Other Visit Diagnoses     Chronic, continuous use of opioids       Relevant Orders   Drug Screen 12+Alcohol+CRT, Ur   High risk medication use       Relevant Orders   Drug Screen 12+Alcohol+CRT, Ur      Return in about 3 months (around 12/07/2022) for chonic disease management.     Leilani Merl, FNP, have reviewed all documentation for this visit. The documentation on 09/06/22  for the exam, diagnosis, procedures, and orders are all accurate and complete.  Jacky Kindle, FNP  [facility] [phone] (phone) [phone] (fax)  [facility]

## 2022-09-06 NOTE — Assessment & Plan Note (Addendum)
Chronic, borderline Hx of CABG Remains on crestor 40 mg LDL goal of <55 Repeat LP recommend diet low in saturated fat and regular exercise - 30 min at least 5 times per week

## 2022-09-06 NOTE — Assessment & Plan Note (Signed)
Chronic, stable Remains on high dose trazodone 200 mg to assist

## 2022-09-06 NOTE — Assessment & Plan Note (Signed)
Chronic, uncontrolled BG with last A1c >7% Multi modal medications Has not lost weight with mounjaro 7.5mg ; encourage PCP titration if needed Also remains on 5 mg glipizide, fasting acting insulin [pt unclear if this is aspart or lispro] SSI with meals, levemir 56 units every other day. Also on Actos 30 mg.   On ACEi On statin  Encouraged to schedule DM eye exam Encouraged to complete vaccination for Hep at pharmacy given MASLD

## 2022-09-07 ENCOUNTER — Other Ambulatory Visit: Payer: Self-pay | Admitting: Family Medicine

## 2022-09-07 ENCOUNTER — Telehealth: Payer: Self-pay | Admitting: Family Medicine

## 2022-09-07 DIAGNOSIS — I700 Atherosclerosis of aorta: Secondary | ICD-10-CM

## 2022-09-07 DIAGNOSIS — E1169 Type 2 diabetes mellitus with other specified complication: Secondary | ICD-10-CM

## 2022-09-07 DIAGNOSIS — E1159 Type 2 diabetes mellitus with other circulatory complications: Secondary | ICD-10-CM

## 2022-09-07 DIAGNOSIS — I152 Hypertension secondary to endocrine disorders: Secondary | ICD-10-CM

## 2022-09-07 DIAGNOSIS — E1165 Type 2 diabetes mellitus with hyperglycemia: Secondary | ICD-10-CM

## 2022-09-07 LAB — COMPREHENSIVE METABOLIC PANEL
ALT: 23 IU/L (ref 0–44)
AST: 19 IU/L (ref 0–40)
Albumin/Globulin Ratio: 2 (ref 1.2–2.2)
Albumin: 4.6 g/dL (ref 3.8–4.9)
Alkaline Phosphatase: 70 IU/L (ref 44–121)
BUN/Creatinine Ratio: 12 (ref 9–20)
BUN: 10 mg/dL (ref 6–24)
Bilirubin Total: 0.2 mg/dL (ref 0.0–1.2)
CO2: 21 mmol/L (ref 20–29)
Calcium: 9.5 mg/dL (ref 8.7–10.2)
Chloride: 98 mmol/L (ref 96–106)
Creatinine, Ser: 0.83 mg/dL (ref 0.76–1.27)
Globulin, Total: 2.3 g/dL (ref 1.5–4.5)
Glucose: 268 mg/dL — ABNORMAL HIGH (ref 70–99)
Potassium: 4.7 mmol/L (ref 3.5–5.2)
Sodium: 134 mmol/L (ref 134–144)
Total Protein: 6.9 g/dL (ref 6.0–8.5)
eGFR: 101 mL/min/{1.73_m2} (ref >59)

## 2022-09-07 LAB — LIPID PANEL
Chol/HDL Ratio: 3.3 ratio (ref 0.0–5.0)
Cholesterol, Total: 176 mg/dL (ref 100–199)
HDL: 54 mg/dL (ref >39)
LDL Chol Calc (NIH): 97 mg/dL (ref 0–99)
Triglycerides: 145 mg/dL (ref 0–149)
VLDL Cholesterol Cal: 25 mg/dL (ref 5–40)

## 2022-09-07 LAB — CBC WITH DIFFERENTIAL/PLATELET
Basophils Absolute: 0 10*3/uL (ref 0.0–0.2)
Basos: 0 %
EOS (ABSOLUTE): 0.2 10*3/uL (ref 0.0–0.4)
Eos: 3 %
Hematocrit: 44.8 % (ref 37.5–51.0)
Hemoglobin: 14.6 g/dL (ref 13.0–17.7)
Immature Grans (Abs): 0 10*3/uL (ref 0.0–0.1)
Immature Granulocytes: 1 %
Lymphocytes Absolute: 2.4 10*3/uL (ref 0.7–3.1)
Lymphs: 31 %
MCH: 27.1 pg (ref 26.6–33.0)
MCHC: 32.6 g/dL (ref 31.5–35.7)
MCV: 83 fL (ref 79–97)
Monocytes Absolute: 0.5 10*3/uL (ref 0.1–0.9)
Monocytes: 7 %
Neutrophils Absolute: 4.6 10*3/uL (ref 1.4–7.0)
Neutrophils: 58 %
Platelets: 230 10*3/uL (ref 150–450)
RBC: 5.39 x10E6/uL (ref 4.14–5.80)
RDW: 13.2 % (ref 11.6–15.4)
WBC: 7.9 10*3/uL (ref 3.4–10.8)

## 2022-09-07 LAB — DRUG SCREEN 12+ALCOHOL+CRT, UR: BENZODIAZ UR QL: NEGATIVE ng/mL

## 2022-09-07 LAB — MICROALBUMIN / CREATININE URINE RATIO
Creatinine, Urine: 70.1 mg/dL
Microalb/Creat Ratio: <4 mg/g creat (ref 0–29)
Microalbumin, Urine: <3 ug/mL

## 2022-09-07 LAB — HEMOGLOBIN A1C
Est. average glucose Bld gHb Est-mCnc: 206 mg/dL
Hgb A1c MFr Bld: 8.8 % — ABNORMAL HIGH (ref 4.8–5.6)

## 2022-09-07 LAB — HEPATITIS C ANTIBODY: Hep C Virus Ab: NONREACTIVE

## 2022-09-07 LAB — TSH: TSH: 1.63 u[IU]/mL (ref 0.450–4.500)

## 2022-09-07 MED ORDER — TIRZEPATIDE 10 MG/0.5ML ~~LOC~~ SOAJ: 10.0000 mg | SUBCUTANEOUS | 0 refills | Status: DC

## 2022-09-07 MED ORDER — NALOXONE HCL 4 MG/0.1ML NA LIQD: 1.0000 | NASAL | 11 refills | Status: AC | PRN

## 2022-09-07 MED ORDER — GLIPIZIDE ER 10 MG PO TB24: 10.0000 mg | ORAL_TABLET | Freq: Two times a day (BID) | ORAL | 0 refills | Status: DC

## 2022-09-07 MED ORDER — REPATHA 140 MG/ML ~~LOC~~ SOSY: 140.0000 mg | PREFILLED_SYRINGE | SUBCUTANEOUS | 11 refills | Status: DC

## 2022-09-07 MED ORDER — EZETIMIBE 10 MG PO TABS
10.0000 mg | ORAL_TABLET | Freq: Every day | ORAL | 3 refills | Status: DC
Start: 2022-09-07 — End: 2023-08-15

## 2022-09-07 NOTE — Telephone Encounter (Signed)
PA started

## 2022-09-07 NOTE — Progress Notes (Signed)
Diabetes is uncontrolled with A1c increase by 1.1% now at 8.8%. Goal <7%. Cholesterol remains above LDL goal of 55. Recommend further titration of statin to assist. Urine creatinine pending. Recommend meal time insulin with every meal vs titration of glipizide to BID dosing or further titration of long acting insulin at night to assist with overall control, EACH day vs every other day. Recommend addition of zetia or repatha to assist diet/exercise to meet LDL goal.

## 2022-09-07 NOTE — Telephone Encounter (Signed)
[facility] requesting prior authorization Key: [id number] Name: Rivadeneira Repatha 140 mg/ml syringes

## 2022-09-07 NOTE — Progress Notes (Signed)
Stable urine micro; repeat in 1 year

## 2022-09-08 ENCOUNTER — Other Ambulatory Visit: Payer: Self-pay | Admitting: Family Medicine

## 2022-09-08 DIAGNOSIS — F321 Major depressive disorder, single episode, moderate: Secondary | ICD-10-CM

## 2022-09-08 MED ORDER — CITALOPRAM HYDROBROMIDE 40 MG PO TABS: 40.0000 mg | ORAL_TABLET | Freq: Every day | ORAL | 0 refills | Status: DC

## 2022-09-08 NOTE — Telephone Encounter (Signed)
Message from Plan Approved. . Authorization Expiration Date: Sep 07, 2023.

## 2022-09-08 NOTE — Telephone Encounter (Signed)
[facility] pharmacy advised.

## 2022-09-10 LAB — DRUG SCREEN 12+ALCOHOL+CRT, UR
Amphetamines, Urine: NEGATIVE ng/mL
Barbiturate: NEGATIVE ng/mL
Cannabinoids: NEGATIVE ng/mL
Cocaine (Metabolite): NEGATIVE ng/mL
Ethanol, Urine: NEGATIVE %
Methadone: NEGATIVE ng/mL
Phencyclidine: NEGATIVE ng/mL
Propoxyphene: NEGATIVE ng/mL
Tramadol: NEGATIVE ng/mL

## 2022-09-11 ENCOUNTER — Encounter: Payer: Self-pay | Admitting: Pharmacist

## 2022-09-11 ENCOUNTER — Other Ambulatory Visit: Payer: BC Managed Care – PPO | Admitting: Pharmacist

## 2022-09-11 NOTE — Progress Notes (Unsigned)
09/11/2022 Name: Vincent Hall MRN: [id number] DOB: [birth year]  Chief Complaint  Patient presents with   Medication Management   Medication Assistance    Vincent Hall is a 57 y.o. year old male who presented for a telephone visit.   They were referred to the pharmacist by their PCP for assistance in managing diabetes, hyperlipidemia, medication access, and complex medication management.    Subjective:  Care Team: Primary Care Provider: Malva Limes, MD [facility] Nurse Care Manager: Kemper Durie RN; Next Scheduled Visit: 09/26/2022  Medication Access/Adherence  Current Pharmacy:  [facility] [id number] - [place], [place] - [address] [place] [address] Phone: [phone] Fax: [phone]  [facility] - [place], [place] - [address] [place] [address] Phone: [phone] Fax: [phone]  [facility] [id number] - [address], [place] - [address] [place] [address] Phone: [phone] Fax: [phone]  [facility] [id number] [facility] [place], [place] - [address] [place] [address] Phone: [phone] Fax: [phone]  [facility] - [place], [place] - [address] [place] [address] Phone: [phone] Fax: [phone]  [facility] [id number] - [address] [place], [place] - [address] [place] [address] Phone: [phone] Fax: [phone]   Patient reports affordability concerns with their medications: Yes  Patient reports access/transportation concerns to their pharmacy: No  Patient reports adherence concerns with their medications:  No    Reports has recently picked up all of his maintenance medication, except for the Novolog, but reports his annual deductible restarts June 1st  Denies using weekly pillbox, rather reports has his own system with  taking medications directly from pill bottles consistently at 12 pm and 8 pm; denies missed doses  Diabetes:  Current medications:  - glipizide ER 10 mg twice daily (increased on 5/31 as directed by PCP) - pioglitazone 30 mg daily - [name] 7.5 mg weekly on Fridays Note PCP increased [name] dose to 10 mg weekly on 5/30, but patient reports picked up/continued 7.5 mg strength as pharmacy was unable to stock 10 mg (back order) - Levemir Flexpen 40 units daily - Not currently taking: Novolog according to sliding scale (Use before meals up to three times a day. Take 6 units if sugar is over 200. 8 units if over 250 and 10 units if over 300) as out of this medication  Reports rarelly needs as pre-meal blood sugar typically <200  Medications tried in the past: Invokana, Ozempic (abdominal pain), metformin (GI side effects)  Current glucose readings: Reports recently ranging before breakfast 130-150; before supper: 170-200   Patient denies hypoglycemic s/sx including dizziness, shakiness, sweating.   Reports has glucose tablets to use if needed in case of hypoglycemia  Current meal patterns:  - Breakfast: Espresso with milk and crackers; recently sometimes adding Ensure (will check to see if Diabetes care/low carbohydrate) - Lunch: Rice and beans with meat (pork, chicken or steak) - Supper: Rice and beans with meat (pork, chicken or steak) - Snacks: Hard candy after meals; oreos, honey grahams - Drinks: water and morning espresso  Reports is working on cutting back on habit of eating hard candy after meals   Hyperlipidemia/ASCVD Risk Reduction  Current lipid lowering medications:  - rosuvastatin 40 mg daily -  ezetimibe 10 mg daily (started 09/08/2022) - Repatha 140 mg prefilled syringe - every 14 days - Reports picked up and planning to start today   Medications tried in the past: atorvastatin (muscle pain)  Antiplatelet regimen: aspirin 81 mg daily  ASCVD History: 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, CAD/CABG x 3 in 2015(LIMA to LAD, SVG to PDA, SVG to diagonal (per [facility] note)    Objective:  Lab Results  Component Value Date   HGBA1C 8.8 (H) 09/06/2022    Lab Results  Component Value Date   CREATININE 0.83 09/06/2022   BUN 10 09/06/2022   NA 134 09/06/2022   K 4.7 09/06/2022   CL 98 09/06/2022   CO2 21 09/06/2022    Lab Results  Component Value Date   CHOL 176 09/06/2022   HDL 54 09/06/2022   LDLCALC 97 09/06/2022   TRIG 145 09/06/2022   CHOLHDL 3.3 09/06/2022   BP Readings from Last 3 Encounters:  09/06/22 136/68  04/21/22 125/70  03/21/22 118/64   Pulse Readings from Last 3 Encounters:  09/06/22 66  04/21/22 83  03/21/22 64     Medications Reviewed Today     Reviewed by Manuela Neptune, RPH-CPP (Pharmacist) on 09/11/22 at 1522  Med List Status: <None>   Medication Order Taking? Sig Documenting Provider Last Dose Status Informant  albuterol (VENTOLIN HFA) 108 (90 Base) MCG/ACT inhaler [id number] Yes Inhale 2 puffs into the lungs every 6 (six) hours as needed for wheezing or shortness of breath. Malva Limes, MD Taking Active Other  Alum & Mag Hydroxide-Simeth (GI COCKTAIL) SUSP suspension [id number] No Take 30 mLs by mouth 2 (two) times daily as needed for indigestion (abd pain). Shake well. Each dose to containe 15mL maalox and 15mL viscous lidocaine  Patient not taking: Reported on 09/11/2022   Malva Limes, MD Not Taking Active   aspirin 81 MG tablet [id number] Yes Take 81 mg by mouth daily. [provider] Taking Active Other  azelastine (OPTIVAR) 0.05 % ophthalmic solution [id number] Yes INSTILL ONE DROP TO THE AFFECTED EYE(S) TWICE A DAY  Patient taking differently: INSTILL ONE DROP TO THE AFFECTED EYE(S) TWICE A DAY as needed   Malva Limes, MD Taking Active   Blood Glucose Monitoring Suppl (ONE TOUCH ULTRA 2) w/Device KIT [id number]  USE AS DIRECTED Malva Limes, MD  Active Other  citalopram (CELEXA) 40 MG  tablet [id number] Yes Take 1 tablet (40 mg total) by mouth daily. Jacky Kindle, FNP Taking Active   cyclobenzaprine (FLEXERIL) 5 MG tablet [id number] Yes Take 1 tablet (5 mg total) by mouth 3 (three) times daily as needed for muscle spasms. TAKE 1 TABLET BY MOUTH 3 TIMES DAILY AS NEEDED MUSCLE SPASMS Jacky Kindle, FNP Taking Active   diclofenac (VOLTAREN) 75 MG EC tablet [id number] Yes TAKE 1 TABLET BY MOUTH TWICE A DAY AS NEEDED FOR MODERATE PAIN Malva Limes, MD Taking Active   Evolocumab (REPATHA) 140 MG/ML SOSY [id number]  Inject 140 mg into the skin every 14 (fourteen) days. Jacky Kindle, FNP  Active   ezetimibe (ZETIA) 10 MG tablet [id number] Yes Take 1 tablet (10 mg total) by mouth daily. Jacky Kindle, FNP Taking Active   glipiZIDE (GLUCOTROL XL) 10 MG 24 hr tablet [id number] Yes Take 1 tablet (10 mg total) by mouth 2 (two) times daily before lunch and supper. Jacky Kindle, FNP Taking Active   glucose blood (CONTOUR TEST) test strip [id number]  Check blood sugars 3  times daily. E11.65 (uncontrolled insulin dependent diabetes) Malva Limes, MD  Active   insulin aspart (NOVOLOG FLEXPEN) 100 UNIT/ML FlexPen [id number]  Use before meals up to three times a day. Take 6 units if sugar is over 200. 8 units if over 250 and 10 units if over 300. Malva Limes, MD  Active   insulin detemir (LEVEMIR FLEXPEN) 100 UNIT/ML FlexPen [id number] Yes INJECT 56 UNITS INTO THE SKIN EVERY OTHER DAY  Patient taking differently: Inject 40 Units into the skin daily. INJECT 56 UNITS INTO THE SKIN EVERY OTHER DAY   Malva Limes, MD Taking Active   insulin lispro (HUMALOG KWIKPEN) 200 UNIT/ML KwikPen [id number]  Use before meals up to three times a day. Take 6 units if sugar is over 200. 8 units if over 250 and 10 units if over 300. Malva Limes, MD  Active   Insulin Pen Needle 32G X 4 MM MISC [id number]  1 each by Does not apply route 4 (four) times daily -  before meals and at bedtime. Tresa Moore, MD  Active   lisinopril (ZESTRIL) 20 MG tablet [id number]  Take 1 tablet (20 mg total) by mouth daily. Tresa Moore, MD  Expired 02/26/21 2359   naloxone (NARCAN) nasal spray 4 mg/0.1 mL [id number]  Place 1 spray into the nose as needed. Jacky Kindle, FNP  Active   nitroGLYCERIN (NITROSTAT) 0.4 MG SL tablet [id number]  DISSOLVE 1 TAB UNDER TONGUE FOR CHEST PAIN - IF PAIN REMAINS AFTER 5 MIN, CALL 911 AND REPEAT DOSE. MAX 3 TABS IN 15 MINUTES Fisher, Demetrios Isaacs, MD  Active   oxyCODONE (ROXICODONE) 15 MG immediate release tablet [id number] Yes Take 1 tablet (15 mg total) by mouth every 4 (four) hours as needed for pain. Jacky Kindle, FNP Taking Active   pioglitazone (ACTOS) 30 MG tablet [id number] Yes Take 1 tablet (30 mg total) by mouth daily. Malva Limes, MD Taking Active   RABEprazole (ACIPHEX) 20 MG tablet [id number] Yes Take 1 tablet (20 mg total) by mouth daily. Jacky Kindle, FNP Taking Active   rosuvastatin (CRESTOR) 40 MG tablet [id number] Yes Take 1 tablet (40 mg total) by mouth daily. Malva Limes, MD Taking Active   sucralfate (CARAFATE) 1 g tablet [id number] Yes Take 1 tablet (1 g total) by mouth 4 (four) times daily. Jacky Kindle, FNP Taking Active   tirzepatide [facility]) 10 MG/0.5ML Pen [id number]  Inject 10 mg into the skin once a week. Jacky Kindle, FNP  Active   traZODone (DESYREL) 100 MG tablet [id number] Yes Take 2 tablets (200 mg total) by mouth at bedtime. Jacky Kindle, FNP Taking Active   triamcinolone cream (KENALOG) 0.1 % [id number] Yes Apply 1 Application topically 2 (two) times daily. Jacky Kindle, FNP Taking Active   Med List Note Jacky Kindle, [place] 09/06/22 1545): UDS pending 09/06/22              Assessment/Plan:   Encourage patient to contact [facility] to schedule appointment as referred by PCP  Comprehensive medication review performed; medication list updated in electronic medical record - Identify patient not currently  taking lisinopril (last filled 01/26/2021; current Rx from med list expired) - Counsel on taking sucralfate doses on empty stomach and separating from other oral medications by ~2 hours - Caution for increased risk of dizziness and sedation with cyclobenzaprine and oxycodone, particularly if taken in combination  Patient verbalizes understanding, but denies  dizziness or sedation   Diabetes: - Currently uncontrolled - Reviewed long term cardiovascular and renal outcomes of uncontrolled blood sugar - Reviewed goal A1c, goal fasting, and goal 2 hour post prandial glucose - Reviewed dietary modifications including encourage patient to have regular well-balanced meals and snacks, while controlling portion sizes of carbohydrates  Encourage patient to increase intake of non-starchy vegetables - Counsel patient on s/s of low blood sugar and how to treat lows Review rules of 15s - importance of using 15 grams of sugar to treat low, recheck blood sugar in 15 minutes, treat again if remains low or, if back to normal, having meal if mealtime or snack  Review examples of sources of 15 grams of sugar (note patient has glucose tablets) - Provide patient with link for Novolog manufacturer copay savings card via MyChart link - Note patient just picked up refill of Levemir Flexpen, but this product is being discontinued by manufacturer  Counsel patient Will collaborate with PCP to recommend change to a health plan covered formulary alternative, Semglee or Toujeo - Recommend to check glucose, keep log of results, have this record to review and to contact office sooner if needed for readings outside of established parameters or symptoms   Hyperlipidemia/ASCVD Risk Reduction: - Currently uncontrolled based on latest lab results - Patient reports plans to start Repatha injection as directed by PCP today. Counseled on potential side effects of runny nose, sore throat, symptoms of the common cold, flu or flu-like  symptoms, back pain and redness, pain, or bruising at the injection site. Advised to contact provider office if experiences any side effect that are bothersome or do not go away. Advise patient to seek emergency care if experiences any symptoms of allergic reaction, such as trouble breathing or swallowing, raised bumps (hives), rash or itching, swelling of the face, lips, tongue, throat or arms. Patient verbalized understanding. Send patient the link for the "How to Use the Prefilled Syringe" video and manufacturer Instructions for Use Handout via MyChart   Follow Up Plan: Clinical Pharmacist will follow up with patient by telephone on 10/11/2022 at 2 pm  Estelle Grumbles, PharmD, [facility] [phone]

## 2022-09-12 NOTE — Patient Instructions (Addendum)
Goals Addressed             This Visit's Progress    Pharmacy Goals       Our goal A1c is less than 7%. This corresponds with fasting sugars less than 130 and 2 hour after meal sugars less than 180. Please keep a log of your results when checking your blood sugar   Our goal bad cholesterol, or LDL, is less than 70. This is why it is important to continue taking your rosuvastatin, ezetimibe and Repatha.  Estelle Grumbles, PharmD, [facility] [phone]

## 2022-09-18 ENCOUNTER — Telehealth: Payer: Self-pay | Admitting: Pharmacist

## 2022-09-18 DIAGNOSIS — E1169 Type 2 diabetes mellitus with other specified complication: Secondary | ICD-10-CM

## 2022-09-18 NOTE — Progress Notes (Unsigned)
Dr. Sherrie Mustache,  Levemir Flexpen has been discontinued by the manufacturer. On 09/11/2022 patient reported he is currently taking Levemir 40 units daily. Would you please switch this to a covered alternative, such as Semglee (for converting from Levemir to [facility], may want to consider 10% dose reduction)?  Thank you!   Estelle Grumbles, PharmD, [facility] [phone]

## 2022-09-19 MED ORDER — INSULIN GLARGINE (2 UNIT DIAL) 300 UNIT/ML ~~LOC~~ SOPN: 40.0000 [IU] | PEN_INJECTOR | Freq: Every day | SUBCUTANEOUS | 1 refills | Status: DC

## 2022-09-19 NOTE — Telephone Encounter (Signed)
Insuline glargine [facility] to replace Levemir

## 2022-09-26 ENCOUNTER — Encounter: Payer: BC Managed Care – PPO | Admitting: *Deleted

## 2022-09-28 ENCOUNTER — Ambulatory Visit: Payer: Self-pay | Admitting: *Deleted

## 2022-09-28 NOTE — Patient Outreach (Signed)
  Care Coordination   09/28/2022 Name: Vincent Hall MRN: [id number] DOB: 25-Jan-1966   Care Coordination Outreach Attempts:  An unsuccessful telephone outreach was attempted for a scheduled appointment today.  Follow Up Plan:  Additional outreach attempts will be made to offer the patient care coordination information and services.   Encounter Outcome:  No Answer   Care Coordination Interventions:  No, not indicated    Kemper Durie, RN, MSN, [facility] Care Management Care Management Coordinator [phone]

## 2022-10-02 ENCOUNTER — Telehealth: Payer: Self-pay | Admitting: *Deleted

## 2022-10-02 NOTE — Patient Outreach (Signed)
  Care Coordination   Follow Up Visit Note   10/03/2022 Name: Vincent Hall MRN: [id number] DOB: Aug 09, 1965  Vincent Hall is a 57 y.o. year old male who sees Fisher, Demetrios Isaacs, MD for primary care. I spoke with  Vincent Hall by phone today.  What matters to the patients health and wellness today?  Stay healthy and care for parents    Goals Addressed             This Visit's Progress    RNCM: Effective Management of DM   On track    Care Coordination Interventions: Provided education to patient about basic DM disease process Reviewed medications with patient and discussed importance of medication adherence. Counseled on importance of regular laboratory monitoring as prescribed Discussed plans with patient for ongoing care management follow up and provided patient with direct contact information for care management team Provided patient with written educational materials related to hypo and hyperglycemia and importance of correct treatment Reviewed scheduled/upcoming provider appointments including:   Advised patient, providing education and rationale, to check cbg BID and record, calling pcp for findings outside established parameters. The patient is checking his blood sugars bid. The patient states it is consistently decreasing. Review with the patient the need of fasting blood sugars <130 and post <180. Review of patient status, including review of consultants reports, relevant laboratory and other test results, and medications completed The patient is receptive to working with the [facility] for help with management of chronic conditions. Review of the goals of the program.           SDOH assessments and interventions completed:  No     Care Coordination Interventions:  Yes, provided   Interventions Today    Flowsheet Row Most Recent Value  Chronic Disease   Chronic disease during today's visit Diabetes  General Interventions   General Interventions  Discussed/Reviewed General Interventions Reviewed, Doctor Visits, Labs  Labs Hgb A1c every 3 months  Doctor Visits Discussed/Reviewed Doctor Visits Reviewed, PCP  [PCP completed on 5/29, pharmacy visit on 7/3]  PCP/Specialist Visits Compliance with follow-up visit  Education Interventions   Education Provided Provided Education  Provided Verbal Education On Nutrition, Blood Sugar Monitoring, Medication  [Confirmed he was able to get medications refilled prior to retuning to [place] to care for parents.  He will continue to monitor blood sugar, today was 150.  Reminded to care for himself so that he is healthy enough to care for parents]  Nutrition Interventions   Nutrition Discussed/Reviewed Nutrition Reviewed, Adding fruits and vegetables, Decreasing sugar intake       Follow up plan: Follow up call scheduled for 7/22    Encounter Outcome:  Pt. Visit Completed   Kemper Durie, RN, MSN, [facility] Care Management Care Management Coordinator [phone]

## 2022-10-04 ENCOUNTER — Other Ambulatory Visit: Payer: Self-pay | Admitting: Family Medicine

## 2022-10-04 DIAGNOSIS — G8928 Other chronic postprocedural pain: Secondary | ICD-10-CM

## 2022-10-04 DIAGNOSIS — F1129 Opioid dependence with unspecified opioid-induced disorder: Secondary | ICD-10-CM

## 2022-10-04 NOTE — Telephone Encounter (Signed)
Medication Refill - Medication: oxyCODONE (ROXICODONE) 15 MG immediate release tablet   Has the patient contacted their pharmacy? Yes.   (Agent: If yes, when and what did the pharmacy advise?)  Preferred Pharmacy (with phone number or street name):   [id number] [place], [place] - [address]  [address] [place] [address]  Phone: [phone] Fax: [phone]  Hours: Not open 24 hours       Has the patient been seen for an appointment in the last year OR does the patient have an upcoming appointment? Yes.    Agent: Please be advised that RX refills may take up to 3 business days. We ask that you follow-up with your pharmacy.

## 2022-10-04 NOTE — Addendum Note (Signed)
Addended by: Nena Polio on: 10/04/2022 02:08 PM   Modules accepted: Orders

## 2022-10-05 MED ORDER — OXYCODONE HCL 15 MG PO TABS
15.0000 mg | ORAL_TABLET | ORAL | 0 refills | Status: DC | PRN
Start: 2022-10-05 — End: 2022-11-01

## 2022-10-05 NOTE — Telephone Encounter (Signed)
Requested medication (s) are due for refill today: routing for review  Requested medication (s) are on the active medication list: yes  Last refill:  09/06/22  Future visit scheduled: no  Notes to clinic:  Unable to refill per protocol, cannot delegate.        Requested Prescriptions  Pending Prescriptions Disp Refills   oxyCODONE (ROXICODONE) 15 MG immediate release tablet 180 tablet 0    Sig: Take 1 tablet (15 mg total) by mouth every 4 (four) hours as needed for pain.     Not Delegated - Analgesics:  Opioid Agonists Failed - 10/04/2022  2:08 PM      Failed - This refill cannot be delegated      Failed - Urine Drug Screen completed in last 360 days      Passed - Valid encounter within last 3 months    Recent Outpatient Visits           4 weeks ago Hypertension associated with diabetes [facility])   [place] [facility] Merita Norton T, FNP   5 months ago Type 2 diabetes mellitus with other specified complication, without long-term current use of insulin [facility])   [place] [facility] Malva Limes, MD   6 months ago Epigastric pain   [place] [facility] Malva Limes, MD   7 months ago Type 2 diabetes mellitus with other specified complication, without long-term current use of insulin [facility])   [place] [facility] Malva Limes, MD   10 months ago Stomach irritation   [facility] Sherrie Mustache, Demetrios Isaacs, MD

## 2022-10-11 ENCOUNTER — Other Ambulatory Visit: Payer: BC Managed Care – PPO | Admitting: Pharmacist

## 2022-10-11 ENCOUNTER — Telehealth: Payer: Self-pay | Admitting: Pharmacist

## 2022-10-11 NOTE — Progress Notes (Signed)
   Outreach Note  10/11/2022 Name: Vincent Hall MRN: [id number] DOB: 01-May-1965  Referred by: Malva Limes, MD  Outreach to patient today to follow up as scheduled. Patient shares that his father has recently passed away; now is not a good time to talk.   Denies medication-related needs today. States that he currently has all of his medications.  Follow Up Plan: Will outreach to patient again by telephone within the next 30 days.  Estelle Grumbles, PharmD, [facility] Clinical Pharmacist [facility] [phone]

## 2022-10-19 ENCOUNTER — Other Ambulatory Visit: Payer: Self-pay

## 2022-10-19 DIAGNOSIS — Z87891 Personal history of nicotine dependence: Secondary | ICD-10-CM

## 2022-10-19 DIAGNOSIS — Z122 Encounter for screening for malignant neoplasm of respiratory organs: Secondary | ICD-10-CM

## 2022-10-30 ENCOUNTER — Ambulatory Visit: Payer: Self-pay | Admitting: *Deleted

## 2022-10-30 ENCOUNTER — Other Ambulatory Visit: Payer: BC Managed Care – PPO | Admitting: Pharmacist

## 2022-10-30 ENCOUNTER — Telehealth: Payer: Self-pay | Admitting: Pharmacist

## 2022-10-30 NOTE — Patient Outreach (Signed)
  Care Coordination   Follow Up Visit Note   10/30/2022 Name: Vincent Hall MRN: [id number] DOB: 09-15-1965  Vincent Hall is a 57 y.o. year old male who sees Fisher, Demetrios Isaacs, MD for primary care. I spoke with  Vincent Hall by phone today.  What matters to the patients health and wellness today?  Recover for death of father and keep DM controlled     Goals Addressed             This Visit's Progress    RNCM: Effective Management of DM   On track    Care Coordination Interventions: Provided education to patient about basic DM disease process Reviewed medications with patient and discussed importance of medication adherence. Counseled on importance of regular laboratory monitoring as prescribed Discussed plans with patient for ongoing care management follow up and provided patient with direct contact information for care management team Advised patient, providing education and rationale, to check cbg BID and record, calling pcp for findings outside established parameters. The patient is checking his blood sugars bid. The patient states it is consistently decreasing. Review with the patient the need of fasting blood sugars <130 and post <180. Review of patient status, including review of consultants reports, relevant laboratory and other test results, and medications completed The patient is receptive to working with the [facility] for help with management of chronic conditions. Review of the goals of the program.           SDOH assessments and interventions completed:  No     Care Coordination Interventions:  Yes, provided   Interventions Today    Flowsheet Row Most Recent Value  Chronic Disease   Chronic disease during today's visit Diabetes  General Interventions   General Interventions Discussed/Reviewed General Interventions Reviewed, Labs, Doctor Visits  Labs Hgb A1c every 3 months  [need new A1C, last was 8.8 in May]  Doctor Visits Discussed/Reviewed Doctor  Visits Reviewed, PCP, Specialist  [Pharmacist appt today, state he has PCP next month, however not on schedule.  Advised to call to schedule]  PCP/Specialist Visits Compliance with follow-up visit  Education Interventions   Education Provided Provided Education  Provided Verbal Education On Mental Health/Coping with Illness, Blood Sugar Monitoring  [Report blood sugars remain in the 100s]  Mental Health Interventions   Mental Health Discussed/Reviewed Grief and Loss  [Father passed away in the last month.  Offered SW involvement for grief counseling, he declined]       Follow up plan: Follow up call scheduled for 9/5    Encounter Outcome:  Pt. Visit Completed   Kemper Durie, RN, MSN, [facility] Care Management Care Management Coordinator [phone]

## 2022-10-30 NOTE — Progress Notes (Signed)
   Outreach Note  10/30/2022 Name: [name] MRN: [id number] DOB: Apr 12, 1965  Referred by: Malva Limes, MD Reason for referral : No chief complaint on file.   Was unable to reach patient via telephone today and have left HIPAA compliant voicemail asking patient to return my call.   Follow Up Plan: Will collaborate with Care Guide to outreach to schedule follow up with me  Estelle Grumbles, PharmD, [facility] Clinical Pharmacist [facility] [phone]

## 2022-11-01 ENCOUNTER — Other Ambulatory Visit: Payer: Self-pay | Admitting: Family Medicine

## 2022-11-01 DIAGNOSIS — G8928 Other chronic postprocedural pain: Secondary | ICD-10-CM

## 2022-11-01 DIAGNOSIS — F1129 Opioid dependence with unspecified opioid-induced disorder: Secondary | ICD-10-CM

## 2022-11-01 NOTE — Telephone Encounter (Signed)
Medication Refill - Medication: oxyCODONE (ROXICODONE) 15 MG immediate release tablet   Has the patient contacted their pharmacy? No.   Preferred Pharmacy (with phone number or street name):  [facility] [name], [place] Phone: [phone]  Fax: [phone]     Has the patient been seen for an appointment in the last year OR does the patient have an upcoming appointment? Yes.    Agent: Please be advised that RX refills may take up to 3 business days. We ask that you follow-up with your pharmacy.

## 2022-11-02 NOTE — Telephone Encounter (Signed)
Requested medication (s) are due for refill today - yes  Requested medication (s) are on the active medication list -yes  Future visit scheduled - no  Last refill: 10/05/22 #180  Notes to clinic: non delegated Rx  Requested Prescriptions  Pending Prescriptions Disp Refills   oxyCODONE (ROXICODONE) 15 MG immediate release tablet 180 tablet 0    Sig: Take 1 tablet (15 mg total) by mouth every 4 (four) hours as needed for pain.     Not Delegated - Analgesics:  Opioid Agonists Failed - 11/01/2022  2:00 PM      Failed - This refill cannot be delegated      Failed - Urine Drug Screen completed in last 360 days      Passed - Valid encounter within last 3 months    Recent Outpatient Visits           1 month ago Hypertension associated with diabetes [facility])   [place] [facility] Merita Norton T, FNP   6 months ago Type 2 diabetes mellitus with other specified complication, without long-term current use of insulin (HCC)   [place] [facility] Malva Limes, MD   7 months ago Epigastric pain   [place] [facility] Malva Limes, MD   8 months ago Type 2 diabetes mellitus with other specified complication, without long-term current use of insulin [facility])   [place] [facility] Malva Limes, MD   11 months ago Stomach irritation   [place] [facility] Malva Limes, MD                 Requested Prescriptions  Pending Prescriptions Disp Refills   oxyCODONE (ROXICODONE) 15 MG immediate release tablet 180 tablet 0    Sig: Take 1 tablet (15 mg total) by mouth every 4 (four) hours as needed for pain.     Not Delegated - Analgesics:  Opioid Agonists Failed - 11/01/2022  2:00 PM      Failed - This refill cannot be delegated      Failed - Urine Drug Screen completed in last 360 days      Passed - Valid encounter within last 3 months    Recent Outpatient Visits           1 month ago  Hypertension associated with diabetes [facility])   [place] [facility] Merita Norton T, FNP   6 months ago Type 2 diabetes mellitus with other specified complication, without long-term current use of insulin [facility])   [place] [facility] Malva Limes, MD   7 months ago Epigastric pain   [place] [facility] Malva Limes, MD   8 months ago Type 2 diabetes mellitus with other specified complication, without long-term current use of insulin [facility])   [place] [facility] Malva Limes, MD   11 months ago Stomach irritation   [facility] Sherrie Mustache, Demetrios Isaacs, MD

## 2022-11-03 MED ORDER — OXYCODONE HCL 15 MG PO TABS
15.0000 mg | ORAL_TABLET | ORAL | 0 refills | Status: DC | PRN
Start: 2022-11-03 — End: 2022-12-05

## 2022-11-06 ENCOUNTER — Other Ambulatory Visit: Payer: Self-pay | Admitting: Family Medicine

## 2022-11-06 DIAGNOSIS — G8929 Other chronic pain: Secondary | ICD-10-CM

## 2022-11-07 NOTE — Telephone Encounter (Signed)
Requested Prescriptions  Pending Prescriptions Disp Refills   diclofenac (VOLTAREN) 75 MG EC tablet [Pharmacy Med Name: DICLOFENAC SOD EC 75 MG TAB] 90 tablet 1    Sig: TAKE 1 TABLET BY MOUTH 2 TIMES A DAY AS NEEDED FOR MODERATE PAIN     Analgesics:  NSAIDS Failed - 11/06/2022 12:52 PM      Failed - Manual Review: Labs are only required if the patient has taken medication for more than 8 weeks.      Passed - Cr in normal range and within 360 days    Creat  Date Value Ref Range Status  01/31/2017 0.93 0.70 - 1.33 mg/dL Final    Comment:    For patients >20 years of age, the reference limit for Creatinine is approximately 13% higher for people identified as African-American. .    Creatinine, Ser  Date Value Ref Range Status  09/06/2022 0.83 0.76 - 1.27 mg/dL Final   Creatinine, POC  Date Value Ref Range Status  07/05/2015 NA mg/dL Final         Passed - HGB in normal range and within 360 days    Hemoglobin  Date Value Ref Range Status  09/06/2022 14.6 13.0 - 17.7 g/dL Final         Passed - PLT in normal range and within 360 days    Platelets  Date Value Ref Range Status  09/06/2022 230 150 - 450 x10E3/uL Final         Passed - HCT in normal range and within 360 days    Hematocrit  Date Value Ref Range Status  09/06/2022 44.8 37.5 - 51.0 % Final         Passed - eGFR is 30 or above and within 360 days    GFR, Est African American  Date Value Ref Range Status  01/31/2017 109 > OR = 60 mL/min/1.64m2 Final   GFR calc Af Amer  Date Value Ref Range Status  05/10/2020 106 >59 mL/min/1.73 Final    Comment:    **In accordance with recommendations from the NKF-ASN Task force,**   [facility] is in the process of updating its eGFR calculation to the   2021 CKD-EPI creatinine equation that estimates kidney function   without a race variable.    GFR, Est Non African American  Date Value Ref Range Status  01/31/2017 94 > OR = 60 mL/min/1.42m2 Final   GFR, Estimated   Date Value Ref Range Status  03/13/2022 >60 >60 mL/min Final    Comment:    (NOTE) Calculated using the CKD-EPI Creatinine Equation (2021)    GFR  Date Value Ref Range Status  01/15/2020 85.87 >60.00 mL/min Final   eGFR  Date Value Ref Range Status  09/06/2022 101 >59 mL/min/1.73 Final         Passed - Patient is not pregnant      Passed - Valid encounter within last 12 months    Recent Outpatient Visits           2 months ago Hypertension associated with diabetes [facility])   [place] [facility] Merita Norton T, FNP   6 months ago Type 2 diabetes mellitus with other specified complication, without long-term current use of insulin [facility])   [place] [facility] Malva Limes, MD   7 months ago Epigastric pain   [place] [facility] Malva Limes, MD   9 months ago Type 2 diabetes mellitus with other specified complication, without  long-term current use of insulin [facility])   [place] [facility] Malva Limes, MD   11 months ago Stomach irritation   [facility] Sherrie Mustache, Demetrios Isaacs, MD

## 2022-12-01 ENCOUNTER — Ambulatory Visit: Payer: BC Managed Care – PPO

## 2022-12-03 ENCOUNTER — Other Ambulatory Visit: Payer: Self-pay | Admitting: Family Medicine

## 2022-12-03 DIAGNOSIS — E1169 Type 2 diabetes mellitus with other specified complication: Secondary | ICD-10-CM

## 2022-12-04 ENCOUNTER — Other Ambulatory Visit: Payer: Self-pay | Admitting: Family Medicine

## 2022-12-04 DIAGNOSIS — K219 Gastro-esophageal reflux disease without esophagitis: Secondary | ICD-10-CM

## 2022-12-04 NOTE — Telephone Encounter (Signed)
Medication Refill - Medication: oxyCODONE (ROXICODONE) 15 MG immediate release tablet  Pt states that he will be out of medication after today.   Has the patient contacted their pharmacy? No.   Preferred Pharmacy (with phone number or street name): [id number] [place], [place] - [address]  Phone: [phone] Fax: [phone]  Has the patient been seen for an appointment in the last year OR does the patient have an upcoming appointment? Yes.    Agent: Please be advised that RX refills may take up to 3 business days. We ask that you follow-up with your pharmacy.

## 2022-12-05 ENCOUNTER — Other Ambulatory Visit: Payer: Self-pay | Admitting: Family Medicine

## 2022-12-05 DIAGNOSIS — G8928 Other chronic postprocedural pain: Secondary | ICD-10-CM

## 2022-12-05 DIAGNOSIS — E1169 Type 2 diabetes mellitus with other specified complication: Secondary | ICD-10-CM

## 2022-12-05 DIAGNOSIS — F1129 Opioid dependence with unspecified opioid-induced disorder: Secondary | ICD-10-CM

## 2022-12-05 DIAGNOSIS — J301 Allergic rhinitis due to pollen: Secondary | ICD-10-CM

## 2022-12-05 DIAGNOSIS — F321 Major depressive disorder, single episode, moderate: Secondary | ICD-10-CM

## 2022-12-05 MED ORDER — GLIPIZIDE ER 10 MG PO TB24
10.0000 mg | ORAL_TABLET | Freq: Two times a day (BID) | ORAL | 0 refills | Status: DC
Start: 2022-12-05 — End: 2023-04-02

## 2022-12-05 MED ORDER — OXYCODONE HCL 15 MG PO TABS
15.0000 mg | ORAL_TABLET | ORAL | 0 refills | Status: DC | PRN
Start: 2022-12-05 — End: 2023-01-02

## 2022-12-05 NOTE — Telephone Encounter (Signed)
Medication Refill - Medication: oxyCODONE (ROXICODONE) 15 MG immediate release tablet   Pt is completely out of his current supply  Has the patient contacted their pharmacy? Yes.   (Agent: If no, request that the patient contact the pharmacy for the refill. If patient does not wish to contact the pharmacy document the reason why and proceed with request.) (Agent: If yes, when and what did the pharmacy advise?)  Preferred Pharmacy (with phone number or street name):  [facility] [id number] [place], [place] - [address] [place] [address]  Phone: [phone] Fax: [phone]   Has the patient been seen for an appointment in the last year OR does the patient have an upcoming appointment? Yes.    Agent: Please be advised that RX refills may take up to 3 business days. We ask that you follow-up with your pharmacy.

## 2022-12-14 ENCOUNTER — Ambulatory Visit: Payer: Self-pay | Admitting: *Deleted

## 2022-12-14 NOTE — Patient Outreach (Signed)
  Care Coordination   Follow Up Visit Note   12/14/2022 Name: KEYMANI BETTEN MRN: [id number] DOB: [birth year]  [name] is a 57 y.o. year old male who sees Fisher, Demetrios Isaacs, MD for primary care. I spoke with  [name] by phone today.  What matters to the patients health and wellness today?  Report he is out of town today, does not have time to talk, request call back.       SDOH assessments and interventions completed:  No     Care Coordination Interventions:  No, not indicated   Follow up plan: Follow up call scheduled for 9/9    Encounter Outcome:  Patient Visit Completed   Kemper Durie, RN, MSN, [facility] Care Management Care Management Coordinator [phone]

## 2022-12-18 ENCOUNTER — Ambulatory Visit: Payer: Self-pay | Admitting: *Deleted

## 2022-12-18 NOTE — Patient Outreach (Signed)
  Care Coordination   Follow Up Visit Note   12/18/2022 Name: GOVANNY ETHERIDGE MRN: [id number] DOB: March 23, 1966  [name] is a 57 y.o. year old male who sees Fisher, Demetrios Isaacs, MD for primary care. I spoke with  [name] by phone today.  What matters to the patients health and wellness today?  Keep and attend PCP follow up    Goals Addressed             This Visit's Progress    RNCM: Effective Management of DM   On track    Care Coordination Interventions: Provided education to patient about basic DM disease process Reviewed medications with patient and discussed importance of medication adherence. Counseled on importance of regular laboratory monitoring as prescribed Discussed plans with patient for ongoing care management follow up and provided patient with direct contact information for care management team Advised patient, providing education and rationale, to check cbg BID and record, calling pcp for findings outside established parameters. The patient is checking his blood sugars bid. The patient states it is consistently decreasing. Review with the patient the need of fasting blood sugars <130 and post <180. Review of patient status, including review of consultants reports, relevant laboratory and other test results, and medications completed The patient is receptive to working with the [facility] for help with management of chronic conditions. Review of the goals of the program.           SDOH assessments and interventions completed:  No     Care Coordination Interventions:  Yes, provided   Interventions Today    Flowsheet Row Most Recent Value  Chronic Disease   Chronic disease during today's visit Diabetes, Other  [HLD]  General Interventions   General Interventions Discussed/Reviewed General Interventions Reviewed, Doctor Visits, Communication with  Doctor Visits Discussed/Reviewed Doctor Visits Reviewed, PCP  [PCP 9/27]  PCP/Specialist Visits  Compliance with follow-up visit  Communication with PCP/Specialists  [Called PCP office to help schedule follow up]  Education Interventions   Education Provided Provided Education  Provided Verbal Education On Medication  Pharmacy Interventions   Pharmacy Dicussed/Reviewed Pharmacy Topics Reviewed, Affording Medications  [Provided pharmacist contact information, advised to call to follow up on medication assistance]       Follow up plan: Follow up call scheduled for 10/14    Encounter Outcome:  Patient Visit Completed   Kemper Durie, RN, MSN, [facility] Care Management Care Management Coordinator [phone]

## 2022-12-21 ENCOUNTER — Other Ambulatory Visit: Payer: BC Managed Care – PPO | Admitting: Pharmacist

## 2022-12-21 NOTE — Progress Notes (Signed)
12/21/2022 Name: Vincent Hall MRN: [id number] DOB: April 25, 1965    Vincent Hall is a 57 y.o. year old male who presented for a telephone visit.   They were referred to the pharmacist by their Case Management Team  for assistance in managing medication access.   Patient reports high expenses for medications. Completed fill history review with dispensing pharmacy: - Repatha 140mg  q14 days: $75 in may 2024 - Glipizide XL 10mg  BID (cancelled?)  09/08/22 filled 5mg  daily with breakfast 12/05/22 pharmacy profiled 10mg  BID lunch and supper -->10mg  has refills - Toujeo 300unit, $225 June, never filled - rosuvastatin, pioglitazone end of August - novolog 02/2022 - levemir 56 units every other day 09/08/22  - mounjaro 7.5mg  daily 09/08/22, 10mg  on 12/16/22 (with 1 more refill)   Subjective:  Care Team: Primary Care Provider: Malva Limes, MD   Medication Access/Adherence  Current Pharmacy:  [facility] [id number] [place], [place] - [address] [place] [address] Phone: [phone] Fax: [phone]  [facility] - [place], [place] - [address] [place] [address] Phone: [phone] Fax: [phone]  [facility] [id number] - [address] [place] - [address] [place] [address] Phone: [phone] Fax: [phone]  [facility] [id number] [place], [place] - [address] [place] [address] Phone: [phone] Fax: [phone]  [facility] - [place], [place] - [address] [place] [address] Phone: [phone] Fax: [phone]  [facility] [id number] - [address] [place], [place] - [address] [place] [address] Phone: [phone] Fax: [phone]   Patient reports affordability concerns with their medications: Yes   Diabetes:  Current medications:  glipizide 10mg  XL BID (prescribed BID but only taking once daily, patient discovered this error during our call), mounjaro 10mg  weekly, pioglitazone 30mg  daily, has been out of insulins   Current glucose readings: 100-200s fasting, over 200 when postprandial  Desires CGM -> will investigate Using traditions meter; testing 1-2 times daily    Objective:  Lab Results  Component Value Date   HGBA1C 8.8 (H) 09/06/2022    Lab Results  Component Value Date   CREATININE 0.83 09/06/2022   BUN 10 09/06/2022   NA 134 09/06/2022   K 4.7 09/06/2022   CL 98 09/06/2022   CO2 21 09/06/2022    Lab Results  Component Value Date   CHOL 176 09/06/2022   HDL 54 09/06/2022   LDLCALC 97 09/06/2022   TRIG 145 09/06/2022   CHOLHDL 3.3 09/06/2022    Medications Reviewed Today     Reviewed by Gabriel Carina, RPH (Pharmacist) on 12/21/22 at 1622  Med List Status: <None>   Medication Order Taking? Sig Documenting Provider Last Dose Status Informant  albuterol (VENTOLIN HFA) 108 (90 Base) MCG/ACT inhaler [id number] Yes Inhale 2 puffs into the lungs every 6 (six) hours as needed for wheezing or shortness of breath. Malva Limes, MD Taking Active Other  Alum & Mag Hydroxide-Simeth (GI COCKTAIL) SUSP suspension [id number]  Take 30 mLs by mouth 2 (two) times daily as needed for indigestion (abd pain). Shake well. Each dose to containe 15mL maalox and 15mL viscous lidocaine  Patient not taking: Reported on 09/11/2022   Malva Limes, MD  Active  aspirin 81 MG tablet [id number]  Take 81 mg by mouth daily. [provider]  Active Other  azelastine (OPTIVAR) 0.05 % ophthalmic solution [id number]  INSTILL ONE DROP INTO THE AFFECTED EYE(S) TWICE DAILY Fisher, Demetrios Isaacs, MD  Active   Blood Glucose Monitoring Suppl (ONE TOUCH ULTRA 2) w/Device KIT [id number]  USE AS DIRECTED Malva Limes, MD  Active Other  citalopram (CELEXA) 40 MG tablet [id number]  TAKE 1 TABLET BY MOUTH DAILY Malva Limes,  MD  Active   cyclobenzaprine (FLEXERIL) 5 MG tablet [id number]  Take 1 tablet (5 mg total) by mouth 3 (three) times daily as needed for muscle spasms. TAKE 1 TABLET BY MOUTH 3 TIMES DAILY AS NEEDED MUSCLE SPASMS Merita Norton T, FNP  Active   diclofenac (VOLTAREN) 75 MG EC tablet [id number]  TAKE 1 TABLET BY MOUTH 2 TIMES A DAY AS NEEDED FOR MODERATE PAIN Malva Limes, MD  Active   Evolocumab (REPATHA) 140 MG/ML SOSY [id number] No Inject 140 mg into the skin every 14 (fourteen) days.  Patient not taking: Reported on 12/21/2022   Jacky Kindle, FNP Not Taking Active   ezetimibe (ZETIA) 10 MG tablet [id number] Yes Take 1 tablet (10 mg total) by mouth daily. Jacky Kindle, FNP Taking Active   glipiZIDE (GLUCOTROL XL) 10 MG 24 hr tablet [id number] Yes Take 1 tablet (10 mg total) by mouth 2 (two) times daily before lunch and supper. Malva Limes, MD Taking Active   glucose blood (CONTOUR TEST) test strip [id number]  Check blood sugars 3 times daily. E11.65 (uncontrolled insulin dependent diabetes) Malva Limes, MD  Active   insulin aspart (NOVOLOG FLEXPEN) 100 UNIT/ML FlexPen [id number] Yes Use before meals up to three times a day. Take 6 units if sugar is over 200. 8 units if over 250 and 10 units if over 300. Malva Limes, MD Taking Active   insulin glargine, 2 Unit Dial, (TOUJEO MAX) 300 UNIT/ML Solostar Pen [id number]  Inject 40 Units into the skin daily. Malva Limes, MD  Active   Insulin Pen Needle 32G X 4 MM MISC [id number]  1 each by Does not apply route 4 (four) times daily -  before meals and at bedtime. Tresa Moore, MD  Active   lisinopril (ZESTRIL) 20 MG tablet [id number]  Take 1 tablet (20 mg total) by mouth daily. Tresa Moore, MD  Expired 02/26/21 2359   naloxone (NARCAN) nasal spray 4 mg/0.1 mL [id number]  Place 1 spray into the nose as needed. Jacky Kindle, FNP  Active   nitroGLYCERIN (NITROSTAT) 0.4 MG SL tablet [id number]  DISSOLVE 1 TAB UNDER TONGUE FOR CHEST  PAIN - IF PAIN REMAINS AFTER 5 MIN, CALL 911 AND REPEAT DOSE. MAX 3 TABS IN 15 MINUTES Fisher, Demetrios Isaacs, MD  Active   oxyCODONE (ROXICODONE) 15 MG immediate release tablet [id number]  Take 1 tablet (15 mg total) by mouth every 4 (four) hours as needed for pain. Malva Limes, MD  Active   pantoprazole (PROTONIX) 40 MG tablet [id number]  TAKE ONE TABLET BY MOUTH DAILY Malva Limes, MD  Active   pioglitazone (ACTOS) 30 MG tablet [id number] Yes TAKE 1 TABLET BY MOUTH DAILY Malva Limes, MD Taking Active   RABEprazole (ACIPHEX) 20 MG tablet [id number]  Take 1 tablet (20 mg total) by mouth daily. Jacky Kindle, FNP  Active   rosuvastatin (CRESTOR) 40 MG tablet [id number] Yes Take 1 tablet (40 mg total) by mouth  daily. Malva Limes, MD Taking Active   sucralfate (CARAFATE) 1 g tablet [id number]  Take 1 tablet (1 g total) by mouth 4 (four) times daily. Merita Norton T, FNP  Expired 10/06/22 2359   tirzepatide ([name]) 10 MG/0.5ML Pen [id number] Yes Inject 10 mg into the skin once a week. Jacky Kindle, FNP Taking Active   traZODone (DESYREL) 100 MG tablet [id number]  Take 2 tablets (200 mg total) by mouth at bedtime. Jacky Kindle, FNP  Active   triamcinolone cream (KENALOG) 0.1 % [id number]  Apply 1 Application topically 2 (two) times daily. Jacky Kindle, FNP  Active   Med List Note Jacky Kindle, [place] 09/06/22 1545): UDS pending 09/06/22              Assessment/Plan:   Diabetes: - Currently uncontrolled, facilitating medication access - Recommend patient utilize copay cards (info below and also was sent to his email) to fill medications - Recommend RX for dexcom G7 sensor, will require prior auth. Will route to PCP for collaboration.  - Recommend to check glucose using traditional glucometer for now    Medication Access: - Facilitated the following cost reduction strategies, below:   - Repatha Copay Card: (as little as $5) RxBin: [id number] RxPCN: [id number] RxGrp: [id number] ID:  [id number]  Nathen May Copay Card: (no more than $35) RxBIN [id number] RxPCN [employer] RxGRP [id number] ID [id number] Issuer [id number]   Follow Up Plan: already scheduled with colleague Gentry Fitz next week  Lynnda Shields, PharmD, BCPS Clinical Pharmacist [facility]

## 2022-12-27 ENCOUNTER — Other Ambulatory Visit: Payer: BC Managed Care – PPO | Admitting: Pharmacist

## 2023-01-01 ENCOUNTER — Telehealth: Payer: Self-pay | Admitting: Family Medicine

## 2023-01-01 ENCOUNTER — Telehealth: Payer: Self-pay | Admitting: Pharmacist

## 2023-01-01 ENCOUNTER — Other Ambulatory Visit: Payer: BC Managed Care – PPO | Admitting: Pharmacist

## 2023-01-01 NOTE — Telephone Encounter (Signed)
Covermymeds is requesting prior authorization Key: [id number] Name: Vincent Hall 2.5mg /0.5ML pen injectors

## 2023-01-01 NOTE — Progress Notes (Signed)
Outreach Note  01/01/2023 Name: Vincent Hall MRN: [id number] DOB: 1965-07-12  Was unable to reach patient via telephone today and have left HIPAA compliant voicemail asking patient to return my call.    Follow Up Plan: Will collaborate with Care Guide to outreach to schedule follow up with me  Estelle Grumbles, PharmD, [facility] [phone]

## 2023-01-02 ENCOUNTER — Other Ambulatory Visit: Payer: Self-pay | Admitting: Family Medicine

## 2023-01-02 DIAGNOSIS — F1129 Opioid dependence with unspecified opioid-induced disorder: Secondary | ICD-10-CM

## 2023-01-02 DIAGNOSIS — G8928 Other chronic postprocedural pain: Secondary | ICD-10-CM

## 2023-01-02 NOTE — Telephone Encounter (Signed)
PA initiated

## 2023-01-02 NOTE — Telephone Encounter (Unsigned)
Copied from CRM [id number]. Topic: General - Other >> Jan 02, 2023  9:26 AM Everette C wrote: Reason for CRM: Medication Refill - Medication: oxyCODONE (ROXICODONE) 15 MG immediate release tablet [[id number]]  Has the patient contacted their pharmacy? Yes.   (Agent: If no, request that the patient contact the pharmacy for the refill. If patient does not wish to contact the pharmacy document the reason why and proceed with request.) (Agent: If yes, when and what did the pharmacy advise?)  Preferred Pharmacy (with phone number or street name): [facility] [id number] [place], [place] - [address] [place] [address] Phone: [phone] Fax: [phone] Hours: Not open 24 hours   Has the patient been seen for an appointment in the last year OR does the patient have an upcoming appointment? Yes.    Agent: Please be advised that RX refills may take up to 3 business days. We ask that you follow-up with your pharmacy.

## 2023-01-03 NOTE — Telephone Encounter (Signed)
Requested medication (s) are due for refill today: Yes  Requested medication (s) are on the active medication list: Yes  Last refill:  12/05/22  Future visit scheduled: Yes  Notes to clinic:  Not delegated.    Requested Prescriptions  Pending Prescriptions Disp Refills   oxyCODONE (ROXICODONE) 15 MG immediate release tablet 180 tablet 0    Sig: Take 1 tablet (15 mg total) by mouth every 4 (four) hours as needed for pain.     Not Delegated - Analgesics:  Opioid Agonists Failed - 01/02/2023 10:29 AM      Failed - This refill cannot be delegated      Failed - Urine Drug Screen completed in last 360 days      Failed - Valid encounter within last 3 months    Recent Outpatient Visits           3 months ago Hypertension associated with diabetes [facility])   [place] [facility] Merita Norton T, FNP   8 months ago Type 2 diabetes mellitus with other specified complication, without long-term current use of insulin [facility])   [facility] Malva Limes, MD   9 months ago Epigastric pain   [place] [facility] Malva Limes, MD   10 months ago Type 2 diabetes mellitus with other specified complication, without long-term current use of insulin [facility])   [facility] Malva Limes, MD   1 year ago Stomach irritation   [facility] Malva Limes, MD       Future Appointments             In 2 days Fisher, Demetrios Isaacs, MD [facility]

## 2023-01-04 MED ORDER — OXYCODONE HCL 15 MG PO TABS: 15.0000 mg | ORAL_TABLET | ORAL | 0 refills | Status: DC | PRN

## 2023-01-04 NOTE — Telephone Encounter (Signed)
Called [facility] pharmacy and was made aware of medication approval

## 2023-01-04 NOTE — Telephone Encounter (Signed)
PA Case ID #: [id number]

## 2023-01-04 NOTE — Telephone Encounter (Signed)
Outcome Approved on September 25 by [facility] 2017 Approved. Please note: The allowed amount is 2 mL (4 pens) per 180 days. This medication is to be increased in strength every 30 days, as tolerated by the member, to a maintenance dose of up to 15mg /0.62mL (4 pens per 28 days). The program limit of 4 pens per 180 days of the 2.5mg /0.12mL strength allows for this dose increase. Authorization Expiration Date: 01/03/2024 Drug [name] 2.5MG /0.5ML pen-injectors

## 2023-01-04 NOTE — Telephone Encounter (Signed)
Per pharmacy no prior authorization is needed. Refill is being requested too soon

## 2023-01-05 ENCOUNTER — Telehealth (INDEPENDENT_AMBULATORY_CARE_PROVIDER_SITE_OTHER): Payer: BC Managed Care – PPO | Admitting: Family Medicine

## 2023-01-05 ENCOUNTER — Telehealth: Payer: Self-pay | Admitting: Family Medicine

## 2023-01-05 DIAGNOSIS — R0609 Other forms of dyspnea: Secondary | ICD-10-CM

## 2023-01-05 DIAGNOSIS — E1165 Type 2 diabetes mellitus with hyperglycemia: Secondary | ICD-10-CM

## 2023-01-05 DIAGNOSIS — E785 Hyperlipidemia, unspecified: Secondary | ICD-10-CM

## 2023-01-05 DIAGNOSIS — I152 Hypertension secondary to endocrine disorders: Secondary | ICD-10-CM | POA: Diagnosis not present

## 2023-01-05 DIAGNOSIS — E1169 Type 2 diabetes mellitus with other specified complication: Secondary | ICD-10-CM | POA: Diagnosis not present

## 2023-01-05 DIAGNOSIS — J449 Chronic obstructive pulmonary disease, unspecified: Secondary | ICD-10-CM

## 2023-01-05 DIAGNOSIS — E1159 Type 2 diabetes mellitus with other circulatory complications: Secondary | ICD-10-CM

## 2023-01-05 DIAGNOSIS — J439 Emphysema, unspecified: Secondary | ICD-10-CM

## 2023-01-05 MED ORDER — LISINOPRIL 20 MG PO TABS
20.0000 mg | ORAL_TABLET | Freq: Every day | ORAL | 0 refills | Status: DC
Start: 2023-01-05 — End: 2023-02-22

## 2023-01-05 MED ORDER — INSULIN GLARGINE (2 UNIT DIAL) 300 UNIT/ML ~~LOC~~ SOPN
40.0000 [IU] | PEN_INJECTOR | Freq: Every day | SUBCUTANEOUS | 1 refills | Status: DC
Start: 2023-01-05 — End: 2023-08-17

## 2023-01-05 MED ORDER — ALBUTEROL SULFATE HFA 108 (90 BASE) MCG/ACT IN AERS: 2.0000 | INHALATION_SPRAY | Freq: Four times a day (QID) | RESPIRATORY_TRACT | 3 refills | Status: AC | PRN

## 2023-01-05 MED ORDER — REPATHA 140 MG/ML ~~LOC~~ SOSY: 140.0000 mg | PREFILLED_SYRINGE | SUBCUTANEOUS | 11 refills | Status: DC

## 2023-01-05 NOTE — Telephone Encounter (Signed)
Patient had virtual visit and needs follow up in-office appointment scheduled in 8 weeks please.

## 2023-01-05 NOTE — Telephone Encounter (Signed)
Appointment made

## 2023-01-08 NOTE — Progress Notes (Signed)
MyChart Video Visit    Virtual Visit via Video Note   This format is felt to be most appropriate for this patient at this time. Physical exam was limited by quality of the video and audio technology used for the visit.   Patient location: HOME Provider location: [facility]  I discussed the limitations of evaluation and management by telemedicine and the availability of in person appointments. The patient expressed understanding and agreed to proceed.  Patient: Vincent Hall   DOB: 28-Dec-1965   57 y.o. Male  MRN: [id number] Visit Date: 01/05/2023  Today's healthcare provider: Mila Merry, MD   Chief Complaint  Patient presents with   Medical Management of Chronic Issues    Would like to dicuss Repatha, Toujeo, and insulin   Medication Refill    Albuterol    Subjective    Discussed the use of AI scribe software for clinical note transcription with the patient, who gave verbal consent to proceed.  History of Present Illness   The patient, with a history of diabetes and hyperlipidemia, reports a recent change in his medication regimen due to financial constraints. He has been unable to afford his prescribed Levemir, an insulin analog, and has been advised to switch to [facility], another long-acting insulin, due to its lower cost. He also reports a recommendation to start Repatha, a PCSK9 inhibitor, for his hyperlipidemia, again due to a more affordable cost with a copay card. He had visit with pharmacist earlier this month who is setting him up with copay cards.   The patient also mentions a desire to protect his kidneys, as his father, who recently passed away, had been on losartan for kidney protection for many years. He is currently on lisinopril, which he was not aware also provides renal protection. He requests a refill of his lisinopril prescription.  In addition, the patient requests a refill of his albuterol inhaler, indicating a possible history of reactive airway disease.        Medications: Outpatient Medications Prior to Visit  Medication Sig   Alum & Mag Hydroxide-Simeth (GI COCKTAIL) SUSP suspension Take 30 mLs by mouth 2 (two) times daily as needed for indigestion (abd pain). Shake well. Each dose to containe 15mL maalox and 15mL viscous lidocaine   aspirin 81 MG tablet Take 81 mg by mouth daily.   azelastine (OPTIVAR) 0.05 % ophthalmic solution INSTILL ONE DROP INTO THE AFFECTED EYE(S) TWICE DAILY   Blood Glucose Monitoring Suppl (ONE TOUCH ULTRA 2) w/Device KIT USE AS DIRECTED   citalopram (CELEXA) 40 MG tablet TAKE 1 TABLET BY MOUTH DAILY   cyclobenzaprine (FLEXERIL) 5 MG tablet Take 1 tablet (5 mg total) by mouth 3 (three) times daily as needed for muscle spasms. TAKE 1 TABLET BY MOUTH 3 TIMES DAILY AS NEEDED MUSCLE SPASMS   diclofenac (VOLTAREN) 75 MG EC tablet TAKE 1 TABLET BY MOUTH 2 TIMES A DAY AS NEEDED FOR MODERATE PAIN   ezetimibe (ZETIA) 10 MG tablet Take 1 tablet (10 mg total) by mouth daily.   glipiZIDE (GLUCOTROL XL) 10 MG 24 hr tablet Take 1 tablet (10 mg total) by mouth 2 (two) times daily before lunch and supper.   glucose blood (CONTOUR TEST) test strip Check blood sugars 3 times daily. E11.65 (uncontrolled insulin dependent diabetes)   insulin aspart (NOVOLOG FLEXPEN) 100 UNIT/ML FlexPen Use before meals up to three times a day. Take 6 units if sugar is over 200. 8 units if over 250 and 10 units if over  300.   Insulin Pen Needle 32G X 4 MM MISC 1 each by Does not apply route 4 (four) times daily -  before meals and at bedtime.   naloxone (NARCAN) nasal spray 4 mg/0.1 mL Place 1 spray into the nose as needed.   nitroGLYCERIN (NITROSTAT) 0.4 MG SL tablet DISSOLVE 1 TAB UNDER TONGUE FOR CHEST PAIN - IF PAIN REMAINS AFTER 5 MIN, CALL 911 AND REPEAT DOSE. MAX 3 TABS IN 15 MINUTES   oxyCODONE (ROXICODONE) 15 MG immediate release tablet Take 1 tablet (15 mg total) by mouth every 4 (four) hours as needed for pain.   pantoprazole (PROTONIX) 40 MG  tablet TAKE ONE TABLET BY MOUTH DAILY   pioglitazone (ACTOS) 30 MG tablet TAKE 1 TABLET BY MOUTH DAILY   RABEprazole (ACIPHEX) 20 MG tablet Take 1 tablet (20 mg total) by mouth daily.   rosuvastatin (CRESTOR) 40 MG tablet Take 1 tablet (40 mg total) by mouth daily.   sucralfate (CARAFATE) 1 g tablet Take 1 tablet (1 g total) by mouth 4 (four) times daily.   tirzepatide ([name]) 10 MG/0.5ML Pen Inject 10 mg into the skin once a week.   traZODone (DESYREL) 100 MG tablet Take 2 tablets (200 mg total) by mouth at bedtime.   triamcinolone cream (KENALOG) 0.1 % Apply 1 Application topically 2 (two) times daily.   [DISCONTINUED] albuterol (VENTOLIN HFA) 108 (90 Base) MCG/ACT inhaler Inhale 2 puffs into the lungs every 6 (six) hours as needed for wheezing or shortness of breath.   [DISCONTINUED] Evolocumab (REPATHA) 140 MG/ML SOSY Inject 140 mg into the skin every 14 (fourteen) days.   [DISCONTINUED] insulin glargine, 2 Unit Dial, (TOUJEO MAX) 300 UNIT/ML Solostar Pen Inject 40 Units into the skin daily.   [DISCONTINUED] lisinopril (ZESTRIL) 20 MG tablet Take 1 tablet (20 mg total) by mouth daily. (Patient taking differently: Take 20 mg by mouth as needed.)   No facility-administered medications prior to visit.    Review of Systems  Constitutional:  Negative for appetite change, chills and fever.  Respiratory:  Positive for shortness of breath. Negative for chest tightness and wheezing.   Cardiovascular:  Negative for chest pain and palpitations.  Gastrointestinal:  Negative for abdominal pain, nausea and vomiting.     Objective    There were no vitals taken for this visit.   Physical Exam  Awake, alert, oriented x 3. In no apparent distress       Assessment & Plan        Diabetes Patient has been unable to afford Levemir. Discussed switching to Toujeo due to lower cost. -Send prescription for [employer] to [facility].  Hyperlipidemia Patient has been unable to afford prescribed  medication. Discussed switching to Repatha due to lower cost and potential copay card. -Send prescription for Repatha to [facility].  Hypertension Patient is currently on Lisinopril, which also provides renal protection. No additional benefit from adding Losartan. -Refill Lisinopril prescription.  COPD Patient's Albuterol inhaler is empty. -Refill Albuterol inhaler prescription.  Chronic Pain Patient has been experiencing delays in receiving Oxycodone prescription. -Note to monitor prescription process for potential issues.  General Health Maintenance / Followup Plans -Schedule follow-up appointment in mid-November to check cholesterol and A1c.    No follow-ups on file.     I discussed the assessment and treatment plan with the patient. The patient was provided an opportunity to ask questions and all were answered. The patient agreed with the plan and demonstrated an understanding of the instructions.  The patient was advised to call back or seek an in-person evaluation if the symptoms worsen or if the condition fails to improve as anticipated.  I provided 12 minutes of non-face-to-face time during this encounter.    Mila Merry, MD [facility] [phone] (phone) [phone] (fax)  [facility]

## 2023-01-09 ENCOUNTER — Other Ambulatory Visit: Payer: BC Managed Care – PPO | Admitting: Pharmacist

## 2023-01-09 DIAGNOSIS — E1165 Type 2 diabetes mellitus with hyperglycemia: Secondary | ICD-10-CM

## 2023-01-09 MED ORDER — FREESTYLE LIBRE 3 PLUS SENSOR MISC
5 refills | Status: DC
Start: 2023-01-09 — End: 2023-03-02

## 2023-01-09 MED ORDER — NOVOLOG FLEXPEN 100 UNIT/ML ~~LOC~~ SOPN: PEN_INJECTOR | SUBCUTANEOUS | 11 refills | Status: AC

## 2023-01-09 MED ORDER — FREESTYLE LIBRE 3 READER DEVI: 0 refills | Status: DC

## 2023-01-09 NOTE — Progress Notes (Signed)
01/09/2023 Name: Vincent Hall MRN: [id number] DOB: Feb 11, 1966  Chief Complaint  Patient presents with   Diabetes   Hyperlipidemia    Vincent Hall is a 57 y.o. year old male who presented for a telephone visit.   They were referred to the pharmacist by their PCP for assistance in managing diabetes and hyperlipidemia.    Subjective:  Care Team: Primary Care Provider: Malva Limes, MD ; Next Scheduled Visit: 03/02/23 Clinical Pharmacist: Marlowe Aschoff, PharmD  Medication Access/Adherence  Current Pharmacy:  [facility] [id number] [place], [place] - [address] [place] [address] Phone: [phone] Fax: [phone]  [facility] - [place], [place] - [address] [place] [address] Phone: [phone] Fax: [phone]  [facility] [id number] - [address] [place] - [address] [place] [address] Phone: [phone] Fax: [phone]  [facility] [id number] - [place], [place] - [address] [place] [address] Phone: [phone] Fax: [phone]  [facility] [place] - [address] [place] [address] Phone: [phone] Fax: [phone]  [facility] [id number] - [address] [place], [place] - [address] [place] [address] Phone: [phone] Fax: [phone]   Patient reports affordability concerns with their medications: Yes  Patient reports access/transportation concerns to their pharmacy: No  Patient reports adherence concerns with their medications:  No     Diabetes:  Current medications: [name] 10mg , Glipizide XL 10mg  twice a day, Actos 30mg , NOT taking the Toujeo or Novolog currently (says Novolog is for PRN use if BG >200) Medications tried in the past: Metformin (GI issues)  Current glucose readings: 144 yesterday  morning prior to breakfast Using One Touch Ultra 2 meter; testing 1 times daily  *Interest in CGM  Patient denies hypoglycemic s/sx including dizziness, shakiness, sweating. Patient denies hyperglycemic symptoms including polyuria, polydipsia, polyphagia, nocturia, neuropathy, blurred vision.  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; gets paid once a month  Hyperlipidemia/ASCVD Risk Reduction  Current lipid lowering medications: Rosuvastatin 40mg , Zetia 10mg , Repatha 140mg  (re-starting) Medications tried in the past: N/A  Antiplatelet regimen: Aspirin 81mg   ASCVD History: CABG in 2019 Family History: Mother and father Risk Factors: T2DM  The 10-year ASCVD risk score (Arnett DK, et al., 2019) is: 14.6%   Values used to calculate the score:     Age: 72 years     Sex: Male     Is Non-Hispanic African American: No     Diabetic: Yes     Tobacco smoker: No     Systolic Blood Pressure: 136 mmHg     Is BP treated: Yes     HDL Cholesterol: 54 mg/dL     Total Cholesterol: 176 mg/dL   **Aware he needs to pick-up Lisinopril and restart the medication  Objective:  Lab Results  Component Value Date   HGBA1C 8.8 (H) 09/06/2022    Lab Results  Component Value Date   CREATININE 0.83 09/06/2022   BUN 10 09/06/2022   NA 134 09/06/2022   K 4.7 09/06/2022   CL 98 09/06/2022   CO2 21 09/06/2022    Lab Results  Component Value Date   CHOL 176 09/06/2022   HDL 54 09/06/2022   LDLCALC 97 09/06/2022   TRIG  145 09/06/2022   CHOLHDL 3.3 09/06/2022    Medications Reviewed Today     Reviewed by Pollie Friar, RPH (Pharmacist) on 01/09/23 at 1137  Med List Status: <None>   Medication Order Taking? Sig Documenting Provider Last Dose Status Informant  albuterol (VENTOLIN HFA) 108 (90 Base) MCG/ACT inhaler [id number] Yes Inhale 2 puffs into the lungs every 6 (six) hours as needed for wheezing or shortness of breath. Malva Limes, MD Taking Active   Alum & Mag  Hydroxide-Simeth (GI COCKTAIL) SUSP suspension [id number] Yes Take 30 mLs by mouth 2 (two) times daily as needed for indigestion (abd pain). Shake well. Each dose to containe 15mL maalox and 15mL viscous lidocaine Malva Limes, MD Taking Active   aspirin 81 MG tablet [id number] Yes Take 81 mg by mouth daily. [provider] Taking Active Other  azelastine (OPTIVAR) 0.05 % ophthalmic solution [id number] Yes INSTILL ONE DROP INTO THE AFFECTED EYE(S) TWICE DAILY Fisher, Demetrios Isaacs, MD Taking Active   Blood Glucose Monitoring Suppl (ONE TOUCH ULTRA 2) w/Device KIT [id number] Yes USE AS DIRECTED Malva Limes, MD Taking Active Other  citalopram (CELEXA) 40 MG tablet [id number] Yes TAKE 1 TABLET BY MOUTH DAILY Malva Limes, MD Taking Active   cyclobenzaprine (FLEXERIL) 5 MG tablet [id number] Yes Take 1 tablet (5 mg total) by mouth 3 (three) times daily as needed for muscle spasms. TAKE 1 TABLET BY MOUTH 3 TIMES DAILY AS NEEDED MUSCLE SPASMS Jacky Kindle, FNP Taking Active   diclofenac (VOLTAREN) 75 MG EC tablet [id number] Yes TAKE 1 TABLET BY MOUTH 2 TIMES A DAY AS NEEDED FOR MODERATE PAIN Malva Limes, MD Taking Active   Evolocumab (REPATHA) 140 MG/ML SOSY [id number] Yes Inject 140 mg into the skin every 14 (fourteen) days. Malva Limes, MD Taking Active   ezetimibe (ZETIA) 10 MG tablet [id number] Yes Take 1 tablet (10 mg total) by mouth daily. Jacky Kindle, FNP Taking Active   glipiZIDE (GLUCOTROL XL) 10 MG 24 hr tablet [id number] Yes Take 1 tablet (10 mg total) by mouth 2 (two) times daily before lunch and supper. Malva Limes, MD Taking Active   glucose blood (CONTOUR TEST) test strip [id number] Yes Check blood sugars 3 times daily. E11.65 (uncontrolled insulin dependent diabetes) Malva Limes, MD Taking Active   insulin aspart (NOVOLOG FLEXPEN) 100 UNIT/ML FlexPen [id number] No Use before meals up to three times a day. Take 6 units if sugar is over 200. 8 units if over 250 and 10  units if over 300.  Patient not taking: Reported on 01/09/2023   Malva Limes, MD Not Taking Active   insulin glargine, 2 Unit Dial, (TOUJEO MAX) 300 UNIT/ML Solostar Pen [id number] Yes Inject 40 Units into the skin daily. Malva Limes, MD Taking Active   Insulin Pen Needle 32G X 4 MM MISC [id number] Yes 1 each by Does not apply route 4 (four) times daily -  before meals and at bedtime. Tresa Moore, MD Taking Active   lisinopril (ZESTRIL) 20 MG tablet [id number] Yes Take 1 tablet (20 mg total) by mouth daily. Malva Limes, MD Taking Active   naloxone [facility]) nasal spray 4 mg/0.1 mL [id number] Yes Place 1 spray into the nose as needed. Jacky Kindle, FNP Taking Active   nitroGLYCERIN (NITROSTAT) 0.4 MG SL tablet [id number] Yes DISSOLVE 1 TAB UNDER TONGUE FOR CHEST PAIN - IF PAIN REMAINS AFTER 5 MIN, CALL 911 AND REPEAT DOSE. MAX 3 TABS  IN 15 MINUTES Fisher, Demetrios Isaacs, MD Taking Active   oxyCODONE (ROXICODONE) 15 MG immediate release tablet [id number] Yes Take 1 tablet (15 mg total) by mouth every 4 (four) hours as needed for pain. Malva Limes, MD Taking Active   pantoprazole (PROTONIX) 40 MG tablet [id number] No TAKE ONE TABLET BY MOUTH DAILY  Patient not taking: Reported on 01/09/2023   Malva Limes, MD Not Taking Active   pioglitazone (ACTOS) 30 MG tablet [id number] Yes TAKE 1 TABLET BY MOUTH DAILY Malva Limes, MD Taking Active   RABEprazole (ACIPHEX) 20 MG tablet [id number] Yes Take 1 tablet (20 mg total) by mouth daily. Jacky Kindle, FNP Taking Active   rosuvastatin (CRESTOR) 40 MG tablet [id number] Yes Take 1 tablet (40 mg total) by mouth daily. Malva Limes, MD Taking Active   sucralfate (CARAFATE) 1 g tablet [id number]  Take 1 tablet (1 g total) by mouth 4 (four) times daily. Merita Norton T, FNP  Expired 01/05/23 2359   tirzepatide ([name]) 10 MG/0.5ML Pen [id number] Yes Inject 10 mg into the skin once a week. Jacky Kindle, FNP Taking Active   traZODone (DESYREL)  100 MG tablet [id number] Yes Take 2 tablets (200 mg total) by mouth at bedtime. Jacky Kindle, FNP Taking Active   triamcinolone cream (KENALOG) 0.1 % [id number] Yes Apply 1 Application topically 2 (two) times daily. Jacky Kindle, FNP Taking Active   Med List Note Jacky Kindle, [place] 09/06/22 1545): UDS pending 09/06/22              Assessment/Plan:   Diabetes: - Currently uncontrolled - Reviewed long term cardiovascular and renal outcomes of uncontrolled blood sugar - Reviewed goal A1c, goal fasting, and goal 2 hour post prandial glucose - Recommend to have more adherence to insulin recommended  - Recommend to check glucose daily   Hyperlipidemia/ASCVD Risk Reduction: - Currently controlled.  - Reviewed long term complications of uncontrolled cholesterol - Recommend to restart the Repatha    **Follow Up Plan:** - Need adherence to Toujeo first- 40 units daily is listed but may be too much with current baseline BG levels - Submit script for Freestyle Libre to the pharmacy - Filled Repatha with copay card to $5 per month - Submit Novolog Rx to the pharmacy for when BG >200 (copay card attached below) - For future, consider increasing [name] dose  Novolog Coupon:   Humalog Coupon:   Marlowe Aschoff, PharmD [facility] Phone Number: [phone]

## 2023-01-16 ENCOUNTER — Telehealth: Payer: Self-pay | Admitting: Pharmacist

## 2023-01-16 NOTE — Telephone Encounter (Signed)
See PA note sent to PA team.

## 2023-01-17 NOTE — Telephone Encounter (Signed)
Reference number:[id number]  Descom G6 receiver Device approved From 01/16/2023-01/16/2024

## 2023-01-22 ENCOUNTER — Ambulatory Visit: Payer: Self-pay | Admitting: *Deleted

## 2023-01-22 NOTE — Patient Outreach (Signed)
Care Coordination   Follow Up Visit Note   01/22/2023 Name: Vincent Hall MRN: [id number] DOB: May 17, 1965  Vincent Hall is a 57 y.o. year old male who sees Fisher, Demetrios Isaacs, MD for primary care. I spoke with  Vincent Hall by phone today.  What matters to the patients health and wellness today?  Feels he is doing well working on his DM management. He will continue to monitor his diet and increase exercise.     Goals Addressed             This Visit's Progress    RNCM: Effective Management of DM   On track    Care Coordination Interventions: Provided education to patient about basic DM disease process Reviewed medications with patient and discussed importance of medication adherence. Counseled on importance of regular laboratory monitoring as prescribed Discussed plans with patient for ongoing care management follow up and provided patient with direct contact information for care management team Advised patient, providing education and rationale, to check cbg BID and record, calling pcp for findings outside established parameters. The patient is checking his blood sugars bid. The patient states it is consistently decreasing. Review with the patient the need of fasting blood sugars <130 and post <180. Review of patient status, including review of consultants reports, relevant laboratory and other test results, and medications completed The patient is receptive to working with the [facility] for help with management of chronic conditions. Review of the goals of the program.        COMPLETED: RNCM: Effective Management of Pain   On track    Care Coordination Interventions: Reviewed provider established plan for pain management Discussed importance of adherence to all scheduled medical appointments Counseled on the importance of reporting any/all new or changed pain symptoms or management strategies to pain management provider Advised patient to report to care team affect of  pain on daily activities Discussed use of relaxation techniques and/or diversional activities to assist with pain reduction (distraction, imagery, relaxation, massage, acupressure, TENS, heat, and cold application Reviewed with patient prescribed pharmacological and nonpharmacological pain relief strategies Advised patient to discuss unresolved pain or changes in level or intensity of pain with provider          SDOH assessments and interventions completed:  No{THN Tip this will not be part of the note when signed-REQUIRED REPORT FIELD DO NOT DELETE (Optional):27901}     Care Coordination Interventions:  Yes, provided {THN Tip this will not be part of the note when signed-REQUIRED REPORT FIELD DO NOT DELETE (Optional):27901}  Interventions Today    Flowsheet Row Most Recent Value  Chronic Disease   Chronic disease during today's visit Diabetes  General Interventions   General Interventions Discussed/Reviewed General Interventions Reviewed, Labs, Doctor Visits  Labs Hgb A1c every 3 months  [Will have new A1C done in November]  Doctor Visits Discussed/Reviewed PCP, Doctor Visits Reviewed  [PCP on 11/22]  PCP/Specialist Visits Compliance with follow-up visit  Education Interventions   Education Provided Provided Education  Provided Verbal Education On Medication, When to see the doctor, Blood Sugar Monitoring  [Report blood sugars range 130-140, continues to work with pharmacy team for medication assistance]  Nutrition Interventions   Nutrition Discussed/Reviewed Nutrition Reviewed, Adding fruits and vegetables, Decreasing sugar intake       Follow up plan: Follow up call scheduled for 11/25    Encounter Outcome:  Patient Visit Completed {THN Tip this will not be part of the note when signed-REQUIRED REPORT  FIELD DO NOT DELETE (Optional):27901}  Kemper Durie RN, MSN, CCM [facility], [facility] RN Care Coordinator Direct Dial: [phone] /  Main [phone] Fax [phone] Email: [email or web address] Website: [email or web address]

## 2023-01-29 ENCOUNTER — Other Ambulatory Visit: Payer: Self-pay | Admitting: Family Medicine

## 2023-01-29 DIAGNOSIS — G8928 Other chronic postprocedural pain: Secondary | ICD-10-CM

## 2023-01-29 DIAGNOSIS — F1129 Opioid dependence with unspecified opioid-induced disorder: Secondary | ICD-10-CM

## 2023-01-29 NOTE — Telephone Encounter (Signed)
Medication Refill - Medication: oxyCODONE (ROXICODONE) 15 MG immediate release tablet [[id number]]   Has the patient contacted their pharmacy? Yes.   (Agent: If no, request that the patient contact the pharmacy for the refill. If patient does not wish to contact the pharmacy document the reason why and proceed with request.) (Agent: If yes, when and what did the pharmacy advise?)  Preferred Pharmacy (with phone number or street name):  [facility] [id number] [place], [place] - [address] [place], [place] [address] Phone: [phone]  Fax: [phone]   Has the patient been seen for an appointment in the last year OR does the patient have an upcoming appointment? Yes.    Agent: Please be advised that RX refills may take up to 3 business days. We ask that you follow-up with your pharmacy.

## 2023-01-30 ENCOUNTER — Other Ambulatory Visit: Payer: Self-pay | Admitting: Pharmacist

## 2023-01-30 NOTE — Telephone Encounter (Signed)
Requested medications are due for refill today.  yes  Requested medications are on the active medications list.  yes  Last refill. 01/04/2023 #180 0 rf  Future visit scheduled.   yes  Notes to clinic.  Refill not delegated.    Requested Prescriptions  Pending Prescriptions Disp Refills   oxyCODONE (ROXICODONE) 15 MG immediate release tablet 180 tablet 0    Sig: Take 1 tablet (15 mg total) by mouth every 4 (four) hours as needed for pain.     Not Delegated - Analgesics:  Opioid Agonists Failed - 01/29/2023  4:56 PM      Failed - This refill cannot be delegated      Failed - Urine Drug Screen completed in last 360 days      Passed - Valid encounter within last 3 months    Recent Outpatient Visits           3 weeks ago Hypertension associated with diabetes [facility])   [facility] Malva Limes, MD   4 months ago Hypertension associated with diabetes [facility])   [place] [facility] Merita Norton T, FNP   9 months ago Type 2 diabetes mellitus with other specified complication, without long-term current use of insulin [facility])   [place] [facility] Malva Limes, MD   10 months ago Epigastric pain   [place] [facility] Malva Limes, MD   11 months ago Type 2 diabetes mellitus with other specified complication, without long-term current use of insulin (HCC)   [place] [facility] Malva Limes, MD       Future Appointments             In 1 month Fisher, Demetrios Isaacs, MD [facility]

## 2023-01-30 NOTE — Progress Notes (Signed)
01/30/2023 Name: Vincent Hall MRN: [id number] DOB: 08/11/1965  Chief Complaint  Patient presents with   Diabetes    Vincent Hall is a 57 y.o. year old male who presented for a telephone visit.   They were referred to the pharmacist by their PCP for assistance in managing diabetes and hyperlipidemia.    Subjective:  Care Team: Primary Care Provider: Malva Limes, MD ; Next Scheduled Visit: 03/02/23 Clinical Pharmacist: Marlowe Aschoff, PharmD  Medication Access/Adherence  Current Pharmacy:  [facility] [id number] [place] Rough, [place] - [address] [place] [address] Phone: [phone] Fax: [phone]  [facility] [id number] - [address] [place] - [address] [place] [address] Phone: [phone] Fax: [phone]  [facility] [id number] - [address] [place], [place] - [address] [place] [address] Phone: [phone] Fax: [phone]  [facility] [id number], [place] - [address] [place] [address] Phone: [phone] Fax: [phone]  [facility] - [place], [place] - [address] Renee Harder [place] [address] Phone: [phone] Fax: [phone]   Patient reports affordability concerns with their medications: Yes  Patient reports access/transportation concerns to their pharmacy: No  Patient reports adherence concerns with their medications:  No     Diabetes:  Current medications: [name] 10mg , Glipizide XL 10mg  twice a day, Actos 30mg , Toujeo 40 U in the afternoon (says Novolog is for PRN use if BG >200) Medications tried in the past: Metformin (GI issues)  Current glucose readings: 132 while on the phone today after just waking up Using One Touch Ultra 2 meter; testing 1 times daily  *Interest in CGM  Patient denies hypoglycemic s/sx including  dizziness, shakiness, sweating. Patient denies hyperglycemic symptoms including polyuria, polydipsia, polyphagia, nocturia, neuropathy, blurred vision.  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; gets paid once a month  Hyperlipidemia/ASCVD Risk Reduction  Current lipid lowering medications: Rosuvastatin 40mg , Zetia 10mg , Repatha 140mg  Medications tried in the past: N/A  Antiplatelet regimen: Aspirin 81mg   ASCVD History: CABG in 2019 Family History: Mother and father Risk Factors: T2DM  The 10-year ASCVD risk score (Arnett DK, et al., 2019) is: 14.6%   Values used to calculate the score:     Age: 26 years     Sex: Male     Is Non-Hispanic African American: No     Diabetic: Yes     Tobacco smoker: No     Systolic Blood Pressure: 136 mmHg     Is BP treated: Yes     HDL Cholesterol: 54 mg/dL     Total Cholesterol: 176 mg/dL    Objective:  Lab Results  Component Value Date   HGBA1C 8.8 (H) 09/06/2022    Lab Results  Component Value Date   CREATININE 0.83 09/06/2022   BUN 10 09/06/2022   NA 134 09/06/2022   K 4.7 09/06/2022   CL 98 09/06/2022   CO2 21 09/06/2022    Lab Results  Component Value Date   CHOL 176 09/06/2022   HDL 54 09/06/2022   LDLCALC 97 09/06/2022   TRIG 145 09/06/2022   CHOLHDL 3.3 09/06/2022    Medications Reviewed Today     Reviewed by Pollie Friar, RPH (Pharmacist) on 01/30/23 at 1136  Med List Status: <None>  Medication Order Taking? Sig Documenting Provider Last Dose Status Informant  albuterol (VENTOLIN HFA) 108 (90 Base) MCG/ACT inhaler [id number] Yes Inhale 2 puffs into the lungs every 6 (six) hours as needed for wheezing or shortness of breath. Malva Limes, MD Taking Active   Alum & Mag Hydroxide-Simeth (GI COCKTAIL) SUSP suspension [id number] Yes Take 30 mLs by mouth 2 (two) times daily as needed for indigestion (abd pain). Shake well. Each dose to containe 15mL maalox and 15mL viscous lidocaine Malva Limes, MD Taking Active   aspirin 81 MG tablet [id number] Yes Take 81 mg by mouth daily. [provider] Taking Active Other  azelastine (OPTIVAR) 0.05 % ophthalmic solution [id number] Yes INSTILL ONE DROP INTO THE AFFECTED EYE(S) TWICE DAILY Fisher, Demetrios Isaacs, MD Taking Active   Blood Glucose Monitoring Suppl (ONE TOUCH ULTRA 2) w/Device KIT [id number] Yes USE AS DIRECTED Malva Limes, MD Taking Active Other  citalopram (CELEXA) 40 MG tablet [id number] Yes TAKE 1 TABLET BY MOUTH DAILY Malva Limes, MD Taking Active   Continuous Glucose Receiver (FREESTYLE LIBRE 3 READER) DEVI [id number] No Use as directed to check BG readings. Dx E 11.65  Patient not taking: Reported on 01/30/2023   Malva Limes, MD Not Taking Active   Continuous Glucose Sensor (FREESTYLE LIBRE 3 PLUS SENSOR) MISC [id number] No Change sensor every 15 days. Dx E11.65  Patient not taking: Reported on 01/30/2023   Malva Limes, MD Not Taking Active   cyclobenzaprine (FLEXERIL) 5 MG tablet [id number] Yes Take 1 tablet (5 mg total) by mouth 3 (three) times daily as needed for muscle spasms. TAKE 1 TABLET BY MOUTH 3 TIMES DAILY AS NEEDED MUSCLE SPASMS Jacky Kindle, FNP Taking Active   diclofenac (VOLTAREN) 75 MG EC tablet [id number] Yes TAKE 1 TABLET BY MOUTH 2 TIMES A DAY AS NEEDED FOR MODERATE PAIN Malva Limes, MD Taking Active   Evolocumab (REPATHA) 140 MG/ML SOSY [id number] Yes Inject 140 mg into the skin every 14 (fourteen) days. Malva Limes, MD Taking Active   ezetimibe (ZETIA) 10 MG tablet [id number] Yes Take 1 tablet (10 mg total) by mouth daily. Jacky Kindle, FNP Taking Active   glipiZIDE (GLUCOTROL XL) 10 MG 24 hr tablet [id number] Yes Take 1 tablet (10 mg total) by mouth 2 (two) times daily before lunch and supper. Malva Limes, MD Taking Active   glucose blood (CONTOUR TEST) test strip [id number] Yes Check blood sugars 3 times daily. E11.65 (uncontrolled insulin dependent diabetes) Malva Limes, MD Taking Active   insulin aspart (NOVOLOG FLEXPEN) 100 UNIT/ML FlexPen [id number] No Use before meals up to three times a day. Take 6 units if sugar is over 200. 8 units if over 250 and 10 units if over 300.  Patient not taking: Reported on 01/30/2023   Malva Limes, MD Not Taking Active   insulin glargine, 2 Unit Dial, (TOUJEO MAX) 300 UNIT/ML Solostar Pen [id number] Yes Inject 40 Units into the skin daily. Malva Limes, MD Taking Active            Med Note Lorenso Courier, Dail Lerew M   Tue Jan 30, 2023 11:35 AM) 40 U in the afternoons  Insulin Pen Needle 32G X 4 MM MISC [id number] Yes 1 each by Does not apply route 4 (four) times daily -  before meals and at bedtime. Tresa Moore, MD Taking Active   lisinopril (ZESTRIL) 20 MG tablet [id number] Yes Take 1 tablet (20 mg total)  by mouth daily. Malva Limes, MD Taking Active   naloxone [facility]) nasal spray 4 mg/0.1 mL [id number] Yes Place 1 spray into the nose as needed. Jacky Kindle, FNP Taking Active   nitroGLYCERIN (NITROSTAT) 0.4 MG SL tablet [id number] Yes DISSOLVE 1 TAB UNDER TONGUE FOR CHEST PAIN - IF PAIN REMAINS AFTER 5 MIN, CALL 911 AND REPEAT DOSE. MAX 3 TABS IN 15 MINUTES Fisher, Demetrios Isaacs, MD Taking Active   oxyCODONE (ROXICODONE) 15 MG immediate release tablet [id number] Yes Take 1 tablet (15 mg total) by mouth every 4 (four) hours as needed for pain. Malva Limes, MD Taking Active   pantoprazole (PROTONIX) 40 MG tablet [id number] No TAKE ONE TABLET BY MOUTH DAILY  Patient not taking: Reported on 01/30/2023   Malva Limes, MD Not Taking Active   pioglitazone (ACTOS) 30 MG tablet [id number] Yes TAKE 1 TABLET BY MOUTH DAILY Malva Limes, MD Taking Active   RABEprazole (ACIPHEX) 20 MG tablet [id number] Yes Take 1 tablet (20 mg total) by mouth daily. Jacky Kindle, FNP Taking Active   rosuvastatin (CRESTOR) 40 MG tablet [id number] Yes Take 1 tablet (40 mg total) by mouth daily. Malva Limes, MD Taking Active    tirzepatide [facility]) 10 MG/0.5ML Pen [id number] Yes Inject 10 mg into the skin once a week. Jacky Kindle, FNP Taking Active   traZODone (DESYREL) 100 MG tablet [id number] Yes Take 2 tablets (200 mg total) by mouth at bedtime. Jacky Kindle, FNP Taking Active   triamcinolone cream (KENALOG) 0.1 % [id number] Yes Apply 1 Application topically 2 (two) times daily. Jacky Kindle, FNP Taking Active   Med List Note Jacky Kindle, [place] 09/06/22 1545): UDS pending 09/06/22              Assessment/Plan:   Diabetes: - Currently uncontrolled - Reviewed long term cardiovascular and renal outcomes of uncontrolled blood sugar - Reviewed goal A1c, goal fasting, and goal 2 hour post prandial glucose - Recommend to have more adherence to insulin recommended  - Recommend to check glucose daily   Hyperlipidemia/ASCVD Risk Reduction: - Currently controlled.  - Reviewed long term complications of uncontrolled cholesterol - Continue medications as prescribed   **Follow Up Plan:** - Follow-up call in 2 months- meeting with PCP in 1 month - Reports adherence to medications once again- need regular follow-ups for adherence review - Toujeo $35 monthly; Repatha $5 monthly; [name] $25 monthly - Called pharmacy to confirm ran through insurance, but [employer] sensors are $75 each month and it is $65 for one-time reader; NOT affordable for the patient - For future, consider increasing [name] dose + Toujeo dose (max of 50 U) after seeing next A1c result   Marlowe Aschoff, PharmD [facility] Phone Number: [phone]

## 2023-02-01 MED ORDER — OXYCODONE HCL 15 MG PO TABS: 15.0000 mg | ORAL_TABLET | ORAL | 0 refills | Status: DC | PRN

## 2023-02-11 DIAGNOSIS — R0789 Other chest pain: Secondary | ICD-10-CM | POA: Diagnosis not present

## 2023-02-11 DIAGNOSIS — Z7984 Long term (current) use of oral hypoglycemic drugs: Secondary | ICD-10-CM | POA: Diagnosis not present

## 2023-02-11 DIAGNOSIS — E1165 Type 2 diabetes mellitus with hyperglycemia: Secondary | ICD-10-CM | POA: Diagnosis not present

## 2023-02-11 DIAGNOSIS — I10 Essential (primary) hypertension: Secondary | ICD-10-CM | POA: Diagnosis not present

## 2023-02-11 DIAGNOSIS — Z951 Presence of aortocoronary bypass graft: Secondary | ICD-10-CM | POA: Diagnosis not present

## 2023-02-11 DIAGNOSIS — R079 Chest pain, unspecified: Secondary | ICD-10-CM | POA: Diagnosis not present

## 2023-02-11 DIAGNOSIS — I2510 Atherosclerotic heart disease of native coronary artery without angina pectoris: Secondary | ICD-10-CM | POA: Diagnosis not present

## 2023-02-11 DIAGNOSIS — F319 Bipolar disorder, unspecified: Secondary | ICD-10-CM | POA: Diagnosis not present

## 2023-02-11 DIAGNOSIS — E785 Hyperlipidemia, unspecified: Secondary | ICD-10-CM | POA: Diagnosis not present

## 2023-02-12 DIAGNOSIS — R079 Chest pain, unspecified: Secondary | ICD-10-CM | POA: Diagnosis not present

## 2023-02-12 DIAGNOSIS — I10 Essential (primary) hypertension: Secondary | ICD-10-CM | POA: Diagnosis not present

## 2023-02-12 DIAGNOSIS — E1165 Type 2 diabetes mellitus with hyperglycemia: Secondary | ICD-10-CM | POA: Diagnosis not present

## 2023-02-12 DIAGNOSIS — E119 Type 2 diabetes mellitus without complications: Secondary | ICD-10-CM | POA: Diagnosis not present

## 2023-02-12 DIAGNOSIS — I2510 Atherosclerotic heart disease of native coronary artery without angina pectoris: Secondary | ICD-10-CM | POA: Diagnosis not present

## 2023-02-12 DIAGNOSIS — E785 Hyperlipidemia, unspecified: Secondary | ICD-10-CM | POA: Diagnosis not present

## 2023-02-20 ENCOUNTER — Other Ambulatory Visit: Payer: Self-pay | Admitting: Family Medicine

## 2023-02-20 NOTE — Telephone Encounter (Signed)
Requested medication (s) are due for refill today -yes  Requested medication (s) are on the active medication list -yes  Future visit scheduled -yes  Last refill: 09/07/22 6ml  Notes to clinic: off protocol- provider review   Requested Prescriptions  Pending Prescriptions Disp Refills   [name] 10 MG/0.5ML Pen [Pharmacy Med Name: [name] 10 MG/0.5 ML PEN] 2 mL     Sig: INJECT 10 MG UNDER THE SKIN ONCE WEEKLY     Off-Protocol Failed - 02/20/2023 10:48 AM      Failed - Medication not assigned to a protocol, review manually.      Passed - Valid encounter within last 12 months    Recent Outpatient Visits           1 month ago Hypertension associated with diabetes [facility])   [place] [facility] Malva Limes, MD   5 months ago Hypertension associated with diabetes [facility])   [place] [facility] Merita Norton T, FNP   10 months ago Type 2 diabetes mellitus with other specified complication, without long-term current use of insulin (HCC)   [place] [facility] Malva Limes, MD   11 months ago Epigastric pain   [place] [facility] Malva Limes, MD   1 year ago Type 2 diabetes mellitus with other specified complication, without long-term current use of insulin (HCC)   [place] [facility] Malva Limes, MD       Future Appointments             In 1 week Fisher, Demetrios Isaacs, MD [facility]               Requested Prescriptions  Pending Prescriptions Disp Refills   [name] 10 MG/0.5ML Pen [Pharmacy Med Name: [name] 10 MG/0.5 ML PEN] 2 mL     Sig: INJECT 10 MG UNDER THE SKIN ONCE WEEKLY     Off-Protocol Failed - 02/20/2023 10:48 AM      Failed - Medication not assigned to a protocol, review manually.      Passed - Valid encounter within last 12 months    Recent Outpatient Visits           1 month ago Hypertension associated with  diabetes [facility])   [place] [facility] Malva Limes, MD   5 months ago Hypertension associated with diabetes [facility])   [place] [facility] Merita Norton T, FNP   10 months ago Type 2 diabetes mellitus with other specified complication, without long-term current use of insulin [facility])   [place] [facility] Malva Limes, MD   11 months ago Epigastric pain   [place] [facility] Malva Limes, MD   1 year ago Type 2 diabetes mellitus with other specified complication, without long-term current use of insulin (HCC)    [facility] Malva Limes, MD       Future Appointments             In 1 week Fisher, Demetrios Isaacs, MD [facility], [facility]

## 2023-02-21 ENCOUNTER — Other Ambulatory Visit: Payer: Self-pay | Admitting: Family Medicine

## 2023-02-21 DIAGNOSIS — E1159 Type 2 diabetes mellitus with other circulatory complications: Secondary | ICD-10-CM

## 2023-02-21 DIAGNOSIS — M5442 Lumbago with sciatica, left side: Secondary | ICD-10-CM

## 2023-02-21 DIAGNOSIS — I152 Hypertension secondary to endocrine disorders: Secondary | ICD-10-CM

## 2023-02-21 DIAGNOSIS — G8929 Other chronic pain: Secondary | ICD-10-CM

## 2023-02-21 DIAGNOSIS — G8928 Other chronic postprocedural pain: Secondary | ICD-10-CM

## 2023-02-21 DIAGNOSIS — F1129 Opioid dependence with unspecified opioid-induced disorder: Secondary | ICD-10-CM

## 2023-02-22 ENCOUNTER — Telehealth: Payer: Self-pay | Admitting: Family Medicine

## 2023-02-22 NOTE — Telephone Encounter (Signed)
Requested medication (s) are due for refill today: yes  Requested medication (s) are on the active medication list: yes  Last refill:  08/17/22 #90  Future visit scheduled: yes  Notes to clinic:  med not delegated to NT to RF   Requested Prescriptions  Pending Prescriptions Disp Refills   cyclobenzaprine (FLEXERIL) 5 MG tablet [Pharmacy Med Name: CYCLOBENZAPRINE 5 MG TABLET] 90 tablet 0    Sig: TAKE 1 TABLET BY MOUTH 3 TIMES A DAY AS NEEDED FOR MUSCLE SPASMS     Not Delegated - Analgesics:  Muscle Relaxants Failed - 02/21/2023  5:49 PM      Failed - This refill cannot be delegated      Passed - Valid encounter within last 6 months    Recent Outpatient Visits           1 month ago Hypertension associated with diabetes [facility])   [place] [facility] Malva Limes, MD   5 months ago Hypertension associated with diabetes [facility])   [place] [facility] Merita Norton T, FNP   10 months ago Type 2 diabetes mellitus with other specified complication, without long-term current use of insulin [facility])   [facility] Malva Limes, MD   11 months ago Epigastric pain   [place] [facility] Malva Limes, MD   1 year ago Type 2 diabetes mellitus with other specified complication, without long-term current use of insulin (HCC)   [place] [facility] Malva Limes, MD       Future Appointments             In 1 week Fisher, Demetrios Isaacs, MD [facility], [facility]

## 2023-02-22 NOTE — Telephone Encounter (Signed)
[facility] is [facility] refills on Citalopram HBR 40 mg.

## 2023-02-22 NOTE — Telephone Encounter (Signed)
Requested medication (s) are due for refill today: yes  Requested medication (s) are on the active medication list: expired  Last refill:  01/05/23  Future visit scheduled: yes  Notes to clinic:  rx ended 02/04/23   Requested Prescriptions  Pending Prescriptions Disp Refills   lisinopril (ZESTRIL) 20 MG tablet [Pharmacy Med Name: LISINOPRIL 20 MG TABLET] 30 tablet     Sig: TAKE 1 TABLET BY MOUTH DAILY     Cardiovascular:  ACE Inhibitors Passed - 02/21/2023  5:50 PM      Passed - Cr in normal range and within 180 days    Creat  Date Value Ref Range Status  01/31/2017 0.93 0.70 - 1.33 mg/dL Final    Comment:    For patients >65 years of age, the reference limit for Creatinine is approximately 13% higher for people identified as African-American. .    Creatinine, Ser  Date Value Ref Range Status  09/06/2022 0.83 0.76 - 1.27 mg/dL Final   Creatinine, POC  Date Value Ref Range Status  07/05/2015 NA mg/dL Final         Passed - K in normal range and within 180 days    Potassium  Date Value Ref Range Status  09/06/2022 4.7 3.5 - 5.2 mmol/L Final         Passed - Patient is not pregnant      Passed - Last BP in normal range    BP Readings from Last 1 Encounters:  09/06/22 136/68         Passed - Valid encounter within last 6 months    Recent Outpatient Visits           1 month ago Hypertension associated with diabetes [facility])   [place] [facility] Malva Limes, MD   5 months ago Hypertension associated with diabetes [facility])   [place] [facility] Merita Norton T, FNP   10 months ago Type 2 diabetes mellitus with other specified complication, without long-term current use of insulin (HCC)   [place] [facility] Malva Limes, MD   11 months ago Epigastric pain   [place] [facility] Malva Limes, MD   1 year ago Type 2 diabetes mellitus with other specified complication, without  long-term current use of insulin (HCC)   [place] [facility] Malva Limes, MD       Future Appointments             In 1 week Fisher, Demetrios Isaacs, MD [facility], [facility]            Signed Prescriptions Disp Refills   rosuvastatin (CRESTOR) 40 MG tablet 90 tablet 1    Sig: TAKE 1 TABLET BY MOUTH DAILY     Cardiovascular:  Antilipid - Statins 2 Failed - 02/21/2023  5:50 PM      Failed - Lipid Panel in normal range within the last 12 months    Cholesterol, Total  Date Value Ref Range Status  09/06/2022 176 100 - 199 mg/dL Final   LDL Cholesterol (Calc)  Date Value Ref Range Status  01/31/2017 124 (H) mg/dL (calc) Final    Comment:    Reference range: <100 . Desirable range <100 mg/dL for primary prevention;   <70 mg/dL for patients with CHD or diabetic patients  with > or = 2 CHD risk factors. Marland Kitchen LDL-C is now calculated using the Martin-Hopkins  calculation, which is a validated  novel method providing  better accuracy than the Friedewald equation in the  estimation of LDL-C.  Horald Pollen et al. Lenox Ahr. 1610;960(45): 2061-2068  (http://education.QuestDiagnostics.com/faq/FAQ164)    LDL Chol Calc (NIH)  Date Value Ref Range Status  09/06/2022 97 0 - 99 mg/dL Final   HDL  Date Value Ref Range Status  09/06/2022 54 >39 mg/dL Final   Triglycerides  Date Value Ref Range Status  09/06/2022 145 0 - 149 mg/dL Final         Passed - Cr in normal range and within 360 days    Creat  Date Value Ref Range Status  01/31/2017 0.93 0.70 - 1.33 mg/dL Final    Comment:    For patients >17 years of age, the reference limit for Creatinine is approximately 13% higher for people identified as African-American. .    Creatinine, Ser  Date Value Ref Range Status  09/06/2022 0.83 0.76 - 1.27 mg/dL Final   Creatinine, POC  Date Value Ref Range Status  07/05/2015 NA mg/dL Final         Passed - Patient is not pregnant      Passed -  Valid encounter within last 12 months    Recent Outpatient Visits           1 month ago Hypertension associated with diabetes [facility])   [place] [facility] Malva Limes, MD   5 months ago Hypertension associated with diabetes [facility])   [place] [facility] Merita Norton T, FNP   10 months ago Type 2 diabetes mellitus with other specified complication, without long-term current use of insulin (HCC)   [facility] Malva Limes, MD   11 months ago Epigastric pain   [place] [facility] Malva Limes, MD   1 year ago Type 2 diabetes mellitus with other specified complication, without long-term current use of insulin (HCC)    [facility] Malva Limes, MD       Future Appointments             In 1 week Fisher, Demetrios Isaacs, MD [facility]             diclofenac (VOLTAREN) 75 MG EC tablet 90 tablet 1    Sig: TAKE 1 TABLET BY MOUTH 2 TIMES A DAY AS NEEDED FOR MODERATE PAIN     Analgesics:  NSAIDS Failed - 02/21/2023  5:50 PM      Failed - Manual Review: Labs are only required if the patient has taken medication for more than 8 weeks.      Passed - Cr in normal range and within 360 days    Creat  Date Value Ref Range Status  01/31/2017 0.93 0.70 - 1.33 mg/dL Final    Comment:    For patients >69 years of age, the reference limit for Creatinine is approximately 13% higher for people identified as African-American. .    Creatinine, Ser  Date Value Ref Range Status  09/06/2022 0.83 0.76 - 1.27 mg/dL Final   Creatinine, POC  Date Value Ref Range Status  07/05/2015 NA mg/dL Final         Passed - HGB in normal range and within 360 days    Hemoglobin  Date Value Ref Range Status  09/06/2022 14.6 13.0 - 17.7 g/dL Final         Passed - PLT in normal range and within 360 days  Platelets  Date Value Ref Range Status  09/06/2022  230 150 - 450 x10E3/uL Final         Passed - HCT in normal range and within 360 days    Hematocrit  Date Value Ref Range Status  09/06/2022 44.8 37.5 - 51.0 % Final         Passed - eGFR is 30 or above and within 360 days    GFR, Est African American  Date Value Ref Range Status  01/31/2017 109 > OR = 60 mL/min/1.51m2 Final   GFR calc Af Amer  Date Value Ref Range Status  05/10/2020 106 >59 mL/min/1.73 Final    Comment:    **In accordance with recommendations from the NKF-ASN Task force,**   [facility] is in the process of updating its eGFR calculation to the   2021 CKD-EPI creatinine equation that estimates kidney function   without a race variable.    GFR, Est Non African American  Date Value Ref Range Status  01/31/2017 94 > OR = 60 mL/min/1.30m2 Final   GFR, Estimated  Date Value Ref Range Status  03/13/2022 >60 >60 mL/min Final    Comment:    (NOTE) Calculated using the CKD-EPI Creatinine Equation (2021)    GFR  Date Value Ref Range Status  01/15/2020 85.87 >60.00 mL/min Final   eGFR  Date Value Ref Range Status  09/06/2022 101 >59 mL/min/1.73 Final         Passed - Patient is not pregnant      Passed - Valid encounter within last 12 months    Recent Outpatient Visits           1 month ago Hypertension associated with diabetes [facility])   [place] [facility] Malva Limes, MD   5 months ago Hypertension associated with diabetes [facility])   [place] T, FNP   10 months ago Type 2 diabetes mellitus with other specified complication, without long-term current use of insulin [facility])   [place] [facility] Malva Limes, MD   11 months ago Epigastric pain   [place] Malva Limes, MD   1 year ago Type 2 diabetes mellitus with other specified complication, without long-term current use of insulin (HCC)   [place] [facility] Malva Limes, MD       Future Appointments             In 1 week Fisher, Demetrios Isaacs, [facility], [facility]

## 2023-02-22 NOTE — Telephone Encounter (Signed)
Requested Prescriptions  Pending Prescriptions Disp Refills   rosuvastatin (CRESTOR) 40 MG tablet [Pharmacy Med Name: ROSUVASTATIN CALCIUM 40 MG TAB] 90 tablet 1    Sig: TAKE 1 TABLET BY MOUTH DAILY     Cardiovascular:  Antilipid - Statins 2 Failed - 02/21/2023  5:50 PM      Failed - Lipid Panel in normal range within the last 12 months    Cholesterol, Total  Date Value Ref Range Status  09/06/2022 176 100 - 199 mg/dL Final   LDL Cholesterol (Calc)  Date Value Ref Range Status  01/31/2017 124 (H) mg/dL (calc) Final    Comment:    Reference range: <100 . Desirable range <100 mg/dL for primary prevention;   <70 mg/dL for patients with CHD or diabetic patients  with > or = 2 CHD risk factors. Marland Kitchen LDL-C is now calculated using the Martin-Hopkins  calculation, which is a validated novel method providing  better accuracy than the Friedewald equation in the  estimation of LDL-C.  Horald Pollen et al. Lenox Ahr. 2841;324(40): 2061-2068  (http://education.[employer].com/faq/FAQ164)    LDL Chol Calc (NIH)  Date Value Ref Range Status  09/06/2022 97 0 - 99 mg/dL Final   HDL  Date Value Ref Range Status  09/06/2022 54 >39 mg/dL Final   Triglycerides  Date Value Ref Range Status  09/06/2022 145 0 - 149 mg/dL Final         Passed - Cr in normal range and within 360 days    Creat  Date Value Ref Range Status  01/31/2017 0.93 0.70 - 1.33 mg/dL Final    Comment:    For patients >20 years of age, the reference limit for Creatinine is approximately 13% higher for people identified as African-American. .    Creatinine, Ser  Date Value Ref Range Status  09/06/2022 0.83 0.76 - 1.27 mg/dL Final   Creatinine, POC  Date Value Ref Range Status  07/05/2015 NA mg/dL Final         Passed - Patient is not pregnant      Passed - Valid encounter within last 12 months    Recent Outpatient Visits           1 month ago Hypertension associated with diabetes [facility])   [place]  [facility] Malva Limes, MD   5 months ago Hypertension associated with diabetes [facility])   [place] [facility] Merita Norton T, FNP   10 months ago Type 2 diabetes mellitus with other specified complication, without long-term current use of insulin (HCC)   [place] [facility] Malva Limes, MD   11 months ago Epigastric pain   [place] [facility] Malva Limes, MD   1 year ago Type 2 diabetes mellitus with other specified complication, without long-term current use of insulin (HCC)    [facility] Malva Limes, MD       Future Appointments             In 1 week Sherrie Mustache, Demetrios Isaacs, MD [facility]             diclofenac (VOLTAREN) 75 MG EC tablet [Pharmacy Med Name: DICLOFENAC SOD EC 75 MG TAB] 90 tablet 1    Sig: TAKE 1 TABLET BY MOUTH 2 TIMES A DAY AS NEEDED FOR MODERATE PAIN     Analgesics:  NSAIDS Failed - 02/21/2023  5:50 PM      Failed - Manual  Review: Labs are only required if the patient has taken medication for more than 8 weeks.      Passed - Cr in normal range and within 360 days    Creat  Date Value Ref Range Status  01/31/2017 0.93 0.70 - 1.33 mg/dL Final    Comment:    For patients >6 years of age, the reference limit for Creatinine is approximately 13% higher for people identified as African-American. .    Creatinine, Ser  Date Value Ref Range Status  09/06/2022 0.83 0.76 - 1.27 mg/dL Final   Creatinine, POC  Date Value Ref Range Status  07/05/2015 NA mg/dL Final         Passed - HGB in normal range and within 360 days    Hemoglobin  Date Value Ref Range Status  09/06/2022 14.6 13.0 - 17.7 g/dL Final         Passed - PLT in normal range and within 360 days    Platelets  Date Value Ref Range Status  09/06/2022 230 150 - 450 x10E3/uL Final         Passed - HCT in normal range and within 360 days     Hematocrit  Date Value Ref Range Status  09/06/2022 44.8 37.5 - 51.0 % Final         Passed - eGFR is 30 or above and within 360 days    GFR, Est African American  Date Value Ref Range Status  01/31/2017 109 > OR = 60 mL/min/1.2m2 Final   GFR calc Af Amer  Date Value Ref Range Status  05/10/2020 106 >59 mL/min/1.73 Final    Comment:    **In accordance with recommendations from the NKF-ASN Task force,**   [facility] is in the process of updating its eGFR calculation to the   2021 CKD-EPI creatinine equation that estimates kidney function   without a race variable.    GFR, Est Non African American  Date Value Ref Range Status  01/31/2017 94 > OR = 60 mL/min/1.64m2 Final   GFR, Estimated  Date Value Ref Range Status  03/13/2022 >60 >60 mL/min Final    Comment:    (NOTE) Calculated using the CKD-EPI Creatinine Equation (2021)    GFR  Date Value Ref Range Status  01/15/2020 85.87 >60.00 mL/min Final   eGFR  Date Value Ref Range Status  09/06/2022 101 >59 mL/min/1.73 Final         Passed - Patient is not pregnant      Passed - Valid encounter within last 12 months    Recent Outpatient Visits           1 month ago Hypertension associated with diabetes [facility])   [place] [facility] Malva Limes, MD   5 months ago Hypertension associated with diabetes [facility])   [place] [facility] Merita Norton T, FNP   10 months ago Type 2 diabetes mellitus with other specified complication, without long-term current use of insulin [facility])   [place] [facility] Malva Limes, MD   11 months ago Epigastric pain   [facility] Malva Limes, MD   1 year ago Type 2 diabetes mellitus with other specified complication, without long-term current use of insulin (HCC)    [facility] Malva Limes, MD       Future Appointments             In 1 week Sherrie Mustache, Demetrios Isaacs, MD  [facility]             lisinopril (ZESTRIL) 20 MG tablet [Pharmacy Med Name: LISINOPRIL 20 MG TABLET] 30 tablet     Sig: TAKE 1 TABLET BY MOUTH DAILY     Cardiovascular:  ACE Inhibitors Passed - 02/21/2023  5:50 PM      Passed - Cr in normal range and within 180 days    Creat  Date Value Ref Range Status  01/31/2017 0.93 0.70 - 1.33 mg/dL Final    Comment:    For patients >20 years of age, the reference limit for Creatinine is approximately 13% higher for people identified as African-American. .    Creatinine, Ser  Date Value Ref Range Status  09/06/2022 0.83 0.76 - 1.27 mg/dL Final   Creatinine, POC  Date Value Ref Range Status  07/05/2015 NA mg/dL Final         Passed - K in normal range and within 180 days    Potassium  Date Value Ref Range Status  09/06/2022 4.7 3.5 - 5.2 mmol/L Final         Passed - Patient is not pregnant      Passed - Last BP in normal range    BP Readings from Last 1 Encounters:  09/06/22 136/68         Passed - Valid encounter within last 6 months    Recent Outpatient Visits           1 month ago Hypertension associated with diabetes [facility])   [place] [facility] Malva Limes, MD   5 months ago Hypertension associated with diabetes [facility])   [place] [facility] Merita Norton T, FNP   10 months ago Type 2 diabetes mellitus with other specified complication, without long-term current use of insulin [facility])   [place] [facility] Malva Limes, MD   11 months ago Epigastric pain   [place] [facility] Malva Limes, MD   1 year ago Type 2 diabetes mellitus with other specified complication, without long-term current use of insulin (HCC)   [place] [facility] Malva Limes, MD       Future Appointments             In 1 week Fisher, Demetrios Isaacs, MD [facility], [facility]

## 2023-02-23 ENCOUNTER — Other Ambulatory Visit: Payer: Self-pay | Admitting: Family Medicine

## 2023-02-23 ENCOUNTER — Other Ambulatory Visit: Payer: Self-pay

## 2023-02-23 DIAGNOSIS — I152 Hypertension secondary to endocrine disorders: Secondary | ICD-10-CM

## 2023-02-23 DIAGNOSIS — F321 Major depressive disorder, single episode, moderate: Secondary | ICD-10-CM

## 2023-02-23 NOTE — Telephone Encounter (Signed)
Rx request created

## 2023-02-26 MED ORDER — CITALOPRAM HYDROBROMIDE 40 MG PO TABS: 40.0000 mg | ORAL_TABLET | Freq: Every day | ORAL | 4 refills | Status: DC

## 2023-03-02 ENCOUNTER — Encounter: Payer: Self-pay | Admitting: Family Medicine

## 2023-03-02 ENCOUNTER — Ambulatory Visit: Payer: BC Managed Care – PPO | Admitting: Family Medicine

## 2023-03-02 VITALS — BP 123/61 | HR 62 | Ht 68.0 in

## 2023-03-02 DIAGNOSIS — F1129 Opioid dependence with unspecified opioid-induced disorder: Secondary | ICD-10-CM | POA: Diagnosis not present

## 2023-03-02 DIAGNOSIS — Z794 Long term (current) use of insulin: Secondary | ICD-10-CM

## 2023-03-02 DIAGNOSIS — Z23 Encounter for immunization: Secondary | ICD-10-CM | POA: Diagnosis not present

## 2023-03-02 DIAGNOSIS — G8928 Other chronic postprocedural pain: Secondary | ICD-10-CM | POA: Diagnosis not present

## 2023-03-02 DIAGNOSIS — E1165 Type 2 diabetes mellitus with hyperglycemia: Secondary | ICD-10-CM

## 2023-03-02 LAB — POCT GLYCOSYLATED HEMOGLOBIN (HGB A1C): Hemoglobin A1C: 6.9 % — AB (ref 4.0–5.6)

## 2023-03-02 MED ORDER — OXYCODONE HCL 15 MG PO TABS: 15.0000 mg | ORAL_TABLET | ORAL | 0 refills | Status: DC | PRN

## 2023-03-02 NOTE — Patient Instructions (Signed)
.   Please review the attached list of medications and notify my office if there are any errors.   . Please bring all of your medications to every appointment so we can make sure that our medication list is the same as yours.   

## 2023-03-02 NOTE — Progress Notes (Unsigned)
Established patient visit   Patient: Vincent Hall   DOB: 16-Jun-1965   57 y.o. Male  MRN: [id number] Visit Date: 03/02/2023  Today's healthcare provider: Mila Merry, MD   Chief Complaint  Patient presents with   Medical Management of Chronic Issues   Diabetes    Aware to update eye exam Foot exam due. Patient aware    Subjective    Discussed the use of AI scribe software for clinical note transcription with the patient, who gave verbal consent to proceed.  History of Present Illness   The patient, with a history of diabetes, has been managing his blood sugar levels at home, reporting that recent readings have consistently been below 200. He has been taking [name] 10mg  and insulin 40 units without any reported side effects or hypoglycemic episodes. His last a1c prior to todays visit was 8.8 in May. The patient has reports no gastrointestinal side effects such as constipation or nausea. His weight has remained stable.   The patient has not had an eye exam in approximately two years and is due for a follow-up. He has received his flu shot for the current season. The patient is planning to travel to [facility] for Thanksgiving.       Medications: Outpatient Medications Prior to Visit  Medication Sig   albuterol (VENTOLIN HFA) 108 (90 Base) MCG/ACT inhaler Inhale 2 puffs into the lungs every 6 (six) hours as needed for wheezing or shortness of breath.   Alum & Mag Hydroxide-Simeth (GI COCKTAIL) SUSP suspension Take 30 mLs by mouth 2 (two) times daily as needed for indigestion (abd pain). Shake well. Each dose to containe 15mL maalox and 15mL viscous lidocaine   aspirin 81 MG tablet Take 81 mg by mouth daily.   azelastine (OPTIVAR) 0.05 % ophthalmic solution INSTILL ONE DROP INTO THE AFFECTED EYE(S) TWICE DAILY   Blood Glucose Monitoring Suppl (ONE TOUCH ULTRA 2) w/Device KIT USE AS DIRECTED   citalopram (CELEXA) 40 MG tablet Take 1 tablet (40 mg total) by mouth daily.    Continuous Glucose Receiver (FREESTYLE LIBRE 3 READER) DEVI Use as directed to check BG readings. Dx E 11.65 (Patient not taking: Reported on 01/30/2023)   Continuous Glucose Sensor (FREESTYLE LIBRE 3 PLUS SENSOR) MISC Change sensor every 15 days. Dx E11.65 (Patient not taking: Reported on 01/30/2023)   cyclobenzaprine (FLEXERIL) 5 MG tablet TAKE 1 TABLET BY MOUTH 3 TIMES A DAY AS NEEDED FOR MUSCLE SPASMS   diclofenac (VOLTAREN) 75 MG EC tablet TAKE 1 TABLET BY MOUTH 2 TIMES A DAY AS NEEDED FOR MODERATE PAIN   Evolocumab (REPATHA) 140 MG/ML SOSY Inject 140 mg into the skin every 14 (fourteen) days.   ezetimibe (ZETIA) 10 MG tablet Take 1 tablet (10 mg total) by mouth daily.   glipiZIDE (GLUCOTROL XL) 10 MG 24 hr tablet Take 1 tablet (10 mg total) by mouth 2 (two) times daily before lunch and supper.   glucose blood (CONTOUR TEST) test strip Check blood sugars 3 times daily. E11.65 (uncontrolled insulin dependent diabetes)   insulin aspart (NOVOLOG FLEXPEN) 100 UNIT/ML FlexPen Use before meals up to three times a day. Take 6 units if sugar is over 200. 8 units if over 250 and 10 units if over 300. (Patient not taking: Reported on 01/30/2023)   insulin glargine, 2 Unit Dial, (TOUJEO MAX) 300 UNIT/ML Solostar Pen Inject 40 Units into the skin daily.   Insulin Pen Needle 32G X 4 MM MISC 1 each by  Does not apply route 4 (four) times daily -  before meals and at bedtime.   lisinopril (ZESTRIL) 20 MG tablet TAKE 1 TABLET BY MOUTH DAILY   naloxone (NARCAN) nasal spray 4 mg/0.1 mL Place 1 spray into the nose as needed.   nitroGLYCERIN (NITROSTAT) 0.4 MG SL tablet DISSOLVE 1 TAB UNDER TONGUE FOR CHEST PAIN - IF PAIN REMAINS AFTER 5 MIN, CALL 911 AND REPEAT DOSE. MAX 3 TABS IN 15 MINUTES   pantoprazole (PROTONIX) 40 MG tablet TAKE ONE TABLET BY MOUTH DAILY (Patient not taking: Reported on 01/30/2023)   pioglitazone (ACTOS) 30 MG tablet TAKE 1 TABLET BY MOUTH DAILY   RABEprazole (ACIPHEX) 20 MG tablet Take 1  tablet (20 mg total) by mouth daily.   rosuvastatin (CRESTOR) 40 MG tablet TAKE 1 TABLET BY MOUTH DAILY   tirzepatide ([name]) 10 MG/0.5ML Pen INJECT 10 MG UNDER THE SKIN ONCE WEEKLY   traZODone (DESYREL) 100 MG tablet Take 2 tablets (200 mg total) by mouth at bedtime.   triamcinolone cream (KENALOG) 0.1 % Apply 1 Application topically 2 (two) times daily.   [DISCONTINUED] oxyCODONE (ROXICODONE) 15 MG immediate release tablet Take 1 tablet (15 mg total) by mouth every 4 (four) hours as needed for pain.   No facility-administered medications prior to visit.    Objective    BP 123/61 (BP Location: Left Arm, Patient Position: Sitting, Cuff Size: Normal)   Pulse 62   Ht 5\' 8"  (1.727 m)   SpO2 99%   BMI 33.15 kg/m {Insert last BP/Wt (optional):23777}{See vitals history (optional):1}  Physical Exam  General appearance: Mildly obese male, cooperative and in no acute distress Head: Normocephalic, without obvious abnormality, atraumatic Respiratory: Respirations even and unlabored, normal respiratory rate Extremities: All extremities are intact.  Skin: Skin color, texture, turgor normal. No rashes seen  Psych: Appropriate mood and affect. Neurologic: Mental status: Alert, oriented to person, place, and time, thought content appropriate.     Results for orders placed or performed in visit on 03/02/23  POCT HgB A1C  Result Value Ref Range   Hemoglobin A1C 6.9 (A) 4.0 - 5.6 %    Assessment & Plan        Type 2 Diabetes Mellitus Good glycemic control with A1c of 6.9. No hypoglycemic episodes reported. Currently on [name] 10mg  and insulin 40 units. No adverse effects reported with [name]. -Increase [name] to 12.5mg  in 2 weeks when his current supply runs out.  -Check A1c in March. Anticipate reducing glipizide if a1c continues to improve.   General Health Maintenance -Recommend eye exam as it has been 2 years since the last one. -Flu vaccination up to date.    Return in about 4  months (around 06/30/2023).      Mila Merry, MD  [facility] [phone] (phone) [phone] (fax)  [facility]

## 2023-03-04 ENCOUNTER — Other Ambulatory Visit: Payer: Self-pay | Admitting: Family Medicine

## 2023-03-04 DIAGNOSIS — E1169 Type 2 diabetes mellitus with other specified complication: Secondary | ICD-10-CM

## 2023-03-05 ENCOUNTER — Ambulatory Visit: Payer: Self-pay | Admitting: *Deleted

## 2023-03-05 NOTE — Patient Outreach (Signed)
  Care Coordination   Follow Up Visit Note   03/05/2023 Name: Vincent Hall MRN: [id number] DOB: 1965-12-04  Vincent Hall is a 57 y.o. year old male who sees Fisher, Demetrios Isaacs, MD for primary care. I spoke with  Vincent Hall by phone today.  What matters to the patients health and wellness today?  Patient report he is doing well, happy to have DM finally managed.  Denies any urgent concerns, encouraged to contact this care manager with questions.      Goals Addressed             This Visit's Progress    RNCM: Effective Management of DM   On track    Care Coordination Interventions: Provided education to patient about basic DM disease process Reviewed medications with patient and discussed importance of medication adherence. Counseled on importance of regular laboratory monitoring as prescribed Discussed plans with patient for ongoing care management follow up and provided patient with direct contact information for care management team Advised patient, providing education and rationale, to check cbg BID and record, calling pcp for findings outside established parameters. The patient is checking his blood sugars bid. The patient states it is consistently decreasing. Review with the patient the need of fasting blood sugars <130 and post <180. Review of patient status, including review of consultants reports, relevant laboratory and other test results, and medications completed The patient is receptive to working with the [facility] for help with management of chronic conditions. Review of the goals of the program.           SDOH assessments and interventions completed:  No     Care Coordination Interventions:  Yes, provided   Interventions Today    Flowsheet Row Most Recent Value  Chronic Disease   Chronic disease during today's visit Diabetes  General Interventions   General Interventions Discussed/Reviewed General Interventions Reviewed, Annual Eye Exam, Annual  Foot Exam, Labs, Doctor Visits  Labs Hgb A1c every 6 months  [Decreased to 6.9]  Doctor Visits Discussed/Reviewed Doctor Visits Reviewed, PCP  [Patient will schedule follow up for 4 months, in March]  PCP/Specialist Visits Compliance with follow-up visit  [Seen by PCP on 11/22]  Education Interventions   Education Provided Provided Education  Provided Verbal Education On Nutrition, Foot Care, Eye Care, Labs, Blood Sugar Monitoring, Medication  [Patient advised of need for eye and foot exams, he will call to schedule. Remains on [name], denies side effects]  Labs Reviewed Hgb A1c  [aware that A1C now at goal.  Was at 12.7 a year ago, now 6.9]  Nutrition Interventions   Nutrition Discussed/Reviewed Nutrition Reviewed, Carbohydrate meal planning, Adding fruits and vegetables, Decreasing sugar intake, Portion sizes       Follow up plan: Follow up call scheduled for 2/17    Encounter Outcome:  Patient Visit Completed   Rodney Langton, RN, MSN, CCM [place]  [facility], [facility] RN Care Coordinator Direct Dial: [phone] / Main [phone] Fax [phone] Email: [email or web address]: [email or web address]

## 2023-03-05 NOTE — Patient Instructions (Signed)
Visit Information  Thank you for taking time to visit with me today. Please don't hesitate to contact me if I can be of assistance to you before our next scheduled telephone appointment.  Following are the goals we discussed today:  Continue current diabetes management, medications and proper diet.  Our next appointment is by telephone on 2/17  Please call the care guide team at [phone] if you need to cancel or reschedule your appointment.   Please call the Suicide and Crisis Lifeline: 988 call the [place] National Suicide Prevention Lifeline: 575 190 3936 or TTY: 301 119 8353 TTY [phone]) to talk to a trained counselor call 1-800-273-TALK (toll free, 24 hour hotline) call 911 if you are experiencing a Mental Health or Behavioral Health Crisis or need someone to talk to.  Patient verbalizes understanding of instructions and care plan provided today and agrees to view in MyChart. Active MyChart status and patient understanding of how to access instructions and care plan via MyChart confirmed with patient.     The patient has been provided with contact information for the care management team and has been advised to call with any health related questions or concerns.   Rodney Langton, RN, MSN, CCM [facility], [facility] RN Care Coordinator Direct Dial: [phone] / Main [phone] Fax [phone] Email: [email or web address] Website: [email or web address]

## 2023-03-09 ENCOUNTER — Other Ambulatory Visit: Payer: Self-pay | Admitting: Family Medicine

## 2023-03-09 MED ORDER — TIRZEPATIDE 12.5 MG/0.5ML ~~LOC~~ SOAJ: 12.5000 mg | SUBCUTANEOUS | 0 refills | Status: DC

## 2023-03-12 ENCOUNTER — Telehealth: Payer: Self-pay | Admitting: Family Medicine

## 2023-03-12 NOTE — Telephone Encounter (Signed)
Received a fax from covermymeds for Pantoprazole Sodium 40mg  Dr tablets  Key:  [id number]

## 2023-03-15 NOTE — Telephone Encounter (Signed)
PA sent to [facility]

## 2023-03-28 ENCOUNTER — Other Ambulatory Visit: Payer: Self-pay | Admitting: Family Medicine

## 2023-03-28 DIAGNOSIS — G8928 Other chronic postprocedural pain: Secondary | ICD-10-CM

## 2023-03-28 DIAGNOSIS — F1129 Opioid dependence with unspecified opioid-induced disorder: Secondary | ICD-10-CM

## 2023-03-28 NOTE — Telephone Encounter (Signed)
Medication Refill -  Most Recent Primary Care Visit:  Provider: Malva Limes  Department: [facility]  Visit Type: OFFICE VISIT  Date: 03/02/2023  Medication: oxyCODONE (ROXICODONE) 15 MG immediate release tablet   Has the patient contacted their pharmacy? No (Agent: If no, request that the patient contact the pharmacy for the refill. If patient does not wish to contact the pharmacy document the reason why and proceed with request.)   Is this the correct pharmacy for this prescription? Yes If no, delete pharmacy and type the correct one.  This is the patient's preferred pharmacy:   [facility] [id number] [place], [place] - [address] [place] [address] Phone: [phone] Fax: [phone]   Has the prescription been filled recently? Yes  Is the patient out of the medication? No  Has the patient been seen for an appointment in the last year OR does the patient have an upcoming appointment? Yes  Can we respond through MyChart? Yes  Agent: Please be advised that Rx refills may take up to 3 business days. We ask that you follow-up with your pharmacy.

## 2023-03-28 NOTE — Telephone Encounter (Signed)
Requested medication (s) are due for refill today - yes  Requested medication (s) are on the active medication list -yes  Future visit scheduled -yes  Last refill: 03/02/23 #180  Notes to clinic: non delegated Rx  Requested Prescriptions  Pending Prescriptions Disp Refills   oxyCODONE (ROXICODONE) 15 MG immediate release tablet 180 tablet 0    Sig: Take 1 tablet (15 mg total) by mouth every 4 (four) hours as needed for pain.     Not Delegated - Analgesics:  Opioid Agonists Failed - 03/28/2023 12:35 PM      Failed - This refill cannot be delegated      Failed - Urine Drug Screen completed in last 360 days      Passed - Valid encounter within last 3 months    Recent Outpatient Visits           3 weeks ago Uncontrolled type 2 diabetes mellitus with hyperglycemia (HCC)   [place] [facility] Malva Limes, MD   2 months ago Hypertension associated with diabetes [facility])   [place] [facility] Malva Limes, MD   6 months ago Hypertension associated with diabetes [facility])   [place] [facility] Merita Norton T, FNP   11 months ago Type 2 diabetes mellitus with other specified complication, without long-term current use of insulin [facility])   [place] [facility] Malva Limes, MD   1 year ago Epigastric pain   [place] [facility] Malva Limes, MD                 Requested Prescriptions  Pending Prescriptions Disp Refills   oxyCODONE (ROXICODONE) 15 MG immediate release tablet 180 tablet 0    Sig: Take 1 tablet (15 mg total) by mouth every 4 (four) hours as needed for pain.     Not Delegated - Analgesics:  Opioid Agonists Failed - 03/28/2023 12:35 PM      Failed - This refill cannot be delegated      Failed - Urine Drug Screen completed in last 360 days      Passed - Valid encounter within last 3 months    Recent Outpatient Visits           3 weeks ago Uncontrolled type 2  diabetes mellitus with hyperglycemia (HCC)   [place] [facility] Malva Limes, MD   2 months ago Hypertension associated with diabetes [facility])   [place] [facility] Malva Limes, MD   6 months ago Hypertension associated with diabetes [facility])   [place] [facility] Merita Norton T, FNP   11 months ago Type 2 diabetes mellitus with other specified complication, without long-term current use of insulin [facility])   [place] [facility] Malva Limes, MD   1 year ago Epigastric pain   [place] [facility] Malva Limes, MD

## 2023-03-30 ENCOUNTER — Other Ambulatory Visit: Payer: Self-pay | Admitting: Pharmacist

## 2023-03-30 ENCOUNTER — Telehealth: Payer: Self-pay | Admitting: Pharmacist

## 2023-03-30 MED ORDER — OXYCODONE HCL 15 MG PO TABS: 15.0000 mg | ORAL_TABLET | ORAL | 0 refills | Status: DC | PRN

## 2023-03-30 NOTE — Progress Notes (Signed)
   03/30/2023  Patient ID: [name], male   DOB: [birth year], 57 y.o.   MRN: [id number]  Called patient for our scheduled phone call visit today. Unable to reach at this time. Left a HIPAA compliant voicemail requesting call back at earliest convenience.  For future need to ask about adherence to: - Repatha 140mg  01/27/23 for 28DS - Lisinopril 20mg  02/22/23 for 30DS   Marlowe Aschoff, PharmD [facility] Phone Number: [phone]

## 2023-03-31 ENCOUNTER — Other Ambulatory Visit: Payer: Self-pay | Admitting: Family Medicine

## 2023-03-31 DIAGNOSIS — E1169 Type 2 diabetes mellitus with other specified complication: Secondary | ICD-10-CM

## 2023-04-09 ENCOUNTER — Other Ambulatory Visit: Payer: Self-pay | Admitting: Pharmacist

## 2023-04-09 NOTE — Progress Notes (Signed)
04/09/2023 Name: Vincent Hall MRN: [id number] DOB: [birth year]  Chief Complaint  Patient presents with   Medication Management    Vincent Hall is a 57 y.o. year old male who presented for a telephone visit.   They were referred to the pharmacist by their PCP for assistance in managing diabetes and hyperlipidemia.    Subjective:  Care Team: Primary Care Provider: Malva Limes, MD ; Next Scheduled Visit: 03/02/23 Clinical Pharmacist: Marlowe Aschoff, PharmD  Medication Access/Adherence  Current Pharmacy:  [facility] [id number] [place], [place] - [address] [place] [address] Phone: [phone] Fax: [phone]  [facility] [id number] - [address] [place] - [address] [place] [address] Phone: [phone] Fax: [phone]  [facility] [id number] - [address] [place], [place] - [address] [place] [address] Phone: [phone] Fax: [phone]  [facility] [id number], [place] - [address] [place] [address] Phone: [phone] Fax: [phone]  [facility] - [place], [place] - [address] [place]e [place]er [place] [address] Phone: [phone] Fax: [phone]   Patient reports affordability concerns with their medications: Yes  Patient reports access/transportation concerns to their pharmacy: No  Patient reports adherence concerns with their medications:  No     Diabetes:  Current medications: [name] 10mg , Glipizide XL 10mg  twice a day, Actos 30mg , Toujeo 40 U in the afternoon (says Novolog is for PRN use if BG >200) Medications tried in the past: Metformin (GI issues)  Current glucose readings: 132 while on the phone today after just waking up Using One Touch Ultra 2 meter; testing 1 times daily  *Interest in CGM  Patient denies hypoglycemic s/sx  including dizziness, shakiness, sweating. Patient denies hyperglycemic symptoms including polyuria, polydipsia, polyphagia, nocturia, neuropathy, blurred vision.  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; gets paid once a month  Hyperlipidemia/ASCVD Risk Reduction  Current lipid lowering medications: Rosuvastatin 40mg , Zetia 10mg , Repatha 140mg  Medications tried in the past: N/A  Antiplatelet regimen: Aspirin 81mg   ASCVD History: CABG in 2019 Family History: Mother and father Risk Factors: T2DM  The 10-year ASCVD risk score (Arnett DK, et al., 2019) is: 13.5%   Values used to calculate the score:     Age: 10 years     Sex: Male     Is Non-Hispanic African American: No     Diabetic: Yes     Tobacco smoker: No     Systolic Blood Pressure: 123 mmHg     Is BP treated: Yes     HDL Cholesterol: 54 mg/dL     Total Cholesterol: 176 mg/dL    Objective:  Lab Results  Component Value Date   HGBA1C 6.9 (A) 03/02/2023    Lab Results  Component Value Date   CREATININE 0.83 09/06/2022   BUN 10 09/06/2022   NA 134 09/06/2022   K 4.7 09/06/2022   CL 98 09/06/2022   CO2 21 09/06/2022    Lab Results  Component Value Date   CHOL 176 09/06/2022   HDL 54 09/06/2022   LDLCALC 97 09/06/2022   TRIG 145 09/06/2022   CHOLHDL 3.3 09/06/2022    Medications Reviewed Today   Medications were not reviewed in this encounter       Assessment/Plan:   Diabetes: -  Currently controlled - Reviewed long term cardiovascular and renal outcomes of uncontrolled blood sugar - Reviewed goal A1c, goal fasting, and goal 2 hour post prandial glucose - Recommend to have more adherence to insulin recommended  - Recommend to check glucose daily   Hyperlipidemia/ASCVD Risk Reduction: - Currently controlled.  - Reviewed long term complications of uncontrolled cholesterol - Continue medications as prescribed   **Follow Up Plan:** - Follow-up call in 2 months  - Reports  adherence to medications once again- need regular follow-ups for adherence review - Toujeo $35 monthly; Repatha $5 monthly; [name] $25 monthly - Called pharmacy to confirm ran through insurance, but [employer] sensors are $75 each month and it is $65 for one-time reader; NOT affordable for the patient - [employer] and refilled Repatha, Lisinopril, and [name] 12.5mg     *Reports being in [place] and will have wife pick-up meds to mail them- advised caution due to refrigerated medications; does NOT plan to return for 1 month   Marlowe Aschoff, PharmD [facility] Phone Number: [phone]

## 2023-04-25 ENCOUNTER — Other Ambulatory Visit: Payer: Self-pay | Admitting: Family Medicine

## 2023-04-25 DIAGNOSIS — G8928 Other chronic postprocedural pain: Secondary | ICD-10-CM

## 2023-04-25 DIAGNOSIS — F1129 Opioid dependence with unspecified opioid-induced disorder: Secondary | ICD-10-CM

## 2023-04-25 NOTE — Telephone Encounter (Signed)
 Medication Refill -  Most Recent Primary Care Visit:  Provider: Lamon Pillow  Department: [facility]  Visit Type: OFFICE VISIT  Date: 03/02/2023  Medication: oxyCODONE  (ROXICODONE ) 15 MG immediate release tablet   Has the patient contacted their pharmacy? No  Is this the correct pharmacy for this prescription? Yes  This is the patient's preferred pharmacy:  [facility] [id number] [place], [place] - [address] [place] [address] Phone: [phone] Fax: [phone]  Has the prescription been filled recently? Yes  Is the patient out of the medication? Yes  Has the patient been seen for an appointment in the last year OR does the patient have an upcoming appointment? Yes  Can we respond through MyChart? No  Agent: Please be advised that Rx refills may take up to 3 business days. We ask that you follow-up with your pharmacy.

## 2023-04-26 MED ORDER — OXYCODONE HCL 15 MG PO TABS: 15.0000 mg | ORAL_TABLET | ORAL | 0 refills | Status: DC | PRN

## 2023-04-26 NOTE — Telephone Encounter (Signed)
Requested medication (s) are due for refill today -yes  Requested medication (s) are on the active medication list -yes  Future visit scheduled -yes  Last refill: 03/30/23 #180  Notes to clinic: non delegated Rx  Requested Prescriptions  Pending Prescriptions Disp Refills   oxyCODONE (ROXICODONE) 15 MG immediate release tablet 180 tablet 0    Sig: Take 1 tablet (15 mg total) by mouth every 4 (four) hours as needed for pain.     Not Delegated - Analgesics:  Opioid Agonists Failed - 04/26/2023  8:15 AM      Failed - This refill cannot be delegated      Failed - Urine Drug Screen completed in last 360 days      Passed - Valid encounter within last 3 months    Recent Outpatient Visits           1 month ago Uncontrolled type 2 diabetes mellitus with hyperglycemia (HCC)   [place] [facility] Malva Limes, MD   3 months ago Hypertension associated with diabetes [facility])   [place] [facility] Malva Limes, MD   7 months ago Hypertension associated with diabetes [facility])   [place] Merita Norton T, FNP   1 year ago Type 2 diabetes mellitus with other specified complication, without long-term current use of insulin [facility])   [place] [facility] Malva Limes, MD   1 year ago Epigastric pain   [place] [facility] Malva Limes, MD                 Requested Prescriptions  Pending Prescriptions Disp Refills   oxyCODONE (ROXICODONE) 15 MG immediate release tablet 180 tablet 0    Sig: Take 1 tablet (15 mg total) by mouth every 4 (four) hours as needed for pain.     Not Delegated - Analgesics:  Opioid Agonists Failed - 04/26/2023  8:15 AM      Failed - This refill cannot be delegated      Failed - Urine Drug Screen completed in last 360 days      Passed - Valid encounter within last 3 months    Recent Outpatient Visits           1 month ago Uncontrolled type 2  diabetes mellitus with hyperglycemia (HCC)   [place] [facility] Malva Limes, MD   3 months ago Hypertension associated with diabetes [facility])   [place] [facility] Malva Limes, MD   7 months ago Hypertension associated with diabetes [facility])   [place] [facility] Merita Norton T, FNP   1 year ago Type 2 diabetes mellitus with other specified complication, without long-term current use of insulin [facility])   [facility] [facility] Malva Limes, MD   1 year ago Epigastric pain    [facility] Malva Limes, MD

## 2023-05-23 ENCOUNTER — Other Ambulatory Visit: Payer: Self-pay | Admitting: Family Medicine

## 2023-05-23 DIAGNOSIS — K2100 Gastro-esophageal reflux disease with esophagitis, without bleeding: Secondary | ICD-10-CM

## 2023-05-23 NOTE — Telephone Encounter (Signed)
Vincent Hall pharmacy is requesting refill RABEprazole (ACIPHEX) 20 MG tablet   Please advise

## 2023-05-23 NOTE — Telephone Encounter (Signed)
Prescription pended. Last filled by Merita Norton on 09/06/22 for 30 days supply. Patient has had a more recent prescription for Pantoprazole filled on 12/05/22, but reported not taking on 01/30/23.   Please review and advise if refill is appropriate.

## 2023-05-24 ENCOUNTER — Other Ambulatory Visit: Payer: Self-pay | Admitting: Family Medicine

## 2023-05-24 ENCOUNTER — Telehealth: Payer: Self-pay | Admitting: Family Medicine

## 2023-05-24 DIAGNOSIS — F5101 Primary insomnia: Secondary | ICD-10-CM

## 2023-05-24 DIAGNOSIS — G8928 Other chronic postprocedural pain: Secondary | ICD-10-CM

## 2023-05-24 DIAGNOSIS — F1129 Opioid dependence with unspecified opioid-induced disorder: Secondary | ICD-10-CM

## 2023-05-24 MED ORDER — RABEPRAZOLE SODIUM 20 MG PO TBEC: 20.0000 mg | DELAYED_RELEASE_TABLET | Freq: Every day | ORAL | 3 refills | Status: AC

## 2023-05-24 NOTE — Telephone Encounter (Signed)
Medication Refill -  Most Recent Primary Care Visit:  Provider: Malva Limes  Department: [facility]  Visit Type: OFFICE VISIT  Date: 03/02/2023  Medication: oxyCODONE (ROXICODONE) 15 MG immediate release tablet   Has the patient contacted their pharmacy? No (Agent: If no, request that the patient contact the pharmacy for the refill. If patient does not wish to contact the pharmacy document the reason why and proceed with request.) (Agent: If yes, when and what did the pharmacy advise?)  Is this the correct pharmacy for this prescription? Yes If no, delete pharmacy and type the correct one.  This is the patient's preferred pharmacy:  [facility] [id number] [place], [place] - [address] [place] [address] Phone: [phone] Fax: [phone]   Has the prescription been filled recently? Yes  Is the patient out of the medication? Yes  Has the patient been seen for an appointment in the last year OR does the patient have an upcoming appointment? Yes  Can we respond through MyChart? Yes  Agent: Please be advised that Rx refills may take up to 3 business days. We ask that you follow-up with your pharmacy.

## 2023-05-24 NOTE — Telephone Encounter (Signed)
[facility] fax[facility] refill request for the following medications:   traZODone (DESYREL) 100 MG tablet      Please advise.

## 2023-05-25 MED ORDER — TRAZODONE HCL 100 MG PO TABS: 200.0000 mg | ORAL_TABLET | Freq: Every day | ORAL | 0 refills | Status: DC

## 2023-05-25 MED ORDER — OXYCODONE HCL 15 MG PO TABS: 15.0000 mg | ORAL_TABLET | ORAL | 0 refills | Status: DC | PRN

## 2023-05-25 NOTE — Telephone Encounter (Signed)
Requested medications are due for refill today.  yes  Requested medications are on the active medications list.  yes  Last refill. 04/26/2023 #180 0 rf  Future visit scheduled.   no  Notes to clinic.  Refill not delegated.    Requested Prescriptions  Pending Prescriptions Disp Refills   oxyCODONE (ROXICODONE) 15 MG immediate release tablet 180 tablet 0    Sig: Take 1 tablet (15 mg total) by mouth every 4 (four) hours as needed for pain.     Not Delegated - Analgesics:  Opioid Agonists Failed - 05/25/2023  8:05 AM      Failed - This refill cannot be delegated      Failed - Urine Drug Screen completed in last 360 days      Passed - Valid encounter within last 3 months    Recent Outpatient Visits           2 months ago Uncontrolled type 2 diabetes mellitus with hyperglycemia (HCC)   [facility] Malva Limes, MD   4 months ago Hypertension associated with diabetes [facility])   [facility] Malva Limes, MD   8 months ago Hypertension associated with diabetes [facility])   [place] [facility] Merita Norton T, FNP   1 year ago Type 2 diabetes mellitus with other specified complication, without long-term current use of insulin [facility])   [place] [facility] Malva Limes, MD   1 year ago Epigastric pain   [place] [facility] Malva Limes, MD

## 2023-05-25 NOTE — Telephone Encounter (Signed)
Refilled

## 2023-05-27 ENCOUNTER — Other Ambulatory Visit: Payer: Self-pay | Admitting: Family Medicine

## 2023-05-28 ENCOUNTER — Telehealth: Payer: Self-pay

## 2023-05-28 ENCOUNTER — Other Ambulatory Visit: Payer: Self-pay

## 2023-05-28 NOTE — Patient Outreach (Signed)
  Care Management   Outreach Note  05/28/2023 Name: Vincent Hall MRN: [id number] DOB: [birth year]  An unsuccessful outreach attempt was made today for a scheduled Care Management visit.   Follow Up Plan:  A HIPAA compliant phone message was left for the patient providing contact information and requesting a return call.      Juanell Fairly [facility] RN Care Manager Direct Dial: [phone]  Fax: [phone] Website: [email or web address]

## 2023-06-08 ENCOUNTER — Other Ambulatory Visit: Payer: Self-pay | Admitting: Pharmacist

## 2023-06-08 NOTE — Progress Notes (Signed)
   06/08/2023  Patient ID: Vincent Hall, male   DOB: 12-May-1965, 58 y.o.   MRN: [id number]  Called patient for a last check-in. Has all of his medications according to fill history- picked up between 2/10 to 2/16. No further concerns at this time and been adherent. Advised this is my last check-in since he has been doing well and no further cost issues to address. Aware to reach out to the office with need for refills in the future.    Marlowe Aschoff, PharmD [facility] Phone Number: [phone]

## 2023-06-21 ENCOUNTER — Telehealth: Payer: Self-pay

## 2023-06-21 NOTE — Telephone Encounter (Signed)
 Copied from CRM [id number]. Topic: General - Other >> Jun 21, 2023 10:45 AM Emylou G wrote: Reason for CRM: pls call patient for earlier appt if possible - he is needing is meds he says experiencing pain [phone]

## 2023-06-25 ENCOUNTER — Telehealth (INDEPENDENT_AMBULATORY_CARE_PROVIDER_SITE_OTHER): Admitting: Family Medicine

## 2023-06-25 ENCOUNTER — Encounter: Payer: Self-pay | Admitting: Family Medicine

## 2023-06-25 DIAGNOSIS — Z794 Long term (current) use of insulin: Secondary | ICD-10-CM | POA: Diagnosis not present

## 2023-06-25 DIAGNOSIS — G8928 Other chronic postprocedural pain: Secondary | ICD-10-CM | POA: Diagnosis not present

## 2023-06-25 DIAGNOSIS — E119 Type 2 diabetes mellitus without complications: Secondary | ICD-10-CM

## 2023-06-25 DIAGNOSIS — F1190 Opioid use, unspecified, uncomplicated: Secondary | ICD-10-CM

## 2023-06-25 MED ORDER — OXYCODONE HCL 15 MG PO TABS: 15.0000 mg | ORAL_TABLET | ORAL | 0 refills | Status: DC | PRN

## 2023-06-25 NOTE — Progress Notes (Signed)
 MyChart Video Visit    Virtual Visit via Video Note   This format is felt to be most appropriate for this patient at this time. Physical exam was limited by quality of the video and audio technology used for the visit.   Patient location: car Provider location: [facility]  I discussed the limitations of evaluation and management by telemedicine and the availability of in person appointments. The patient expressed understanding and agreed to proceed.  Patient: Vincent Hall   DOB: 1965-05-22   58 y.o. Male  MRN: [id number] Visit Date: 06/25/2023  Today's healthcare provider: Mila Merry, MD    Subjective    HPI  Follow up chronic back and chest wall pain. Continues on oxycodone 15 q4 hours which he reports continues to work well and tolerating without side effects. He also continues on [name] and glipizide for diabetes and feels those are working well with fasting sugars typically in the low 100s. A few episodes of hypoglycemic episodes each month which quickly respond to a piece of candy.   Lab Results  Component Value Date   HGBA1C 6.9 (A) 03/02/2023    Medications: Outpatient Medications Prior to Visit  Medication Sig   albuterol (VENTOLIN HFA) 108 (90 Base) MCG/ACT inhaler Inhale 2 puffs into the lungs every 6 (six) hours as needed for wheezing or shortness of breath.   Alum & Mag Hydroxide-Simeth (GI COCKTAIL) SUSP suspension Take 30 mLs by mouth 2 (two) times daily as needed for indigestion (abd pain). Shake well. Each dose to containe 15mL maalox and 15mL viscous lidocaine   aspirin 81 MG tablet Take 81 mg by mouth daily.   azelastine (OPTIVAR) 0.05 % ophthalmic solution INSTILL ONE DROP INTO THE AFFECTED EYE(S) TWICE DAILY   Blood Glucose Monitoring Suppl (ONE TOUCH ULTRA 2) w/Device KIT USE AS DIRECTED   citalopram (CELEXA) 40 MG tablet Take 1 tablet (40 mg total) by mouth daily.   cyclobenzaprine (FLEXERIL) 5 MG tablet TAKE 1 TABLET BY MOUTH 3 TIMES A DAY AS  NEEDED FOR MUSCLE SPASMS   diclofenac (VOLTAREN) 75 MG EC tablet TAKE 1 TABLET BY MOUTH 2 TIMES A DAY AS NEEDED FOR MODERATE PAIN   Evolocumab (REPATHA) 140 MG/ML SOSY Inject 140 mg into the skin every 14 (fourteen) days.   ezetimibe (ZETIA) 10 MG tablet Take 1 tablet (10 mg total) by mouth daily.   glipiZIDE (GLUCOTROL XL) 10 MG 24 hr tablet TAKE 1 TABLET BY MOUTH 2 TIMES A DAY BEFORE LUNCH AND SUPPER.   glucose blood (CONTOUR TEST) test strip Check blood sugars 3 times daily. E11.65 (uncontrolled insulin dependent diabetes)   insulin aspart (NOVOLOG FLEXPEN) 100 UNIT/ML FlexPen Use before meals up to three times a day. Take 6 units if sugar is over 200. 8 units if over 250 and 10 units if over 300. (Patient not taking: Reported on 01/30/2023)   insulin glargine, 2 Unit Dial, (TOUJEO MAX) 300 UNIT/ML Solostar Pen Inject 40 Units into the skin daily.   Insulin Pen Needle 32G X 4 MM MISC 1 each by Does not apply route 4 (four) times daily -  before meals and at bedtime.   lisinopril (ZESTRIL) 20 MG tablet TAKE 1 TABLET BY MOUTH DAILY   naloxone (NARCAN) nasal spray 4 mg/0.1 mL Place 1 spray into the nose as needed.   nitroGLYCERIN (NITROSTAT) 0.4 MG SL tablet DISSOLVE 1 TAB UNDER TONGUE FOR CHEST PAIN - IF PAIN REMAINS AFTER 5 MIN, CALL 911 AND REPEAT DOSE. MAX  3 TABS IN 15 MINUTES   pioglitazone (ACTOS) 30 MG tablet TAKE 1 TABLET BY MOUTH DAILY   RABEprazole (ACIPHEX) 20 MG tablet Take 1 tablet (20 mg total) by mouth daily.   rosuvastatin (CRESTOR) 40 MG tablet TAKE 1 TABLET BY MOUTH DAILY   tirzepatide ([name]) 12.5 MG/0.5ML Pen Inject 12.5 mg into the skin once a week.   traZODone (DESYREL) 100 MG tablet Take 2 tablets (200 mg total) by mouth at bedtime.   triamcinolone cream (KENALOG) 0.1 % Apply 1 Application topically 2 (two) times daily.   [DISCONTINUED] oxyCODONE (ROXICODONE) 15 MG immediate release tablet Take 1 tablet (15 mg total) by mouth every 4 (four) hours as needed for pain.   No  facility-administered medications prior to visit.   Review of Systems     Objective    There were no vitals taken for this visit.   Physical Exam  Awake, alert, oriented x 3. In no apparent distress   Assessment & Plan     1. Other chronic postprocedural pain Doing well current management. refill oxyCODONE (ROXICODONE) 15 MG immediate release tablet; Take 1 tablet (15 mg total) by mouth every 4 (four) hours as needed for pain.  Dispense: 180 tablet; Refill: 0  2. Chronic, continuous use of opioids (Primary) Needs to schedule in office visit.   3. Type 2 diabetes mellitus without complication, with long-term current use of insulin (HCC) Doing well current meds. Need o.v. to check A1c. Anticipate reducing insulin or glipizide if A1c is controlled at follow up  Future Appointments  Date Time Provider Department Center  07/27/2023  3:40 PM Sherrie Mustache, Demetrios Isaacs, MD [facility]        I discussed the assessment and treatment plan with the patient. The patient was provided an opportunity to ask questions and all were answered. The patient agreed with the plan and demonstrated an understanding of the instructions.   The patient was advised to call back or seek an in-person evaluation if the symptoms worsen or if the condition fails to improve as anticipated.  I provided 9 minutes of non-face-to-face time during this encounter.   Mila Merry, MD [facility] [phone] (phone) [phone] (fax)  [facility]

## 2023-07-03 ENCOUNTER — Other Ambulatory Visit: Payer: Self-pay | Admitting: Family Medicine

## 2023-07-03 DIAGNOSIS — G8929 Other chronic pain: Secondary | ICD-10-CM

## 2023-07-03 DIAGNOSIS — F5101 Primary insomnia: Secondary | ICD-10-CM

## 2023-07-03 DIAGNOSIS — E1159 Type 2 diabetes mellitus with other circulatory complications: Secondary | ICD-10-CM

## 2023-07-04 ENCOUNTER — Telehealth: Payer: Self-pay | Admitting: Family Medicine

## 2023-07-04 NOTE — Telephone Encounter (Signed)
 [facility] refill traZODone (DESYREL) 100 MG tablet  &  lisinopril (ZESTRIL) 20 MG tablet  & diclofenac (VOLTAREN) 75 MG EC tablet   Please advise

## 2023-07-05 NOTE — Telephone Encounter (Signed)
 Requested Prescriptions  Pending Prescriptions Disp Refills   lisinopril (ZESTRIL) 20 MG tablet [Pharmacy Med Name: LISINOPRIL 20 MG TABLET] 90 tablet 0    Sig: TAKE 1 TABLET BY MOUTH DAILY     Cardiovascular:  ACE Inhibitors Failed - 07/05/2023 12:12 PM      Failed - Cr in normal range and within 180 days    Creat  Date Value Ref Range Status  01/31/2017 0.93 0.70 - 1.33 mg/dL Final    Comment:    For patients >60 years of age, the reference limit for Creatinine is approximately 13% higher for people identified as African-American. .    Creatinine, Ser  Date Value Ref Range Status  09/06/2022 0.83 0.76 - 1.27 mg/dL Final   Creatinine, POC  Date Value Ref Range Status  07/05/2015 NA mg/dL Final         Failed - K in normal range and within 180 days    Potassium  Date Value Ref Range Status  09/06/2022 4.7 3.5 - 5.2 mmol/L Final         Passed - Patient is not pregnant      Passed - Last BP in normal range    BP Readings from Last 1 Encounters:  03/02/23 123/61         Passed - Valid encounter within last 6 months    Recent Outpatient Visits           1 week ago Chronic, continuous use of opioids   [facility] Malva Limes, MD       Future Appointments             In 3 weeks Sherrie Mustache, Demetrios Isaacs, MD [facility]             traZODone (DESYREL) 100 MG tablet [Pharmacy Med Name: traZODone 100 MG TABLET] 180 tablet 0    Sig: TAKE 2 TABLETS BY MOUTH AT BEDTIME     Psychiatry: Antidepressants - Serotonin Modulator Passed - 07/05/2023 12:12 PM      Passed - Valid encounter within last 6 months    Recent Outpatient Visits           1 week ago Chronic, continuous use of opioids   [facility] Malva [facility], MD       Future Appointments             In 3 weeks Sherrie Mustache, Demetrios Isaacs, MD [facility]             diclofenac (VOLTAREN) 75  MG EC tablet [Pharmacy Med Name: DICLOFENAC SOD EC 75 MG TAB] 180 tablet 0    Sig: TAKE 1 TABLET BY MOUTH 2 TIMES A DAY AS NEEDED FOR MODERATE PAIN.     Analgesics:  NSAIDS Failed - 07/05/2023 12:12 PM      Failed - Manual Review: Labs are only required if the patient has taken medication for more than 8 weeks.      Passed - Cr in normal range and within 360 days    Creat  Date Value Ref Range Status  01/31/2017 0.93 0.70 - 1.33 mg/dL Final    Comment:    For patients >56 years of age, the reference limit for Creatinine is approximately 13% higher for people identified as African-American. .    Creatinine, Ser  Date Value Ref Range Status  09/06/2022 0.83 0.76 - 1.27 mg/dL Final  Creatinine, POC  Date Value Ref Range Status  07/05/2015 NA mg/dL Final         Passed - HGB in normal range and within 360 days    Hemoglobin  Date Value Ref Range Status  09/06/2022 14.6 13.0 - 17.7 g/dL Final         Passed - PLT in normal range and within 360 days    Platelets  Date Value Ref Range Status  09/06/2022 230 150 - 450 x10E3/uL Final         Passed - HCT in normal range and within 360 days    Hematocrit  Date Value Ref Range Status  09/06/2022 44.8 37.5 - 51.0 % Final         Passed - eGFR is 30 or above and within 360 days    GFR, Est African American  Date Value Ref Range Status  01/31/2017 109 > OR = 60 mL/min/1.15m2 Final   GFR calc Af Amer  Date Value Ref Range Status  05/10/2020 106 >59 mL/min/1.73 Final    Comment:    **In accordance with recommendations from the NKF-ASN Task force,**   [facility] is in the process of updating its eGFR calculation to the   2021 CKD-EPI creatinine equation that estimates kidney function   without a race variable.    GFR, Est Non African American  Date Value Ref Range Status  01/31/2017 94 > OR = 60 mL/min/1.33m2 Final   GFR, Estimated  Date Value Ref Range Status  03/13/2022 >60 >60 mL/min Final    Comment:     (NOTE) Calculated using the CKD-EPI Creatinine Equation (2021)    GFR  Date Value Ref Range Status  01/15/2020 85.87 >60.00 mL/min Final   eGFR  Date Value Ref Range Status  09/06/2022 101 >59 mL/min/1.73 Final         Passed - Patient is not pregnant      Passed - Valid encounter within last 12 months    Recent Outpatient Visits           1 week ago Chronic, continuous use of opioids   [facility] Malva Limes, MD       Future Appointments             In 3 weeks Fisher, Demetrios Isaacs, MD [facility], [facility]

## 2023-07-09 ENCOUNTER — Ambulatory Visit: Admitting: Family Medicine

## 2023-07-19 ENCOUNTER — Other Ambulatory Visit: Payer: Self-pay | Admitting: Family Medicine

## 2023-07-23 ENCOUNTER — Other Ambulatory Visit: Payer: Self-pay | Admitting: Family Medicine

## 2023-07-23 DIAGNOSIS — G8928 Other chronic postprocedural pain: Secondary | ICD-10-CM

## 2023-07-23 NOTE — Telephone Encounter (Signed)
 Copied from CRM [id number]. Topic: Clinical - Medication Refill >> Jul 23, 2023 11:29 AM Winnifred Havers wrote: Most Recent Primary Care Visit:  Provider: Lamon Pillow  Department: [facility]  Visit Type: MYCHART VIDEO VISIT  Date: 06/25/2023  Medication: oxyCODONE (ROXICODONE) 15 MG immediate release tablet  Has the patient contacted their pharmacy? Yes (Agent: If no, request that the patient contact the pharmacy for the refill. If patient does not wish to contact the pharmacy document the reason why and proceed with request.) (Agent: If yes, when and what did the pharmacy advise?)  Is this the correct pharmacy for this prescription? Yes If no, delete pharmacy and type the correct one.  This is the patient's preferred pharmacy:  [facility] [id number] [place], [place] - [address] [place] [address] Phone: [phone] Fax: [phone]  [facility] [id number] - [address] [place] - [address] [place] [address] Phone: [phone] Fax: [phone]  [facility] [id number] - [address] [place], [place] - [address] [place] [address] Phone: [phone] Fax: [phone]  [facility] [id number] [facility] [place] - [address] [place] [address] Phone: [phone] Fax: [phone]  [facility] - [place], [place] - [address] [place] [address] Phone: [phone] Fax: [phone]   Has the prescription been filled recently? Yes  Is the patient out of the medication? Yes  Has the patient been seen for an appointment in the last year OR does the patient have an upcoming appointment? Yes  Can we respond through MyChart? Yes  Agent: Please be advised that Rx refills may take up to 3 business days. We ask that you follow-up with your pharmacy.

## 2023-07-24 MED ORDER — OXYCODONE HCL 15 MG PO TABS
15.0000 mg | ORAL_TABLET | ORAL | 0 refills | Status: DC | PRN
Start: 2023-07-24 — End: 2023-08-21

## 2023-07-24 NOTE — Telephone Encounter (Signed)
 Requested medication (s) are due for refill today: Yes  Requested medication (s) are on the active medication list: Yes  Last refill:  06/25/23, #180, 0 refills   Future visit scheduled: Yes  Notes to clinic:  Unable to refill per protocol, cannot delegate.      Requested Prescriptions  Pending Prescriptions Disp Refills   oxyCODONE (ROXICODONE) 15 MG immediate release tablet 180 tablet 0    Sig: Take 1 tablet (15 mg total) by mouth every 4 (four) hours as needed for pain.     Not Delegated - Analgesics:  Opioid Agonists Failed - 07/24/2023 12:46 PM      Failed - This refill cannot be delegated      Failed - Urine Drug Screen completed in last 360 days      Passed - Valid encounter within last 3 months    Recent Outpatient Visits           4 weeks ago Chronic, continuous use of opioids   [facility] Lamon Pillow, MD       Future Appointments             In 3 days Fisher, Erlinda Haws, MD [facility]

## 2023-07-27 ENCOUNTER — Ambulatory Visit (INDEPENDENT_AMBULATORY_CARE_PROVIDER_SITE_OTHER): Admitting: Family Medicine

## 2023-07-27 ENCOUNTER — Encounter: Payer: Self-pay | Admitting: Family Medicine

## 2023-07-27 VITALS — BP 141/69 | Resp 16 | Ht 68.0 in | Wt 222.0 lb

## 2023-07-27 DIAGNOSIS — E1169 Type 2 diabetes mellitus with other specified complication: Secondary | ICD-10-CM | POA: Diagnosis not present

## 2023-07-27 DIAGNOSIS — Z125 Encounter for screening for malignant neoplasm of prostate: Secondary | ICD-10-CM | POA: Diagnosis not present

## 2023-07-27 DIAGNOSIS — Z860100 Personal history of colon polyps, unspecified: Secondary | ICD-10-CM

## 2023-07-27 DIAGNOSIS — I700 Atherosclerosis of aorta: Secondary | ICD-10-CM | POA: Diagnosis not present

## 2023-07-27 DIAGNOSIS — E785 Hyperlipidemia, unspecified: Secondary | ICD-10-CM

## 2023-07-27 DIAGNOSIS — Z794 Long term (current) use of insulin: Secondary | ICD-10-CM

## 2023-07-27 DIAGNOSIS — E119 Type 2 diabetes mellitus without complications: Secondary | ICD-10-CM

## 2023-07-27 DIAGNOSIS — K5909 Other constipation: Secondary | ICD-10-CM

## 2023-07-27 MED ORDER — TRULANCE 3 MG PO TABS: 3.0000 mg | ORAL_TABLET | Freq: Every day | ORAL | 5 refills | Status: DC

## 2023-07-27 NOTE — Progress Notes (Signed)
 Established patient visit   Patient: Vincent Hall   DOB: 1965-08-29   58 y.o. Male  MRN: [id number] Visit Date: 07/27/2023  Today's healthcare provider: Nancyann Perry, MD   Chief Complaint  Patient presents with   Medical Management of Chronic Issues   Subjective    HPI  Follow up hypertension, lipids, cad, and diabetes. Feels well. No complaints. Tolerating mounjaro  12.5 with no side effects. He does have history chronic constipation, but no worse since being on [name] . Previously did well with Linzess  and Trulance  which he would like to start back on. Reports sugars mostly in the low 100s, stopped insulin  because he was getting hyplycemia.   Lab Results  Component Value Date   HGBA1C 6.9 (A) 03/02/2023   HGBA1C 8.8 (H) 09/06/2022   HGBA1C 7.7 (A) 04/21/2022   Lab Results  Component Value Date   NA 134 09/06/2022   CL 98 09/06/2022   K 4.7 09/06/2022   CO2 21 09/06/2022   BUN 10 09/06/2022   CREATININE 0.83 09/06/2022   EGFR 101 09/06/2022   CALCIUM  9.5 09/06/2022   PHOS 3.0 03/10/2016   ALBUMIN 4.6 09/06/2022   GLUCOSE 268 (H) 09/06/2022   Lab Results  Component Value Date   CHOL 176 09/06/2022   HDL 54 09/06/2022   LDLCALC 97 09/06/2022   TRIG 145 09/06/2022   CHOLHDL 3.3 09/06/2022     Medications: Outpatient Medications Prior to Visit  Medication Sig   albuterol  (VENTOLIN  HFA) 108 (90 Base) MCG/ACT inhaler Inhale 2 puffs into the lungs every 6 (six) hours as needed for wheezing or shortness of breath.   Alum & Mag Hydroxide-Simeth (GI COCKTAIL) SUSP suspension Take 30 mLs by mouth 2 (two) times daily as needed for indigestion (abd pain). Shake well. Each dose to containe 15mL maalox and 15mL viscous lidocaine    aspirin  81 MG tablet Take 81 mg by mouth daily.   azelastine  (OPTIVAR ) 0.05 % ophthalmic solution INSTILL ONE DROP INTO THE AFFECTED EYE(S) TWICE DAILY   Blood Glucose Monitoring Suppl (ONE TOUCH ULTRA 2) w/Device KIT USE AS DIRECTED    citalopram  (CELEXA ) 40 MG tablet Take 1 tablet (40 mg total) by mouth daily.   cyclobenzaprine  (FLEXERIL ) 5 MG tablet TAKE 1 TABLET BY MOUTH 3 TIMES A DAY AS NEEDED FOR MUSCLE SPASMS   diclofenac  (VOLTAREN ) 75 MG EC tablet TAKE 1 TABLET BY MOUTH 2 TIMES A DAY AS NEEDED FOR MODERATE PAIN.   Evolocumab  (REPATHA ) 140 MG/ML SOSY Inject 140 mg into the skin every 14 (fourteen) days.   ezetimibe  (ZETIA ) 10 MG tablet Take 1 tablet (10 mg total) by mouth daily.   glipiZIDE  (GLUCOTROL  XL) 10 MG 24 hr tablet TAKE 1 TABLET BY MOUTH 2 TIMES A DAY BEFORE LUNCH AND SUPPER.   glucose blood (CONTOUR TEST) test strip Check blood sugars 3 times daily. E11.65 (uncontrolled insulin  dependent diabetes)   insulin  aspart (NOVOLOG  FLEXPEN) 100 UNIT/ML FlexPen Use before meals up to three times a day. Take 6 units if sugar is over 200. 8 units if over 250 and 10 units if over 300.   insulin  glargine, 2 Unit Dial , (TOUJEO  MAX) 300 UNIT/ML Solostar Pen Inject 40 Units into the skin daily. (Patient not taking: Reported on 07/27/2023)   Insulin  Pen Needle 32G X 4 MM MISC 1 each by Does not apply route 4 (four) times daily -  before meals and at bedtime.   lisinopril  (ZESTRIL ) 20 MG tablet TAKE 1 TABLET BY  MOUTH DAILY   naloxone  (NARCAN ) nasal spray 4 mg/0.1 mL Place 1 spray into the nose as needed.   nitroGLYCERIN  (NITROSTAT ) 0.4 MG SL tablet DISSOLVE 1 TAB UNDER TONGUE FOR CHEST PAIN - IF PAIN REMAINS AFTER 5 MIN, CALL 911 AND REPEAT DOSE. MAX 3 TABS IN 15 MINUTES   oxyCODONE  (ROXICODONE ) 15 MG immediate release tablet Take 1 tablet (15 mg total) by mouth every 4 (four) hours as needed for pain.   pioglitazone  (ACTOS ) 30 MG tablet TAKE 1 TABLET BY MOUTH DAILY   RABEprazole  (ACIPHEX ) 20 MG tablet Take 1 tablet (20 mg total) by mouth daily.   rosuvastatin  (CRESTOR ) 40 MG tablet TAKE 1 TABLET BY MOUTH DAILY   tirzepatide  ([name] ) 12.5 MG/0.5ML Pen INJECT 12.5 MG UNDER THE SKIN ONCE WEEKLY   traZODone  (DESYREL ) 100 MG tablet  TAKE 2 TABLETS BY MOUTH AT BEDTIME   triamcinolone  cream (KENALOG ) 0.1 % Apply 1 Application topically 2 (two) times daily.   No facility-administered medications prior to visit.    Review of Systems  Constitutional:  Negative for appetite change, chills and fever.  Respiratory:  Negative for chest tightness, shortness of breath and wheezing.   Cardiovascular:  Negative for chest pain and palpitations.  Gastrointestinal:  Negative for abdominal pain, nausea and vomiting.       Objective    BP (!) 141/69 (BP Location: Left Arm, Patient Position: Sitting, Cuff Size: Large)   Resp 16   Ht 5' 8 (1.727 m)   Wt 222 lb (100.7 kg)   SpO2 98%   BMI 33.75 kg/m    Physical Exam   General: Appearance:    Mildly obese male in no acute distress  Eyes:    PERRL, conjunctiva/corneas clear, EOM's intact       Lungs:     Clear to auscultation bilaterally, respirations unlabored  Heart:    Normal heart rate. Normal rhythm. No murmurs, rubs, or gallops.    MS:   All extremities are intact.    Neurologic:   Awake, alert, oriented x 3. No apparent focal neurological defect.         Assessment & Plan     1. Type 2 diabetes mellitus without complication, with long-term current use of insulin  (HCC) (Primary) Doing well on 12.5mg  [name] . Does have chronic constipation, but no worse since starting [name] . Anticipate reducing glipizide  if A1c continues to improve.  - Hemoglobin A1c  2. Hyperlipidemia associated with type 2 diabetes mellitus (HCC) He is tolerating rosuvastatin  and Repatha  well with no adverse effects.   - CBC - Comprehensive metabolic panel with GFR - Lipid panel  3. Aortic atherosclerosis (HCC) Asymptomatic. Compliant with medication.  Continue aggressive risk factor modification.    4. Prostate cancer screening  - PSA Total (Reflex To Free)  5. History of adenomatous colonic polyps Due for Q 5 year colonoscopy - Ambulatory referral to Gastroenterology  6. Chronic  constipation Did well with Trulance  in the past which he has been out of for quite awhile. He would like to restart.  - Plecanatide  (TRULANCE ) 3 MG TABS; Take 1 tablet (3 mg total) by mouth daily.  Dispense: 30 tablet; Refill: 5         Nancyann Perry, MD  [facility] [phone] (phone) [phone] (fax)  [facility]

## 2023-07-28 LAB — COMPREHENSIVE METABOLIC PANEL WITH GFR
ALT: 21 IU/L (ref 0–44)
AST: 21 IU/L (ref 0–40)
Albumin: 4.6 g/dL (ref 3.8–4.9)
Alkaline Phosphatase: 55 IU/L (ref 44–121)
BUN/Creatinine Ratio: 9 (ref 9–20)
BUN: 10 mg/dL (ref 6–24)
Bilirubin Total: 0.2 mg/dL (ref 0.0–1.2)
CO2: 24 mmol/L (ref 20–29)
Calcium: 9.5 mg/dL (ref 8.7–10.2)
Chloride: 100 mmol/L (ref 96–106)
Creatinine, Ser: 1.12 mg/dL (ref 0.76–1.27)
Globulin, Total: 2.3 g/dL (ref 1.5–4.5)
Glucose: 146 mg/dL — ABNORMAL HIGH (ref 70–99)
Potassium: 4.4 mmol/L (ref 3.5–5.2)
Sodium: 138 mmol/L (ref 134–144)
Total Protein: 6.9 g/dL (ref 6.0–8.5)
eGFR: 76 mL/min/{1.73_m2} (ref >59)

## 2023-07-28 LAB — HEMOGLOBIN A1C
Est. average glucose Bld gHb Est-mCnc: 148 mg/dL
Hgb A1c MFr Bld: 6.8 % — ABNORMAL HIGH (ref 4.8–5.6)

## 2023-07-28 LAB — CBC
Hematocrit: 44.6 % (ref 37.5–51.0)
Hemoglobin: 14.3 g/dL (ref 13.0–17.7)
MCH: 27.1 pg (ref 26.6–33.0)
MCHC: 32.1 g/dL (ref 31.5–35.7)
MCV: 85 fL (ref 79–97)
Platelets: 215 10*3/uL (ref 150–450)
RBC: 5.28 x10E6/uL (ref 4.14–5.80)
RDW: 12.9 % (ref 11.6–15.4)
WBC: 9.3 10*3/uL (ref 3.4–10.8)

## 2023-07-28 LAB — LIPID PANEL
Chol/HDL Ratio: 1.6 ratio (ref 0.0–5.0)
Cholesterol, Total: 74 mg/dL — ABNORMAL LOW (ref 100–199)
HDL: 47 mg/dL (ref >39)
LDL Chol Calc (NIH): 4 mg/dL (ref 0–99)
Triglycerides: 134 mg/dL (ref 0–149)
VLDL Cholesterol Cal: 23 mg/dL (ref 5–40)

## 2023-07-28 LAB — PSA TOTAL (REFLEX TO FREE): Prostate Specific Ag, Serum: 0.1 ng/mL (ref 0.0–4.0)

## 2023-07-29 ENCOUNTER — Other Ambulatory Visit: Payer: Self-pay | Admitting: Family Medicine

## 2023-07-29 ENCOUNTER — Encounter: Payer: Self-pay | Admitting: Family Medicine

## 2023-07-29 DIAGNOSIS — E1169 Type 2 diabetes mellitus with other specified complication: Secondary | ICD-10-CM

## 2023-07-29 MED ORDER — GLIPIZIDE ER 10 MG PO TB24: 10.0000 mg | ORAL_TABLET | Freq: Every day | ORAL | Status: DC

## 2023-07-30 ENCOUNTER — Telehealth: Payer: Self-pay

## 2023-07-30 ENCOUNTER — Other Ambulatory Visit (HOSPITAL_COMMUNITY): Payer: Self-pay

## 2023-07-30 NOTE — Telephone Encounter (Signed)
 Pharmacy Patient Advocate Encounter   Received notification from Onbase that prior authorization for Trulance  3MG  tablets is required/requested.   Insurance verification completed.   The patient is insured through [facility] .   Per test claim: PA required; PA submitted to above mentioned insurance via CoverMyMeds Key/confirmation #[id number] Status is pending

## 2023-07-30 NOTE — Telephone Encounter (Signed)
 Pharmacy Patient Advocate Encounter  Received notification from [facility] [place] that Prior Authorization for Trulance  3MG  tablets has been DENIED.  Full denial letter will be uploaded to the media tab. See denial reason below.   PA #/Case ID/Reference #: [id number]

## 2023-08-14 ENCOUNTER — Other Ambulatory Visit: Payer: Self-pay | Admitting: Family Medicine

## 2023-08-14 ENCOUNTER — Telehealth: Payer: Self-pay | Admitting: Family Medicine

## 2023-08-14 DIAGNOSIS — E1165 Type 2 diabetes mellitus with hyperglycemia: Secondary | ICD-10-CM

## 2023-08-14 NOTE — Telephone Encounter (Signed)
 [facility] is requesting refill ezetimibe  (ZETIA ) 10 MG tablet  Please advise

## 2023-08-15 MED ORDER — EZETIMIBE 10 MG PO TABS: 10.0000 mg | ORAL_TABLET | Freq: Every day | ORAL | 3 refills | Status: DC

## 2023-08-15 NOTE — Telephone Encounter (Signed)
 Refilled

## 2023-08-15 NOTE — Telephone Encounter (Signed)
 Please verify if patient is still taking Toujeo . I though he said he was off of insulin  at his last visit.... maybe he was just talking about the Novolog , I'm not sure if he meant the Toujeo  too.

## 2023-08-16 NOTE — Telephone Encounter (Signed)
 LMTCB-if patient calls back please give patient providers message.

## 2023-08-21 ENCOUNTER — Other Ambulatory Visit (HOSPITAL_COMMUNITY): Payer: Self-pay

## 2023-08-21 ENCOUNTER — Other Ambulatory Visit: Payer: Self-pay | Admitting: Family Medicine

## 2023-08-21 DIAGNOSIS — E1165 Type 2 diabetes mellitus with hyperglycemia: Secondary | ICD-10-CM

## 2023-08-21 DIAGNOSIS — E1169 Type 2 diabetes mellitus with other specified complication: Secondary | ICD-10-CM

## 2023-08-21 DIAGNOSIS — G8928 Other chronic postprocedural pain: Secondary | ICD-10-CM

## 2023-08-21 NOTE — Telephone Encounter (Signed)
 Pharmacy Patient Advocate Encounter  Received notification from [facility] that Prior Authorization for Trulance  3 mg tablets has been APPROVED from 08/21/23 to 08/20/24. Ran test claim, Copay is $0. This test claim was processed through [facility]- copay amounts may vary at other pharmacies due to pharmacy/plan contracts, or as the patient moves through the different stages of their insurance plan.   PA #/Case ID/Reference #: --

## 2023-08-21 NOTE — Telephone Encounter (Unsigned)
 Copied from CRM [id number]. Topic: Clinical - Medication Refill >> Aug 21, 2023  2:08 PM Essie A wrote: Medication: oxyCODONE  (ROXICODONE ) 15 MG immediate release tablet  Has the patient contacted their pharmacy? Yes (Agent: If no, request that the patient contact the pharmacy for the refill. If patient does not wish to contact the pharmacy document the reason why and proceed with request.) (Agent: If yes, when and what did the pharmacy advise?)  This is the patient's preferred pharmacy:  [facility] [id number] [place], [place] - [address] [place] [address] Phone: [phone] Fax: [phone]  Is this the correct pharmacy for this prescription? Yes If no, delete pharmacy and type the correct one.   Has the prescription been filled recently? Yes  Is the patient out of the medication? No  Has the patient been seen for an appointment in the last year OR does the patient have an upcoming appointment? Yes  Can we respond through MyChart? No  Agent: Please be advised that Rx refills may take up to 3 business days. We ask that you follow-up with your pharmacy.

## 2023-08-21 NOTE — Telephone Encounter (Signed)
 PA Renewed, corrected and resubmitted 08/21/2023

## 2023-08-22 MED ORDER — OXYCODONE HCL 15 MG PO TABS: 15.0000 mg | ORAL_TABLET | ORAL | 0 refills | Status: DC | PRN

## 2023-08-22 NOTE — Telephone Encounter (Signed)
 Requested medication (s) are due for refill today -yes  Requested medication (s) are on the active medication list -yes  Future visit scheduled -no  Last refill: 07/24/23 #180   Notes to clinic: non delegated Rx  Requested Prescriptions  Pending Prescriptions Disp Refills   oxyCODONE  (ROXICODONE ) 15 MG immediate release tablet 180 tablet 0    Sig: Take 1 tablet (15 mg total) by mouth every 4 (four) hours as needed for pain.     Not Delegated - Analgesics:  Opioid Agonists Failed - 08/22/2023  9:26 AM      Failed - This refill cannot be delegated      Failed - Urine Drug Screen completed in last 360 days      Passed - Valid encounter within last 3 months    Recent Outpatient Visits           3 weeks ago Type 2 diabetes mellitus without complication, with long-term current use of insulin  (HCC)   [place] [facility] Lamon Pillow, MD   1 month ago Chronic, continuous use of opioids   [facility] Lamon Pillow, MD                 Requested Prescriptions  Pending Prescriptions Disp Refills   oxyCODONE  (ROXICODONE ) 15 MG immediate release tablet 180 tablet 0    Sig: Take 1 tablet (15 mg total) by mouth every 4 (four) hours as needed for pain.     Not Delegated - Analgesics:  Opioid Agonists Failed - 08/22/2023  9:26 AM      Failed - This refill cannot be delegated      Failed - Urine Drug Screen completed in last 360 days      Passed - Valid encounter within last 3 months    Recent Outpatient Visits           3 weeks ago Type 2 diabetes mellitus without complication, with long-term current use of insulin  [facility])   [place] [facility] Lamon Pillow, MD   1 month ago Chronic, continuous use of opioids   [facility] Lamon Pillow, MD

## 2023-09-11 ENCOUNTER — Telehealth: Payer: Self-pay | Admitting: Family Medicine

## 2023-09-11 ENCOUNTER — Other Ambulatory Visit: Payer: Self-pay

## 2023-09-11 DIAGNOSIS — E1169 Type 2 diabetes mellitus with other specified complication: Secondary | ICD-10-CM

## 2023-09-11 MED ORDER — EZETIMIBE 10 MG PO TABS
10.0000 mg | ORAL_TABLET | Freq: Every day | ORAL | 3 refills | Status: AC
Start: 2023-09-11 — End: ?

## 2023-09-11 NOTE — Telephone Encounter (Signed)
Sent in medication refill

## 2023-09-11 NOTE — Telephone Encounter (Signed)
 [facility] pharmacy faxed refill request for the following medications   ezetimibe  (ZETIA ) 10 MG tablet    Please advise

## 2023-09-17 ENCOUNTER — Other Ambulatory Visit: Payer: Self-pay | Admitting: Family Medicine

## 2023-09-17 DIAGNOSIS — G8928 Other chronic postprocedural pain: Secondary | ICD-10-CM

## 2023-09-17 NOTE — Telephone Encounter (Unsigned)
 Copied from CRM [id number]. Topic: Clinical - Medication Refill >> Sep 17, 2023 12:08 PM Fredrica W wrote: Medication: oxyCODONE  (ROXICODONE ) 15 MG immediate release tablet  Has the patient contacted their pharmacy? No (Agent: If no, request that the patient contact the pharmacy for the refill. If patient does not wish to contact the pharmacy document the reason why and proceed with request.) (Agent: If yes, when and what did the pharmacy advise?)  This is the patient's preferred pharmacy:  [facility] [id number] [place], [place] - [address] [place] [address] Phone: [phone] Fax: [phone]   Is this the correct pharmacy for this prescription? Yes If no, delete pharmacy and type the correct one.   Has the prescription been filled recently? Yes  Is the patient out of the medication? No  Has the patient been seen for an appointment in the last year OR does the patient have an upcoming appointment? Yes  Can we respond through MyChart? No  Agent: Please be advised that Rx refills may take up to 3 business days. We ask that you follow-up with your pharmacy.

## 2023-09-18 NOTE — Telephone Encounter (Signed)
 Requested medications are due for refill today.  Refill due 09/22/2023  Requested medications are on the active medications list.  yes  Last refill. 08/22/2023 #180 0 rf  Future visit scheduled.   no  Notes to clinic.  Refill not delegated.    Requested Prescriptions  Pending Prescriptions Disp Refills   oxyCODONE  (ROXICODONE ) 15 MG immediate release tablet 180 tablet 0    Sig: Take 1 tablet (15 mg total) by mouth every 4 (four) hours as needed for pain.     Not Delegated - Analgesics:  Opioid Agonists Failed - 09/18/2023 12:31 PM      Failed - This refill cannot be delegated      Failed - Urine Drug Screen completed in last 360 days      Passed - Valid encounter within last 3 months    Recent Outpatient Visits           1 month ago Type 2 diabetes mellitus without complication, with long-term current use of insulin  [facility])   [facility] Lamon Pillow, MD   2 months ago Chronic, continuous use of opioids   [facility] Lamon Pillow, MD

## 2023-09-20 MED ORDER — OXYCODONE HCL 15 MG PO TABS: 15.0000 mg | ORAL_TABLET | ORAL | 0 refills | Status: DC | PRN

## 2023-10-05 ENCOUNTER — Other Ambulatory Visit: Payer: Self-pay | Admitting: Family Medicine

## 2023-10-05 DIAGNOSIS — F5101 Primary insomnia: Secondary | ICD-10-CM

## 2023-10-15 ENCOUNTER — Other Ambulatory Visit: Payer: Self-pay | Admitting: Family Medicine

## 2023-10-15 DIAGNOSIS — G8928 Other chronic postprocedural pain: Secondary | ICD-10-CM

## 2023-10-15 NOTE — Telephone Encounter (Unsigned)
 Copied from CRM [id number]. Topic: Clinical - Medication Refill >> Oct 15, 2023  1:40 PM Ivette P wrote: Medication: oxyCODONE  (ROXICODONE ) 15 MG immediate release tablet   Has the patient contacted their pharmacy? No (Agent: If no, request that the patient contact the pharmacy for the refill. If patient does not wish to contact the pharmacy document the reason why and proceed with request.) (Agent: If yes, when and what did the pharmacy advise?)  This is the patient's preferred pharmacy:  [facility] [id number] [place], [place] - [address] [place] [address] Phone: [phone] Fax: [phone]   Is this the correct pharmacy for this prescription? Yes If no, delete pharmacy and type the correct one.   Has the prescription been filled recently? No, 09/20/2023  Is the patient out of the medication? Yes  Has the patient been seen for an appointment in the last year OR does the patient have an upcoming appointment? Yes, 07/27/2023  Can we respond through MyChart? Yes  Agent: Please be advised that Rx refills may take up to 3 business days. We ask that you follow-up with your pharmacy.

## 2023-10-17 NOTE — Telephone Encounter (Signed)
 Requested medication (s) are due for refill today: yes  Requested medication (s) are on the active medication list: yes  Last refill:  08/20/23  Future visit scheduled: yes  Notes to clinic:  Unable to refill per protocol, cannot delegate.      Requested Prescriptions  Pending Prescriptions Disp Refills   oxyCODONE  (ROXICODONE ) 15 MG immediate release tablet 180 tablet 0    Sig: Take 1 tablet (15 mg total) by mouth every 4 (four) hours as needed for pain.     Not Delegated - Analgesics:  Opioid Agonists Failed - 10/17/2023  2:13 PM      Failed - This refill cannot be delegated      Failed - Urine Drug Screen completed in last 360 days      Passed - Valid encounter within last 3 months    Recent Outpatient Visits           2 months ago Type 2 diabetes mellitus without complication, with long-term current use of insulin  [facility])   [facility] Nancy[facility] BRAVO, [facility]   3 months ago Chronic, continuous use of opioids   [place] [facility] Nancyann BRAVO, MD

## 2023-10-18 MED ORDER — OXYCODONE HCL 15 MG PO TABS: 15.0000 mg | ORAL_TABLET | ORAL | 0 refills | Status: DC | PRN

## 2023-11-09 ENCOUNTER — Other Ambulatory Visit: Payer: Self-pay | Admitting: Family Medicine

## 2023-11-09 DIAGNOSIS — E1169 Type 2 diabetes mellitus with other specified complication: Secondary | ICD-10-CM

## 2023-11-13 ENCOUNTER — Other Ambulatory Visit: Payer: Self-pay | Admitting: Family Medicine

## 2023-11-13 DIAGNOSIS — G8928 Other chronic postprocedural pain: Secondary | ICD-10-CM

## 2023-11-13 NOTE — Telephone Encounter (Unsigned)
 Copied from CRM #[id number]. Topic: Clinical - Medication Refill >> Nov 13, 2023  9:52 AM Jasmin G wrote: Medication: oxyCODONE  (ROXICODONE ) 15 MG immediate release tablet  Has the patient contacted their pharmacy? No (Agent: If no, request that the patient contact the pharmacy for the refill. If patient does not wish to contact the pharmacy document the reason why and proceed with request.) (Agent: If yes, when and what did the pharmacy advise?)  This is the patient's preferred pharmacy:  [facility] [id number] [place], [place] - [address] [place] [address] Phone: [phone] Fax: [phone]  Is this the correct pharmacy for this prescription? Yes If no, delete pharmacy and type the correct one.   Has the prescription been filled recently? No  Is the patient out of the medication? No  Has the patient been seen for an appointment in the last year OR does the patient have an upcoming appointment? Yes  Can we respond through MyChart? Yes  Agent: Please be advised that Rx refills may take up to 3 business days. We ask that you follow-up with your pharmacy.

## 2023-11-14 NOTE — Telephone Encounter (Signed)
 Requested medication (s) are due for refill today -yes  Requested medication (s) are on the active medication list -yes  Future visit scheduled -no  Last refill: 10/18/23 #180  Notes to clinic: non delegated Rx  Requested Prescriptions  Pending Prescriptions Disp Refills   oxyCODONE  (ROXICODONE ) 15 MG immediate release tablet 180 tablet 0    Sig: Take 1 tablet (15 mg total) by mouth every 4 (four) hours as needed for pain.     Not Delegated - Analgesics:  Opioid Agonists Failed - 11/14/2023  4:02 PM      Failed - This refill cannot be delegated      Failed - Urine Drug Screen completed in last 360 days      Failed - Valid encounter within last 3 months    Recent Outpatient Visits           3 months ago Type 2 diabetes mellitus without complication, with long-term current use of insulin  (HCC)   [place] BRAVO, MD   4 months ago Chronic, continuous use of opioids   [facility] Nancy[facility] BRAVO, MD                 Requested Prescriptions  Pending Prescriptions Disp Refills   oxyCODONE  (ROXICODONE ) 15 MG immediate release tablet 180 tablet 0    Sig: Take 1 tablet (15 mg total) by mouth every 4 (four) hours as needed for pain.     Not Delegated - Analgesics:  Opioid Agonists Failed - 11/14/2023  4:02 PM      Failed - This refill cannot be delegated      Failed - Urine Drug Screen completed in last 360 days      Failed - Valid encounter within last 3 months    Recent Outpatient Visits           3 months ago Type 2 diabetes mellitus without complication, with long-term current use of insulin  [facility])   [place] [facility] Nancy[facility] BRAVO, MD   4 months ago Chronic, continuous use of opioids   [facility] Nancy[facility] BRAVO, MD

## 2023-11-15 MED ORDER — OXYCODONE HCL 15 MG PO TABS: 15.0000 mg | ORAL_TABLET | ORAL | 0 refills | Status: DC | PRN

## 2023-12-11 DIAGNOSIS — Z1389 Encounter for screening for other disorder: Secondary | ICD-10-CM | POA: Diagnosis not present

## 2023-12-11 DIAGNOSIS — F4323 Adjustment disorder with mixed anxiety and depressed mood: Secondary | ICD-10-CM | POA: Diagnosis not present

## 2023-12-13 ENCOUNTER — Telehealth: Payer: Self-pay | Admitting: Family Medicine

## 2023-12-13 NOTE — Telephone Encounter (Signed)
 Copied from CRM [id number]. Topic: Clinical - Medication Refill >> Dec 13, 2023  2:17 PM DeAngela L wrote: Medication: oxyCODONE  (ROXICODONE ) 15 MG immediate release tablet  Has the patient contacted their pharmacy? Yes  (Agent: If no, request that the patient contact the pharmacy for the refill. If patient does not wish to contact the pharmacy document the reason why and proceed with request.) (Agent: If yes, when and what did the pharmacy advise?)  This is the patient's preferred pharmacy:  [facility] [id number] [place], [place] - [address] [place] [address] Phone: [phone] Fax: [phone]  Is this the correct pharmacy for this prescription? Yes  If no, delete pharmacy and type the correct one.   Has the prescription been filled recently? Yes   Is the patient out of the medication? No   Has the patient been seen for an appointment in the last year OR does the patient have an upcoming appointment? Yes   Can we respond through MyChart? Yes   Agent: Please be advised that Rx refills may take up to 3 business days. We ask that you follow-up with your pharmacy.

## 2023-12-13 NOTE — Telephone Encounter (Signed)
 He is overdue for follow up office visit and needs to schedule before rf can be approved.

## 2023-12-13 NOTE — Telephone Encounter (Signed)
 Office visit needed for rx refill letter sent to patient via mychart

## 2023-12-13 NOTE — Telephone Encounter (Signed)
 Please see the message below  LOV-07/27/23 NOV-NO NEW  LFR-8/25

## 2023-12-14 ENCOUNTER — Telehealth: Payer: Self-pay

## 2023-12-14 NOTE — Telephone Encounter (Signed)
 Spoke with patient, moved appt to 9/8 and in person. Patient states he can hold off on the refill until Monday

## 2023-12-14 NOTE — Telephone Encounter (Signed)
 Patient had requested pain meds    Copied from CRM #[id number]. Topic: Clinical - Prescription Issue >> Dec 13, 2023  4:01 PM Emylou G wrote: Reason for CRM: Made video appt.. wants to know if he can still get refill.. even w/the appt scheduled

## 2023-12-14 NOTE — Telephone Encounter (Signed)
 He needs to be seen in the office or diabetes and blood pressure. The appointment needs to be in-person, not video.

## 2023-12-17 ENCOUNTER — Other Ambulatory Visit: Payer: Self-pay

## 2023-12-17 ENCOUNTER — Ambulatory Visit: Admitting: Family Medicine

## 2023-12-17 ENCOUNTER — Encounter: Payer: Self-pay | Admitting: Family Medicine

## 2023-12-17 VITALS — BP 116/68 | HR 60 | Resp 16 | Ht 68.0 in | Wt 218.6 lb

## 2023-12-17 DIAGNOSIS — F430 Acute stress reaction: Secondary | ICD-10-CM | POA: Diagnosis not present

## 2023-12-17 DIAGNOSIS — Z87891 Personal history of nicotine dependence: Secondary | ICD-10-CM

## 2023-12-17 DIAGNOSIS — Z122 Encounter for screening for malignant neoplasm of respiratory organs: Secondary | ICD-10-CM

## 2023-12-17 DIAGNOSIS — R110 Nausea: Secondary | ICD-10-CM

## 2023-12-17 DIAGNOSIS — E119 Type 2 diabetes mellitus without complications: Secondary | ICD-10-CM | POA: Diagnosis not present

## 2023-12-17 DIAGNOSIS — G8928 Other chronic postprocedural pain: Secondary | ICD-10-CM

## 2023-12-17 DIAGNOSIS — Z794 Long term (current) use of insulin: Secondary | ICD-10-CM | POA: Diagnosis not present

## 2023-12-17 DIAGNOSIS — Z23 Encounter for immunization: Secondary | ICD-10-CM | POA: Diagnosis not present

## 2023-12-17 DIAGNOSIS — E1169 Type 2 diabetes mellitus with other specified complication: Secondary | ICD-10-CM

## 2023-12-17 DIAGNOSIS — E785 Hyperlipidemia, unspecified: Secondary | ICD-10-CM

## 2023-12-17 LAB — POCT GLYCOSYLATED HEMOGLOBIN (HGB A1C): Hemoglobin A1C: 7.3 % — AB (ref 4.0–5.6)

## 2023-12-17 MED ORDER — ALPRAZOLAM 0.5 MG PO TABS: 0.5000 mg | ORAL_TABLET | Freq: Every day | ORAL | 0 refills | Status: DC | PRN

## 2023-12-17 MED ORDER — OXYCODONE HCL 15 MG PO TABS: 15.0000 mg | ORAL_TABLET | ORAL | 0 refills | Status: DC | PRN

## 2023-12-17 NOTE — Progress Notes (Signed)
 Established patient visit   Patient: Vincent Hall   DOB: 10/28/1965   58 y.o. Male  MRN: [id number] Visit Date: 12/17/2023  Today's healthcare provider: Nancyann Perry, MD   Chief Complaint  Patient presents with   Medical Management of Chronic Issues   Subjective    Discussed the use of AI scribe software for clinical note transcription with the patient, who gave verbal consent to proceed.  History of Present Illness   Vincent Hall is a 58 year old male with diabetes and hyperlipidemia who presents with nausea and anxiety.  He has been experiencing nausea for about a week. He is currently on [name]  at a dose of 12.5 mg, which he has been taking for a while without previous side effects. He does not believe the nausea is related to this medication as he has not experienced side effects from it before.  He is concerned about his cholesterol and triglyceride levels and requests to have them checked. He is currently taking rosuvastatin  for cholesterol management.  He has increased anxiety, which he attributes to the recent passing of his father and frequent travel to [place]  to assist his mother. He has a history of using alprazolam  for anxiety and requests a prescription renewal. He mentions that he uses it sparingly, only when he feels particularly anxious. He is also taking citalopram  once daily.  He is currently using mealtime insulin  at a dose of 40 units once a day. His A1c is 7.3, up from his previous reading in April. He has made dietary changes, including reducing sugar intake and eliminating ice cream, to improve his blood sugar control.  He quit smoking in 2015 and has been smoke-free for about 10 years. No chest pain, heart flutters, or shortness of breath. His blood pressure has been stable, and he monitors it regularly.     Wt Readings from Last 3 Encounters:  12/17/23 218 lb 9.6 oz (99.2 kg)  07/27/23 222 lb (100.7 kg)  09/06/22 218 lb (98.9 kg)      Medications: Outpatient Medications Prior to Visit  Medication Sig   albuterol  (VENTOLIN  HFA) 108 (90 Base) MCG/ACT inhaler Inhale 2 puffs into the lungs every 6 (six) hours as needed for wheezing or shortness of breath.   Alum & Mag Hydroxide-Simeth (GI COCKTAIL) SUSP suspension Take 30 mLs by mouth 2 (two) times daily as needed for indigestion (abd pain). Shake well. Each dose to containe 15mL maalox and 15mL viscous lidocaine    aspirin  81 MG tablet Take 81 mg by mouth daily.   azelastine  (OPTIVAR ) 0.05 % ophthalmic solution INSTILL ONE DROP INTO THE AFFECTED EYE(S) TWICE DAILY   Blood Glucose Monitoring Suppl (ONE TOUCH ULTRA 2) w/Device KIT USE AS DIRECTED   citalopram  (CELEXA ) 40 MG tablet Take 1 tablet (40 mg total) by mouth daily.   cyclobenzaprine  (FLEXERIL ) 5 MG tablet TAKE 1 TABLET BY MOUTH 3 TIMES A DAY AS NEEDED FOR MUSCLE SPASMS   diclofenac  (VOLTAREN ) 75 MG EC tablet TAKE 1 TABLET BY MOUTH 2 TIMES A DAY AS NEEDED FOR MODERATE PAIN.   Evolocumab  (REPATHA ) 140 MG/ML SOSY Inject 140 mg into the skin every 14 (fourteen) days.   ezetimibe  (ZETIA ) 10 MG tablet Take 1 tablet (10 mg total) by mouth daily.   glipiZIDE  (GLUCOTROL  XL) 10 MG 24 hr tablet TAKE 1 TABLET BY MOUTH 2 TIMES A DAY BEFORE LUNCH AND SUPPER   insulin  glargine, 2 Unit Dial , (TOUJEO  MAX SOLOSTAR) 300 UNIT/ML Solostar  Pen INJECT 40 UNITS INTO THE SKIN DAILY   Insulin  Pen Needle 32G X 4 MM MISC 1 each by Does not apply route 4 (four) times daily -  before meals and at bedtime.   lisinopril  (ZESTRIL ) 20 MG tablet TAKE 1 TABLET BY MOUTH DAILY   [name]  12.5 MG/0.5ML Pen INJECT 12.5 MG UNDER THE SKIN ONCE WEEKLY   naloxone  (NARCAN ) nasal spray 4 mg/0.1 mL Place 1 spray into the nose as needed.   nitroGLYCERIN  (NITROSTAT ) 0.4 MG SL tablet DISSOLVE 1 TAB UNDER TONGUE FOR CHEST PAIN - IF PAIN REMAINS AFTER 5 MIN, CALL 911 AND REPEAT DOSE. MAX 3 TABS IN 15 MINUTES   pioglitazone  (ACTOS ) 30 MG tablet TAKE 1 TABLET BY MOUTH  DAILY   Plecanatide  (TRULANCE ) 3 MG TABS Take 1 tablet (3 mg total) by mouth daily.   RABEprazole  (ACIPHEX ) 20 MG tablet Take 1 tablet (20 mg total) by mouth daily.   rosuvastatin  (CRESTOR ) 40 MG tablet TAKE 1 TABLET BY MOUTH DAILY   traZODone  (DESYREL ) 100 MG tablet TAKE 2 TABLETS BY MOUTH AT BEDTIME   triamcinolone  cream (KENALOG ) 0.1 % Apply 1 Application topically 2 (two) times daily.   oxyCODONE  (ROXICODONE ) 15 MG immediate release tablet Take 1 tablet (15 mg total) by mouth every 4 (four) hours as needed for pain.   glucose blood (CONTOUR TEST) test strip Check blood sugars 3 times daily. E11.65 (uncontrolled insulin  dependent diabetes)   insulin  aspart (NOVOLOG  FLEXPEN) 100 UNIT/ML FlexPen Use before meals up to three times a day. Take 6 units if sugar is over 200. 8 units if over 250 and 10 units if over 300.   No facility-administered medications prior to visit.   Review of Systems  Constitutional:  Negative for appetite change, chills and fever.  Respiratory:  Negative for chest tightness, shortness of breath and wheezing.   Cardiovascular:  Negative for chest pain and palpitations.  Gastrointestinal:  Negative for abdominal pain, nausea and vomiting.      Objective    BP 116/68 (BP Location: Left Arm, Patient Position: Sitting, Cuff Size: Large)   Pulse 60   Resp 16   Ht 5' 8 (1.727 m)   Wt 218 lb 9.6 oz (99.2 kg)   SpO2 99%   BMI 33.24 kg/m   Physical Exam   General: Appearance:    Obese male in no acute distress  Eyes:    PERRL, conjunctiva/corneas clear, EOM's intact       Lungs:     Clear to auscultation bilaterally, respirations unlabored  Heart:    Normal heart rate. Normal rhythm. No murmurs, rubs, or gallops.    MS:   All extremities are intact.    Neurologic:   Awake, alert, oriented x 3. No apparent focal neurological defect.       A1c=7.3%    Assessment & Plan    1. Acute reaction to situational stress (Primary) Worse lately due to ill family  members. Refill  - ALPRAZolam  (XANAX ) 0.5 MG tablet; Take 1 tablet (0.5 mg total) by mouth daily as needed for anxiety.  Dispense: 20 tablet; Refill: 0  Counseled not take within 6 hours of his pain medication.   2. Type 2 diabetes mellitus without complication, with long-term current use of insulin  (HCC) A1c up a bit. Continue current medications.  Having a bit of nausea so will keep [name]  dose same. He admits to some dietary indiscretions that he is going to work on.  - Urine microalbumin-creatinine with uACR  3. Nausea Suspect this is related to stress and anxiety. No other GI sx and has been on current dose of [name]  for several months.   4. Hyperlipidemia associated with type 2 diabetes mellitus (HCC) He Is going to work on improving diet and will hold off on checking lipids until his next follow up appointment.   5. Other chronic postprocedural pain Reasonable well controlled on current medication regiment.   - Pain Mgt Scrn (14 Drugs), Ur  refill- oxyCODONE  (ROXICODONE ) 15 MG immediate release tablet; Take 1 tablet (15 mg total) by mouth every 4 (four) hours as needed for pain.  Dispense: 180 tablet; Refill: 0  6. Stopped smoking with greater than 20 pack year history  - Ambulatory Referral for Lung Cancer Screening [REF832]  7. Immunization due  - Flu vaccine trivalent PF, 6mos and older(Flulaval,Afluria,Fluarix,Fluzone)  Return in about 4 months (around 04/17/2024) for Diabetes.     Nancyann Perry, MD  [facility] [phone] (phone) [phone] (fax)  [facility]

## 2023-12-17 NOTE — Patient Instructions (Signed)
 SABRA  Please review the attached list of medications and notify my office if there are any errors.   . Please bring all of your medications to every appointment so we can make sure that our medication list is the same as yours.

## 2023-12-18 ENCOUNTER — Other Ambulatory Visit: Payer: Self-pay | Admitting: Family Medicine

## 2023-12-18 DIAGNOSIS — E1159 Type 2 diabetes mellitus with other circulatory complications: Secondary | ICD-10-CM

## 2023-12-18 LAB — MICROALBUMIN / CREATININE URINE RATIO
Creatinine, Urine: 41 mg/dL
Microalb/Creat Ratio: <7 mg/g{creat} (ref 0–29)
Microalbumin, Urine: <3 ug/mL

## 2023-12-19 ENCOUNTER — Ambulatory Visit: Admitting: Family Medicine

## 2023-12-19 LAB — PAIN MGT SCRN (14 DRUGS), UR
Amphetamine Scrn, Ur: NEGATIVE ng/mL
BARBITURATE SCREEN URINE: NEGATIVE ng/mL
BENZODIAZEPINE SCREEN, URINE: NEGATIVE ng/mL
Buprenorphine, Urine: NEGATIVE ng/mL
CANNABINOIDS UR QL SCN: NEGATIVE ng/mL
Cocaine (Metab) Scrn, Ur: NEGATIVE ng/mL
Creatinine(Crt), U: 43.8 mg/dL (ref 20.0–300.0)
Fentanyl, Urine: NEGATIVE pg/mL
Meperidine Screen, Urine: NEGATIVE ng/mL
Methadone Screen, Urine: NEGATIVE ng/mL
OXYCODONE+OXYMORPHONE UR QL SCN: POSITIVE ng/mL — AB
Opiate Scrn, Ur: POSITIVE ng/mL — AB
Ph of Urine: 5.4 (ref 4.5–8.9)
Phencyclidine Qn, Ur: NEGATIVE ng/mL
Propoxyphene Scrn, Ur: NEGATIVE ng/mL
Tramadol Screen, Urine: NEGATIVE ng/mL

## 2024-01-04 ENCOUNTER — Other Ambulatory Visit (HOSPITAL_COMMUNITY): Payer: Self-pay

## 2024-01-10 ENCOUNTER — Other Ambulatory Visit: Payer: Self-pay | Admitting: Family Medicine

## 2024-01-10 DIAGNOSIS — G8928 Other chronic postprocedural pain: Secondary | ICD-10-CM

## 2024-01-10 NOTE — Telephone Encounter (Signed)
 Copied from CRM #[id number]. Topic: Clinical - Medication Refill >> Jan 10, 2024  9:10 AM Carlatta H wrote: Medication: oxyCODONE  (ROXICODONE ) 15 MG immediate release tablet  Has the patient contacted their pharmacy? No (Agent: If no, request that the patient contact the pharmacy for the refill. If patient does not wish to contact the pharmacy document the reason why and proceed with request.) (Agent: If yes, when and what did the pharmacy advise?)  This is the patient's preferred pharmacy:  [facility] [id number] [place], [place] - [address] [place] [address] Phone: [phone] Fax: [phone]    Is this the correct pharmacy for this prescription? Yes If no, delete pharmacy and type the correct one.   Has the prescription been filled recently? No  Is the patient out of the medication? Yes  Has the patient been seen for an appointment in the last year OR does the patient have an upcoming appointment? Yes  Can we respond through MyChart? No  Agent: Please be advised that Rx refills may take up to 3 business days. We ask that you follow-up with your pharmacy.

## 2024-01-11 ENCOUNTER — Other Ambulatory Visit (HOSPITAL_COMMUNITY): Payer: Self-pay

## 2024-01-11 NOTE — Telephone Encounter (Signed)
 Requested medication (s) are due for refill today: routing for review  Requested medication (s) are on the active medication list: yes  Last refill:  12/14/23  Future visit scheduled: yes  Notes to clinic:  Unable to refill per protocol, cannot delegate.      Requested Prescriptions  Pending Prescriptions Disp Refills   oxyCODONE  (ROXICODONE ) 15 MG immediate release tablet 180 tablet 0    Sig: Take 1 tablet (15 mg total) by mouth every 4 (four) hours as needed for pain.     Not Delegated - Analgesics:  Opioid Agonists Failed - 01/11/2024  1:02 PM      Failed - This refill cannot be delegated      Failed - Urine Drug Screen completed in last 360 days      Passed - Valid encounter within last 3 months    Recent Outpatient Visits           3 weeks ago Acute reaction to situational stress   [facility] Nancy[facility] BRAVO, MD   5 months ago Type 2 diabetes mellitus without complication, with long-term current use of insulin  [facility])   [facility] Nancy[facility] BRAVO, [facility]   6 months ago Chronic, continuous use of opioids   [facility] Nancyann BRAVO, MD       Future Appointments             In 3 months Fisher, Nancyann BRAVO, MD [facility] [place]

## 2024-01-13 ENCOUNTER — Other Ambulatory Visit: Payer: Self-pay | Admitting: Family Medicine

## 2024-01-13 DIAGNOSIS — E1169 Type 2 diabetes mellitus with other specified complication: Secondary | ICD-10-CM

## 2024-01-14 ENCOUNTER — Other Ambulatory Visit: Payer: Self-pay | Admitting: Family Medicine

## 2024-01-14 ENCOUNTER — Other Ambulatory Visit: Payer: Self-pay

## 2024-01-14 ENCOUNTER — Telehealth: Payer: Self-pay | Admitting: Family Medicine

## 2024-01-14 DIAGNOSIS — G8928 Other chronic postprocedural pain: Secondary | ICD-10-CM

## 2024-01-14 DIAGNOSIS — F5101 Primary insomnia: Secondary | ICD-10-CM

## 2024-01-14 MED ORDER — OXYCODONE HCL 15 MG PO TABS
15.0000 mg | ORAL_TABLET | ORAL | 0 refills | Status: DC | PRN
  Filled 2024-01-14 (×2): qty 90, 15d supply, fill #0
  Filled 2024-01-14: qty 180, 30d supply, fill #0

## 2024-01-14 MED ORDER — OXYCODONE HCL 15 MG PO TABS: 15.0000 mg | ORAL_TABLET | ORAL | 0 refills | Status: DC | PRN

## 2024-01-14 NOTE — Telephone Encounter (Signed)
 Thiis patient is at my desk. [facility] will not give him oxycodone  because the insurance will not allow him to get them but 1 day early. Per pharmacist Adam at [facility], he was acting out there. Patient ask to pay for them out of pocket and due to patient behavior Juliene said no. He is having an episode where he is very down and acts very depressed right now to me. He is asking if you will send the rx to [facility] since [facility] wont fill them.

## 2024-01-14 NOTE — Telephone Encounter (Signed)
 Done

## 2024-01-14 NOTE — Telephone Encounter (Signed)
 error

## 2024-01-15 NOTE — Telephone Encounter (Signed)
 Devere at [facility] wanted you to know she cancelled the Oxycodone  RX since patient filled this medicine at [facility] on 01/14/24.

## 2024-02-07 ENCOUNTER — Telehealth: Payer: Self-pay | Admitting: Family Medicine

## 2024-02-07 ENCOUNTER — Other Ambulatory Visit: Payer: Self-pay | Admitting: Family Medicine

## 2024-02-07 ENCOUNTER — Other Ambulatory Visit: Payer: Self-pay

## 2024-02-07 DIAGNOSIS — F430 Acute stress reaction: Secondary | ICD-10-CM

## 2024-02-07 DIAGNOSIS — F1129 Opioid dependence with unspecified opioid-induced disorder: Secondary | ICD-10-CM

## 2024-02-07 DIAGNOSIS — G8928 Other chronic postprocedural pain: Secondary | ICD-10-CM

## 2024-02-07 NOTE — Telephone Encounter (Signed)
 [facility] faxed refill request for the following medications:  cyclobenzaprine  (FLEXERIL ) 5 MG tablet    Please advise.

## 2024-02-07 NOTE — Telephone Encounter (Signed)
 Converted into a refill request

## 2024-02-08 ENCOUNTER — Other Ambulatory Visit: Payer: Self-pay | Admitting: Family Medicine

## 2024-02-08 DIAGNOSIS — E1169 Type 2 diabetes mellitus with other specified complication: Secondary | ICD-10-CM

## 2024-02-08 MED ORDER — CYCLOBENZAPRINE HCL 5 MG PO TABS
5.0000 mg | ORAL_TABLET | Freq: Three times a day (TID) | ORAL | 2 refills | Status: AC | PRN
Start: 2024-02-08 — End: ?

## 2024-02-11 ENCOUNTER — Other Ambulatory Visit: Payer: Self-pay

## 2024-02-11 ENCOUNTER — Other Ambulatory Visit: Payer: Self-pay | Admitting: Family Medicine

## 2024-02-11 DIAGNOSIS — G8928 Other chronic postprocedural pain: Secondary | ICD-10-CM

## 2024-02-11 MED FILL — Oxycodone HCl Tab 15 MG: ORAL | 30 days supply | Qty: 180 | Fill #0 | Status: AC

## 2024-02-11 NOTE — Telephone Encounter (Unsigned)
 Copied from CRM [id number]. Topic: Clinical - Medication Refill >> Feb 11, 2024  9:52 AM Willma R wrote: Medication: oxyCODONE  (ROXICODONE ) 15 MG immediate release table  Has the patient contacted their pharmacy? Yes, call dr  This is the patient's preferred pharmacy:  [facility] [id number] [place], [place] - [address] [place] [address] Phone: [phone] Fax: [phone]  Is this the correct pharmacy for this prescription? Yes If no, delete pharmacy and type the correct one.   Has the prescription been filled recently? No  Is the patient out of the medication? No  Has the patient been seen for an appointment in the last year OR does the patient have an upcoming appointment? Yes  Can we respond through MyChart? Yes  Agent: Please be advised that Rx refills may take up to 3 business days. We ask that you follow-up with your pharmacy.

## 2024-02-12 NOTE — Telephone Encounter (Signed)
 Requested medication (s) are due for refill today: yes  Requested medication (s) are on the active medication list: yes  Last refill:  02/11/24  Future visit scheduled: yes  Notes to clinic:  Duplicate request, refilled 02/11/24. Unable to refuse     Requested Prescriptions  Pending Prescriptions Disp Refills   oxyCODONE  (ROXICODONE ) 15 MG immediate release tablet 180 tablet 0    Sig: Take 1 tablet (15 mg total) by mouth every 4 (four) hours as needed for pain.     Not Delegated - Analgesics:  Opioid Agonists Failed - 02/12/2024  3:47 PM      Failed - This refill cannot be delegated      Failed - Urine Drug Screen completed in last 360 days      Passed - Valid encounter within last 3 months    Recent Outpatient Visits           1 month ago Acute reaction to situational stress   [facility] Nancy[facility] BRAVO, [facility]   6 months ago Type 2 diabetes mellitus without complication, with long-term current use of insulin  [facility])   [facility] BRAVO, [facility]   7 months ago Chronic, continuous use of opioids   [facility] Nancy[facility] BRAVO, MD       Future Appointments             In 2 months Fisher, Nancyann BRAVO, MD [facility] [place]

## 2024-03-07 ENCOUNTER — Other Ambulatory Visit: Payer: Self-pay | Admitting: Family Medicine

## 2024-03-07 ENCOUNTER — Other Ambulatory Visit: Payer: Self-pay

## 2024-03-07 DIAGNOSIS — G8928 Other chronic postprocedural pain: Secondary | ICD-10-CM

## 2024-03-08 ENCOUNTER — Other Ambulatory Visit: Payer: Self-pay

## 2024-03-09 ENCOUNTER — Other Ambulatory Visit: Payer: Self-pay

## 2024-03-09 MED ORDER — OXYCODONE HCL 15 MG PO TABS
15.0000 mg | ORAL_TABLET | ORAL | 0 refills | Status: DC | PRN
  Filled 2024-03-09 – 2024-03-11 (×2): qty 180, 30d supply, fill #0

## 2024-03-10 ENCOUNTER — Other Ambulatory Visit: Payer: Self-pay

## 2024-03-11 ENCOUNTER — Other Ambulatory Visit: Payer: Self-pay

## 2024-03-27 DIAGNOSIS — R9431 Abnormal electrocardiogram [ECG] [EKG]: Secondary | ICD-10-CM | POA: Diagnosis not present

## 2024-03-27 DIAGNOSIS — I10 Essential (primary) hypertension: Secondary | ICD-10-CM | POA: Diagnosis not present

## 2024-03-27 DIAGNOSIS — R112 Nausea with vomiting, unspecified: Secondary | ICD-10-CM | POA: Diagnosis not present

## 2024-03-27 DIAGNOSIS — R197 Diarrhea, unspecified: Secondary | ICD-10-CM | POA: Diagnosis not present

## 2024-03-27 DIAGNOSIS — R109 Unspecified abdominal pain: Secondary | ICD-10-CM | POA: Diagnosis not present

## 2024-03-27 DIAGNOSIS — E785 Hyperlipidemia, unspecified: Secondary | ICD-10-CM | POA: Diagnosis not present

## 2024-03-27 DIAGNOSIS — F17210 Nicotine dependence, cigarettes, uncomplicated: Secondary | ICD-10-CM | POA: Diagnosis not present

## 2024-03-27 DIAGNOSIS — E119 Type 2 diabetes mellitus without complications: Secondary | ICD-10-CM | POA: Diagnosis not present

## 2024-03-27 DIAGNOSIS — A09 Infectious gastroenteritis and colitis, unspecified: Secondary | ICD-10-CM | POA: Diagnosis not present

## 2024-04-07 ENCOUNTER — Other Ambulatory Visit: Payer: Self-pay

## 2024-04-07 DIAGNOSIS — G8928 Other chronic postprocedural pain: Secondary | ICD-10-CM

## 2024-04-07 MED ORDER — OXYCODONE HCL 15 MG PO TABS
15.0000 mg | ORAL_TABLET | ORAL | 0 refills | Status: DC | PRN
  Filled 2024-04-07 – 2024-04-08 (×3): qty 180, 30d supply, fill #0

## 2024-04-08 ENCOUNTER — Other Ambulatory Visit: Payer: Self-pay

## 2024-04-08 ENCOUNTER — Other Ambulatory Visit: Payer: Self-pay | Admitting: Family Medicine

## 2024-04-10 ENCOUNTER — Other Ambulatory Visit: Payer: Self-pay | Admitting: Family Medicine

## 2024-04-10 DIAGNOSIS — F321 Major depressive disorder, single episode, moderate: Secondary | ICD-10-CM

## 2024-04-21 ENCOUNTER — Ambulatory Visit: Admitting: Family Medicine

## 2024-04-21 ENCOUNTER — Encounter: Payer: Self-pay | Admitting: Family Medicine

## 2024-04-21 ENCOUNTER — Other Ambulatory Visit: Payer: Self-pay

## 2024-04-21 VITALS — BP 123/54 | HR 52 | Resp 16 | Ht 66.0 in | Wt 215.0 lb

## 2024-04-21 DIAGNOSIS — E1169 Type 2 diabetes mellitus with other specified complication: Secondary | ICD-10-CM

## 2024-04-21 DIAGNOSIS — E1159 Type 2 diabetes mellitus with other circulatory complications: Secondary | ICD-10-CM

## 2024-04-21 DIAGNOSIS — Z122 Encounter for screening for malignant neoplasm of respiratory organs: Secondary | ICD-10-CM

## 2024-04-21 DIAGNOSIS — Z23 Encounter for immunization: Secondary | ICD-10-CM | POA: Diagnosis not present

## 2024-04-21 DIAGNOSIS — Z794 Long term (current) use of insulin: Secondary | ICD-10-CM | POA: Diagnosis not present

## 2024-04-21 DIAGNOSIS — F17210 Nicotine dependence, cigarettes, uncomplicated: Secondary | ICD-10-CM | POA: Diagnosis not present

## 2024-04-21 DIAGNOSIS — E559 Vitamin D deficiency, unspecified: Secondary | ICD-10-CM

## 2024-04-21 DIAGNOSIS — E785 Hyperlipidemia, unspecified: Secondary | ICD-10-CM | POA: Diagnosis not present

## 2024-04-21 DIAGNOSIS — I152 Hypertension secondary to endocrine disorders: Secondary | ICD-10-CM | POA: Diagnosis not present

## 2024-04-21 DIAGNOSIS — K5909 Other constipation: Secondary | ICD-10-CM | POA: Diagnosis not present

## 2024-04-21 MED ORDER — MOUNJARO 12.5 MG/0.5ML ~~LOC~~ SOAJ
12.5000 mg | SUBCUTANEOUS | 1 refills | Status: AC
  Filled 2024-04-21: qty 6, 84d supply, fill #0

## 2024-04-21 MED ORDER — TRULANCE 3 MG PO TABS
3.0000 mg | ORAL_TABLET | Freq: Every day | ORAL | 2 refills | Status: AC
  Filled 2024-04-21: qty 90, 90d supply, fill #0

## 2024-04-21 NOTE — Patient Instructions (Signed)
 SABRA  Please review the attached list of medications and notify my office if there are any errors.   . Please bring all of your medications to every appointment so we can make sure that our medication list is the same as yours.

## 2024-04-21 NOTE — Progress Notes (Signed)
 "     Established patient visit   Patient: Vincent Hall   DOB: Apr 20, 1965   59 y.o. Male  MRN: [id number] Visit Date: 04/21/2024  Today's healthcare provider: Nancyann Perry, MD   Chief Complaint  Patient presents with   Follow-up    4 month DM f/u.SABRA    Subjective    Discussed the use of AI scribe software for clinical note transcription with the patient, who gave verbal consent to proceed.  History of Present Illness   Vincent Hall Junior is a 59 year old male who presents for medication refills and follow-up.  He recently experienced an episode of colitis while in [place], which required hospitalization and treatment with two antibiotics. He completed the antibiotic course.  His diabetes is managed with [name]  and insulin , with blood sugar levels in the low hundreds and no episodes of hypoglycemia. He uses 45 units of insulin .  He is not currently using Repatha , which was previously prescribed by another doctor, but continues to take Zetia  and Crestor  for cholesterol management.  He requires a refill for Trulance , which was switched from Linzess  due to insurance coverage, and has been out of this medication for a while.     Lab Results  Component Value Date   HGBA1C 7.3 (A) 12/17/2023   HGBA1C 6.8 (H) 07/27/2023   HGBA1C 6.9 (A) 03/02/2023   Lab Results  Component Value Date   NA 138 07/27/2023   K 4.4 07/27/2023   CREATININE 1.12 07/27/2023   EGFR 76 07/27/2023   GLUCOSE 146 (H) 07/27/2023   Lab Results  Component Value Date   CHOL 74 (L) 07/27/2023   HDL 47 07/27/2023   LDLCALC 4 07/27/2023   TRIG 134 07/27/2023   CHOLHDL 1.6 07/27/2023   Wt Readings from Last 3 Encounters:  04/21/24 215 lb (97.5 kg)  12/17/23 218 lb 9.6 oz (99.2 kg)  07/27/23 222 lb (100.7 kg)     Medications: Show/hide medication list[1] Review of Systems  Constitutional:  Negative for appetite change, chills and fever.  Respiratory:  Negative for chest tightness,  shortness of breath and wheezing.   Cardiovascular:  Negative for chest pain and palpitations.  Gastrointestinal:  Negative for abdominal pain, nausea and vomiting.       Objective    BP (!) 123/54 (BP Location: Left Arm, Patient Position: Sitting, Cuff Size: Normal)   Pulse (!) 52   Resp 16   Ht 5' 6 (1.676 m)   Wt 215 lb (97.5 kg)   SpO2 100%   BMI 34.70 kg/m   Physical Exam   General: Appearance:    Mildly obese male in no acute distress  Eyes:    PERRL, conjunctiva/corneas clear, EOM's intact       Lungs:     Clear to auscultation bilaterally, respirations unlabored  Heart:    Bradycardic. Normal rhythm. No murmurs, rubs, or gallops.    MS:   All extremities are intact.    Neurologic:   Awake, alert, oriented x 3. No apparent focal neurological defect.        Assessment & Plan    1. Hypertension associated with diabetes (HCC) (Primary)  - Hemoglobin A1c - Comprehensive metabolic panel with GFR - tirzepatide  ([name] ) 12.5 MG/0.5ML Pen; Inject 12.5 mg into the skin once a week.  Dispense: 6 mL; Refill: 1  2. Hyperlipidemia associated with type 2 diabetes mellitus (HCC) He is tolerating rosuvastatin  and ezetimibe  well with no adverse effects, out of Repatha  for  6 months.  - Lipid Panel With LDL/HDL Ratio  3. Vitamin D  deficiency  - VITAMIN D  25 Hydroxy (Vit-D Deficiency, Fractures)  4. Chronic constipation  - Plecanatide  (TRULANCE ) 3 MG TABS; Take 1 tablet (3 mg total) by mouth daily.  Dispense: 90 tablet; Refill: 2  5. Need for pneumococcal 20-valent conjugate vaccination  - Pneumococcal conjugate vaccine 20-valent (Prevnar 20)  6. Smoking greater than 20 pack years  - Ambulatory Referral for Lung Cancer Screening [REF832]  Return in about 4 months (around 08/19/2024).      Nancyann Perry, MD  [facility] [phone] (phone) [phone] (fax)  [facility]    [1]  Outpatient Medications Prior to Visit   Medication Sig   albuterol  (VENTOLIN  HFA) 108 (90 Base) MCG/ACT inhaler Inhale 2 puffs into the lungs every 6 (six) hours as needed for wheezing or shortness of breath.   ALPRAZolam  (XANAX ) 0.5 MG tablet TAKE 1 TABLET BY MOUTH DAILY AS NEEDED FOR ANXIETY   Alum & Mag Hydroxide-Simeth (GI COCKTAIL) SUSP suspension Take 30 mLs by mouth 2 (two) times daily as needed for indigestion (abd pain). Shake well. Each dose to containe 15mL maalox and 15mL viscous lidocaine    aspirin  81 MG tablet Take 81 mg by mouth daily.   azelastine  (OPTIVAR ) 0.05 % ophthalmic solution INSTILL ONE DROP INTO THE AFFECTED EYE(S) TWICE DAILY   Blood Glucose Monitoring Suppl (ONE TOUCH ULTRA 2) w/Device KIT USE AS DIRECTED   citalopram  (CELEXA ) 40 MG tablet TAKE 1 TABLET BY MOUTH DAILY   cyclobenzaprine  (FLEXERIL ) 5 MG tablet Take 1 tablet (5 mg total) by mouth 3 (three) times daily as needed for muscle spasms.   diclofenac  (VOLTAREN ) 75 MG EC tablet TAKE 1 TABLET BY MOUTH 2 TIMES A DAY AS NEEDED FOR MODERATE PAIN.   ezetimibe  (ZETIA ) 10 MG tablet Take 1 tablet (10 mg total) by mouth daily.   glipiZIDE  (GLUCOTROL  XL) 10 MG 24 hr tablet TAKE 1 TABLET BY MOUTH 2 TIMES A DAY BEFORE LUNCH AND SUPPER   glucose blood (CONTOUR TEST) test strip Check blood sugars 3 times daily. E11.65 (uncontrolled insulin  dependent diabetes)   insulin  aspart (NOVOLOG  FLEXPEN) 100 UNIT/ML FlexPen Use before meals up to three times a day. Take 6 units if sugar is over 200. 8 units if over 250 and 10 units if over 300.   insulin  glargine, 2 Unit Dial , (TOUJEO  MAX SOLOSTAR) 300 UNIT/ML Solostar Pen INJECT 40 UNITS INTO THE SKIN DAILY (Patient taking differently: Inject 45 Units into the skin daily.)   Insulin  Pen Needle 32G X 4 MM MISC 1 each by Does not apply route 4 (four) times daily -  before meals and at bedtime.   lisinopril  (ZESTRIL ) 20 MG tablet TAKE 1 TABLET BY MOUTH DAILY   naloxone  (NARCAN ) nasal spray 4 mg/0.1 mL Place 1 spray into the nose  as needed.   nitroGLYCERIN  (NITROSTAT ) 0.4 MG SL tablet DISSOLVE 1 TAB UNDER TONGUE FOR CHEST PAIN - IF PAIN REMAINS AFTER 5 MIN, CALL 911 AND REPEAT DOSE. MAX 3 TABS IN 15 MINUTES   oxyCODONE  (ROXICODONE ) 15 MG immediate release tablet Take 1 tablet (15 mg total) by mouth every 4 (four) hours as needed for pain.   pantoprazole  (PROTONIX ) 40 MG tablet TAKE 1 TABLET BY MOUTH DAILY   pioglitazone  (ACTOS ) 30 MG tablet TAKE 1 TABLET BY MOUTH DAILY   RABEprazole  (ACIPHEX ) 20 MG tablet Take 1 tablet (20 mg total) by mouth daily.  rosuvastatin  (CRESTOR ) 40 MG tablet TAKE 1 TABLET BY MOUTH DAILY   traZODone  (DESYREL ) 100 MG tablet TAKE 2 TABLETS BY MOUTH AT BEDTIME   triamcinolone  cream (KENALOG ) 0.1 % Apply 1 Application topically 2 (two) times daily.   [DISCONTINUED] [name]  12.5 MG/0.5ML Pen INJECT 12.5 MG UNDER THE SKIN ONCE WEEKLY   Evolocumab  (REPATHA ) 140 MG/ML SOSY Inject 140 mg into the skin every 14 (fourteen) days. (Patient not taking: Reported on 04/21/2024)   [DISCONTINUED] Plecanatide  (TRULANCE ) 3 MG TABS Take 1 tablet (3 mg total) by mouth daily. (Patient not taking: Reported on 04/21/2024)   No facility-administered medications prior to visit.   "

## 2024-04-22 ENCOUNTER — Ambulatory Visit: Payer: Self-pay | Admitting: Family Medicine

## 2024-04-22 DIAGNOSIS — E1165 Type 2 diabetes mellitus with hyperglycemia: Secondary | ICD-10-CM

## 2024-04-22 DIAGNOSIS — E1169 Type 2 diabetes mellitus with other specified complication: Secondary | ICD-10-CM

## 2024-04-22 LAB — LIPID PANEL WITH LDL/HDL RATIO
Cholesterol, Total: 148 mg/dL (ref 100–199)
HDL: 46 mg/dL
LDL Chol Calc (NIH): 78 mg/dL (ref 0–99)
LDL/HDL Ratio: 1.7 ratio (ref 0.0–3.6)
Triglycerides: 138 mg/dL (ref 0–149)
VLDL Cholesterol Cal: 24 mg/dL (ref 5–40)

## 2024-04-22 LAB — COMPREHENSIVE METABOLIC PANEL WITH GFR
ALT: 20 IU/L (ref 0–44)
AST: 16 IU/L (ref 0–40)
Albumin: 4.3 g/dL (ref 3.8–4.9)
Alkaline Phosphatase: 53 IU/L (ref 47–123)
BUN/Creatinine Ratio: 10 (ref 10–24)
BUN: 9 mg/dL (ref 8–27)
Bilirubin Total: 0.2 mg/dL (ref 0.0–1.2)
CO2: 25 mmol/L (ref 20–29)
Calcium: 9.7 mg/dL (ref 8.6–10.2)
Chloride: 100 mmol/L (ref 96–106)
Creatinine, Ser: 0.9 mg/dL (ref 0.76–1.27)
Globulin, Total: 2.5 g/dL (ref 1.5–4.5)
Glucose: 152 mg/dL — ABNORMAL HIGH (ref 70–99)
Potassium: 4.9 mmol/L (ref 3.5–5.2)
Sodium: 139 mmol/L (ref 134–144)
Total Protein: 6.8 g/dL (ref 6.0–8.5)
eGFR: 98 mL/min/1.73

## 2024-04-22 LAB — HEMOGLOBIN A1C
Est. average glucose Bld gHb Est-mCnc: 232 mg/dL
Hgb A1c MFr Bld: 9.7 % — ABNORMAL HIGH (ref 4.8–5.6)

## 2024-04-22 LAB — VITAMIN D 25 HYDROXY (VIT D DEFICIENCY, FRACTURES): Vit D, 25-Hydroxy: 30.1 ng/mL (ref 30.0–100.0)

## 2024-04-22 MED ORDER — TOUJEO MAX SOLOSTAR 300 UNIT/ML ~~LOC~~ SOPN: 52.0000 [IU] | PEN_INJECTOR | Freq: Every day | SUBCUTANEOUS | Status: DC

## 2024-04-22 MED ORDER — REPATHA 140 MG/ML ~~LOC~~ SOSY: 140.0000 mg | PREFILLED_SYRINGE | SUBCUTANEOUS | 11 refills | Status: AC

## 2024-05-05 ENCOUNTER — Other Ambulatory Visit: Payer: Self-pay

## 2024-05-05 ENCOUNTER — Other Ambulatory Visit: Payer: Self-pay | Admitting: Family Medicine

## 2024-05-05 DIAGNOSIS — G8928 Other chronic postprocedural pain: Secondary | ICD-10-CM

## 2024-05-06 ENCOUNTER — Other Ambulatory Visit: Payer: Self-pay

## 2024-05-06 MED ORDER — OXYCODONE HCL 15 MG PO TABS
15.0000 mg | ORAL_TABLET | ORAL | 0 refills | Status: AC | PRN
  Filled 2024-05-06: qty 180, 30d supply, fill #0

## 2024-05-08 ENCOUNTER — Other Ambulatory Visit: Payer: Self-pay | Admitting: Family Medicine

## 2024-05-08 DIAGNOSIS — E1165 Type 2 diabetes mellitus with hyperglycemia: Secondary | ICD-10-CM

## 2024-05-08 DIAGNOSIS — E1169 Type 2 diabetes mellitus with other specified complication: Secondary | ICD-10-CM

## 2024-05-12 ENCOUNTER — Telehealth: Payer: Self-pay | Admitting: Pharmacy Technician

## 2024-05-12 ENCOUNTER — Other Ambulatory Visit (HOSPITAL_COMMUNITY): Payer: Self-pay

## 2024-05-12 NOTE — Telephone Encounter (Signed)
 Pharmacy Patient Advocate Encounter   Received notification from [facility] KEY that prior authorization for Repatha  140MG /ML syringes is required/requested.   Insurance verification completed.   The patient is insured through [facility].   Per test claim: PA required; PA started via CoverMyMeds. KEY [id number] . Waiting for clinical questions to populate.

## 2024-05-14 ENCOUNTER — Other Ambulatory Visit (HOSPITAL_COMMUNITY): Payer: Self-pay

## 2024-05-14 NOTE — Telephone Encounter (Signed)
 Pharmacy Patient Advocate Encounter  Received notification from [facility] that Prior Authorization for Repatha  140MG /ML syringes has been APPROVED from 05/13/24 to 05/12/27. Ran test claim, Copay is $34.99. This test claim was processed through [facility]- copay amounts may vary at other pharmacies due to pharmacy/plan contracts, or as the patient moves through the different stages of their insurance plan.   PA #/Case ID/Reference #: [id number]

## 2024-08-20 ENCOUNTER — Ambulatory Visit: Admitting: Family Medicine
